# Patient Record
Sex: Female | Born: 1940
Health system: Southern US, Community
[De-identification: ages and names within clinical notes are randomized; demographics above are authoritative.]

## PROBLEM LIST (undated history)

## (undated) DIAGNOSIS — I495 Sick sinus syndrome: Secondary | ICD-10-CM

## (undated) DIAGNOSIS — I5032 Chronic diastolic (congestive) heart failure: Secondary | ICD-10-CM

## (undated) DIAGNOSIS — I10 Essential (primary) hypertension: Secondary | ICD-10-CM

## (undated) DIAGNOSIS — E669 Obesity, unspecified: Secondary | ICD-10-CM

## (undated) DIAGNOSIS — F419 Anxiety disorder, unspecified: Secondary | ICD-10-CM

## (undated) DIAGNOSIS — J45909 Unspecified asthma, uncomplicated: Secondary | ICD-10-CM

## (undated) DIAGNOSIS — E119 Type 2 diabetes mellitus without complications: Secondary | ICD-10-CM

## (undated) DIAGNOSIS — M1990 Unspecified osteoarthritis, unspecified site: Secondary | ICD-10-CM

## (undated) DIAGNOSIS — J42 Unspecified chronic bronchitis: Secondary | ICD-10-CM

## (undated) DIAGNOSIS — J189 Pneumonia, unspecified organism: Secondary | ICD-10-CM

## (undated) DIAGNOSIS — Z9981 Dependence on supplemental oxygen: Secondary | ICD-10-CM

## (undated) DIAGNOSIS — N182 Chronic kidney disease, stage 2 (mild): Secondary | ICD-10-CM

## (undated) DIAGNOSIS — I2510 Atherosclerotic heart disease of native coronary artery without angina pectoris: Secondary | ICD-10-CM

## (undated) DIAGNOSIS — I739 Peripheral vascular disease, unspecified: Secondary | ICD-10-CM

## (undated) DIAGNOSIS — K219 Gastro-esophageal reflux disease without esophagitis: Secondary | ICD-10-CM

## (undated) DIAGNOSIS — Z950 Presence of cardiac pacemaker: Secondary | ICD-10-CM

## (undated) DIAGNOSIS — I6529 Occlusion and stenosis of unspecified carotid artery: Secondary | ICD-10-CM

## (undated) DIAGNOSIS — I480 Paroxysmal atrial fibrillation: Secondary | ICD-10-CM

## (undated) DIAGNOSIS — E785 Hyperlipidemia, unspecified: Secondary | ICD-10-CM

## (undated) DIAGNOSIS — J449 Chronic obstructive pulmonary disease, unspecified: Secondary | ICD-10-CM

## (undated) DIAGNOSIS — R519 Headache, unspecified: Secondary | ICD-10-CM

## (undated) DIAGNOSIS — R51 Headache: Secondary | ICD-10-CM

## (undated) HISTORY — DX: Chronic kidney disease, stage 2 (mild): N18.2

## (undated) HISTORY — DX: Sick sinus syndrome: I49.5

## (undated) HISTORY — DX: Paroxysmal atrial fibrillation: I48.0

## (undated) HISTORY — DX: Type 2 diabetes mellitus without complications: E11.9

## (undated) HISTORY — DX: Atherosclerotic heart disease of native coronary artery without angina pectoris: I25.10

## (undated) HISTORY — DX: Essential (primary) hypertension: I10

## (undated) HISTORY — DX: Occlusion and stenosis of unspecified carotid artery: I65.29

## (undated) HISTORY — DX: Hyperlipidemia, unspecified: E78.5

## (undated) HISTORY — DX: Unspecified asthma, uncomplicated: J45.909

## (undated) HISTORY — DX: Unspecified osteoarthritis, unspecified site: M19.90

## (undated) HISTORY — DX: Peripheral vascular disease, unspecified: I73.9

## (undated) HISTORY — DX: Chronic diastolic (congestive) heart failure: I50.32

## (undated) HISTORY — DX: Obesity, unspecified: E66.9

---

## 2006-07-05 HISTORY — PX: CATARACT EXTRACTION W/ INTRAOCULAR LENS  IMPLANT, BILATERAL: SHX1307

## 2006-09-22 ENCOUNTER — Ambulatory Visit (HOSPITAL_COMMUNITY): Admission: RE | Admit: 2006-09-22 | Discharge: 2006-09-22 | Payer: Self-pay | Admitting: Ophthalmology

## 2006-10-20 ENCOUNTER — Ambulatory Visit (HOSPITAL_COMMUNITY): Admission: RE | Admit: 2006-10-20 | Discharge: 2006-10-20 | Payer: Self-pay | Admitting: Ophthalmology

## 2008-08-27 ENCOUNTER — Inpatient Hospital Stay (HOSPITAL_COMMUNITY): Admission: AD | Admit: 2008-08-27 | Discharge: 2008-08-28 | Payer: Self-pay | Admitting: Cardiology

## 2008-08-27 ENCOUNTER — Ambulatory Visit: Payer: Self-pay | Admitting: Cardiology

## 2008-08-27 ENCOUNTER — Ambulatory Visit: Payer: Self-pay | Admitting: Cardiovascular Disease

## 2008-08-28 ENCOUNTER — Encounter: Payer: Self-pay | Admitting: Cardiology

## 2008-09-10 ENCOUNTER — Ambulatory Visit: Payer: Self-pay | Admitting: Cardiology

## 2008-09-10 ENCOUNTER — Encounter: Payer: Self-pay | Admitting: Cardiology

## 2008-09-10 DIAGNOSIS — E782 Mixed hyperlipidemia: Secondary | ICD-10-CM | POA: Insufficient documentation

## 2008-09-10 DIAGNOSIS — I2510 Atherosclerotic heart disease of native coronary artery without angina pectoris: Secondary | ICD-10-CM

## 2009-03-27 ENCOUNTER — Encounter: Payer: Self-pay | Admitting: Cardiology

## 2010-10-20 LAB — BASIC METABOLIC PANEL
CO2: 26 mEq/L (ref 19–32)
Calcium: 9.5 mg/dL (ref 8.4–10.5)
Chloride: 101 mEq/L (ref 96–112)
Creatinine, Ser: 1.06 mg/dL (ref 0.4–1.2)
Glucose, Bld: 68 mg/dL — ABNORMAL LOW (ref 70–99)
Sodium: 139 mEq/L (ref 135–145)

## 2010-10-20 LAB — CARDIAC PANEL(CRET KIN+CKTOT+MB+TROPI)
CK, MB: 3.1 ng/mL (ref 0.3–4.0)
CK, MB: 3.2 ng/mL (ref 0.3–4.0)
Relative Index: 1.9 (ref 0.0–2.5)
Total CK: 144 U/L (ref 7–177)
Total CK: 160 U/L (ref 7–177)

## 2010-10-20 LAB — GLUCOSE, CAPILLARY
Glucose-Capillary: 266 mg/dL — ABNORMAL HIGH (ref 70–99)
Glucose-Capillary: 80 mg/dL (ref 70–99)
Glucose-Capillary: 83 mg/dL (ref 70–99)
Glucose-Capillary: 95 mg/dL (ref 70–99)

## 2010-10-20 LAB — CBC
Hemoglobin: 12.3 g/dL (ref 12.0–15.0)
MCHC: 34.4 g/dL (ref 30.0–36.0)
RBC: 4.07 MIL/uL (ref 3.87–5.11)
WBC: 6.8 10*3/uL (ref 4.0–10.5)

## 2010-10-20 LAB — LIPID PANEL
Triglycerides: 195 mg/dL — ABNORMAL HIGH (ref <150)
VLDL: 39 mg/dL (ref 0–40)

## 2010-10-20 LAB — PROTIME-INR
INR: 1 (ref 0.00–1.49)
Prothrombin Time: 13.9 seconds (ref 11.6–15.2)

## 2010-11-17 NOTE — Discharge Summary (Signed)
NAME:  Miranda Sexton, LEGLER NO.:  [id number]   MEDICAL RECORD NO.:  [id number]          PATIENT TYPE:  INP   LOCATION:  3729                         FACILITY:  [facility]   PHYSICIAN:  Luis Abed, MD, FACCDATE OF BIRTH:  1940-08-20   DATE OF ADMISSION:  08/27/2008  DATE OF DISCHARGE:  08/28/2008                               DISCHARGE SUMMARY   PRIMARY CARDIOLOGIST:  Jonelle Sidle, MD   PRIMARY MEDICAL DOCTOR:  Doreen Beam, MD.   DISCHARGE DIAGNOSIS:  Nonobstructive coronary artery disease.   SECONDARY DIAGNOSES:  1. Dyslipidemia.  2. Hypertension.  3. Diabetes mellitus.   ALLERGIES:  NKDA.   PROCEDURES PERFORMED DURING THIS HOSPITALIZATION:  The patient had an  EKG on August 27, 2008, which shows sinus bradycardia with a rate of  45 beats per minute, some T-wave flattening in V1, V2, lead I, and aVL.  No significant Q-waves.  No evidence of hypertrophy, left axis  deviation, and 1 premature ventricular complex.  PR 176, QRS 102, and  QTc 407.  An EKG on August 28, 2008, which shows sinus bradycardia  with a rate of 46 beats per minute.  No acute ST-T wave changes.  No  significant Q-waves.  No evidence of hypertrophy, left axis deviation.  PR 174, QRS 122, and QTc 432.  The patient had cardiac catheterization  on August 28, 2008, that showed:  1. Nonobstructive LAD stenosis.  2. No significant left circumflex or right coronary artery stenosis.  3. Normal left ventricular systolic function.  4. Moderate right renal artery stenosis.  5. A 2-D echocardiogram completed on August 28, 2008, results      pending (please see addendum).   BRIEF HISTORY OF PRESENT ILLNESS:  The patient is a pleasant 70 year old  African American female with a 15-year history of diabetes mellitus and  hypertension, but no known history of cardiovascular disease.  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  She states she has  been more fatigued with  activity over the last several weeks.  Last  Friday, August 23, 2008, she began to experience a dull ache in her  left chest associated with burning in her mid sternal area with  radiation up to her left neck and left arm, onset with activity.  She  reported this to be moderate in intensity and caused her to stop  sweeping and sit down.  She ultimately took one of her husband's  sublingual nitroglycerin tablets with relief.  These symptoms lasted  approximately 15 minutes.  Since that time, she has had recurring less  severe episode of similar discomfort, again with activity, and she  scheduled a visit to see Dr. Sherril Croon today.  She was subsequently admitted  to [facility] for evaluation.  Cardiology was consulted, and she  reported that these symptoms are clearly different since last Friday  from prior symptoms she has experienced.  Her activity has been limited.  At the time consultation, her cardiac markers showed an elevated total  CK of 194 with normal CK-MB and relative index and a normal troponin of  less than 0.01.  At the time of consultation, she had not undergone any  recent ischemic evaluation.  So, the patient was transferred to [facility] for cardiac catheterization.  The patient tolerated the  procedure well without any significant complications (please see results  above).  The patient's symptoms have improved somewhat and has been  deemed stable enough for discharge on August 28, 2008.  The patient  also had a 2-D echocardiogram completed at that time of this discharge  secondary to new murmur found on exam; however, due to normal LV  function found on cardiac catheterization, the patient will be  discharged and follow up with Dr. Diona Browner as an outpatient for results  of 2-D echocardiogram.  The patient vital signs remained stable during  the hospital stay.  Most recent vital signs at the time of discharge  with temp 98.5 degrees Fahrenheit, BP 139/70,  pulse 56, respirations 18,  and O2 saturation 97% on room air.  At the time of discharge, the  patient had followup instructions both post cath instructions and  medications instructions given to her in both oral and written form and  she had no questions or concerns that were not addressed.   DISCHARGE LABORATORY DATA:  WBC 6.8, HGB 12.3, HCT 35.7, and platelet  count 327.  Protime 13.9 and INR 1.2.  Sodium 139, potassium 4.0,  chloride 101, CO2 of 26, BUN 16, creatinine 1.06, glucose 68 (patient  asymptomatic).  The patient had 3 sets of negative cardiac enzymes.  Total cholesterol 206, triglycerides 195, HDL 32, LDL 135, and VLDL 39.   FOLLOWUP PLANS AND APPOINTMENTS:  1. The patient was informed to see Dr. Nona Dell, on September 10, 2008, at 2 p.m.  2. The patient has been instructed to follow up with her primary care      doctor, Dr. Sherril Croon, within 2 weeks.   DISCHARGE MEDICATIONS:  No change was made to the patient's medications  taken prior to admission except for the addition of simvastatin 20 mg  p.o. daily.   DURATION OF DISCHARGE ENCOUNTER INCLUDING PHYSICIAN TIME:  45 minutes.      Jarrett Ables, [facility]      Luis Abed, MD, [facility]  Electronically Signed    MS/MEDQ  D:  08/28/2008  T:  08/29/2008  Job:  [id number]   cc:   Jonelle Sidle, MD  Doreen Beam, MD

## 2010-11-20 NOTE — Op Note (Signed)
NAME:  Miranda Sexton, Miranda Sexton NO.:  [id number]   MEDICAL RECORD NO.:  [id number]          PATIENT TYPE:  AMB   LOCATION:  DAY                           FACILITY:  [facility]   PHYSICIAN:  Susanne Greenhouse, MD       DATE OF BIRTH:  01/12/1941   DATE OF PROCEDURE:  10/20/2006  DATE OF DISCHARGE:  10/20/2006                               OPERATIVE REPORT   PREOPERATIVE DIAGNOSIS:  Combined cataract, right eye.   POSTOPERATIVE DIAGNOSIS:  Combined cataract, right eye.   OPERATION PERFORMED:  Phacoemulsification intraocular lens implantation,  right eye.   SURGEON:  Susanne Greenhouse, MD   ANESTHESIA:  Topical with monitored anesthesia care.   OPERATIVE SUMMARY:  In the preoperative holding area, dilating drops and  2% viscous Lidocaine were placed into the right eye.  The patient was  then brought to the operating room where the eye was prepped and draped.  A super sharp blade was used to make a paracentesis port at the  surgeon's 2 o'clock position.  The anterior chamber was then filled with  1% non-preservative Lidocaine solution followed by Amvisc Plus.  A 2.85  mm keratome blade was used to make a clear corneal incision at the  superotemporal limbus.  A cystotome needle was used to create a continue  tear capsulotomy.  Hydrodissection was performed using balanced salt  solution on a fine cannula.  The lens nucleus was then removed using  phacoemulsification with the quadrant cracking technique.  Residual  cortex was removed with irrigation and aspiration.  The capsular bag and  anterior chamber were refilled with Amvisc Plus.  The anterior chamber  intraocular lens was placed into the capsular bag using AMO lens  inserter.  Upon delivering of the leading haptic, the leading haptic was  kinked at its insertion and the trailing haptic was amputated.  At this  point, the incision was widened to approximately 5 mm.  The one haptic  was retrieved and pulled through the wound and the  implant removed from  the anterior chamber through the enlarged wound.  A second implant was  then placed into the capsular bag without difficulty using manual  folding.  The Amvisc Plus was then removed from the anterior chamber and  capsular bag using irrigation and aspiration.  Three individual 10-0  nylon sutures were used to close the temporal incision.  The knots were  rotated to be buried.  Stromal hydration of the wound and paracentesis  ports were performed with balanced salt solution on a fine cannula.  The  wound was tested for leak, which was negative.  There were no operative  complications.  The patient tolerated the procedure well and is returned  to the recovery area in satisfactory condition.   PROSTHETIC DEVICE:  AMO TECNIS posterior chamber lens model [id number],  power of 20.0, serial number [id number].           ______________________________  Susanne Greenhouse, MD     KEH/MEDQ  D:  11/24/2006  T:  11/24/2006  Job:  [id number]

## 2012-07-05 HISTORY — PX: CARDIAC CATHETERIZATION: SHX172

## 2012-10-01 ENCOUNTER — Encounter: Payer: Self-pay | Admitting: Physician Assistant

## 2012-10-02 ENCOUNTER — Encounter: Payer: Self-pay | Admitting: Physician Assistant

## 2012-10-02 DIAGNOSIS — I5031 Acute diastolic (congestive) heart failure: Secondary | ICD-10-CM

## 2012-10-02 DIAGNOSIS — R079 Chest pain, unspecified: Secondary | ICD-10-CM

## 2012-10-06 ENCOUNTER — Encounter: Payer: Self-pay | Admitting: Cardiology

## 2012-10-19 ENCOUNTER — Encounter: Payer: Self-pay | Admitting: Physician Assistant

## 2012-10-26 ENCOUNTER — Encounter: Payer: Self-pay | Admitting: Physician Assistant

## 2012-10-26 ENCOUNTER — Ambulatory Visit (INDEPENDENT_AMBULATORY_CARE_PROVIDER_SITE_OTHER): Payer: No Typology Code available for payment source | Admitting: Physician Assistant

## 2012-10-26 ENCOUNTER — Encounter: Payer: Self-pay | Admitting: *Deleted

## 2012-10-26 VITALS — BP 145/74 | HR 62 | Ht 69.0 in | Wt 238.0 lb

## 2012-10-26 DIAGNOSIS — R0989 Other specified symptoms and signs involving the circulatory and respiratory systems: Secondary | ICD-10-CM | POA: Insufficient documentation

## 2012-10-26 DIAGNOSIS — I10 Essential (primary) hypertension: Secondary | ICD-10-CM | POA: Insufficient documentation

## 2012-10-26 DIAGNOSIS — I5032 Chronic diastolic (congestive) heart failure: Secondary | ICD-10-CM

## 2012-10-26 MED ORDER — ASPIRIN EC 81 MG PO TBEC: 81.0000 mg | DELAYED_RELEASE_TABLET | Freq: Every day | ORAL | Status: DC

## 2012-10-26 MED ORDER — FUROSEMIDE 40 MG PO TABS: 40.0000 mg | ORAL_TABLET | Freq: Every day | ORAL | Status: DC

## 2012-10-26 NOTE — Assessment & Plan Note (Signed)
Followed by primary M.D. Recommended target LDL 100 or less, if feasible.

## 2012-10-26 NOTE — Assessment & Plan Note (Signed)
With evaluate further with carotid Dopplers

## 2012-10-26 NOTE — Assessment & Plan Note (Addendum)
Will proceed with an exercise stress Cardiolite to rule out occult ischemia, as recommended. Patient ruled out for MI with NL troponins. Decrease ASA to 81 mg daily.

## 2012-10-26 NOTE — Progress Notes (Addendum)
Primary Cardiologist: Simona Huh, MD    HPI: Post hospital followup from [facility], status post presentation with CP and mild, acute DHF. Troponins NL. She presented with history of nonobstructive CAD, by prior cardiac catheterization in 2010. An echocardiogram was obtained, indicating normal LVF (EF 60-65%), with diastolic dysfunction, mild MR, and moderate PHTN (RVSP 55-60 mmHg).  Following this result, we recommended further evaluation as an outpatient with a stress test; however, this was never arranged.  Clinically, she reports significant overall improvement with much less dyspnea, and no further PND or orthopnea. She also reports significant decrease in her peripheral edema. Of note, she was discharged on a seven-day course of Lasix 40 mg daily, which has not been refilled. She monitors her weight daily and refrains from added salt. Her weight has remained essentially stable.   Allergies  Allergen Reactions  . Codeine     REACTION: nausea  . Penicillins     REACTION: rash    Current Outpatient Prescriptions  Medication Sig Dispense Refill  . benazepril (LOTENSIN) 40 MG tablet Take 40 mg by mouth daily.      Marland Kitchen glipiZIDE (GLUCOTROL XL) 10 MG 24 hr tablet Take 10 mg by mouth daily.      . insulin aspart protamine- aspart (NOVOLOG 70/30) (70-30) 100 UNIT/ML injection Inject 24-38 Units into the skin 2 (two) times daily with a meal. 38 units in AM and 24 units in PM      . metFORMIN (GLUCOPHAGE) 1000 MG tablet Take 1,000 mg by mouth 2 (two) times daily with a meal.      . omeprazole (PRILOSEC) 20 MG capsule Take 20 mg by mouth daily.      . potassium chloride SA (K-DUR,KLOR-CON) 20 MEQ tablet Take 20 mEq by mouth daily.      . simvastatin (ZOCOR) 40 MG tablet Take 40 mg by mouth every evening.      . triamterene-hydrochlorothiazide (DYAZIDE) 37.5-25 MG per capsule Take 1 capsule by mouth every morning.      . venlafaxine XR (EFFEXOR-XR) 150 MG 24 hr capsule Take 150 mg by mouth daily.        No current facility-administered medications for this visit.    Past Medical History  Diagnosis Date  . CAD (coronary artery disease)     Nonobstructive. Cardiac Cath 08/2008  . PAD (peripheral artery disease)     Moderate right renal artery stenosis 08/2008  . DM (diabetes mellitus)   . HTN (hypertension)   . HLD (hyperlipidemia)   . Obesity   . Asthma   . DJD (degenerative joint disease)   . Atypical chest pain     Normal troponins  . Acute diastolic heart failure   . Sinus bradycardia     first degree atrioventricular block  . Chronic kidney disease     No past surgical history on file.  History   Social History  . Marital Status: Married    Spouse Name: N/A    Number of Children: N/A  . Years of Education: N/A   Occupational History  . Not on file.   Social History Main Topics  . Smoking status: Never Smoker   . Smokeless tobacco: Not on file  . Alcohol Use: No  . Drug Use: No  . Sexually Active: Not on file   Other Topics Concern  . Not on file   Social History Narrative  . No narrative on file    No family history on file.  ROS: no nausea, vomiting;  no fever, chills; no melena, hematochezia; no claudication  PHYSICAL EXAM: BP 145/74  Pulse 62  Ht 5\' 9"  (1.753 m)  Wt 238 lb (107.956 kg)  BMI 35.13 kg/m2 GENERAL: 72 year old female, obese; NAD HEENT: NCAT, PERRLA, EOMI; sclera clear; no xanthelasma NECK: Right carotid bruit; no JVD LUNGS: Diminished breath sounds, no crackles or wheezes CARDIAC: RRR (S1, S2); short, 2/6 systolic ejection murmur; no rubs or gallops ABDOMEN: soft, protuberant EXTREMETIES: 1+ bilateral peripheral edema SKIN: warm/dry; no obvious rash/lesions MUSCULOSKELETAL: no joint deformity NEURO: no focal deficit; NL affect  EKG:    ASSESSMENT & PLAN:  Chronic diastolic heart failure Clinically improved. However, she has since run out of Lasix, which she was not on prior to this hospitalization. We'll renew at same  dose of 40 mg daily. Will check followup metabolic profile today, as well as a BNP level.  CORONARY ATHEROSCLEROSIS NATIVE CORONARY ARTERY Will proceed with an exercise stress Cardiolite to rule out occult ischemia, as recommended. Patient ruled out for MI with NL troponins. Decrease ASA to 81 mg daily.  Carotid bruit With evaluate further with carotid Dopplers  MIXED HYPERLIPIDEMIA Followed by primary M.D. Recommended target LDL 100 or less, if feasible.    Miranda Sexton, PAC

## 2012-10-26 NOTE — Assessment & Plan Note (Signed)
Clinically improved. However, she has since run out of Lasix, which she was not on prior to this hospitalization. We'll renew at same dose of 40 mg daily. Will check followup metabolic profile today, as well as a BNP level.

## 2012-10-26 NOTE — Patient Instructions (Addendum)
   Decrease Aspirin to 81mg  daily  Labs today for BMET, BNP  Lasix 40mg  daily - new sent to pharm  Carotid dopplers   GXT (exercise) cardiolite stress test  Office will contact with results Follow up in  2 weeks

## 2012-10-30 ENCOUNTER — Telehealth: Payer: Self-pay | Admitting: Physician Assistant

## 2012-10-30 NOTE — Telephone Encounter (Signed)
GXT (exercise) cardiolite stress test scheduled for 10-31-2012 [facility]

## 2012-10-30 NOTE — Telephone Encounter (Signed)
No precert required 

## 2012-10-31 DIAGNOSIS — I5032 Chronic diastolic (congestive) heart failure: Secondary | ICD-10-CM

## 2012-11-03 ENCOUNTER — Telehealth: Payer: Self-pay | Admitting: *Deleted

## 2012-11-03 NOTE — Telephone Encounter (Signed)
Message copied by Eustace Moore on Fri Nov 03, 2012  9:33 AM ------      Message from: MCDOWELL, Illene Bolus      Created: Wed Nov 01, 2012 11:43 AM       Reviewed. Somewhat suboptimal heart rate response, 81% MPHR. No definite ST segment abnormalities. Perfusion imaging shows possible soft tissue attenuation, no clear evidence of scar or ischemia, LVEF 65%. If she has been clinically stable, will likely treat medically. ------

## 2012-11-07 NOTE — Telephone Encounter (Signed)
Patients daughter informed

## 2012-11-09 ENCOUNTER — Encounter: Payer: Self-pay | Admitting: *Deleted

## 2012-11-09 ENCOUNTER — Ambulatory Visit (INDEPENDENT_AMBULATORY_CARE_PROVIDER_SITE_OTHER): Payer: No Typology Code available for payment source | Admitting: Physician Assistant

## 2012-11-09 ENCOUNTER — Encounter: Payer: Self-pay | Admitting: Physician Assistant

## 2012-11-09 ENCOUNTER — Encounter (INDEPENDENT_AMBULATORY_CARE_PROVIDER_SITE_OTHER): Payer: No Typology Code available for payment source

## 2012-11-09 ENCOUNTER — Telehealth: Payer: Self-pay | Admitting: Physician Assistant

## 2012-11-09 VITALS — BP 153/56 | HR 50 | Ht 68.0 in | Wt 227.8 lb

## 2012-11-09 DIAGNOSIS — R0989 Other specified symptoms and signs involving the circulatory and respiratory systems: Secondary | ICD-10-CM

## 2012-11-09 DIAGNOSIS — R0609 Other forms of dyspnea: Secondary | ICD-10-CM

## 2012-11-09 DIAGNOSIS — I6529 Occlusion and stenosis of unspecified carotid artery: Secondary | ICD-10-CM

## 2012-11-09 DIAGNOSIS — Z01810 Encounter for preprocedural cardiovascular examination: Secondary | ICD-10-CM

## 2012-11-09 DIAGNOSIS — I10 Essential (primary) hypertension: Secondary | ICD-10-CM

## 2012-11-09 DIAGNOSIS — I5032 Chronic diastolic (congestive) heart failure: Secondary | ICD-10-CM

## 2012-11-09 MED ORDER — AMLODIPINE BESYLATE 5 MG PO TABS: 5.0000 mg | ORAL_TABLET | Freq: Every day | ORAL | Status: DC

## 2012-11-09 NOTE — Progress Notes (Addendum)
Primary Cardiologist: Miranda Huh, MD   HPI: Patient returns for early scheduled followup and review of GXT Cardiolite and carotid Dopplers, ordered at time of last OV.   - Exercise stress Cardiolite, April 29: Low risk study with no definite ischemia; EF 65% (suboptimal, 81% MPHR; 4.6 METS)   - Carotid Dopplers, May 8: 80-99% RICA; 40-59% LICA  Laboratory data, April 24: BUN 22, creatinine 1.2, potassium 3.8. BNP 300  She continues to report significant DOE, with no associated CP. She denies orthopnea, PND, or significant peripheral edema. She has diuresed 9 pounds since last OV.  Allergies  Allergen Reactions  . Codeine     REACTION: nausea  . Penicillins     REACTION: rash    Current Outpatient Prescriptions  Medication Sig Dispense Refill  . aspirin EC 81 MG tablet Take 1 tablet (81 mg total) by mouth daily.      . benazepril (LOTENSIN) 40 MG tablet Take 40 mg by mouth daily.      . furosemide (LASIX) 40 MG tablet Take 1 tablet (40 mg total) by mouth daily.  30 tablet  6  . glipiZIDE (GLUCOTROL XL) 10 MG 24 hr tablet Take 10 mg by mouth daily.      . insulin aspart protamine- aspart (NOVOLOG 70/30) (70-30) 100 UNIT/ML injection Inject 24-38 Units into the skin 2 (two) times daily with a meal. 38 units in AM and 24 units in PM      . metFORMIN (GLUCOPHAGE) 1000 MG tablet Take 1,000 mg by mouth 2 (two) times daily with a meal.      . omeprazole (PRILOSEC) 20 MG capsule Take 20 mg by mouth daily.      . potassium chloride SA (K-DUR,KLOR-CON) 20 MEQ tablet Take 20 mEq by mouth daily.      . simvastatin (ZOCOR) 40 MG tablet Take 40 mg by mouth every evening.      . triamterene-hydrochlorothiazide (DYAZIDE) 37.5-25 MG per capsule Take 1 capsule by mouth every morning.      . venlafaxine XR (EFFEXOR-XR) 150 MG 24 hr capsule Take 150 mg by mouth daily.       No current facility-administered medications for this visit.    Past Medical History  Diagnosis Date  . CAD (coronary  artery disease)     Nonobstructive. Cardiac Cath 08/2008  . PAD (peripheral artery disease)     Moderate right renal artery stenosis 08/2008  . DM (diabetes mellitus)   . HTN (hypertension)   . HLD (hyperlipidemia)   . Obesity   . Asthma   . DJD (degenerative joint disease)   . Atypical chest pain     Normal troponins  . Acute diastolic heart failure   . Sinus bradycardia     first degree atrioventricular block  . Chronic kidney disease     No past surgical history on file.  History   Social History  . Marital Status: Married    Spouse Name: N/A    Number of Children: N/A  . Years of Education: N/A   Occupational History  . Not on file.   Social History Main Topics  . Smoking status: Never Smoker   . Smokeless tobacco: Not on file  . Alcohol Use: No  . Drug Use: No  . Sexually Active: Not on file   Other Topics Concern  . Not on file   Social History Narrative  . No narrative on file    No family history on file.  ROS: no  nausea, vomiting; no fever, chills; no melena, hematochezia; no claudication  PHYSICAL EXAM: BP 153/56  Pulse 50  Ht 5\' 8"  (1.727 m)  Wt 227 lb 12.8 oz (103.329 kg)  BMI 34.64 kg/m2  SpO2 98% GENERAL: 72 year old female, obese; NAD  HEENT: NCAT, PERRLA, EOMI; sclera clear; no xanthelasma  NECK: Right carotid bruit; no JVD  LUNGS: Diminished breath sounds, no crackles or wheezes  CARDIAC: RRR (S1, S2); short, 2/6 systolic ejection murmur; no rubs or gallops  ABDOMEN: soft, protuberant  EXTREMETIES: Palpable bilateral femoral pulses, no significant bruits; trace peripheral edema  SKIN: warm/dry; no obvious rash/lesions  MUSCULOSKELETAL: no joint deformity  NEURO: no focal deficit; NL affect    EKG:    ASSESSMENT & PLAN:  Carotid bruit Duplex imaging from earlier today reveals 80-99% RICA stenosis, with mild left-sided disease. Will refer to [facility] group in [place] for formal evaluation regarding treatment options.  Exertional  dyspnea DOE remains essentially her salient complaint. She denies any exertional CP. Results of the recent GXT Cardiolite were reviewed, which, although suboptimal, was negative for definite ischemia. She has known NL LVF, but with diastolic dysfunction and moderate PHTN. Following extensive discussion with her and her daughter, we have agreed to proceed with a repeat cardiac catheterization to rule out significant CAD, since her previous study nonobstructive in 2010. Of note, she does have high probability for significant disease progression, given current finding of carotid artery disease, previously documented right RAS in 2010 and IDDM. We will arrange to have this done as an elective R/L heart catheterization, so as also to better define the severity of her PHTN. The risks/benefits were reviewed, and plan was approved by Dr. Diona Browner.  Chronic diastolic heart failure Continue current diuretic regimen and check followup labs, including BNP level.  HTN (hypertension) Will add amlodipine 5 mg daily for more aggressive BP management.     Gene Floris Neuhaus, PAC

## 2012-11-09 NOTE — Telephone Encounter (Signed)
Pt has Pyramid Today PFFS, per Nita, no precert required.

## 2012-11-09 NOTE — Assessment & Plan Note (Signed)
Duplex imaging from earlier today reveals 80-99% RICA stenosis, with mild left-sided disease. Will refer to [facility] group in [place] for formal evaluation regarding treatment options.

## 2012-11-09 NOTE — Assessment & Plan Note (Addendum)
Miranda Sexton remains essentially her salient complaint. She denies any exertional CP. Results of the recent GXT Cardiolite were reviewed, which, although suboptimal, was negative for definite ischemia. She has known NL LVF, but with diastolic dysfunction and moderate PHTN. Following extensive discussion with her and her daughter, we have agreed to proceed with a repeat cardiac catheterization to rule out significant CAD, since her previous study nonobstructive in 2010. Of note, she does have high probability for significant disease progression, given current finding of carotid artery disease, previously documented right RAS in 2010 and IDDM. We will arrange to have this done as an elective R/L heart catheterization, so as also to better define the severity of her PHTN. The risks/benefits were reviewed, and plan was approved by Dr. Diona Browner.

## 2012-11-09 NOTE — Patient Instructions (Signed)
   Right & left JV heart cath - see info sheet given  Referral to [facility] V[facility] for evaluation of carotid arteries  Pre cath labs & chest x-ray today   Begin Norvasc 5mg  daily Continue all other current medications. Follow up will be given after procedure above

## 2012-11-09 NOTE — Assessment & Plan Note (Signed)
Continue current diuretic regimen and check followup labs, including BNP level.

## 2012-11-09 NOTE — Assessment & Plan Note (Signed)
Will add amlodipine 5 mg daily for more aggressive BP management.

## 2012-11-09 NOTE — Telephone Encounter (Signed)
R & L JV cath - Thursday, 5/15 - 9:30 - Shirlee Latch   Checking percert

## 2012-11-10 ENCOUNTER — Other Ambulatory Visit: Payer: Self-pay | Admitting: Physician Assistant

## 2012-11-14 ENCOUNTER — Other Ambulatory Visit: Payer: Self-pay

## 2012-11-14 ENCOUNTER — Encounter: Payer: Self-pay | Admitting: Vascular Surgery

## 2012-11-16 ENCOUNTER — Encounter (HOSPITAL_BASED_OUTPATIENT_CLINIC_OR_DEPARTMENT_OTHER)
Admission: RE | Disposition: A | Discharge status: Home / Self Care | Payer: Self-pay | Source: Ambulatory Visit | Attending: Cardiology

## 2012-11-16 ENCOUNTER — Inpatient Hospital Stay (HOSPITAL_BASED_OUTPATIENT_CLINIC_OR_DEPARTMENT_OTHER)
Admission: RE | Admit: 2012-11-16 | Discharge: 2012-11-16 | Disposition: A | Discharge status: Home / Self Care | Payer: No Typology Code available for payment source | Source: Ambulatory Visit | Attending: Cardiology | Admitting: Cardiology

## 2012-11-16 DIAGNOSIS — Z794 Long term (current) use of insulin: Secondary | ICD-10-CM | POA: Insufficient documentation

## 2012-11-16 DIAGNOSIS — I6529 Occlusion and stenosis of unspecified carotid artery: Secondary | ICD-10-CM | POA: Insufficient documentation

## 2012-11-16 DIAGNOSIS — R0989 Other specified symptoms and signs involving the circulatory and respiratory systems: Secondary | ICD-10-CM | POA: Insufficient documentation

## 2012-11-16 DIAGNOSIS — N189 Chronic kidney disease, unspecified: Secondary | ICD-10-CM | POA: Insufficient documentation

## 2012-11-16 DIAGNOSIS — I5032 Chronic diastolic (congestive) heart failure: Secondary | ICD-10-CM | POA: Insufficient documentation

## 2012-11-16 DIAGNOSIS — R0609 Other forms of dyspnea: Secondary | ICD-10-CM

## 2012-11-16 DIAGNOSIS — E119 Type 2 diabetes mellitus without complications: Secondary | ICD-10-CM | POA: Insufficient documentation

## 2012-11-16 DIAGNOSIS — I129 Hypertensive chronic kidney disease with stage 1 through stage 4 chronic kidney disease, or unspecified chronic kidney disease: Secondary | ICD-10-CM | POA: Insufficient documentation

## 2012-11-16 LAB — POCT I-STAT 3, ART BLOOD GAS (G3+)
Bicarbonate: 27.6 mEq/L — ABNORMAL HIGH (ref 20.0–24.0)
O2 Saturation: 90 %
TCO2: 29 mmol/L (ref 0–100)
pCO2 arterial: 48.3 mmHg — ABNORMAL HIGH (ref 35.0–45.0)
pH, Arterial: 7.366 (ref 7.350–7.450)

## 2012-11-16 LAB — POCT I-STAT 3, VENOUS BLOOD GAS (G3P V)
Bicarbonate: 27.5 mEq/L — ABNORMAL HIGH (ref 20.0–24.0)
O2 Saturation: 58 %
TCO2: 29 mmol/L (ref 0–100)
pH, Ven: 7.321 — ABNORMAL HIGH (ref 7.250–7.300)

## 2012-11-16 SURGERY — JV LEFT AND RIGHT HEART CATHETERIZATION WITH CORONARY ANGIOGRAM
Anesthesia: Moderate Sedation

## 2012-11-16 MED ORDER — ASPIRIN 81 MG PO CHEW
324.0000 mg | CHEWABLE_TABLET | ORAL | Status: AC
  Administered 2012-11-16: 243 mg via ORAL

## 2012-11-16 MED ORDER — ONDANSETRON HCL 4 MG/2ML IJ SOLN: 4.0000 mg | Freq: Four times a day (QID) | INTRAMUSCULAR | Status: DC | PRN

## 2012-11-16 MED ORDER — ACETAMINOPHEN 325 MG PO TABS: 650.0000 mg | ORAL_TABLET | ORAL | Status: DC | PRN

## 2012-11-16 MED ORDER — SODIUM CHLORIDE 0.9 % IV SOLN: INTRAVENOUS | Status: AC

## 2012-11-16 MED ORDER — SODIUM CHLORIDE 0.9 % IV SOLN: 250.0000 mL | INTRAVENOUS | Status: DC | PRN

## 2012-11-16 MED ORDER — SODIUM CHLORIDE 0.9 % IJ SOLN: 3.0000 mL | INTRAMUSCULAR | Status: DC | PRN

## 2012-11-16 MED ORDER — SODIUM CHLORIDE 0.9 % IV SOLN: INTRAVENOUS | Status: DC

## 2012-11-16 MED ORDER — SODIUM CHLORIDE 0.9 % IJ SOLN: 3.0000 mL | Freq: Two times a day (BID) | INTRAMUSCULAR | Status: DC

## 2012-11-16 NOTE — OR Nursing (Signed)
Discharge instructions reviewed and signed, pt stated understanding, ambulated in hall without difficulty, site level 0, transported to daughter's call

## 2012-11-16 NOTE — CV Procedure (Signed)
   Cardiac Catheterization Procedure Note  Name: TYKISHA AREOLA MRN: [id number] DOB: [birth year]  Procedure: Right Heart Cath, Left Heart Cath, Selective Coronary Angiography, LV angiography  Indication: Exertional dyspnea, pulmonary HTN by echocardiogram.    Procedural Details: The right groin was prepped, draped, and anesthetized with 1% lidocaine. Using the modified Seldinger technique a 4 French sheath was placed in the right femoral artery and a 7 French sheath was placed in the right femoral vein. A Swan-Ganz catheter was used for the right heart catheterization. Standard protocol was followed for recording of right heart pressures and sampling of oxygen saturations. Fick cardiac output was calculated. Standard Judkins catheters were used for selective coronary angiography and left ventriculography. There were no immediate procedural complications. The patient was transferred to the post catheterization recovery area for further monitoring.  Procedural Findings: Hemodynamics (mmHg) RA mean 3 RV 39/6 PA 34/17 PCWP mean 6 LV 133/14 AO 122/77  Oxygen saturations: PA 58% AO 90%  Cardiac Output (Fick) 4.9  Cardiac Index (Fick) 2.3   Coronary angiography: Coronary dominance: left  Left mainstem: No angiographic CAD.   Left anterior descending (LAD): Luminal irregularities.  Left circumflex (LCx): Dominant vessel, no angiographic CAD.   Right coronary artery (RCA): Small nondominant vessel.   Left ventriculography: Left ventricular systolic function is normal, LVEF is estimated at 60-65%, there is no significant mitral regurgitation   Final Conclusions:  RHC showed that left and right heart filling pressures are not significantly elevated.  I do not note pulmonary hypertension (echo suggested pulmonary HTN).  No significant CAD.  Normal LV systolic function.  I cannot explain the patient's dyspnea from the findings of this procedure.    Recommendations:  Followup in [place]  office in 2 wks.   Marca Ancona 11/16/2012, 10:31 AM

## 2012-11-16 NOTE — Interval H&P Note (Signed)
History and Physical Interval Note:  11/16/2012 9:58 AM  Miranda Sexton  has presented today for surgery, with the diagnosis of SOB  The various methods of treatment have been discussed with the patient and family. After consideration of risks, benefits and other options for treatment, the patient has consented to  Procedure(s): JV LEFT AND RIGHT HEART CATHETERIZATION WITH CORONARY ANGIOGRAM (N/A) as a surgical intervention .  The patient's history has been reviewed, patient examined, no change in status, stable for surgery.  I have reviewed the patient's chart and labs.  Questions were answered to the patient's satisfaction.     Dalton [employer]

## 2012-11-16 NOTE — H&P (View-Only) (Signed)
Primary Cardiologist: Sam McDowell, MD   HPI: Patient returns for early scheduled followup and review of GXT Cardiolite and carotid Dopplers, ordered at time of last OV.   - Exercise stress Cardiolite, April 29: Low risk study with no definite ischemia; EF 65% (suboptimal, 81% MPHR; 4.6 METS)   - Carotid Dopplers, May 8: 80-99% RICA; 40-59% LICA  Laboratory data, April 24: BUN 22, creatinine 1.2, potassium 3.8. BNP 300  She continues to report significant DOE, with no associated CP. She denies orthopnea, PND, or significant peripheral edema. She has diuresed 9 pounds since last OV.  Allergies  Allergen Reactions  . Codeine     REACTION: nausea  . Penicillins     REACTION: rash    Current Outpatient Prescriptions  Medication Sig Dispense Refill  . aspirin EC 81 MG tablet Take 1 tablet (81 mg total) by mouth daily.      . benazepril (LOTENSIN) 40 MG tablet Take 40 mg by mouth daily.      . furosemide (LASIX) 40 MG tablet Take 1 tablet (40 mg total) by mouth daily.  30 tablet  6  . glipiZIDE (GLUCOTROL XL) 10 MG 24 hr tablet Take 10 mg by mouth daily.      . insulin aspart protamine- aspart (NOVOLOG 70/30) (70-30) 100 UNIT/ML injection Inject 24-38 Units into the skin 2 (two) times daily with a meal. 38 units in AM and 24 units in PM      . metFORMIN (GLUCOPHAGE) 1000 MG tablet Take 1,000 mg by mouth 2 (two) times daily with a meal.      . omeprazole (PRILOSEC) 20 MG capsule Take 20 mg by mouth daily.      . potassium chloride SA (K-DUR,KLOR-CON) 20 MEQ tablet Take 20 mEq by mouth daily.      . simvastatin (ZOCOR) 40 MG tablet Take 40 mg by mouth every evening.      . triamterene-hydrochlorothiazide (DYAZIDE) 37.5-25 MG per capsule Take 1 capsule by mouth every morning.      . venlafaxine XR (EFFEXOR-XR) 150 MG 24 hr capsule Take 150 mg by mouth daily.       No current facility-administered medications for this visit.    Past Medical History  Diagnosis Date  . CAD (coronary  artery disease)     Nonobstructive. Cardiac Cath 08/2008  . PAD (peripheral artery disease)     Moderate right renal artery stenosis 08/2008  . DM (diabetes mellitus)   . HTN (hypertension)   . HLD (hyperlipidemia)   . Obesity   . Asthma   . DJD (degenerative joint disease)   . Atypical chest pain     Normal troponins  . Acute diastolic heart failure   . Sinus bradycardia     first degree atrioventricular block  . Chronic kidney disease     No past surgical history on file.  History   Social History  . Marital Status: Married    Spouse Name: N/A    Number of Children: N/A  . Years of Education: N/A   Occupational History  . Not on file.   Social History Main Topics  . Smoking status: Never Smoker   . Smokeless tobacco: Not on file  . Alcohol Use: No  . Drug Use: No  . Sexually Active: Not on file   Other Topics Concern  . Not on file   Social History Narrative  . No narrative on file    No family history on file.  ROS: no   nausea, vomiting; no fever, chills; no melena, hematochezia; no claudication  PHYSICAL EXAM: BP 153/56  Pulse 50  Ht 5' 8" (1.727 m)  Wt 227 lb 12.8 oz (103.329 kg)  BMI 34.64 kg/m2  SpO2 98% GENERAL: 71-year-old female, obese; NAD  HEENT: NCAT, PERRLA, EOMI; sclera clear; no xanthelasma  NECK: Right carotid bruit; no JVD  LUNGS: Diminished breath sounds, no crackles or wheezes  CARDIAC: RRR (S1, S2); short, 2/6 systolic ejection murmur; no rubs or gallops  ABDOMEN: soft, protuberant  EXTREMETIES: Palpable bilateral femoral pulses, no significant bruits; trace peripheral edema  SKIN: warm/dry; no obvious rash/lesions  MUSCULOSKELETAL: no joint deformity  NEURO: no focal deficit; NL affect    EKG:    ASSESSMENT & PLAN:  Carotid bruit Duplex imaging from earlier today reveals 80-99% RICA stenosis, with mild left-sided disease. Will refer to [facility] group in [place] for formal evaluation regarding treatment options.  Exertional  dyspnea DOE remains essentially her salient complaint. She denies any exertional CP. Results of the recent GXT Cardiolite were reviewed, which, although suboptimal, was negative for definite ischemia. She has known NL LVF, but with diastolic dysfunction and moderate PHTN. Following extensive discussion with her and her daughter, we have agreed to proceed with a repeat cardiac catheterization to rule out significant CAD, since her previous study nonobstructive in 2010. Of note, she does have high probability for significant disease progression, given current finding of carotid artery disease, previously documented right RAS in 2010 and IDDM. We will arrange to have this done as an elective R/L heart catheterization, so as also to better define the severity of her PHTN. The risks/benefits were reviewed, and plan was approved by Dr. McDowell.  Chronic diastolic heart failure Continue current diuretic regimen and check followup labs, including BNP level.  HTN (hypertension) Will add amlodipine 5 mg daily for more aggressive BP management.     Gene Amariah Kierstead, PAC  

## 2012-11-16 NOTE — OR Nursing (Signed)
Tegaderm dreswing applied, site level 0, bedrest begins at 1045

## 2012-12-04 ENCOUNTER — Encounter: Payer: Self-pay | Admitting: Vascular Surgery

## 2012-12-05 ENCOUNTER — Ambulatory Visit (INDEPENDENT_AMBULATORY_CARE_PROVIDER_SITE_OTHER): Payer: No Typology Code available for payment source | Admitting: Vascular Surgery

## 2012-12-05 ENCOUNTER — Other Ambulatory Visit: Payer: Self-pay | Admitting: *Deleted

## 2012-12-05 ENCOUNTER — Encounter (HOSPITAL_COMMUNITY)
Admission: RE | Admit: 2012-12-05 | Discharge: 2012-12-05 | Disposition: A | Discharge status: Home / Self Care | Payer: No Typology Code available for payment source | Source: Ambulatory Visit | Attending: Vascular Surgery | Admitting: Vascular Surgery

## 2012-12-05 ENCOUNTER — Encounter: Payer: Self-pay | Admitting: Vascular Surgery

## 2012-12-05 ENCOUNTER — Encounter (HOSPITAL_COMMUNITY): Payer: Self-pay

## 2012-12-05 ENCOUNTER — Other Ambulatory Visit (INDEPENDENT_AMBULATORY_CARE_PROVIDER_SITE_OTHER): Payer: No Typology Code available for payment source | Admitting: Vascular Surgery

## 2012-12-05 DIAGNOSIS — I6529 Occlusion and stenosis of unspecified carotid artery: Secondary | ICD-10-CM

## 2012-12-05 DIAGNOSIS — M79609 Pain in unspecified limb: Secondary | ICD-10-CM | POA: Insufficient documentation

## 2012-12-05 HISTORY — DX: Gastro-esophageal reflux disease without esophagitis: K21.9

## 2012-12-05 LAB — CBC
HCT: 39.8 % (ref 36.0–46.0)
MCH: 28.3 pg (ref 26.0–34.0)
MCHC: 33.4 g/dL (ref 30.0–36.0)
MCV: 84.7 fL (ref 78.0–100.0)
Platelets: 329 10*3/uL (ref 150–400)
RDW: 14.8 % (ref 11.5–15.5)

## 2012-12-05 LAB — URINALYSIS, ROUTINE W REFLEX MICROSCOPIC
Bilirubin Urine: NEGATIVE
Hgb urine dipstick: NEGATIVE
Ketones, ur: NEGATIVE mg/dL
Protein, ur: NEGATIVE mg/dL
Specific Gravity, Urine: 1.012 (ref 1.005–1.030)
Urobilinogen, UA: 0.2 mg/dL (ref 0.0–1.0)

## 2012-12-05 LAB — COMPREHENSIVE METABOLIC PANEL
Albumin: 3.5 g/dL (ref 3.5–5.2)
BUN: 29 mg/dL — ABNORMAL HIGH (ref 6–23)
Calcium: 9.6 mg/dL (ref 8.4–10.5)
Creatinine, Ser: 1.44 mg/dL — ABNORMAL HIGH (ref 0.50–1.10)
GFR calc Af Amer: 41 mL/min — ABNORMAL LOW (ref >90)
Glucose, Bld: 223 mg/dL — ABNORMAL HIGH (ref 70–99)
Total Protein: 7.8 g/dL (ref 6.0–8.3)

## 2012-12-05 LAB — TYPE AND SCREEN
ABO/RH(D): O POS
Antibody Screen: NEGATIVE

## 2012-12-05 LAB — SURGICAL PCR SCREEN: MRSA, PCR: NEGATIVE

## 2012-12-05 LAB — PROTIME-INR
INR: 0.97 (ref 0.00–1.49)
Prothrombin Time: 12.8 seconds (ref 11.6–15.2)

## 2012-12-05 NOTE — Pre-Procedure Instructions (Signed)
TYSHANA NISHIDA  12/05/2012   Your procedure is scheduled on:  FRIDAY, June 6TH.  Report to [facility] at 5:30AM.  Call this number if you have problems the morning of surgery: [phone]   Remember:   Do not eat food or drink liquids after midnight.   Take these medicines the morning of surgery with A SIP OF WATER: amLODipine (NORVASC),omeprazole (PRILOSEC),venlafaxine XR (EFFEXOR-XR)        Do not wear jewelry, make-up or nail polish.  Do not wear lotions, powders, or perfumes. You may wear deodorant.  Do not shave 48 hours prior to surgery.   Do not bring valuables to the hospital.  [facility] is not responsible or any belongings or valuables.  Contacts, dentures or bridgework may not be worn into surgery.  Leave suitcase in the car. After surgery it may be brought to your room.  For patients admitted to the hospital, checkout time is 11:00 AM the day of discharge.   Patients discharged the day of surgery will not be allowed to drive home.  Name and phone number of your driver: -  Special Instructions: Shower using CHG 2 nights before surgery and the night before surgery.  If you shower the day of surgery use CHG.  Use special wash - you have one bottle of CHG for all showers.  You should use approximately 1/3 of the bottle for each shower.   Please read over the following fact sheets that you were given: Pain Booklet, Coughing and Deep Breathing, Blood Transfusion Information and Surgical Site Infection Prevention

## 2012-12-05 NOTE — Progress Notes (Signed)
[facility] [place]   Patient name: Miranda Sexton MRN: [id number] DOB: 1940/08/09 Sex: female   Referred by: Myrtis Ser  Reason for referral:  Chief Complaint  Patient presents with  . New Evaluation    Carotid/ Right Ref. by Dr. Jerral Bonito   C/O  Pain in Right leg with walking and lying flat, duration 6+ months.  Weakness in right arm and leg, duration , 6+ mo.  Swelling right leg, [facility]. 2 days, April  2014.    HISTORY OF PRESENT ILLNESS: Patient was found to have a symptomatic carotid bruit and further workup revealed severe right internal carotid artery stenosis. She specifically denies any prior history of amaurosis fugax, transient ischemic attack or stroke. She does have some symptoms of soreness in her right thigh and calf with walking and sewn to be claudication is not supported by physical exam. She is seen today for consideration of carotid endarterectomy  Past Medical History  Diagnosis Date  . CAD (coronary artery disease)     Nonobstructive. Cardiac Cath 08/2008  . PAD (peripheral artery disease)     Moderate right renal artery stenosis 08/2008  . DM (diabetes mellitus)   . HTN (hypertension)   . HLD (hyperlipidemia)   . Obesity   . Asthma   . DJD (degenerative joint disease)   . Atypical chest pain     Normal troponins  . Acute diastolic heart failure   . Sinus bradycardia     first degree atrioventricular block  . Chronic kidney disease   . Other dyspnea and respiratory abnormality   . Peripheral arterial disease   . Carotid artery occlusion     RIGHT  bruit  . Renal artery stenosis Feb. 2010    Moderate  . CHF (congestive heart failure)     Past Surgical History  Procedure Laterality Date  . Cardiac catheterization  Feb. 2010    Nonobstructive    History   Social History  . Marital Status: Married    Spouse Name: N/A    Number of Children: N/A  . Years of Education: N/A   Occupational History  . Not on file.    Social History Main Topics  . Smoking status: Never Smoker   . Smokeless tobacco: Never Used  . Alcohol Use: No  . Drug Use: No  . Sexually Active: Not on file   Other Topics Concern  . Not on file   Social History Narrative  . No narrative on file    Family History  Problem Relation Age of Onset  . Cancer Mother     Stomach  . Deep vein thrombosis Mother   . Diabetes Mother   . Hypertension Mother   . Heart disease Mother     Bleeding problem  . Hypertension Father   . Diabetes Sister   . Hyperlipidemia Sister   . Hypertension Sister   . Heart disease Sister     Bleeding problem  . Diabetes Brother   . Hyperlipidemia Brother   . Hypertension Brother     Allergies as of 12/05/2012 - Review Complete 12/05/2012  Allergen Reaction Noted  . Codeine  09/10/2008  . Penicillins  09/10/2008    Current Outpatient Prescriptions on File Prior to Visit  Medication Sig Dispense Refill  . amLODipine (NORVASC) 5 MG tablet Take 1 tablet (5 mg total) by mouth daily.  30 tablet  6  . aspirin EC 81 MG tablet Take 1 tablet (81 mg total) by  mouth daily.      . benazepril (LOTENSIN) 40 MG tablet Take 40 mg by mouth daily.      . furosemide (LASIX) 40 MG tablet Take 1 tablet (40 mg total) by mouth daily.  30 tablet  6  . glipiZIDE (GLUCOTROL XL) 10 MG 24 hr tablet Take 10 mg by mouth daily.      . insulin aspart protamine- aspart (NOVOLOG 70/30) (70-30) 100 UNIT/ML injection Inject 24-38 Units into the skin 2 (two) times daily with a meal. 38 units in AM and 24 units in PM      . metFORMIN (GLUCOPHAGE) 1000 MG tablet Take 1,000 mg by mouth 2 (two) times daily with a meal.      . omeprazole (PRILOSEC) 20 MG capsule Take 20 mg by mouth daily.      . potassium chloride SA (K-DUR,KLOR-CON) 20 MEQ tablet Take 20 mEq by mouth daily.      . simvastatin (ZOCOR) 40 MG tablet Take 40 mg by mouth every evening.      . triamterene-hydrochlorothiazide (DYAZIDE) 37.5-25 MG per capsule Take 1  capsule by mouth every morning.      . venlafaxine XR (EFFEXOR-XR) 150 MG 24 hr capsule Take 150 mg by mouth daily.       No current facility-administered medications on file prior to visit.     REVIEW OF SYSTEMS:  Positives indicated with an "X"  CARDIOVASCULAR:  [ ]  chest pain   [ ]  chest pressure   [ ]  palpitations   [x]  orthopnea   [x ] dyspnea on exertion   x[ ]  claudication   [ ]  rest pain   [ ]  DVT   [ ]  phlebitis PULMONARY:   [ ]  productive cough   [x ] asthma   [x ] wheezing NEUROLOGIC:   [x weakness  [ ]  paresthesias  [ ]  aphasia  [ ]  amaurosis  [x ] dizziness HEMATOLOGIC:   [ ]  bleeding problems   [ ]  clotting disorders MUSCULOSKELETAL:  [ ]  joint pain   [ ]  joint swelling GASTROINTESTINAL: [ ]   blood in stool  [ ]   hematemesis GENITOURINARY:  [ ]   dysuria  [ ]   hematuria PSYCHIATRIC:  [ ]  history of major depression INTEGUMENTARY:  [ ]  rashes  [ ]  ulcers CONSTITUTIONAL:  [ ]  fever   [ ]  chills  PHYSICAL EXAMINATION:  General: The patient is a well-nourished female, in no acute distress. Vital signs are BP 117/71  Pulse 57  Resp 16  Ht 5' 7.5" (1.715 m)  Wt 220 lb (99.791 kg)  BMI 33.93 kg/m2  SpO2 97% Pulmonary: There is a good air exchange bilaterally without wheezing or rales. Abdomen: Soft and non-tender with normal pitch bowel sounds. Musculoskeletal: There are no major deformities.  There is no significant extremity pain. Neurologic: No focal weakness or paresthesias are detected, Skin: There are no ulcer or rashes noted. Psychiatric: The patient has normal affect. Cardiovascular: There is a regular rate and rhythm without significant murmur appreciated. Carotid artery with soft right carotid bruit membrane left Patient has had normal radial 2+, 2+ femoral and 2+ popliteal pulses bilaterally. Does have some swelling I do not appreciate pedal pulses  [facility] Vascular Lab Studies:  Ordered and Independently Reviewed he did have reimaging of her right internal  carotid and carotid bifurcation determine if she was a candidate for endarterectomy based on duplex. She does have severe stenosis as seen an outlying study. Internal carotid becomes normal above  the bifurcation.  Impression and Plan:  Severe asymptomatic right internal carotid artery stenosis. The patient is left-handed. I did explain this is her dominant hemisphere. Risk for left sided paralysis. I have recommended right carotid endarterectomy for reduction of stroke risk. I explained the procedure to include about 1 or infection risk of stroke with surgery. I did explain the expected one-day hospitalization. She wished to proceed with surgery on 12/08/2012. He did have a recent cardiac catheterization without any evidence of stenosis    Aundre Hietala [facility] [place] Office: [phone]

## 2012-12-06 ENCOUNTER — Ambulatory Visit (INDEPENDENT_AMBULATORY_CARE_PROVIDER_SITE_OTHER): Payer: No Typology Code available for payment source | Admitting: Physician Assistant

## 2012-12-06 ENCOUNTER — Encounter: Payer: Self-pay | Admitting: Physician Assistant

## 2012-12-06 ENCOUNTER — Encounter (HOSPITAL_COMMUNITY): Payer: Self-pay | Admitting: Pharmacy Technician

## 2012-12-06 VITALS — BP 108/69 | HR 69 | Ht 66.0 in | Wt 222.1 lb

## 2012-12-06 DIAGNOSIS — I5032 Chronic diastolic (congestive) heart failure: Secondary | ICD-10-CM

## 2012-12-06 DIAGNOSIS — R0609 Other forms of dyspnea: Secondary | ICD-10-CM

## 2012-12-06 DIAGNOSIS — I6529 Occlusion and stenosis of unspecified carotid artery: Secondary | ICD-10-CM

## 2012-12-06 DIAGNOSIS — E782 Mixed hyperlipidemia: Secondary | ICD-10-CM

## 2012-12-06 DIAGNOSIS — I10 Essential (primary) hypertension: Secondary | ICD-10-CM

## 2012-12-06 MED ORDER — ATORVASTATIN CALCIUM 40 MG PO TABS: 40.0000 mg | ORAL_TABLET | Freq: Every day | ORAL | Status: DC

## 2012-12-06 NOTE — Assessment & Plan Note (Addendum)
The results of the recent R/L heart catheterization were reviewed with the patient, which yielded no cardiac etiology for her significant DOE. Moreover, it also did not reveal evidence of PHTN, which had been been recently assessed as moderate by echocardiography. Therefore, I recommend formal pulmonary evaluation with Dr. Cherie Ouch in the next several weeks, after allowing recovery from her R CEA, scheduled for the end of this week.

## 2012-12-06 NOTE — Assessment & Plan Note (Signed)
Much improved, following recent addition of amlodipine.

## 2012-12-06 NOTE — Patient Instructions (Addendum)
   Referral to Pulmonology - Dr. Cherie Ouch  Stop Zocor (Simvastatin)  Change to Lipitor 40mg  every evening  Labs for fasting lipid & liver in 12 weeks - will send reminder in mail Follow up in  3 months

## 2012-12-06 NOTE — Assessment & Plan Note (Signed)
Patient has diuresed 5 pounds since last OV. Recent followup labs stable. Continue current diuretic regimen.

## 2012-12-06 NOTE — Assessment & Plan Note (Signed)
Patient is now on amlodipine. Therefore, will discontinue simvastatin and substituted with Lipitor 40 mg daily, for moderate intensity statin therapy, in light of her current carotid artery disease, and previously documented renal artery disease. Reassess lipid status in 12 weeks.

## 2012-12-06 NOTE — Progress Notes (Signed)
Primary Cardiologist: Simona Huh, MD   HPI: Patient returns to discuss results of recent elective R/L heart catheterization, arranged at time of recent OV, for ongoing evaluation of persistent significant DOE, but with no exertional CP. She had had a recent low risk GXT Cardiolite test, with NL LVF. She also had evidence of moderate PHTN, by recent echocardiography.   - R/L heart catheterization, May 15: No significant CAD; EF 60-65%; no significant elevation of left/right heart filling pressures  The results of the recent cardiac catheterization were reviewed with the patient. She denies any complications of the R groin incision site.  Patient has since been seen by Dr. Gretta Began in [facility], for evaluation of 80-99% RICA stenosis, by duplex imaging at time of last OV. She is scheduled to undergo R CEA in 2 days, on June 6.  Allergies  Allergen Reactions  . Codeine     REACTION: nausea  . Penicillins     REACTION: rash    Current Outpatient Prescriptions  Medication Sig Dispense Refill  . amLODipine (NORVASC) 5 MG tablet Take 1 tablet (5 mg total) by mouth daily.  30 tablet  6  . aspirin EC 81 MG tablet Take 1 tablet (81 mg total) by mouth daily.      . benazepril (LOTENSIN) 40 MG tablet Take 40 mg by mouth daily.      . furosemide (LASIX) 40 MG tablet Take 1 tablet (40 mg total) by mouth daily.  30 tablet  6  . glipiZIDE (GLUCOTROL XL) 10 MG 24 hr tablet Take 10 mg by mouth daily.      . insulin aspart protamine- aspart (NOVOLOG 70/30) (70-30) 100 UNIT/ML injection Inject into the skin 2 (two) times daily with a meal. 54 units in AM and 48 units in PM      . metFORMIN (GLUCOPHAGE) 1000 MG tablet Take 1,000 mg by mouth 2 (two) times daily with a meal.      . omeprazole (PRILOSEC) 20 MG capsule Take 20 mg by mouth daily.      . potassium chloride SA (K-DUR,KLOR-CON) 20 MEQ tablet Take 20 mEq by mouth daily.      . simvastatin (ZOCOR) 40 MG tablet Take 40 mg by mouth every evening.      .  triamterene-hydrochlorothiazide (DYAZIDE) 37.5-25 MG per capsule Take 1 capsule by mouth every morning.      . venlafaxine XR (EFFEXOR-XR) 150 MG 24 hr capsule Take 150 mg by mouth daily.       No current facility-administered medications for this visit.    Past Medical History  Diagnosis Date  . CAD (coronary artery disease)     Nonobstructive. Cardiac Cath 08/2008  . PAD (peripheral artery disease)     Moderate right renal artery stenosis 08/2008  . DM (diabetes mellitus)   . HTN (hypertension)   . HLD (hyperlipidemia)   . Obesity   . Asthma   . DJD (degenerative joint disease)   . Atypical chest pain     Normal troponins  . Acute diastolic heart failure   . Sinus bradycardia     first degree atrioventricular block  . Chronic kidney disease   . Other dyspnea and respiratory abnormality   . Peripheral arterial disease   . Carotid artery occlusion     RIGHT  bruit  . Renal artery stenosis Feb. 2010    Moderate  . CHF (congestive heart failure)   . GERD (gastroesophageal reflux disease)     Past Surgical  History  Procedure Laterality Date  . Cardiac catheterization  Feb. 2010    Nonobstructive  . No past surgeries      History   Social History  . Marital Status: Married    Spouse Name: N/A    Number of Children: N/A  . Years of Education: N/A   Occupational History  . Not on file.   Social History Main Topics  . Smoking status: Never Smoker   . Smokeless tobacco: Never Used  . Alcohol Use: No  . Drug Use: No  . Sexually Active: Not on file   Other Topics Concern  . Not on file   Social History Narrative  . No narrative on file   Social History Narrative  . No narrative on file    Problem Relation Age of Onset  . Cancer Mother     Stomach  . Deep vein thrombosis Mother   . Diabetes Mother   . Hypertension Mother   . Heart disease Mother     Bleeding problem  . Hypertension Father   . Diabetes Sister   . Hyperlipidemia Sister   .  Hypertension Sister   . Heart disease Sister     Bleeding problem  . Diabetes Brother   . Hyperlipidemia Brother   . Hypertension Brother     ROS: no nausea, vomiting; no fever, chills; no melena, hematochezia; no claudication  PHYSICAL EXAM: BP 108/69  Pulse 69  Ht 5\' 6"  (1.676 m)  Wt 222 lb 1.9 oz (100.753 kg)  BMI 35.87 kg/m2  SpO2 98% GENERAL: 72 year old female, obese; NAD  HEENT: NCAT, PERRLA, EOMI; sclera clear; no xanthelasma  NECK: Right carotid bruit; no JVD  LUNGS: Diminished breath sounds, no crackles or wheezes  CARDIAC: RRR (S1, S2); short, 2/6 systolic ejection murmur; no rubs or gallops  ABDOMEN: soft, protuberant  EXTREMETIES: trace peripheral edema  SKIN: warm/dry; no obvious rash/lesions  MUSCULOSKELETAL: no joint deformity  NEURO: no focal deficit; NL affect    EKG:    ASSESSMENT & PLAN:  Exertional dyspnea The results of the recent R/L heart catheterization were reviewed with the patient, which yielded no cardiac etiology for her significant DOE. Moreover, it also did not reveal evidence of PHTN, which had been been recently assessed as moderate by echocardiography. Therefore, I recommend formal pulmonary evaluation with Dr. Cherie Ouch in the next several weeks, after allowing recovery from her R CEA, scheduled for the end of this week.  HTN (hypertension) Much improved, following recent addition of amlodipine.  Occlusion and stenosis of carotid artery without mention of cerebral infarction Patient is scheduled to undergo R CEA at the end of this week, following finding of high-grade RICA stenosis, by duplex imaging here in our office.  MIXED HYPERLIPIDEMIA Patient is now on amlodipine. Therefore, will discontinue simvastatin and substituted with Lipitor 40 mg daily, for moderate intensity statin therapy, in light of her current carotid artery disease, and previously documented renal artery disease. Reassess lipid status in 12 weeks.  Chronic  diastolic heart failure Patient has diuresed 5 pounds since last OV. Recent followup labs stable. Continue current diuretic regimen.    Gene Ivorie Uplinger, PAC

## 2012-12-06 NOTE — Assessment & Plan Note (Signed)
Patient is scheduled to undergo R CEA at the end of this week, following finding of high-grade RICA stenosis, by duplex imaging here in our office.

## 2012-12-07 MED ORDER — VANCOMYCIN HCL 10 G IV SOLR
1500.0000 mg | INTRAVENOUS | Status: AC
  Administered 2012-12-08: 1500 mg via INTRAVENOUS
  Filled 2012-12-07: qty 1500

## 2012-12-08 ENCOUNTER — Telehealth: Payer: Self-pay | Admitting: Vascular Surgery

## 2012-12-08 ENCOUNTER — Encounter (HOSPITAL_COMMUNITY)
Admission: RE | Disposition: A | Discharge status: Home / Self Care | Payer: Self-pay | Source: Ambulatory Visit | Attending: Vascular Surgery

## 2012-12-08 ENCOUNTER — Encounter (HOSPITAL_COMMUNITY): Payer: Self-pay | Admitting: Anesthesiology

## 2012-12-08 ENCOUNTER — Inpatient Hospital Stay (HOSPITAL_COMMUNITY)
Admission: RE | Admit: 2012-12-08 | Discharge: 2012-12-09 | DRG: 039 | Disposition: A | Discharge status: Home / Self Care | Payer: No Typology Code available for payment source | Source: Ambulatory Visit | Attending: Vascular Surgery | Admitting: Vascular Surgery

## 2012-12-08 ENCOUNTER — Encounter (HOSPITAL_COMMUNITY): Payer: Self-pay | Admitting: Surgery

## 2012-12-08 ENCOUNTER — Inpatient Hospital Stay (HOSPITAL_COMMUNITY): Payer: No Typology Code available for payment source | Admitting: Anesthesiology

## 2012-12-08 DIAGNOSIS — Z7982 Long term (current) use of aspirin: Secondary | ICD-10-CM

## 2012-12-08 DIAGNOSIS — I7389 Other specified peripheral vascular diseases: Secondary | ICD-10-CM | POA: Diagnosis present

## 2012-12-08 DIAGNOSIS — I6529 Occlusion and stenosis of unspecified carotid artery: Secondary | ICD-10-CM

## 2012-12-08 DIAGNOSIS — I2510 Atherosclerotic heart disease of native coronary artery without angina pectoris: Secondary | ICD-10-CM | POA: Diagnosis present

## 2012-12-08 DIAGNOSIS — Z6833 Body mass index (BMI) 33.0-33.9, adult: Secondary | ICD-10-CM

## 2012-12-08 DIAGNOSIS — Z8249 Family history of ischemic heart disease and other diseases of the circulatory system: Secondary | ICD-10-CM

## 2012-12-08 DIAGNOSIS — Z79899 Other long term (current) drug therapy: Secondary | ICD-10-CM

## 2012-12-08 DIAGNOSIS — I10 Essential (primary) hypertension: Secondary | ICD-10-CM | POA: Diagnosis present

## 2012-12-08 DIAGNOSIS — E669 Obesity, unspecified: Secondary | ICD-10-CM | POA: Diagnosis present

## 2012-12-08 DIAGNOSIS — Z794 Long term (current) use of insulin: Secondary | ICD-10-CM

## 2012-12-08 DIAGNOSIS — Z01812 Encounter for preprocedural laboratory examination: Secondary | ICD-10-CM

## 2012-12-08 DIAGNOSIS — J45909 Unspecified asthma, uncomplicated: Secondary | ICD-10-CM | POA: Diagnosis present

## 2012-12-08 DIAGNOSIS — K219 Gastro-esophageal reflux disease without esophagitis: Secondary | ICD-10-CM | POA: Diagnosis present

## 2012-12-08 DIAGNOSIS — E119 Type 2 diabetes mellitus without complications: Secondary | ICD-10-CM | POA: Diagnosis present

## 2012-12-08 DIAGNOSIS — E785 Hyperlipidemia, unspecified: Secondary | ICD-10-CM | POA: Diagnosis present

## 2012-12-08 DIAGNOSIS — Z833 Family history of diabetes mellitus: Secondary | ICD-10-CM

## 2012-12-08 HISTORY — PX: ENDARTERECTOMY: SHX5162

## 2012-12-08 LAB — GLUCOSE, CAPILLARY
Glucose-Capillary: 99 mg/dL (ref 70–99)
Glucose-Capillary: 105 mg/dL — ABNORMAL HIGH (ref 70–99)

## 2012-12-08 SURGERY — ENDARTERECTOMY, CAROTID
Anesthesia: General | Site: Neck | Laterality: Right | Wound class: Clean

## 2012-12-08 MED ORDER — POTASSIUM CHLORIDE CRYS ER 20 MEQ PO TBCR: 20.0000 meq | EXTENDED_RELEASE_TABLET | Freq: Once | ORAL | Status: AC | PRN

## 2012-12-08 MED ORDER — AMLODIPINE BESYLATE 5 MG PO TABS
5.0000 mg | ORAL_TABLET | Freq: Every day | ORAL | Status: DC
  Administered 2012-12-09: 5 mg via ORAL
  Filled 2012-12-08: qty 1

## 2012-12-08 MED ORDER — POTASSIUM CHLORIDE CRYS ER 20 MEQ PO TBCR
20.0000 meq | EXTENDED_RELEASE_TABLET | Freq: Every day | ORAL | Status: DC
  Administered 2012-12-09: 20 meq via ORAL
  Filled 2012-12-08: qty 1

## 2012-12-08 MED ORDER — LIDOCAINE HCL 4 % MT SOLN
OROMUCOSAL | Status: DC | PRN
  Administered 2012-12-08: 4 mL via TOPICAL

## 2012-12-08 MED ORDER — ONDANSETRON HCL 4 MG/2ML IJ SOLN: 4.0000 mg | Freq: Once | INTRAMUSCULAR | Status: DC | PRN

## 2012-12-08 MED ORDER — VENLAFAXINE HCL ER 150 MG PO CP24
150.0000 mg | ORAL_CAPSULE | Freq: Every day | ORAL | Status: DC
  Administered 2012-12-09: 150 mg via ORAL
  Filled 2012-12-08: qty 1

## 2012-12-08 MED ORDER — PROPOFOL 10 MG/ML IV BOLUS
INTRAVENOUS | Status: DC | PRN
  Administered 2012-12-08: 120 mg via INTRAVENOUS

## 2012-12-08 MED ORDER — HEPARIN SODIUM (PORCINE) 1000 UNIT/ML IJ SOLN
INTRAMUSCULAR | Status: DC | PRN
  Administered 2012-12-08: 8000 [IU] via INTRAVENOUS

## 2012-12-08 MED ORDER — BENAZEPRIL HCL 40 MG PO TABS
40.0000 mg | ORAL_TABLET | Freq: Every day | ORAL | Status: DC
  Administered 2012-12-09: 40 mg via ORAL
  Filled 2012-12-08: qty 1

## 2012-12-08 MED ORDER — PROTAMINE SULFATE 10 MG/ML IV SOLN
INTRAVENOUS | Status: DC | PRN
  Administered 2012-12-08: 50 mg via INTRAVENOUS

## 2012-12-08 MED ORDER — METOPROLOL TARTRATE 1 MG/ML IV SOLN: 2.0000 mg | INTRAVENOUS | Status: DC | PRN

## 2012-12-08 MED ORDER — SODIUM CHLORIDE 0.9 % IV SOLN: 500.0000 mL | Freq: Once | INTRAVENOUS | Status: AC | PRN

## 2012-12-08 MED ORDER — OXYCODONE HCL 5 MG PO TABS: 5.0000 mg | ORAL_TABLET | Freq: Once | ORAL | Status: DC | PRN

## 2012-12-08 MED ORDER — LABETALOL HCL 5 MG/ML IV SOLN: 10.0000 mg | INTRAVENOUS | Status: DC | PRN

## 2012-12-08 MED ORDER — MEPERIDINE HCL 25 MG/ML IJ SOLN: 6.2500 mg | INTRAMUSCULAR | Status: DC | PRN

## 2012-12-08 MED ORDER — POTASSIUM CHLORIDE IN NACL 20-0.9 MEQ/L-% IV SOLN
INTRAVENOUS | Status: DC
  Administered 2012-12-08: 16:00:00 via INTRAVENOUS
  Filled 2012-12-08 (×3): qty 1000

## 2012-12-08 MED ORDER — NEOSTIGMINE METHYLSULFATE 1 MG/ML IJ SOLN
INTRAMUSCULAR | Status: DC | PRN
  Administered 2012-12-08: 5 mg via INTRAVENOUS

## 2012-12-08 MED ORDER — LIDOCAINE HCL (CARDIAC) 20 MG/ML IV SOLN
INTRAVENOUS | Status: DC | PRN
  Administered 2012-12-08: 50 mg via INTRAVENOUS

## 2012-12-08 MED ORDER — ONDANSETRON HCL 4 MG/2ML IJ SOLN: 4.0000 mg | Freq: Four times a day (QID) | INTRAMUSCULAR | Status: DC | PRN

## 2012-12-08 MED ORDER — TRIAMTERENE-HCTZ 37.5-25 MG PO TABS
1.0000 | ORAL_TABLET | Freq: Every day | ORAL | Status: DC
  Administered 2012-12-09: 1 via ORAL
  Filled 2012-12-08: qty 1

## 2012-12-08 MED ORDER — OXYCODONE-ACETAMINOPHEN 5-325 MG PO TABS: 1.0000 | ORAL_TABLET | ORAL | Status: DC | PRN

## 2012-12-08 MED ORDER — GUAIFENESIN-DM 100-10 MG/5ML PO SYRP: 15.0000 mL | ORAL_SOLUTION | ORAL | Status: DC | PRN

## 2012-12-08 MED ORDER — VANCOMYCIN HCL IN DEXTROSE 1-5 GM/200ML-% IV SOLN
1000.0000 mg | Freq: Two times a day (BID) | INTRAVENOUS | Status: AC
  Administered 2012-12-08 – 2012-12-09 (×2): 1000 mg via INTRAVENOUS
  Filled 2012-12-08 (×2): qty 200

## 2012-12-08 MED ORDER — DOCUSATE SODIUM 100 MG PO CAPS
100.0000 mg | ORAL_CAPSULE | Freq: Every day | ORAL | Status: DC
  Administered 2012-12-09: 100 mg via ORAL
  Filled 2012-12-08: qty 1

## 2012-12-08 MED ORDER — ROCURONIUM BROMIDE 100 MG/10ML IV SOLN
INTRAVENOUS | Status: DC | PRN
  Administered 2012-12-08: 10 mg via INTRAVENOUS
  Administered 2012-12-08: 50 mg via INTRAVENOUS
  Administered 2012-12-08: 10 mg via INTRAVENOUS

## 2012-12-08 MED ORDER — LIDOCAINE HCL (PF) 1 % IJ SOLN
INTRAMUSCULAR | Status: AC
  Filled 2012-12-08: qty 30

## 2012-12-08 MED ORDER — PHENOL 1.4 % MT LIQD: 1.0000 | OROMUCOSAL | Status: DC | PRN

## 2012-12-08 MED ORDER — HYDROMORPHONE HCL PF 1 MG/ML IJ SOLN
INTRAMUSCULAR | Status: AC
  Filled 2012-12-08: qty 1

## 2012-12-08 MED ORDER — SODIUM CHLORIDE 0.9 % IR SOLN
Status: DC | PRN
  Administered 2012-12-08: 08:00:00

## 2012-12-08 MED ORDER — OXYCODONE HCL 5 MG/5ML PO SOLN: 5.0000 mg | Freq: Once | ORAL | Status: DC | PRN

## 2012-12-08 MED ORDER — SUFENTANIL CITRATE 50 MCG/ML IV SOLN
INTRAVENOUS | Status: DC | PRN
  Administered 2012-12-08: 25 ug via INTRAVENOUS

## 2012-12-08 MED ORDER — INSULIN ASPART 100 UNIT/ML ~~LOC~~ SOLN
0.0000 [IU] | Freq: Three times a day (TID) | SUBCUTANEOUS | Status: DC
  Administered 2012-12-09: 3 [IU] via SUBCUTANEOUS

## 2012-12-08 MED ORDER — HYDRALAZINE HCL 20 MG/ML IJ SOLN: 10.0000 mg | INTRAMUSCULAR | Status: DC | PRN

## 2012-12-08 MED ORDER — FUROSEMIDE 40 MG PO TABS
40.0000 mg | ORAL_TABLET | Freq: Every day | ORAL | Status: DC
  Administered 2012-12-09: 40 mg via ORAL
  Filled 2012-12-08: qty 1

## 2012-12-08 MED ORDER — ACETAMINOPHEN 325 MG PO TABS
325.0000 mg | ORAL_TABLET | ORAL | Status: DC | PRN
Start: 2012-12-08 — End: 2012-12-09
  Administered 2012-12-09: 650 mg via ORAL
  Filled 2012-12-08: qty 2

## 2012-12-08 MED ORDER — SODIUM CHLORIDE 0.9 % IV SOLN
INTRAVENOUS | Status: DC
  Administered 2012-12-08 (×2): via INTRAVENOUS

## 2012-12-08 MED ORDER — 0.9 % SODIUM CHLORIDE (POUR BTL) OPTIME
TOPICAL | Status: DC | PRN
  Administered 2012-12-08: 2000 mL

## 2012-12-08 MED ORDER — MIDAZOLAM HCL 5 MG/5ML IJ SOLN
INTRAMUSCULAR | Status: DC | PRN
  Administered 2012-12-08: 1 mg via INTRAVENOUS

## 2012-12-08 MED ORDER — PHENYLEPHRINE HCL 10 MG/ML IJ SOLN
10.0000 mg | INTRAVENOUS | Status: DC | PRN
  Administered 2012-12-08: 40 ug/min via INTRAVENOUS

## 2012-12-08 MED ORDER — MORPHINE SULFATE 2 MG/ML IJ SOLN
2.0000 mg | INTRAMUSCULAR | Status: DC | PRN
  Administered 2012-12-08 (×3): 2 mg via INTRAVENOUS
  Filled 2012-12-08 (×2): qty 1

## 2012-12-08 MED ORDER — SODIUM CHLORIDE 0.9 % IV SOLN
INTRAVENOUS | Status: DC | PRN
  Administered 2012-12-08: 08:00:00 via INTRAVENOUS

## 2012-12-08 MED ORDER — OXYCODONE-ACETAMINOPHEN 5-325 MG PO TABS
ORAL_TABLET | ORAL | Status: AC
  Administered 2012-12-08: 2
  Filled 2012-12-08: qty 2

## 2012-12-08 MED ORDER — GLIPIZIDE ER 10 MG PO TB24
10.0000 mg | ORAL_TABLET | Freq: Every day | ORAL | Status: DC
  Administered 2012-12-09: 10 mg via ORAL
  Filled 2012-12-08: qty 1

## 2012-12-08 MED ORDER — MORPHINE SULFATE 2 MG/ML IJ SOLN
INTRAMUSCULAR | Status: AC
  Filled 2012-12-08: qty 1

## 2012-12-08 MED ORDER — GLYCOPYRROLATE 0.2 MG/ML IJ SOLN
INTRAMUSCULAR | Status: DC | PRN
  Administered 2012-12-08: .2 mg via INTRAVENOUS
  Administered 2012-12-08: .6 mg via INTRAVENOUS

## 2012-12-08 MED ORDER — ASPIRIN EC 81 MG PO TBEC
81.0000 mg | DELAYED_RELEASE_TABLET | Freq: Every day | ORAL | Status: DC
  Administered 2012-12-09: 81 mg via ORAL
  Filled 2012-12-08: qty 1

## 2012-12-08 MED ORDER — ACETAMINOPHEN 650 MG RE SUPP: 325.0000 mg | RECTAL | Status: DC | PRN

## 2012-12-08 MED ORDER — PANTOPRAZOLE SODIUM 40 MG PO TBEC
40.0000 mg | DELAYED_RELEASE_TABLET | Freq: Every day | ORAL | Status: DC
  Administered 2012-12-09: 40 mg via ORAL
  Filled 2012-12-08: qty 1

## 2012-12-08 MED ORDER — HYDROMORPHONE HCL PF 1 MG/ML IJ SOLN
0.2500 mg | INTRAMUSCULAR | Status: DC | PRN
  Administered 2012-12-08 (×4): 0.5 mg via INTRAVENOUS

## 2012-12-08 MED ORDER — METFORMIN HCL 500 MG PO TABS: 1000.0000 mg | ORAL_TABLET | Freq: Two times a day (BID) | ORAL | Status: DC

## 2012-12-08 MED ORDER — ONDANSETRON HCL 4 MG/2ML IJ SOLN
INTRAMUSCULAR | Status: DC | PRN
  Administered 2012-12-08: 4 mg via INTRAVENOUS

## 2012-12-08 MED ORDER — TRIAMTERENE-HCTZ 37.5-25 MG PO CAPS
1.0000 | ORAL_CAPSULE | ORAL | Status: DC
  Filled 2012-12-08: qty 1

## 2012-12-08 SURGICAL SUPPLY — 52 items
BENZOIN TINCTURE PRP APPL 2/3 (GAUZE/BANDAGES/DRESSINGS) ×2 IMPLANT
CANISTER SUCTION 2500CC (MISCELLANEOUS) ×2 IMPLANT
CATH ROBINSON RED A/P 18FR (CATHETERS) ×2 IMPLANT
CLIP LIGATING EXTRA MED SLVR (CLIP) ×2 IMPLANT
CLIP LIGATING EXTRA SM BLUE (MISCELLANEOUS) ×2 IMPLANT
CLOTH BEACON ORANGE TIMEOUT ST (SAFETY) ×2 IMPLANT
COVER SURGICAL LIGHT HANDLE (MISCELLANEOUS) ×2 IMPLANT
CRADLE DONUT ADULT HEAD (MISCELLANEOUS) ×2 IMPLANT
DECANTER SPIKE VIAL GLASS SM (MISCELLANEOUS) IMPLANT
DRAIN HEMOVAC 1/8 X 5 (WOUND CARE) IMPLANT
DRAPE WARM FLUID 44X44 (DRAPE) ×2 IMPLANT
DRSG COVADERM 4X8 (GAUZE/BANDAGES/DRESSINGS) ×2 IMPLANT
ELECT REM PT RETURN 9FT ADLT (ELECTROSURGICAL) ×2
ELECTRODE REM PT RTRN 9FT ADLT (ELECTROSURGICAL) ×1 IMPLANT
EVACUATOR SILICONE 100CC (DRAIN) IMPLANT
GEL ULTRASOUND 20GR AQUASONIC (MISCELLANEOUS) IMPLANT
GLOVE BIOGEL PI IND STRL 6.5 (GLOVE) ×2 IMPLANT
GLOVE BIOGEL PI IND STRL 7.0 (GLOVE) ×1 IMPLANT
GLOVE BIOGEL PI IND STRL 7.5 (GLOVE) ×1 IMPLANT
GLOVE BIOGEL PI INDICATOR 6.5 (GLOVE) ×2
GLOVE BIOGEL PI INDICATOR 7.0 (GLOVE) ×1
GLOVE BIOGEL PI INDICATOR 7.5 (GLOVE) ×1
GLOVE ECLIPSE 6.5 STRL STRAW (GLOVE) ×2 IMPLANT
GLOVE ECLIPSE 7.0 STRL STRAW (GLOVE) ×2 IMPLANT
GLOVE ECLIPSE 7.5 STRL STRAW (GLOVE) ×2 IMPLANT
GLOVE SS BIOGEL STRL SZ 6.5 (GLOVE) ×1 IMPLANT
GLOVE SS BIOGEL STRL SZ 7.5 (GLOVE) ×1 IMPLANT
GLOVE SUPERSENSE BIOGEL SZ 6.5 (GLOVE) ×1
GLOVE SUPERSENSE BIOGEL SZ 7.5 (GLOVE) ×1
GOWN STRL NON-REIN LRG LVL3 (GOWN DISPOSABLE) ×8 IMPLANT
GOWN STRL REIN XL XLG (GOWN DISPOSABLE) ×2 IMPLANT
KIT BASIN OR (CUSTOM PROCEDURE TRAY) ×2 IMPLANT
KIT ROOM TURNOVER OR (KITS) ×2 IMPLANT
NEEDLE 22X1 1/2 (OR ONLY) (NEEDLE) IMPLANT
NS IRRIG 1000ML POUR BTL (IV SOLUTION) ×4 IMPLANT
PACK CAROTID (CUSTOM PROCEDURE TRAY) ×2 IMPLANT
PAD ARMBOARD 7.5X6 YLW CONV (MISCELLANEOUS) ×4 IMPLANT
PATCH HEMASHIELD 8X75 (Vascular Products) ×2 IMPLANT
SHUNT CAROTID BYPASS 10 (VASCULAR PRODUCTS) ×2 IMPLANT
SHUNT CAROTID BYPASS 12FRX15.5 (VASCULAR PRODUCTS) IMPLANT
STRIP CLOSURE SKIN 1/2X4 (GAUZE/BANDAGES/DRESSINGS) ×2 IMPLANT
SUT ETHILON 3 0 PS 1 (SUTURE) ×2 IMPLANT
SUT PROLENE 6 0 CC (SUTURE) ×4 IMPLANT
SUT SILK 3 0 (SUTURE)
SUT SILK 3-0 18XBRD TIE 12 (SUTURE) IMPLANT
SUT VIC AB 3-0 SH 27 (SUTURE) ×2
SUT VIC AB 3-0 SH 27X BRD (SUTURE) ×2 IMPLANT
SUT VICRYL 4-0 PS2 18IN ABS (SUTURE) ×2 IMPLANT
SYR CONTROL 10ML LL (SYRINGE) IMPLANT
TOWEL OR 17X24 6PK STRL BLUE (TOWEL DISPOSABLE) ×2 IMPLANT
TOWEL OR 17X26 10 PK STRL BLUE (TOWEL DISPOSABLE) ×2 IMPLANT
WATER STERILE IRR 1000ML POUR (IV SOLUTION) ×2 IMPLANT

## 2012-12-08 NOTE — Telephone Encounter (Addendum)
Message copied by Fredrich Birks on Fri Dec 08, 2012 12:39 PM ------      Message from: Phillips Odor      Created: Fri Dec 08, 2012 11:39 AM                   ----- Message -----         From: Marlowe Shores, PA-C         Sent: 12/08/2012   9:37 AM           To: Melene Plan, RN, [facility]            2 week F/U CEA - Early ------  Spoke with husband, I think. He was hard of hearing, so I mailed a letter also, dpm

## 2012-12-08 NOTE — H&P (View-Only) (Signed)
[facility] [place]   Patient name: Miranda Sexton MRN: [id number] DOB: 11/01/1940 Sex: female   Referred by: Katz  Reason for referral:  Chief Complaint  Patient presents with  . New Evaluation    Carotid/ Right Ref. by Dr. Jeff Katz   C/O  Pain in Right leg with walking and lying flat, duration 6+ months.  Weakness in right arm and leg, duration , 6+ mo.  Swelling right leg, [facility] 2 days, April  2014.    HISTORY OF PRESENT ILLNESS: Patient was found to have a symptomatic carotid bruit and further workup revealed severe right internal carotid artery stenosis. She specifically denies any prior history of amaurosis fugax, transient ischemic attack or stroke. She does have some symptoms of soreness in her right thigh and calf with walking and sewn to be claudication is not supported by physical exam. She is seen today for consideration of carotid endarterectomy  Past Medical History  Diagnosis Date  . CAD (coronary artery disease)     Nonobstructive. Cardiac Cath 08/2008  . PAD (peripheral artery disease)     Moderate right renal artery stenosis 08/2008  . DM (diabetes mellitus)   . HTN (hypertension)   . HLD (hyperlipidemia)   . Obesity   . Asthma   . DJD (degenerative joint disease)   . Atypical chest pain     Normal troponins  . Acute diastolic heart failure   . Sinus bradycardia     first degree atrioventricular block  . Chronic kidney disease   . Other dyspnea and respiratory abnormality   . Peripheral arterial disease   . Carotid artery occlusion     RIGHT  bruit  . Renal artery stenosis Feb. 2010    Moderate  . CHF (congestive heart failure)     Past Surgical History  Procedure Laterality Date  . Cardiac catheterization  Feb. 2010    Nonobstructive    History   Social History  . Marital Status: Married    Spouse Name: N/A    Number of Children: N/A  . Years of Education: N/A   Occupational History  . Not on file.    Social History Main Topics  . Smoking status: Never Smoker   . Smokeless tobacco: Never Used  . Alcohol Use: No  . Drug Use: No  . Sexually Active: Not on file   Other Topics Concern  . Not on file   Social History Narrative  . No narrative on file    Family History  Problem Relation Age of Onset  . Cancer Mother     Stomach  . Deep vein thrombosis Mother   . Diabetes Mother   . Hypertension Mother   . Heart disease Mother     Bleeding problem  . Hypertension Father   . Diabetes Sister   . Hyperlipidemia Sister   . Hypertension Sister   . Heart disease Sister     Bleeding problem  . Diabetes Brother   . Hyperlipidemia Brother   . Hypertension Brother     Allergies as of 12/05/2012 - Review Complete 12/05/2012  Allergen Reaction Noted  . Codeine  09/10/2008  . Penicillins  09/10/2008    Current Outpatient Prescriptions on File Prior to Visit  Medication Sig Dispense Refill  . amLODipine (NORVASC) 5 MG tablet Take 1 tablet (5 mg total) by mouth daily.  30 tablet  6  . aspirin EC 81 MG tablet Take 1 tablet (81 mg total) by   mouth daily.      . benazepril (LOTENSIN) 40 MG tablet Take 40 mg by mouth daily.      . furosemide (LASIX) 40 MG tablet Take 1 tablet (40 mg total) by mouth daily.  30 tablet  6  . glipiZIDE (GLUCOTROL XL) 10 MG 24 hr tablet Take 10 mg by mouth daily.      . insulin aspart protamine- aspart (NOVOLOG 70/30) (70-30) 100 UNIT/ML injection Inject 24-38 Units into the skin 2 (two) times daily with a meal. 38 units in AM and 24 units in PM      . metFORMIN (GLUCOPHAGE) 1000 MG tablet Take 1,000 mg by mouth 2 (two) times daily with a meal.      . omeprazole (PRILOSEC) 20 MG capsule Take 20 mg by mouth daily.      . potassium chloride SA (K-DUR,KLOR-CON) 20 MEQ tablet Take 20 mEq by mouth daily.      . simvastatin (ZOCOR) 40 MG tablet Take 40 mg by mouth every evening.      . triamterene-hydrochlorothiazide (DYAZIDE) 37.5-25 MG per capsule Take 1  capsule by mouth every morning.      . venlafaxine XR (EFFEXOR-XR) 150 MG 24 hr capsule Take 150 mg by mouth daily.       No current facility-administered medications on file prior to visit.     REVIEW OF SYSTEMS:  Positives indicated with an "X"  CARDIOVASCULAR:  [ ] chest pain   [ ] chest pressure   [ ] palpitations   [x] orthopnea   [x ] dyspnea on exertion   x[ ] claudication   [ ] rest pain   [ ] DVT   [ ] phlebitis PULMONARY:   [ ] productive cough   [x ] asthma   [x ] wheezing NEUROLOGIC:   [x weakness  [ ] paresthesias  [ ] aphasia  [ ] amaurosis  [x ] dizziness HEMATOLOGIC:   [ ] bleeding problems   [ ] clotting disorders MUSCULOSKELETAL:  [ ] joint pain   [ ] joint swelling GASTROINTESTINAL: [ ]  blood in stool  [ ]  hematemesis GENITOURINARY:  [ ]  dysuria  [ ]  hematuria PSYCHIATRIC:  [ ] history of major depression INTEGUMENTARY:  [ ] rashes  [ ] ulcers CONSTITUTIONAL:  [ ] fever   [ ] chills  PHYSICAL EXAMINATION:  General: The patient is a well-nourished female, in no acute distress. Vital signs are BP 117/71  Pulse 57  Resp 16  Ht 5' 7.5" (1.715 m)  Wt 220 lb (99.791 kg)  BMI 33.93 kg/m2  SpO2 97% Pulmonary: There is a good air exchange bilaterally without wheezing or rales. Abdomen: Soft and non-tender with normal pitch bowel sounds. Musculoskeletal: There are no major deformities.  There is no significant extremity pain. Neurologic: No focal weakness or paresthesias are detected, Skin: There are no ulcer or rashes noted. Psychiatric: The patient has normal affect. Cardiovascular: There is a regular rate and rhythm without significant murmur appreciated. Carotid artery with soft right carotid bruit membrane left Patient has had normal radial 2+, 2+ femoral and 2+ popliteal pulses bilaterally. Does have some swelling I do not appreciate pedal pulses  [facility] Vascular Lab Studies:  Ordered and Independently Reviewed he did have reimaging of her right internal  carotid and carotid bifurcation determine if she was a candidate for endarterectomy based on duplex. She does have severe stenosis as seen an outlying study. Internal carotid becomes normal above   the bifurcation.  Impression and Plan:  Severe asymptomatic right internal carotid artery stenosis. The patient is left-handed. I did explain this is her dominant hemisphere. Risk for left sided paralysis. I have recommended right carotid endarterectomy for reduction of stroke risk. I explained the procedure to include about 1 or infection risk of stroke with surgery. I did explain the expected one-day hospitalization. She wished to proceed with surgery on 12/08/2012. He did have a recent cardiac catheterization without any evidence of stenosis    Amed Datta [facility] [place] Office: [phone]          

## 2012-12-08 NOTE — Progress Notes (Signed)
Utilization review completed.  

## 2012-12-08 NOTE — Anesthesia Postprocedure Evaluation (Signed)
Anesthesia Post Note  Patient: Miranda Sexton  Procedure(s) Performed: Procedure(s) (LRB): ENDARTERECTOMY CAROTID (Right)  Anesthesia type: general  Patient location: PACU  Post pain: Pain level controlled  Post assessment: Patient's Cardiovascular Status Stable  Last Vitals:  Filed Vitals:   12/08/12 1115  BP: 106/52  Pulse: 57  Temp:   Resp: 15    Post vital signs: Reviewed and stable  Level of consciousness: sedated  Complications: No apparent anesthesia complications

## 2012-12-08 NOTE — Transfer of Care (Signed)
Immediate Anesthesia Transfer of Care Note  Patient: Miranda Sexton  Procedure(s) Performed: Procedure(s): ENDARTERECTOMY CAROTID (Right)  Patient Location: PACU  Anesthesia Type:General  Level of Consciousness: awake, alert , oriented, sedated, patient cooperative and responds to stimulation  Airway & Oxygen Therapy: Patient Spontanous Breathing and Patient connected to face mask oxygen  Post-op Assessment: Report given to PACU RN, Post -op Vital signs reviewed and stable, Patient moving all extremities, Patient moving all extremities X 4 and Patient able to stick tongue midline  Post vital signs: Reviewed and stable  Complications: No apparent anesthesia complications

## 2012-12-08 NOTE — Discharge Summary (Signed)
[facility] Discharge Summary   Patient ID:  Miranda Sexton MRN: [id number] DOB/AGE: 1941/03/09 72 y.o.  Admit date: 12/08/2012 Discharge date: 12/09/2012 Date of Surgery: 12/08/2012 Surgeon: Surgeon(s): Larina Earthly, MD  Admission Diagnosis: RIGHT ICA STENOSIS  - asymptomatic  Discharge Diagnoses:  RIGHT ICA STENOSIS  Secondary Diagnoses: Past Medical History  Diagnosis Date  . CAD (coronary artery disease)     Nonobstructive. Cardiac Cath 08/2008  . PAD (peripheral artery disease)     Moderate right renal artery stenosis 08/2008  . DM (diabetes mellitus)   . HTN (hypertension)   . HLD (hyperlipidemia)   . Obesity   . Asthma   . DJD (degenerative joint disease)   . Atypical chest pain     Normal troponins  . Acute diastolic heart failure   . Sinus bradycardia     first degree atrioventricular block  . Chronic kidney disease   . Other dyspnea and respiratory abnormality   . Peripheral arterial disease   . Carotid artery occlusion     RIGHT  bruit  . Renal artery stenosis Feb. 2010    Moderate  . CHF (congestive heart failure)   . GERD (gastroesophageal reflux disease)     Procedure(s): Right ENDARTERECTOMY CAROTID with dacron patch angioplasty  Discharged Condition: good  HPI:  Miranda Sexton is a 72 y.o. female who was found to have a symptomatic carotid bruit and further workup revealed severe right internal carotid artery stenosis. She specifically denies any prior history of amaurosis fugax, transient ischemic attack or stroke. She does have some symptoms of soreness in her right thigh and calf with walking and sewn to be claudication is not supported by physical exam. She is seen today for consideration of carotid endarterectomy Severe asymptomatic right internal carotid artery stenosis. The patient is left-handed. I did explain this is her dominant hemisphere. Risk for left sided paralysis. I have recommended right carotid endarterectomy for  reduction of stroke risk. I explained the procedure to include about 1 or infection risk of stroke with surgery. I did explain the expected one-day hospitalization. She wished to proceed with surgery on 12/08/2012. He did have a recent cardiac catheterization without any evidence of stenosis    Hospital Course:  Miranda Sexton is a 72 y.o. female is S/P Right Procedure(s): ENDARTERECTOMY CAROTID with dacron patch angioplasty Extubated: POD # 0 Physical exam: Pt is A&O x 3  Gait is normal  Speech is fluent  right Neck Wound is healing well  Patient with Negative tongue deviation and Negative facial droop  Pt has good and equal strength in all extremities  Post-op wounds clean, dry, intact or healing well Pt. Ambulating, voiding and taking PO diet without difficulty. Pt pain controlled with PO pain meds. Labs as below Complications:none  Consults:     Significant Diagnostic Studies: CBC Lab Results  Component Value Date   WBC 6.6 12/05/2012   HGB 13.3 12/05/2012   HCT 39.8 12/05/2012   MCV 84.7 12/05/2012   PLT 329 12/05/2012    BMET    Component Value Date/Time   NA 132* 12/05/2012 1433   K 4.8 12/05/2012 1433   CL 95* 12/05/2012 1433   CO2 25 12/05/2012 1433   GLUCOSE 223* 12/05/2012 1433   BUN 29* 12/05/2012 1433   CREATININE 1.44* 12/05/2012 1433   CALCIUM 9.6 12/05/2012 1433   GFRNONAA 36* 12/05/2012 1433   GFRAA 41* 12/05/2012 1433   COAG Lab Results  Component Value  Date   INR 0.97 12/05/2012   INR 1.0 08/27/2008     Disposition:  Discharge to :Home Discharge Orders   Future Appointments Provider Department Dept Phone   03/14/2013 1:20 PM Jonelle Sidle, MD [facility] (near [place]) [phone]   Future Orders Complete By Expires     CAROTID Sugery: Call MD for difficulty swallowing or speaking; weakness in arms or legs that is a new symtom; severe headache.  If you have increased swelling in the neck and/or  are having difficulty breathing, CALL 911  As directed      Call MD for:  redness, tenderness, or signs of infection (pain, swelling, bleeding, redness, odor or green/yellow discharge around incision site)  As directed     Call MD for:  severe or increased pain, loss or decreased feeling  in affected limb(s)  As directed     Call MD for:  temperature >100.5  As directed     Driving Restrictions  As directed     Comments:      No driving for 2 weeks    Increase activity slowly  As directed     Comments:      Walk with assistance use walker or cane as needed    Lifting restrictions  As directed     Comments:      No lifting for 4 weeks    May shower   As directed     Scheduling Instructions:      Sunday    No dressing needed  As directed     Resume previous diet  As directed     may wash over wound with mild soap and water  As directed         Medication List    TAKE these medications       amLODipine 5 MG tablet  Commonly known as:  NORVASC  Take 1 tablet (5 mg total) by mouth daily.     aspirin EC 81 MG tablet  Take 1 tablet (81 mg total) by mouth daily.     atorvastatin 40 MG tablet  Commonly known as:  LIPITOR  Take 1 tablet (40 mg total) by mouth daily.     benazepril 40 MG tablet  Commonly known as:  LOTENSIN  Take 40 mg by mouth daily.     furosemide 40 MG tablet  Commonly known as:  LASIX  Take 1 tablet (40 mg total) by mouth daily.     glipiZIDE 10 MG 24 hr tablet  Commonly known as:  GLUCOTROL XL  Take 10 mg by mouth daily.     insulin aspart protamine- aspart (70-30) 100 UNIT/ML injection  Commonly known as:  NOVOLOG 70/30  Inject 48-58 Units into the skin 2 (two) times daily with a meal. 58 units in AM and 48 units in PM     metFORMIN 1000 MG tablet  Commonly known as:  GLUCOPHAGE  Take 1,000 mg by mouth 2 (two) times daily with a meal.     omeprazole 20 MG capsule  Commonly known as:  PRILOSEC  Take 20 mg by mouth daily.     oxyCODONE-acetaminophen 5-325 MG per tablet  Commonly known as:  ROXICET   Take 1 tablet by mouth every 4 (four) hours as needed for pain.     potassium chloride SA 20 MEQ tablet  Commonly known as:  K-DUR,KLOR-CON  Take 20 mEq by mouth daily.     triamterene-hydrochlorothiazide 37.5-25 MG per capsule  Commonly known as:  DYAZIDE  Take 1 capsule by mouth every morning.     venlafaxine XR 150 MG 24 hr capsule  Commonly known as:  EFFEXOR-XR  Take 150 mg by mouth daily.       Verbal and written Discharge instructions given to the patient. Wound care per Discharge AVS  Follow-up 2 weeks with Dr. Arbie Cookey  Signed: Marlowe Shores 12/08/2012, 9:34 AM   --- For VQI Registry use --- Instructions: Press F2 to tab through selections.  Delete question if not applicable.   Modified Rankin score at D/C (0-6): Rankin Score=0  IV medication needed for:  1. Hypertension: No 2. Hypotension: No  Post-op Complications: No  1. Post-op CVA or TIA: No  If yes: Event classification (right eye, left eye, right cortical, left cortical, verterobasilar, other):   If yes: Timing of event (intra-op, <6 hrs post-op, >=6 hrs post-op, unknown):   2. CN injury: No  If yes: CN  injuried   3. Myocardial infarction: No  If yes: Dx by (EKG or clinical, Troponin):   4.  CHF: No  5.  Dysrhythmia (new): Yes PVC  6. Wound infection: No  7. Reperfusion symptoms: No  8. Return to OR: No  If yes: return to OR for (bleeding, neurologic, other CEA incision, other):   Discharge medications: Statin use:  Yes ASA use:  Yes Beta blocker use:  No  for medical reason 0 ACE-Inhibitor use:  Yes P2Y12 Antagonist use: [x ] None, [ ]  Plavix, [ ]  Plasugrel, [ ]  Ticlopinine, [ ]  Ticagrelor, [ ]  Other, [ ]  No for medical reason, [ ]  Non-compliant, [ ]  Not-indicated Anti-coagulant use:  [x ] None, [ ]  Warfarin, [ ]  Rivaroxaban, [ ]  Dabigatran, [ ]  Other, [ ]  No for medical reason, [ ]  Non-compliant, [ ]  Not-indicated

## 2012-12-08 NOTE — Op Note (Signed)
[facility] [place]  Patient name: Miranda Sexton MRN: [id number] DOB: July 02, 1941 Sex: female  12/08/2012 Pre-operative Diagnosis: Asymptomatic right carotid stenosis Post-operative diagnosis:  Same Surgeon:  Larina Earthly, M.D. Assistants:  Roczniac Procedure:    right carotid Endarterectomy with Dacron patch angioplasty Anesthesia:  General Blood Loss:  See anesthesia record Specimens:  Carotid Plaque to pathology  Indications for surgery:  Asymptomatic  Procedure in detail:  The patient was taken to the operating and placed in the supine position. The neck was prepped and draped in the usual sterile fashion. An incision was made anterior to the sternocleidomastoid muscle and continued with electrocautery through the platysma muscle. The muscle was retracted posteriorly and the carotid sheath was opened. The facial vein was ligated with 2-0 silk ties and divided. The common carotid artery was encircled with an umbilical tape and Rummel tourniquet. Dissection was continued onto the carotid bifurcation. The superior thyroid artery was controlled with a 2-0 silk Potts tie. The external carotid organ was encircled with a vessel loop and the internal carotid was encircled with umbilical tape and Rummel tourniquet. The hypoglossal and vagus nerves were identified and preserved.  The patient was given systemic heparinization. After adequate circulation time, the internal,external and common carotid arteries were occluded. The common carotid was opened with an 11 blade and the arteriotomy was continued with Potts scissors onto the internal carotid artery. A 10 shunt was passed up the internal carotid artery, allowed to back bleed, and then passed down the common carotid artery. The shunt was secured with Rummel tourniquet. The endarterectomy was begun on the common carotid artery  plaque was divided proximally with Potts scissors. The endarterectomy was continued onto the carotid  bifurcation. The external carotid was endarterectomized by eversion technique and the internal carotid artery was endarterectomized in an open fashion. Remaining debris was removed from the endarterectomy plane. A Dacron patch was brought to the field and sewn as a patch angioplasty. Prior to completing the anastomosis, the shunt was removed and the usual flushing maneuvers were undertaken. The anastomosis was then completed and flow was restored first to the external and then the internal carotid artery. Excellent flow characteristics were noted with hand-held Doppler in the internal and external carotid arteries.  The patient was given protamine to reverse the heparin. Hemostasis was obtained with electrocautery. The wounds were irrigated with saline. The wound was closed by first reapproximating the sternocleidomastoid muscle over the carotid artery with interrupted 3-0 Vicryl sutures. Next, the platysma was closed with a running 3-0 Vicryl suture. The skin was closed with a 4-0 subcuticular Vicryl suture. Benzoin and Steri-Strips were applied to the incision. A sterile dressing was placed over the incision. All sponge and needle counts were correct. The patient was awakened in the operating room, neurologically intact. They were transferred to the PACU in stable condition.  Carotid stenosis at surgery:>80%  Disposition:  To PACU in stable condition,neurologically intact  Relevant Operative Details:  Mod high bifurcation  Larina Earthly, M.D. [facility] [place] Office: [phone] Pager:  [phone]

## 2012-12-08 NOTE — Progress Notes (Signed)
PHARMACIST - PHYSICIAN COMMUNICATION DR:  Early CONCERNING:  METFORMIN SAFE ADMINISTRATION POLICY  RECOMMENDATION: Metformin has been placed on DISCONTINUE (rejected order) STATUS and should be reordered only after any of the conditions below are ruled out.  Current safety recommendations include avoiding metformin for a minimum of 48 hours after the patient's exposure to intravenous contrast media.  DESCRIPTION:  The Pharmacy Committee has adopted a policy that restricts the use of metformin in hospitalized patients until all the contraindications to administration have been ruled out. Specific contraindications are: []  Serum creatinine ? 1.5 for males [x]  Serum creatinine ? 1.4 for females []  Shock, acute MI, sepsis, hypoxemia, dehydration []  Planned administration of intravenous iodinated contrast media []  Heart Failure patients with low EF []  Acute or chronic metabolic acidosis (including DKA)

## 2012-12-08 NOTE — Interval H&P Note (Signed)
History and Physical Interval Note:  12/08/2012 7:16 AM  Miranda Sexton  has presented today for surgery, with the diagnosis of RIGHT ICA STENOSIS  The various methods of treatment have been discussed with the patient and family. After consideration of risks, benefits and other options for treatment, the patient has consented to  Procedure(s): ENDARTERECTOMY CAROTID (Right) as a surgical intervention .  The patient's history has been reviewed, patient examined, no change in status, stable for surgery.  I have reviewed the patient's chart and labs.  Questions were answered to the patient's satisfaction.     Asahel Risden

## 2012-12-08 NOTE — Preoperative (Signed)
Beta Blockers   Reason not to administer Beta Blockers:Not Applicable 

## 2012-12-08 NOTE — Anesthesia Preprocedure Evaluation (Signed)
Anesthesia Evaluation  Patient identified by MRN, date of birth, ID band Patient awake    Reviewed: Allergy & Precautions, H&P , NPO status , Patient's Chart, lab work & pertinent test results  History of Anesthesia Complications Negative for: history of anesthetic complications  Airway Mallampati: II  Neck ROM: Full    Dental  (+) Teeth Intact   Pulmonary shortness of breath and with exertion, asthma ,    Pulmonary exam normal       Cardiovascular hypertension, Pt. on medications + CAD, + Peripheral Vascular Disease and +CHF + dysrhythmias Rate:Normal     Neuro/Psych negative neurological ROS  negative psych ROS   GI/Hepatic GERD-  ,  Endo/Other  diabetes, Well Controlled, Type 1, Insulin Dependent and Oral Hypoglycemic Agents  Renal/GU Renal InsufficiencyRenal disease     Musculoskeletal negative musculoskeletal ROS (+)   Abdominal Normal abdominal exam  (+)   Peds  Hematology negative hematology ROS (+)   Anesthesia Other Findings   Reproductive/Obstetrics negative OB ROS                           Anesthesia Physical Anesthesia Plan  ASA: III  Anesthesia Plan: General   Post-op Pain Management:    Induction: Intravenous  Airway Management Planned: Oral ETT  Additional Equipment: Arterial line  Intra-op Plan:   Post-operative Plan: Extubation in OR  Informed Consent: I have reviewed the patients History and Physical, chart, labs and discussed the procedure including the risks, benefits and alternatives for the proposed anesthesia with the patient or authorized representative who has indicated his/her understanding and acceptance.     Plan Discussed with: CRNA, Surgeon and Anesthesiologist  Anesthesia Plan Comments:         Anesthesia Quick Evaluation

## 2012-12-09 LAB — BASIC METABOLIC PANEL
BUN: 13 mg/dL (ref 6–23)
CO2: 26 mEq/L (ref 19–32)
Calcium: 8.8 mg/dL (ref 8.4–10.5)
Chloride: 98 mEq/L (ref 96–112)
Creatinine, Ser: 0.91 mg/dL (ref 0.50–1.10)
GFR calc Af Amer: 72 mL/min — ABNORMAL LOW (ref >90)
Glucose, Bld: 189 mg/dL — ABNORMAL HIGH (ref 70–99)
Potassium: 4.4 mEq/L (ref 3.5–5.1)

## 2012-12-09 LAB — GLUCOSE, CAPILLARY: Glucose-Capillary: 176 mg/dL — ABNORMAL HIGH (ref 70–99)

## 2012-12-09 LAB — CBC
HCT: 32.5 % — ABNORMAL LOW (ref 36.0–46.0)
Hemoglobin: 10.6 g/dL — ABNORMAL LOW (ref 12.0–15.0)
MCH: 27.8 pg (ref 26.0–34.0)
MCHC: 32.6 g/dL (ref 30.0–36.0)
MCV: 85.3 fL (ref 78.0–100.0)
Platelets: 250 K/uL (ref 150–400)
RBC: 3.81 MIL/uL — ABNORMAL LOW (ref 3.87–5.11)
RDW: 14.9 % (ref 11.5–15.5)
WBC: 8.2 K/uL (ref 4.0–10.5)

## 2012-12-09 LAB — HEMOGLOBIN A1C: Hgb A1c MFr Bld: 8.2 % — ABNORMAL HIGH (ref <5.7)

## 2012-12-09 MED ORDER — OXYCODONE-ACETAMINOPHEN 5-325 MG PO TABS: 1.0000 | ORAL_TABLET | ORAL | Status: DC | PRN

## 2012-12-09 NOTE — Progress Notes (Addendum)
VASCULAR AND VEIN SURGERY POST - OP CEA PROGRESS NOTE  Date of Surgery: 12/08/2012  Surgeon(s): Larina Earthly, MD 1 Day Post-Op right carotid endarterectomy  .  HPI: Miranda Sexton is a 72 y.o. female who is 1 Day Post-Op right carotid . Patient is doing well. Pre-operative symptoms are Improved Patient denies headache; Patient denies difficulty swallowing; denies weakness in upper or lower extremities; Pt. denies other symptoms of stroke or TIA.  IMAGING: None   Significant Diagnostic Studies: CBC Lab Results  Component Value Date   WBC 8.2 12/09/2012   HGB 10.6* 12/09/2012   HCT 32.5* 12/09/2012   MCV 85.3 12/09/2012   PLT 250 12/09/2012    BMET    Component Value Date/Time   NA 131* 12/09/2012 0500   K 4.4 12/09/2012 0500   CL 98 12/09/2012 0500   CO2 26 12/09/2012 0500   GLUCOSE 189* 12/09/2012 0500   BUN 13 12/09/2012 0500   CREATININE 0.91 12/09/2012 0500   CALCIUM 8.8 12/09/2012 0500   GFRNONAA 62* 12/09/2012 0500   GFRAA 72* 12/09/2012 0500    COAG Lab Results  Component Value Date   INR 0.97 12/05/2012   INR 1.0 08/27/2008   No results found for this basename: PTT      Intake/Output Summary (Last 24 hours) at 12/09/12 0742 Last data filed at 12/09/12 0402  Gross per 24 hour  Intake   1980 ml  Output   1550 ml  Net    430 ml    Physical Exam:  BP Readings from Last 3 Encounters:  12/09/12 132/52  12/09/12 132/52  12/06/12 108/69   Temp Readings from Last 3 Encounters:  12/09/12 98.2 F (36.8 C) Oral  12/09/12 98.2 F (36.8 C) Oral  12/05/12 97.7 F (36.5 C)    SpO2 Readings from Last 3 Encounters:  12/09/12 91%  12/09/12 91%  12/06/12 98%   Pulse Readings from Last 3 Encounters:  12/09/12 52  12/09/12 52  12/06/12 69    Pt is A&O x 3 Gait is normal Speech is fluent right Neck Wound is healing well Patient with Negative tongue deviation and Negative facial droop Pt has good and equal strength in all extremities  Assessment/Plan:: Miranda Sexton is a  72 y.o. female is S/P Right carotid endarterectomy Pt is voiding, ambulating and taking po well    Discharge to: Home Follow-up in 2 weeks   [facility]  12/09/2012 7:42 AM  Neuro intact No hematoma Continue aspirin d/c home  Fabienne Bruns, MD [facility] [place] Office: [phone] Pager: [phone]

## 2012-12-09 NOTE — Progress Notes (Signed)
Discharge instructions given to pt and her daughter -- verbalized understanding of follow-up appts, activity levels, diet, wound care, s/s of complications (including stroke), home meds and when to call MD/EMS. Renette Butters, Viona Gilmore

## 2012-12-09 NOTE — Progress Notes (Signed)
Pt. Ambulated this am short distance, slight short of breath O2 sat. 88-89%. Back to room and wants to sit in the recliner. Pt. verbalized comfortable at present and breathing better. Will cont. to monitor.

## 2012-12-11 ENCOUNTER — Encounter (HOSPITAL_COMMUNITY): Payer: Self-pay | Admitting: Vascular Surgery

## 2012-12-25 ENCOUNTER — Encounter: Payer: Self-pay | Admitting: Vascular Surgery

## 2012-12-26 ENCOUNTER — Ambulatory Visit (INDEPENDENT_AMBULATORY_CARE_PROVIDER_SITE_OTHER): Payer: No Typology Code available for payment source | Admitting: Vascular Surgery

## 2012-12-26 ENCOUNTER — Encounter: Payer: Self-pay | Admitting: Vascular Surgery

## 2012-12-26 DIAGNOSIS — I6529 Occlusion and stenosis of unspecified carotid artery: Secondary | ICD-10-CM

## 2012-12-26 DIAGNOSIS — Z48812 Encounter for surgical aftercare following surgery on the circulatory system: Secondary | ICD-10-CM

## 2012-12-26 NOTE — Progress Notes (Signed)
The patient presents today for followup of right carotid endarterectomy for severe asymptomatic right internal carotid artery stenosis. Surgery was on 12/08/2012. She did well and was discharged to home on postoperative day #1. She has the usual amount of. Incisional numbness. She has had minimal discomfort and has had no neurologic deficits.  Physical exam: Well-developed female in no acute distress. Neck incision on the right with atypical healing ridge. No evidence of drainage or erythema Neurologically grossly intact  Impression and plan: Stable status post right carotid endarterectomy for severe asymptomatic carotid stenosis. She will resume full normal activities. We will see her again in 6 months with repeat carotid duplex. She'll notify us for any wound issues or neurologic deficits.

## 2012-12-26 NOTE — Addendum Note (Signed)
Addended by: Adria Dill L on: 12/26/2012 03:29 PM   Modules accepted: Orders

## 2013-03-13 ENCOUNTER — Telehealth: Payer: Self-pay | Admitting: *Deleted

## 2013-03-13 NOTE — Telephone Encounter (Signed)
Message copied by Lesle Chris on Tue Mar 13, 2013  1:35 PM ------      Message from: Lesle Chris      Created: Wed Dec 06, 2012  2:14 PM       FLP, LFT ------

## 2013-03-13 NOTE — Telephone Encounter (Signed)
Patient has OV scheduled for 03/14/2013 with SM. Left message to return call with husband.  Will see if she wants to do these in the am, if PMD is managing, or if she prefers to wait till she sees Dr. Diona Browner tomorrow.

## 2013-03-14 ENCOUNTER — Encounter: Payer: Self-pay | Admitting: Cardiology

## 2013-03-14 ENCOUNTER — Ambulatory Visit (INDEPENDENT_AMBULATORY_CARE_PROVIDER_SITE_OTHER): Payer: No Typology Code available for payment source | Admitting: Cardiology

## 2013-03-14 VITALS — BP 108/71 | HR 56 | Ht 66.0 in | Wt 229.0 lb

## 2013-03-14 DIAGNOSIS — I5032 Chronic diastolic (congestive) heart failure: Secondary | ICD-10-CM

## 2013-03-14 DIAGNOSIS — I10 Essential (primary) hypertension: Secondary | ICD-10-CM

## 2013-03-14 DIAGNOSIS — I2510 Atherosclerotic heart disease of native coronary artery without angina pectoris: Secondary | ICD-10-CM

## 2013-03-14 DIAGNOSIS — E782 Mixed hyperlipidemia: Secondary | ICD-10-CM

## 2013-03-14 DIAGNOSIS — R1310 Dysphagia, unspecified: Secondary | ICD-10-CM | POA: Insufficient documentation

## 2013-03-14 DIAGNOSIS — I6529 Occlusion and stenosis of unspecified carotid artery: Secondary | ICD-10-CM

## 2013-03-14 MED ORDER — ATORVASTATIN CALCIUM 40 MG PO TABS: 40.0000 mg | ORAL_TABLET | Freq: Every day | ORAL | Status: DC

## 2013-03-14 NOTE — Assessment & Plan Note (Signed)
Also reporting epigastric and chest discomfort, reflux symptoms. She is already on a PPI. I suggested that she discuss with Dr. Sherril Croon a referral to gastroenterology for endoscopy.

## 2013-03-14 NOTE — Patient Instructions (Addendum)

## 2013-03-14 NOTE — Assessment & Plan Note (Signed)
Blood pressure is well-controlled today. 

## 2013-03-14 NOTE — Assessment & Plan Note (Signed)
Continues on Lipitor. Followed by Dr. Sherril Croon.

## 2013-03-14 NOTE — Assessment & Plan Note (Signed)
Weight is up today, but doubt that this is related to frank volume overload. We discussed a regular exercise regimen.

## 2013-03-14 NOTE — Progress Notes (Signed)
Clinical Summary Ms. Kingbird is a 72 y.o.female last seen in the office back in May by Mr. Serpe PA-C. She reports compliance with her medications. Still has chronic dyspnea on exertion. Also describes a burning discomfort in her epigastric and chest area, frank dysphasia, reflux symptoms. She is already on a PPI.  Exercise Cardiolite from April of this year was low risk, no ischemia, LVEF 65% at 81% MPHR. Subsequent right and left heart catheterization to further investigate DOE demonstrated PA systolic pressure 34, normal PCWP, no significant obstructive CAD, LVEF 60-65%.  Carotid disease is followed by Dr. Arbie Cookey status post right CEA in June.  Weight is up 8 pounds from last visit. She states she has not been exercising. We discussed considering a very basic walking regimen and gradually building herself up.   Allergies  Allergen Reactions  . Codeine Nausea And Vomiting  . Penicillins Rash    Current Outpatient Prescriptions  Medication Sig Dispense Refill  . amLODipine (NORVASC) 5 MG tablet Take 1 tablet (5 mg total) by mouth daily.  30 tablet  6  . aspirin EC 81 MG tablet Take 1 tablet (81 mg total) by mouth daily.      . benazepril (LOTENSIN) 40 MG tablet Take 40 mg by mouth daily.      . furosemide (LASIX) 40 MG tablet Take 1 tablet (40 mg total) by mouth daily.  30 tablet  6  . glipiZIDE (GLUCOTROL XL) 10 MG 24 hr tablet Take 10 mg by mouth daily.      . insulin aspart protamine- aspart (NOVOLOG 70/30) (70-30) 100 UNIT/ML injection Inject 48-58 Units into the skin 2 (two) times daily with a meal. 58 units in AM and 48 units in PM      . metFORMIN (GLUCOPHAGE) 1000 MG tablet Take 1,000 mg by mouth 2 (two) times daily with a meal.      . omeprazole (PRILOSEC) 20 MG capsule Take 20 mg by mouth daily.      . potassium chloride SA (K-DUR,KLOR-CON) 20 MEQ tablet Take 20 mEq by mouth daily.      Marland Kitchen triamterene-hydrochlorothiazide (DYAZIDE) 37.5-25 MG per capsule Take 1 capsule by mouth  every morning.      . venlafaxine XR (EFFEXOR-XR) 150 MG 24 hr capsule Take 150 mg by mouth daily.      Marland Kitchen atorvastatin (LIPITOR) 40 MG tablet Take 1 tablet (40 mg total) by mouth daily.  30 tablet  6   No current facility-administered medications for this visit.    Past Medical History  Diagnosis Date  . Coronary atherosclerosis of native coronary artery     Nonobstructive 08/2008  . PAD (peripheral artery disease)     Moderate right renal artery stenosis 08/2008  . Type 2 diabetes mellitus   . Essential hypertension, benign   . HLD (hyperlipidemia)   . Obesity   . Asthma   . DJD (degenerative joint disease)   . Chronic diastolic heart failure   . Sinus bradycardia     First degree atrioventricular block  . CKD (chronic kidney disease) stage 2, GFR 60-89 ml/min   . Carotid artery occlusion   . GERD (gastroesophageal reflux disease)     Social History Ms. Florance reports that she has never smoked. She has never used smokeless tobacco. Ms. Lagrange reports that she does not drink alcohol.  Review of Systems Negative except as outlined.  Physical Examination Filed Vitals:   03/14/13 1301  BP: 108/71  Pulse: 56  Filed Weights   03/14/13 1301  Weight: 229 lb (103.874 kg)   Overweight woman, appears comfortable at rest. HEENT: Conjunctiva and lids normal, oropharynx clear. Neck: Supple, no elevated JVP, s/p right CEA, no thyromegaly. Lungs: Clear to auscultation, nonlabored breathing at rest. Cardiac: Regular rate and rhythm, no S3 or significant systolic murmur, no pericardial rub. Abdomen: Soft, nontender, bowel sounds present. Extremities: No pitting edema, distal pulses 2+. Skin: Warm and dry. Musculoskeletal: No kyphosis. Neuropsychiatric: Alert and oriented x3, affect grossly appropriate.   Problem List and Plan   CORONARY ATHEROSCLEROSIS NATIVE CORONARY ARTERY No significant obstructive CAD at recent cardiac catheterization. Plan to continue medical therapy  and observation.  Essential hypertension, benign Blood pressure is well-controlled today.  Chronic diastolic heart failure Weight is up today, but doubt that this is related to frank volume overload. We discussed a regular exercise regimen.  Mixed hyperlipidemia Continues on Lipitor. Followed by Dr. Sherril Croon.  Dysphagia Also reporting epigastric and chest discomfort, reflux symptoms. She is already on a PPI. I suggested that she discuss with Dr. Sherril Croon a referral to gastroenterology for endoscopy.  Occlusion and stenosis of carotid artery without mention of cerebral infarction Status post right CEA in June.    Jonelle Sidle, M.D., F.A.C.C.

## 2013-03-14 NOTE — Assessment & Plan Note (Signed)
Status post right CEA in June.

## 2013-03-14 NOTE — Assessment & Plan Note (Signed)
No significant obstructive CAD at recent cardiac catheterization. Plan to continue medical therapy and observation.

## 2013-03-15 NOTE — Telephone Encounter (Signed)
Per Dr. Diona Browner dictation from OV yesterday, lipids are managed by PMD (Vyas).

## 2013-05-02 LAB — PULMONARY FUNCTION TEST

## 2013-05-21 ENCOUNTER — Other Ambulatory Visit: Payer: Self-pay | Admitting: Physician Assistant

## 2013-07-03 ENCOUNTER — Ambulatory Visit: Payer: No Typology Code available for payment source | Admitting: Vascular Surgery

## 2013-07-03 ENCOUNTER — Other Ambulatory Visit: Payer: No Typology Code available for payment source

## 2013-07-09 ENCOUNTER — Encounter: Payer: Self-pay | Admitting: Vascular Surgery

## 2013-07-10 ENCOUNTER — Ambulatory Visit (HOSPITAL_COMMUNITY)
Admission: RE | Admit: 2013-07-10 | Discharge: 2013-07-10 | Disposition: A | Discharge status: Home / Self Care | Payer: Medicare HMO | Source: Ambulatory Visit | Attending: Vascular Surgery | Admitting: Vascular Surgery

## 2013-07-10 ENCOUNTER — Encounter: Payer: Self-pay | Admitting: Vascular Surgery

## 2013-07-10 ENCOUNTER — Ambulatory Visit (INDEPENDENT_AMBULATORY_CARE_PROVIDER_SITE_OTHER): Payer: Medicare HMO | Admitting: Vascular Surgery

## 2013-07-10 DIAGNOSIS — I6529 Occlusion and stenosis of unspecified carotid artery: Secondary | ICD-10-CM

## 2013-07-10 DIAGNOSIS — Z48812 Encounter for surgical aftercare following surgery on the circulatory system: Secondary | ICD-10-CM | POA: Insufficient documentation

## 2013-07-10 NOTE — Addendum Note (Signed)
Addended by: Mena Goes on: 07/10/2013 12:52 PM   Modules accepted: Orders

## 2013-07-10 NOTE — Progress Notes (Signed)
The patient presents today for evaluation followup of her right carotid endarterectomy for severe asymptomatic disease in June of 2014. She's had no neurologic deficits. She's had complete resolution of the peri-incisional numbness after surgery. She does report that she continues to have some calf discomfort with walking but is able to discontinue walking. This does clinically sound like typical right leg calf claudication.  Past Medical History  Diagnosis Date  . Coronary atherosclerosis of native coronary artery     Nonobstructive 08/2008  . PAD (peripheral artery disease)     Moderate right renal artery stenosis 08/2008  . Type 2 diabetes mellitus   . Essential hypertension, benign   . HLD (hyperlipidemia)   . Obesity   . Asthma   . DJD (degenerative joint disease)   . Chronic diastolic heart failure   . Sinus bradycardia     First degree atrioventricular block  . CKD (chronic kidney disease) stage 2, GFR 60-89 ml/min   . Carotid artery occlusion   . GERD (gastroesophageal reflux disease)     History  Substance Use Topics  . Smoking status: Never Smoker   . Smokeless tobacco: Never Used  . Alcohol Use: No    Family History  Problem Relation Age of Onset  . Cancer Mother     Stomach  . Deep vein thrombosis Mother   . Diabetes Mother   . Hypertension Mother   . Heart disease Mother   . Hypertension Father   . Diabetes Sister   . Hyperlipidemia Sister   . Hypertension Sister   . Heart disease Sister   . Diabetes Brother   . Hyperlipidemia Brother   . Hypertension Brother     Allergies  Allergen Reactions  . Codeine Nausea And Vomiting  . Penicillins Rash    Current outpatient prescriptions:amLODipine (NORVASC) 5 MG tablet, Take 1 tablet (5 mg total) by mouth daily., Disp: 30 tablet, Rfl: 6;  aspirin EC 81 MG tablet, Take 1 tablet (81 mg total) by mouth daily., Disp: , Rfl: ;  atorvastatin (LIPITOR) 40 MG tablet, Take 1 tablet (40 mg total) by mouth daily., Disp:  30 tablet, Rfl: 6;  benazepril (LOTENSIN) 40 MG tablet, Take 40 mg by mouth daily., Disp: , Rfl:  furosemide (LASIX) 40 MG tablet, TAKE 1 TABLET BY MOUTH DAILY, Disp: 30 tablet, Rfl: 6;  glipiZIDE (GLUCOTROL XL) 10 MG 24 hr tablet, Take 10 mg by mouth daily., Disp: , Rfl: ;  insulin aspart protamine- aspart (NOVOLOG 70/30) (70-30) 100 UNIT/ML injection, Inject 48-58 Units into the skin 2 (two) times daily with a meal. 58 units in AM and 48 units in PM, Disp: , Rfl:  metFORMIN (GLUCOPHAGE) 1000 MG tablet, Take 1,000 mg by mouth 2 (two) times daily with a meal., Disp: , Rfl: ;  omeprazole (PRILOSEC) 20 MG capsule, Take 20 mg by mouth daily., Disp: , Rfl: ;  potassium chloride SA (K-DUR,KLOR-CON) 20 MEQ tablet, Take 20 mEq by mouth daily., Disp: , Rfl: ;  triamterene-hydrochlorothiazide (DYAZIDE) 37.5-25 MG per capsule, Take 1 capsule by mouth every morning., Disp: , Rfl:  venlafaxine XR (EFFEXOR-XR) 150 MG 24 hr capsule, Take 150 mg by mouth daily., Disp: , Rfl:   BP 165/63  Pulse 59  Ht 5\' 6"  (1.676 m)  Wt 232 lb 3.2 oz (105.325 kg)  BMI 37.50 kg/m2  SpO2 100%  Body mass index is 37.5 kg/(m^2).       Physical exam well-developed well-nourished female in no acute distress Right carotid incision  well-healed with no carotid bruits bilaterally 2+ radial 2+ femoral and 2+ popliteal pulses bilaterally. She does have diminished pedal pulses bilaterally Neurologically she is grossly intact Heart regular rate and rhythm without murmur  Carotid duplex today was interpreted by myself I did discuss this with the patient and her family present. This shows a widely patent endarterectomy with no evidence of recurrence on the right. She does have mild atherosclerotic changes on the left carotid artery  Impression and plan stable status post right carotid endarterectomy for severe asymptomatic disease. We will see her again in 6 months with carotid duplex and then dropped back to yearly ascending six-month  study is normal. She does have palpable popliteal pulses and therefore I would not recommend any other evaluation regarding her lower extremity symptoms. I discussed this at length with the patient explaining that with normal popliteal pulse I would be very unlikely defined treatment option if this is related to arterial claudication. She does not wish any further evaluation. We will see her in 6 months

## 2013-12-20 ENCOUNTER — Ambulatory Visit (INDEPENDENT_AMBULATORY_CARE_PROVIDER_SITE_OTHER): Payer: Medicare HMO | Admitting: Cardiology

## 2013-12-20 ENCOUNTER — Encounter: Payer: Self-pay | Admitting: Cardiology

## 2013-12-20 VITALS — BP 136/76 | HR 53 | Ht 66.0 in | Wt 232.8 lb

## 2013-12-20 DIAGNOSIS — I10 Essential (primary) hypertension: Secondary | ICD-10-CM

## 2013-12-20 DIAGNOSIS — E782 Mixed hyperlipidemia: Secondary | ICD-10-CM

## 2013-12-20 DIAGNOSIS — I2789 Other specified pulmonary heart diseases: Secondary | ICD-10-CM

## 2013-12-20 DIAGNOSIS — I2510 Atherosclerotic heart disease of native coronary artery without angina pectoris: Secondary | ICD-10-CM

## 2013-12-20 DIAGNOSIS — IMO0002 Reserved for concepts with insufficient information to code with codable children: Secondary | ICD-10-CM

## 2013-12-20 NOTE — Assessment & Plan Note (Signed)
Continues on Lipitor, followed by Dr. Woody Seller.

## 2013-12-20 NOTE — Assessment & Plan Note (Signed)
No change to current regimen. Keep followup with Dr. Woody Seller.

## 2013-12-20 NOTE — Assessment & Plan Note (Signed)
No significant obstructive CAD by heart catheterization in April 2014.

## 2013-12-20 NOTE — Patient Instructions (Signed)

## 2013-12-20 NOTE — Progress Notes (Signed)
Clinical Summary Miranda Sexton is a 73 y.o.female last seen in September 2014. Interval record review finds recent hospitalization at [facility] with shortness of breath. She was evaluated by the [facility] cardiology group. Lab work showed BUN 20, creatinine 1.0, potassium 4.2, troponin I negative, BNP 223, hemoglobin 9.3, platelets 371. CT angiography chest reported no evidence of pulmonary embolus with chronic elevation of the right hemidiaphragm and bibasilar atelectasis. No other acute findings. Echocardiogram reported LVEF 60-65% with grossly normal diastolic function and mild LVH, mild left atrial enlargement, mildly sclerotic aortic valve with trace aortic regurgitation, mild mitral regurgitation, RVSP 67 mm mercury.  Interval followup with Miranda Sexton in January showed widely patent CEA site.  Exercise Cardiolite from April 2014 was low risk, no ischemia, LVEF 65% at 81% MPHR. Subsequent right and left heart catheterization to further investigate DOE demonstrated PA systolic pressure 34, normal PCWP, no significant obstructive CAD, LVEF 60-65%.  Weight is stable compared to January. She reports a feeling of discomfort in her thorax and epigastric area when she walks. Has actually tried using her husband's oxygen sometimes and it seems to help.   Allergies  Allergen Reactions  . Codeine Nausea And Vomiting  . Penicillins Rash    Current Outpatient Prescriptions  Medication Sig Dispense Refill  . amLODipine (NORVASC) 5 MG tablet Take 1 tablet (5 mg total) by mouth daily.  30 tablet  6  . aspirin EC 81 MG tablet Take 1 tablet (81 mg total) by mouth daily.      Marland Kitchen atorvastatin (LIPITOR) 40 MG tablet Take 1 tablet (40 mg total) by mouth daily.  30 tablet  6  . benazepril (LOTENSIN) 40 MG tablet Take 40 mg by mouth daily.      . furosemide (LASIX) 40 MG tablet TAKE 1 TABLET BY MOUTH DAILY  30 tablet  6  . glipiZIDE (GLUCOTROL XL) 10 MG 24 hr tablet Take 10 mg by mouth daily.      . insulin  aspart protamine- aspart (NOVOLOG 70/30) (70-30) 100 UNIT/ML injection Inject 48-58 Units into the skin 2 (two) times daily with a meal. 58 units in AM and 48 units in PM      . metFORMIN (GLUCOPHAGE) 1000 MG tablet Take 1,000 mg by mouth 2 (two) times daily with a meal.      . omeprazole (PRILOSEC) 20 MG capsule Take 20 mg by mouth daily.      . potassium chloride SA (K-DUR,KLOR-CON) 20 MEQ tablet Take 20 mEq by mouth daily.      Marland Kitchen triamterene-hydrochlorothiazide (DYAZIDE) 37.5-25 MG per capsule Take 1 capsule by mouth every morning.      . venlafaxine XR (EFFEXOR-XR) 150 MG 24 hr capsule Take 150 mg by mouth daily.       No current facility-administered medications for this visit.    Past Medical History  Diagnosis Date  . Coronary atherosclerosis of native coronary artery     Nonobstructive 08/2008  . PAD (peripheral artery disease)     Moderate right renal artery stenosis 08/2008  . Type 2 diabetes mellitus   . Essential hypertension, benign   . HLD (hyperlipidemia)   . Obesity   . Asthma   . DJD (degenerative joint disease)   . Chronic diastolic heart failure   . Sinus bradycardia     First degree atrioventricular block  . CKD (chronic kidney disease) stage 2, GFR 60-89 ml/min   . Carotid artery occlusion   . GERD (gastroesophageal reflux disease)  Past Surgical History  Procedure Laterality Date  . Endarterectomy Right 12/08/2012    Procedure: ENDARTERECTOMY CAROTID;  Surgeon: Miranda Posner, MD;  Location: [place];  Service: Vascular;  Laterality: Right;    Social History Miranda Sexton reports that she has never smoked. She has never used smokeless tobacco. Miranda Sexton reports that she does not drink alcohol.  Review of Systems No palpitations or syncope. Reports compliance with her medications. No bleeding problems. Other systems reviewed and negative.  Physical Examination Filed Vitals:   12/20/13 1320  BP: 136/76  Pulse: 53   Filed Weights   12/20/13 1320  Weight:  232 lb 12.8 oz (105.597 kg)    Overweight woman, appears comfortable at rest.  HEENT: Conjunctiva and lids normal, oropharynx clear.  Neck: Supple, no elevated JVP, s/p right CEA, no thyromegaly.  Lungs: Clear to auscultation, nonlabored breathing at rest.  Cardiac: Regular rate and rhythm, no S3 or significant systolic murmur, no pericardial rub.  Abdomen: Soft, nontender, bowel sounds present.  Extremities: No pitting edema, distal pulses 2+.  Skin: Warm and dry.  Musculoskeletal: No kyphosis.  Neuropsychiatric: Alert and oriented x3, affect grossly appropriate.   Problem List and Plan   Secondary pulmonary hypertension Likely contributing to some of her exertional symptoms. Recent echocardiogram showed normal LVEF and described actually normal diastolic function. She has had previous right heart catheterization with PA pressure 34 mm mercury last year. I have recommended that she talk with Miranda Sexton about considering pulmonary function tests and a 6 minute walk test, mainly to exclude exertional hypoxia that might require oxygen supplementation. No further cardiac testing at this time.  CORONARY ATHEROSCLEROSIS NATIVE CORONARY ARTERY No significant obstructive CAD by heart catheterization in April 2014.  Essential hypertension, benign No change to current regimen. Keep followup with Miranda Sexton.  Mixed hyperlipidemia Continues on Lipitor, followed by Miranda Sexton.    Miranda Sark, M.D., F.A.C.C.

## 2013-12-20 NOTE — Assessment & Plan Note (Signed)
Likely contributing to some of her exertional symptoms. Recent echocardiogram showed normal LVEF and described actually normal diastolic function. She has had previous right heart catheterization with PA pressure 34 mm mercury last year. I have recommended that she talk with Dr. Woody Seller about considering pulmonary function tests and a 6 minute walk test, mainly to exclude exertional hypoxia that might require oxygen supplementation. No further cardiac testing at this time.

## 2013-12-24 ENCOUNTER — Other Ambulatory Visit: Payer: Self-pay | Admitting: *Deleted

## 2013-12-24 MED ORDER — ATORVASTATIN CALCIUM 40 MG PO TABS: 40.0000 mg | ORAL_TABLET | Freq: Every day | ORAL | Status: DC

## 2014-01-07 ENCOUNTER — Other Ambulatory Visit (HOSPITAL_COMMUNITY): Payer: Medicare HMO

## 2014-01-07 ENCOUNTER — Ambulatory Visit: Payer: Medicare HMO | Admitting: Family

## 2014-01-14 ENCOUNTER — Encounter: Payer: Self-pay | Admitting: Family

## 2014-01-15 ENCOUNTER — Other Ambulatory Visit (HOSPITAL_COMMUNITY): Payer: Medicare HMO

## 2014-01-15 ENCOUNTER — Encounter: Payer: Self-pay | Admitting: Family

## 2014-01-15 ENCOUNTER — Ambulatory Visit (INDEPENDENT_AMBULATORY_CARE_PROVIDER_SITE_OTHER): Payer: Medicare HMO | Admitting: Family

## 2014-01-15 ENCOUNTER — Ambulatory Visit: Payer: Medicare HMO | Admitting: Family

## 2014-01-15 ENCOUNTER — Ambulatory Visit (HOSPITAL_COMMUNITY)
Admission: RE | Admit: 2014-01-15 | Discharge: 2014-01-15 | Disposition: A | Discharge status: Home / Self Care | Payer: Medicare HMO | Source: Ambulatory Visit | Attending: Family | Admitting: Family

## 2014-01-15 VITALS — BP 142/71 | HR 60 | Resp 14 | Ht 66.0 in | Wt 231.0 lb

## 2014-01-15 DIAGNOSIS — I6529 Occlusion and stenosis of unspecified carotid artery: Secondary | ICD-10-CM

## 2014-01-15 NOTE — Patient Instructions (Signed)
Stroke Prevention Some medical conditions and behaviors are associated with an increased chance of having a stroke. You may prevent a stroke by making healthy choices and managing medical conditions. HOW CAN I REDUCE MY RISK OF HAVING A STROKE?   Stay physically active. Get at least 30 minutes of activity on most or all days.  Do not smoke. It may also be helpful to avoid exposure to secondhand smoke.  Limit alcohol use. Moderate alcohol use is considered to be:  No more than 2 drinks per day for men.  No more than 1 drink per day for nonpregnant women.  Eat healthy foods. This involves  Eating 5 or more servings of fruits and vegetables a day.  Following a diet that addresses high blood pressure (hypertension), high cholesterol, diabetes, or obesity.  Manage your cholesterol levels.  A diet low in saturated fat, trans fat, and cholesterol and high in fiber may control cholesterol levels.  Take any prescribed medicines to control cholesterol as directed by your health care provider.  Manage your diabetes.  A controlled-carbohydrate, controlled-sugar diet is recommended to manage diabetes.  Take any prescribed medicines to control diabetes as directed by your health care provider.  Control your hypertension.  A low-salt (sodium), low-saturated fat, low-trans fat, and low-cholesterol diet is recommended to manage hypertension.  Take any prescribed medicines to control hypertension as directed by your health care provider.  Maintain a healthy weight.  A reduced-calorie, low-sodium, low-saturated fat, low-trans fat, low-cholesterol diet is recommended to manage weight.  Stop drug abuse.  Avoid taking birth control pills.  Talk to your health care provider about the risks of taking birth control pills if you are over 35 years old, smoke, get migraines, or have ever had a blood clot.  Get evaluated for sleep disorders (sleep apnea).  Talk to your health care provider about  getting a sleep evaluation if you snore a lot or have excessive sleepiness.  Take medicines as directed by your health care provider.  For some people, aspirin or blood thinners (anticoagulants) are helpful in reducing the risk of forming abnormal blood clots that can lead to stroke. If you have the irregular heart rhythm of atrial fibrillation, you should be on a blood thinner unless there is a good reason you cannot take them.  Understand all your medicine instructions.  Make sure that other other conditions (such as anemia or atherosclerosis) are addressed. SEEK IMMEDIATE MEDICAL CARE IF:   You have sudden weakness or numbness of the face, arm, or leg, especially on one side of the body.  Your face or eyelid droops to one side.  You have sudden confusion.  You have trouble speaking (aphasia) or understanding.  You have sudden trouble seeing in one or both eyes.  You have sudden trouble walking.  You have dizziness.  You have a loss of balance or coordination.  You have a sudden, severe headache with no known cause.  You have new chest pain or an irregular heartbeat. Any of these symptoms may represent a serious problem that is an emergency. Do not wait to see if the symptoms will go away. Get medical help at once. Call your local emergency services  (911 in [place] Do not drive yourself to the hospital. Document Released: 07/29/2004 Document Revised: 04/11/2013 Document Reviewed: 12/22/2012 Exit[employer] Patient Information 2015 [employer]. This information is not intended to replace advice given to you by your health care provider. Make sure you discuss any questions you have with your health   care provider.  

## 2014-01-15 NOTE — Progress Notes (Signed)
Established Carotid Patient   History of Present Illness  Miranda Sexton is a 73 y.o. female patient of Dr. Donnetta Hutching who is status post right carotid endarterectomy on 12/08/12 for severe asymptomatic disease. She had palpable popliteal pulses and Dr. Donnetta Hutching did not recommend any other evaluation regarding her lower extremity claudication symptoms at her January, 2015 visit. Dr. Donnetta Hutching discussed this with the patient explaining that with normal popliteal pulses, this is not likely related to arterial claudication. She returns today for carotid artery surveillance.  Patient has Negative history of TIA or stroke symptom.  The patient denies amaurosis fugax or monocular blindness.  The patient  denies facial drooping.  Pt. denies hemiplegia.  The patient denies receptive or expressive aphasia.  Pt. denies extremity weakness.   Pt denies New Medical or Surgical History.  Pt Diabetic: Yes, states her last A1C WAS 7.5. Pt smoker: non-smoker  Pt meds include: Statin : Yes ASA: Yes Other anticoagulants/antiplatelets: no   Past Medical History  Diagnosis Date  . Coronary atherosclerosis of native coronary artery     Nonobstructive 08/2008  . PAD (peripheral artery disease)     Moderate right renal artery stenosis 08/2008  . Type 2 diabetes mellitus   . Essential hypertension, benign   . HLD (hyperlipidemia)   . Obesity   . Asthma   . DJD (degenerative joint disease)   . Chronic diastolic heart failure   . Sinus bradycardia     First degree atrioventricular block  . CKD (chronic kidney disease) stage 2, GFR 60-89 ml/min   . Carotid artery occlusion   . GERD (gastroesophageal reflux disease)     Social History History  Substance Use Topics  . Smoking status: Never Smoker   . Smokeless tobacco: Never Used  . Alcohol Use: No    Family History Family History  Problem Relation Age of Onset  . Cancer Mother     Stomach  . Deep vein thrombosis Mother   . Diabetes Mother   .  Hypertension Mother   . Heart disease Mother   . Hypertension Father   . Diabetes Sister   . Hyperlipidemia Sister   . Hypertension Sister   . Heart disease Sister   . Diabetes Brother   . Hyperlipidemia Brother   . Hypertension Brother     Surgical History Past Surgical History  Procedure Laterality Date  . Endarterectomy Right 12/08/2012    Procedure: ENDARTERECTOMY CAROTID;  Surgeon: [name], MD;  Location: [facility];  Service: Vascular;  Laterality: Right;    Allergies  Allergen Reactions  . Codeine Nausea And Vomiting  . Penicillins Rash    Current Outpatient Prescriptions  Medication Sig Dispense Refill  . amLODipine (NORVASC) 5 MG tablet Take 1 tablet (5 mg total) by mouth daily.  30 tablet  6  . aspirin EC 81 MG tablet Take 1 tablet (81 mg total) by mouth daily.      Marland Kitchen atorvastatin (LIPITOR) 40 MG tablet Take 1 tablet (40 mg total) by mouth daily.  30 tablet  1  . benazepril (LOTENSIN) 40 MG tablet Take 40 mg by mouth daily.      . furosemide (LASIX) 40 MG tablet TAKE 1 TABLET BY MOUTH DAILY  30 tablet  6  . glipiZIDE (GLUCOTROL XL) 10 MG 24 hr tablet Take 10 mg by mouth daily.      . insulin aspart protamine- aspart (NOVOLOG 70/30) (70-30) 100 UNIT/ML injection Inject 48-58 Units into the skin 2 (two) times  daily with a meal. 58 units in AM and 48 units in PM      . metFORMIN (GLUCOPHAGE) 1000 MG tablet Take 1,000 mg by mouth 2 (two) times daily with a meal.      . omeprazole (PRILOSEC) 20 MG capsule Take 20 mg by mouth daily.      . potassium chloride SA (K-DUR,KLOR-CON) 20 MEQ tablet Take 20 mEq by mouth daily.      Marland Kitchen triamterene-hydrochlorothiazide (DYAZIDE) 37.5-25 MG per capsule Take 1 capsule by mouth every morning.      . venlafaxine XR (EFFEXOR-XR) 150 MG 24 hr capsule Take 150 mg by mouth daily.       No current facility-administered medications for this visit.    Review of Systems : See HPI for pertinent positives and negatives.  Physical  Examination  Filed Vitals:   01/15/14 1428 01/15/14 1430  BP: 144/77 142/71  Pulse: 55 60  Resp: 14   Height: 5\' 6"  (1.676 m)   Weight: 231 lb (104.781 kg)   Body mass index is 37.3 kg/(m^2).  General: WDWN obese female in NAD GAIT: normal Eyes: PERRLA Pulmonary:  Non-labored, CTAB, Negative  Rales, Negative rhonchi, & Negative wheezing.  Cardiac: regular Rhythm ,  Negative detected murmur.  VASCULAR EXAM Carotid Bruits Left Right   Negative Negative    Radial pulses are 2+ palpable and equal.                                                                                                                      Gastrointestinal: soft, nontender, BS WNL, no r/g,  negative masses.  Musculoskeletal: Negative muscle atrophy/wasting. M/S 5/5 throughout, Extremities without ischemic changes.  Neurologic: A&O X 3; Appropriate Affect ; SENSATION ;normal;  Speech is normal CN 2-12 intact, Pain and light touch intact in extremities, Motor exam as listed above.   Non-Invasive Vascular Imaging CAROTID DUPLEX 01/15/2014 CEREBROVASCULAR DUPLEX EVALUATION    INDICATION: Follow-up right carotid endarterectomy     PREVIOUS INTERVENTION(S): 12/08/2012    DUPLEX EXAM:     RIGHT  LEFT  Peak Systolic Velocities (cm/s) End Diastolic Velocities (cm/s) Plaque LOCATION Peak Systolic Velocities (cm/s) End Diastolic Velocities (cm/s) Plaque  108 15  CCA PROXIMAL 102 19   76 16  CCA MID 60 15   71 15  CCA DISTAL 70 20   103 14  ECA 111 24   59 13 HT ICA PROXIMAL 123 37 HT  106 25  ICA MID 88 28   97 31  ICA DISTAL 130 32     NA ICA / CCA Ratio (PSV) 1.8  Antegrade  Vertebral Flow Antegrade    Brachial Systolic Pressure (mmHg)   Within normal limits  Brachial Artery Waveforms Within normal limits     Plaque Morphology:  HM = Homogeneous, HT = Heterogeneous, CP = Calcific Plaque, SP = Smooth Plaque, IP = Irregular Plaque     ADDITIONAL FINDINGS:     IMPRESSION: 1. Widely patent right  carotid endarterectomy without evidence of restenosis or hyperplasia. 2. Evidence of <40% left internal carotid artery stenosis. 3. Bilateral vertebral artery is antegrade.     Compared to the previous exam:  No significant change compared to prior exam.     Assessment: JESSEL GETTINGER is a 74 y.o. female who is status post right carotid endarterectomy on 12/08/12 and presents with asymptomatic widely patent right carotid endarterectomy without evidence of restenosis or hyperplasia. Evidence of <40% left internal carotid artery stenosis.  The  ICA stenosis is  Unchanged from previous exam.  Plan: Follow-up in 1 year with Carotid Duplex scan.   I discussed in depth with the patient the nature of atherosclerosis, and emphasized the importance of maximal medical management including strict control of blood pressure, blood glucose, and lipid levels, obtaining regular exercise, and continued cessation of smoking.  The patient is aware that without maximal medical management the underlying atherosclerotic disease process will progress, limiting the benefit of any interventions. The patient was given information about stroke prevention and what symptoms should prompt the patient to seek immediate medical care. Thank you for allowing [place] to participate in this patient's care.  Clemon Chambers, RN, MSN, FNP-C [facility] Office: [phone]  Clinic Physician: Kellie Simmering  01/15/2014 2:23 PM

## 2014-01-15 NOTE — Addendum Note (Signed)
Addended by: Mena Goes on: 01/15/2014 03:17 PM   Modules accepted: Orders

## 2014-05-27 ENCOUNTER — Other Ambulatory Visit: Payer: Self-pay | Admitting: *Deleted

## 2014-05-27 MED ORDER — FUROSEMIDE 40 MG PO TABS: 40.0000 mg | ORAL_TABLET | Freq: Every day | ORAL | Status: DC

## 2014-07-30 ENCOUNTER — Encounter: Payer: Self-pay | Admitting: Cardiology

## 2014-07-30 ENCOUNTER — Encounter: Payer: Medicare HMO | Admitting: Cardiology

## 2014-07-30 NOTE — Progress Notes (Signed)
No show  This encounter was created in error - please disregard.

## 2014-12-27 ENCOUNTER — Other Ambulatory Visit: Payer: Self-pay | Admitting: *Deleted

## 2014-12-27 MED ORDER — FUROSEMIDE 40 MG PO TABS: 40.0000 mg | ORAL_TABLET | Freq: Every day | ORAL | Status: DC

## 2015-01-17 ENCOUNTER — Encounter: Payer: Self-pay | Admitting: Family

## 2015-01-21 ENCOUNTER — Encounter (HOSPITAL_COMMUNITY): Payer: Medicare HMO

## 2015-01-21 ENCOUNTER — Ambulatory Visit: Payer: Medicare HMO | Admitting: Family

## 2015-02-07 ENCOUNTER — Ambulatory Visit: Payer: Medicare HMO | Admitting: Family

## 2015-02-07 ENCOUNTER — Encounter (HOSPITAL_COMMUNITY): Payer: Medicare HMO

## 2015-03-06 ENCOUNTER — Encounter: Payer: Self-pay | Admitting: Family

## 2015-03-06 ENCOUNTER — Other Ambulatory Visit: Payer: Self-pay | Admitting: Family

## 2015-03-06 DIAGNOSIS — I6523 Occlusion and stenosis of bilateral carotid arteries: Secondary | ICD-10-CM

## 2015-03-07 ENCOUNTER — Ambulatory Visit (INDEPENDENT_AMBULATORY_CARE_PROVIDER_SITE_OTHER): Payer: Medicare HMO | Admitting: Family

## 2015-03-07 ENCOUNTER — Ambulatory Visit (HOSPITAL_COMMUNITY)
Admission: RE | Admit: 2015-03-07 | Discharge: 2015-03-07 | Disposition: A | Discharge status: Home / Self Care | Payer: Medicare HMO | Source: Ambulatory Visit | Attending: Family | Admitting: Family

## 2015-03-07 ENCOUNTER — Encounter: Payer: Self-pay | Admitting: Family

## 2015-03-07 VITALS — BP 171/72 | HR 59 | Temp 97.6°F | Resp 16 | Ht 66.0 in | Wt 231.0 lb

## 2015-03-07 DIAGNOSIS — R030 Elevated blood-pressure reading, without diagnosis of hypertension: Secondary | ICD-10-CM

## 2015-03-07 DIAGNOSIS — I10 Essential (primary) hypertension: Secondary | ICD-10-CM | POA: Insufficient documentation

## 2015-03-07 DIAGNOSIS — I6522 Occlusion and stenosis of left carotid artery: Secondary | ICD-10-CM | POA: Diagnosis not present

## 2015-03-07 DIAGNOSIS — I6523 Occlusion and stenosis of bilateral carotid arteries: Secondary | ICD-10-CM

## 2015-03-07 DIAGNOSIS — E119 Type 2 diabetes mellitus without complications: Secondary | ICD-10-CM | POA: Insufficient documentation

## 2015-03-07 DIAGNOSIS — Z9889 Other specified postprocedural states: Secondary | ICD-10-CM

## 2015-03-07 DIAGNOSIS — E785 Hyperlipidemia, unspecified: Secondary | ICD-10-CM | POA: Diagnosis not present

## 2015-03-07 DIAGNOSIS — IMO0001 Reserved for inherently not codable concepts without codable children: Secondary | ICD-10-CM

## 2015-03-07 DIAGNOSIS — Z48812 Encounter for surgical aftercare following surgery on the circulatory system: Secondary | ICD-10-CM

## 2015-03-07 NOTE — Patient Instructions (Signed)
Stroke Prevention Some medical conditions and behaviors are associated with an increased chance of having a stroke. You may prevent a stroke by making healthy choices and managing medical conditions. HOW CAN I REDUCE MY RISK OF HAVING A STROKE?   Stay physically active. Get at least 30 minutes of activity on most or all days.  Do not smoke. It may also be helpful to avoid exposure to secondhand smoke.  Limit alcohol use. Moderate alcohol use is considered to be:  No more than 2 drinks per day for men.  No more than 1 drink per day for nonpregnant women.  Eat healthy foods. This involves:  Eating 5 or more servings of fruits and vegetables a day.  Making dietary changes that address high blood pressure (hypertension), high cholesterol, diabetes, or obesity.  Manage your cholesterol levels.  Making food choices that are high in fiber and low in saturated fat, trans fat, and cholesterol may control cholesterol levels.  Take any prescribed medicines to control cholesterol as directed by your health care provider.  Manage your diabetes.  Controlling your carbohydrate and sugar intake is recommended to manage diabetes.  Take any prescribed medicines to control diabetes as directed by your health care provider.  Control your hypertension.  Making food choices that are low in salt (sodium), saturated fat, trans fat, and cholesterol is recommended to manage hypertension.  Take any prescribed medicines to control hypertension as directed by your health care provider.  Maintain a healthy weight.  Reducing calorie intake and making food choices that are low in sodium, saturated fat, trans fat, and cholesterol are recommended to manage weight.  Stop drug abuse.  Avoid taking birth control pills.  Talk to your health care provider about the risks of taking birth control pills if you are over 35 years old, smoke, get migraines, or have ever had a blood clot.  Get evaluated for sleep  disorders (sleep apnea).  Talk to your health care provider about getting a sleep evaluation if you snore a lot or have excessive sleepiness.  Take medicines only as directed by your health care provider.  For some people, aspirin or blood thinners (anticoagulants) are helpful in reducing the risk of forming abnormal blood clots that can lead to stroke. If you have the irregular heart rhythm of atrial fibrillation, you should be on a blood thinner unless there is a good reason you cannot take them.  Understand all your medicine instructions.  Make sure that other conditions (such as anemia or atherosclerosis) are addressed. SEEK IMMEDIATE MEDICAL CARE IF:   You have sudden weakness or numbness of the face, arm, or leg, especially on one side of the body.  Your face or eyelid droops to one side.  You have sudden confusion.  You have trouble speaking (aphasia) or understanding.  You have sudden trouble seeing in one or both eyes.  You have sudden trouble walking.  You have dizziness.  You have a loss of balance or coordination.  You have a sudden, severe headache with no known cause.  You have new chest pain or an irregular heartbeat. Any of these symptoms may represent a serious problem that is an emergency. Do not wait to see if the symptoms will go away. Get medical help at once. Call your local emergency services (911 in [place] Do not drive yourself to the hospital. Document Released: 07/29/2004 Document Revised: 11/05/2013 Document Reviewed: 12/22/2012 Exit[employer] Patient Information 2015 [employer]. This information is not intended to replace advice given   to you by your health care provider. Make sure you discuss any questions you have with your health care provider.  

## 2015-03-07 NOTE — Progress Notes (Signed)
Filed Vitals:   03/07/15 1513 03/07/15 1522 03/07/15 1523  BP: 173/70 154/69 171/72  Pulse: 59 59 59  Temp: 97.6 F (36.4 C)    Resp: 16    Height: 5\' 6"  (1.676 m)    Weight: 231 lb (104.781 kg)    SpO2: 99%

## 2015-03-07 NOTE — Progress Notes (Signed)
Established Carotid Patient   History of Present Illness  Miranda Sexton is a 74 y.o. female patient of Dr. Donnetta Hutching who is status post right carotid endarterectomy on 12/08/12 for severe asymptomatic disease.  She returns today for carotid artery surveillance.  Patient has no history of TIA or stroke symptoms. Specifically the patient denies any history of amaurosis fugax or monocular blindness, unilateral facial drooping, hemiparesis, or receptive or expressive aphasia.  She does not have claudication symptoms with walking, denies non healing wounds. She reports that she does a great deal of walking and stair climbing in her house.  Dr. Woody Seller is her PCP.  Pt denies New Medical or Surgical History.  Pt Diabetic: Yes, states her last A1C was 6.? per pt, improved from 7.5. Pt smoker: non-smoker  Pt meds include: Statin : Yes ASA: Yes Other anticoagulants/antiplatelets: no   Past Medical History  Diagnosis Date  . Coronary atherosclerosis of native coronary artery     Nonobstructive 08/2008  . PAD (peripheral artery disease)     Moderate right renal artery stenosis 08/2008  . Type 2 diabetes mellitus   . Essential hypertension, benign   . HLD (hyperlipidemia)   . Obesity   . Asthma   . DJD (degenerative joint disease)   . Chronic diastolic heart failure   . Sinus bradycardia     First degree atrioventricular block  . CKD (chronic kidney disease) stage 2, GFR 60-89 ml/min   . Carotid artery occlusion   . GERD (gastroesophageal reflux disease)     Social History Social History  Substance Use Topics  . Smoking status: Never Smoker   . Smokeless tobacco: Never Used  . Alcohol Use: No    Family History Family History  Problem Relation Age of Onset  . Cancer Mother     Stomach  . Deep vein thrombosis Mother   . Diabetes Mother   . Hypertension Mother   . Heart disease Mother   . Hypertension Father   . Diabetes Sister   . Hyperlipidemia Sister   . Hypertension  Sister   . Heart disease Sister   . Diabetes Brother   . Hyperlipidemia Brother   . Hypertension Brother     Surgical History Past Surgical History  Procedure Laterality Date  . Endarterectomy Right 12/08/2012    Procedure: ENDARTERECTOMY CAROTID;  Surgeon: [name], MD;  Location: [facility];  Service: Vascular;  Laterality: Right;    Allergies  Allergen Reactions  . Codeine Nausea And Vomiting  . Penicillins Rash    Current Outpatient Prescriptions  Medication Sig Dispense Refill  . amLODipine (NORVASC) 5 MG tablet Take 1 tablet (5 mg total) by mouth daily. 30 tablet 6  . aspirin EC 81 MG tablet Take 1 tablet (81 mg total) by mouth daily.    Marland Kitchen atorvastatin (LIPITOR) 40 MG tablet Take 1 tablet (40 mg total) by mouth daily. 30 tablet 1  . benazepril (LOTENSIN) 40 MG tablet Take 40 mg by mouth daily.    . furosemide (LASIX) 40 MG tablet Take 1 tablet (40 mg total) by mouth daily. 30 tablet 0  . glipiZIDE (GLUCOTROL XL) 10 MG 24 hr tablet Take 10 mg by mouth daily.    . insulin aspart protamine- aspart (NOVOLOG 70/30) (70-30) 100 UNIT/ML injection Inject 48-58 Units into the skin 2 (two) times daily with a meal. 58 units in AM and 48 units in PM    . metFORMIN (GLUCOPHAGE) 1000 MG tablet Take 1,000 mg by  mouth 2 (two) times daily with a meal.    . omeprazole (PRILOSEC) 20 MG capsule Take 20 mg by mouth daily.    . potassium chloride SA (K-DUR,KLOR-CON) 20 MEQ tablet Take 20 mEq by mouth daily.    Marland Kitchen triamterene-hydrochlorothiazide (DYAZIDE) 37.5-25 MG per capsule Take 1 capsule by mouth every morning.    . venlafaxine XR (EFFEXOR-XR) 150 MG 24 hr capsule Take 150 mg by mouth daily.     No current facility-administered medications for this visit.    Review of Systems : See HPI for pertinent positives and negatives.  Physical Examination  Filed Vitals:   03/07/15 1513 03/07/15 1522 03/07/15 1523  BP: 173/70 154/69 171/72  Pulse: 59 59 59  Temp: 97.6 F (36.4 C)    Resp: 16     Height: 5\' 6"  (1.676 m)    Weight: 231 lb (104.781 kg)    SpO2: 99%     Body mass index is 37.3 kg/(m^2).   General: WDWN obese female in NAD GAIT: normal Eyes: PERRLA Pulmonary: Non-labored, CTAB, no rales, no rhonchi, & no wheezing.  Cardiac: regular rhythm, no detected murmur.  VASCULAR EXAM Carotid Bruits Left Right   Negative Negative   Radial pulses are 2+ palpable and equal.  Pedal pulses are not palpable but feet are warm with no signs of ischemia.    Gastrointestinal: soft, nontender, BS WNL, no r/g,no palpable masses.  Musculoskeletal: Negative muscle atrophy/wasting. M/S 5/5 throughout, Extremities without ischemic changes.  Neurologic: A&O X 3; Appropriate Affect, Speech is normal CN 2-12 intact, Pain and light touch intact in extremities, Motor exam as listed above.         Non-Invasive Vascular Imaging CAROTID DUPLEX 03/07/2015   CEREBROVASCULAR DUPLEX EVALUATION    INDICATION: Follow-up right carotid endarterectomy     PREVIOUS INTERVENTION(S): 12/08/2012    DUPLEX EXAM:     RIGHT  LEFT  Peak Systolic Velocities (cm/s) End Diastolic Velocities (cm/s) Plaque LOCATION Peak Systolic Velocities (cm/s) End Diastolic Velocities (cm/s) Plaque  77 12  CCA PROXIMAL 65 09   60 13  CCA MID 64 12   58 11  CCA DISTAL 71 16   71 07  ECA 94 19   50 13 HT ICA PROXIMAL 118 30 HT  54 15  ICA MID 66 12   51 14  ICA DISTAL 62 12     NA ICA / CCA Ratio (PSV) 1.6  Antegrade  Vertebral Flow Antegrade    Brachial Systolic Pressure (mmHg)   Within normal limits  Brachial Artery Waveforms Within normal limits     Plaque Morphology:  HM = Homogeneous, HT = Heterogeneous, CP = Calcific Plaque, SP = Smooth Plaque, IP = Irregular Plaque  ADDITIONAL FINDINGS:     IMPRESSION: 1. Widely patent right carotid endarterectomy without evidence  of restenosis or hyperplasia. 2. Evidence of <40% left internal carotid artery stenosis. 3. Bilateral vertebral artery is antegrade.     Compared to the previous exam:  No significant change compared to prior exam.      Assessment: Miranda Sexton is a 74 y.o. female who is status post right carotid endarterectomy on 12/08/12. She has no history of stroke or TIA. Today's carotid duplex suggests a widely patent right carotid endarterectomy without evidence of restenosis or hyperplasia and <40% left internal carotid artery stenosis. No significant change compared to prior exam.  Pt's atherosclerotic risk factors include controled DM, hypertension, and obesity. Fortunately she has no history of tobacco  use and walks a great deal.  Pt is advised to see her PCP ASAP re her elevated blood pressure.   Plan: Follow-up in 1 year with Carotid Duplex.   I discussed in depth with the patient the nature of atherosclerosis, and emphasized the importance of maximal medical management including strict control of blood pressure, blood glucose, and lipid levels, obtaining regular exercise, and continued cessation of smoking.  The patient is aware that without maximal medical management the underlying atherosclerotic disease process will progress, limiting the benefit of any interventions. The patient was given information about stroke prevention and what symptoms should prompt the patient to seek immediate medical care. Thank you for allowing [place] to participate in this patient's care.  Clemon Chambers, RN, MSN, FNP-C [facility] Office: [phone]  Clinic Physician: Bridgett Larsson  03/07/2015 3:28 PM

## 2015-03-11 NOTE — Addendum Note (Signed)
Addended by: Dorthula Rue L on: 03/11/2015 01:24 PM   Modules accepted: Orders

## 2015-10-20 ENCOUNTER — Other Ambulatory Visit: Payer: Self-pay | Admitting: Cardiology

## 2016-02-04 ENCOUNTER — Encounter: Payer: Self-pay | Admitting: Cardiology

## 2016-02-04 ENCOUNTER — Ambulatory Visit (INDEPENDENT_AMBULATORY_CARE_PROVIDER_SITE_OTHER): Payer: Medicare HMO | Admitting: Cardiology

## 2016-02-04 VITALS — BP 149/76 | HR 51 | Ht 68.0 in | Wt 238.4 lb

## 2016-02-04 DIAGNOSIS — R001 Bradycardia, unspecified: Secondary | ICD-10-CM

## 2016-02-04 NOTE — Patient Instructions (Signed)
Your physician recommends that you schedule a follow-up appointment in: [place] [address] [place]  Your physician recommends that you continue on your current medications as directed. Please refer to the Current Medication list given to you today.  You have been referred to DR. ALLRED   Thank you for choosing [facility]!!

## 2016-02-04 NOTE — Progress Notes (Signed)
Clinical Summary Ms. Hoberg is a 75 y.o.female last seen by Dr Domenic Polite, this is our first visit together. This is a focused visit on recent symptoms of dizziness and bradycardia.    1. Bradycardia/Dizziness - over the last year significant dizziness/lightheadness. Often with standing, but also occurs with sitting or laying down. Reports significant fatigue.    Past Medical History:  Diagnosis Date  . Asthma   . Carotid artery occlusion   . Chronic diastolic heart failure   . CKD (chronic kidney disease) stage 2, GFR 60-89 ml/min   . Coronary atherosclerosis of native coronary artery    Nonobstructive 08/2008  . DJD (degenerative joint disease)   . Essential hypertension, benign   . GERD (gastroesophageal reflux disease)   . HLD (hyperlipidemia)   . Obesity   . PAD (peripheral artery disease)    Moderate right renal artery stenosis 08/2008  . Sinus bradycardia    First degree atrioventricular block  . Type 2 diabetes mellitus      Allergies  Allergen Reactions  . Codeine Nausea And Vomiting  . Penicillins Rash     Current Outpatient Prescriptions  Medication Sig Dispense Refill  . amLODipine (NORVASC) 5 MG tablet Take 1 tablet (5 mg total) by mouth daily. 30 tablet 6  . aspirin EC 81 MG tablet Take 1 tablet (81 mg total) by mouth daily.    Marland Kitchen atorvastatin (LIPITOR) 40 MG tablet Take 1 tablet (40 mg total) by mouth daily. 30 tablet 1  . benazepril (LOTENSIN) 40 MG tablet Take 40 mg by mouth daily.    . furosemide (LASIX) 40 MG tablet take 1 tablet by mouth once daily 15 tablet 0  . glipiZIDE (GLUCOTROL XL) 10 MG 24 hr tablet Take 10 mg by mouth daily.    . insulin aspart protamine- aspart (NOVOLOG 70/30) (70-30) 100 UNIT/ML injection Inject 48-58 Units into the skin 2 (two) times daily with a meal. 58 units in AM and 48 units in PM    . metFORMIN (GLUCOPHAGE) 1000 MG tablet Take 1,000 mg by mouth 2 (two) times daily with a meal.    . omeprazole (PRILOSEC) 20 MG  capsule Take 20 mg by mouth daily.    . potassium chloride SA (K-DUR,KLOR-CON) 20 MEQ tablet Take 20 mEq by mouth daily.    Marland Kitchen triamterene-hydrochlorothiazide (DYAZIDE) 37.5-25 MG per capsule Take 1 capsule by mouth every morning.    . venlafaxine XR (EFFEXOR-XR) 150 MG 24 hr capsule Take 150 mg by mouth daily.     No current facility-administered medications for this visit.      Past Surgical History:  Procedure Laterality Date  . ENDARTERECTOMY Right 12/08/2012   Procedure: ENDARTERECTOMY CAROTID;  Surgeon: [name], MD;  Location: [facility];  Service: Vascular;  Laterality: Right;     Allergies  Allergen Reactions  . Codeine Nausea And Vomiting  . Penicillins Rash      Family History  Problem Relation Age of Onset  . Cancer Mother     Stomach  . Deep vein thrombosis Mother   . Diabetes Mother   . Hypertension Mother   . Heart disease Mother   . Hypertension Father   . Diabetes Sister   . Hyperlipidemia Sister   . Hypertension Sister   . Heart disease Sister   . Diabetes Brother   . Hyperlipidemia Brother   . Hypertension Brother      Social History Ms. Mcnichol reports that she has never smoked. She  has never used smokeless tobacco. Ms. Luff reports that she does not drink alcohol.   Review of Systems CONSTITUTIONAL: +fatigue HEENT: Eyes: No visual loss, blurred vision, double vision or yellow sclerae.No hearing loss, sneezing, congestion, runny nose or sore throat.  SKIN: No rash or itching.  CARDIOVASCULAR: per HPI RESPIRATORY: No shortness of breath, cough or sputum.  GASTROINTESTINAL: No anorexia, nausea, vomiting or diarrhea. No abdominal pain or blood.  GENITOURINARY: No burning on urination, no polyuria NEUROLOGICAL: +dizziness MUSCULOSKELETAL: No muscle, back pain, joint pain or stiffness.  LYMPHATICS: No enlarged nodes. No history of splenectomy.  PSYCHIATRIC: No history of depression or anxiety.  ENDOCRINOLOGIC: No reports of sweating, cold or  heat intolerance. No polyuria or polydipsia.  Marland Kitchen   Physical Examination Vitals:   02/04/16 1624  BP: (!) 149/76  Pulse: (!) 51   Vitals:   02/04/16 1624  Weight: 238 lb 6.4 oz (108.1 kg)  Height: 5\' 8"  (1.727 m)    Gen: resting comfortably, no acute distress HEENT: no scleral icterus, pupils equal round and reactive, no palptable cervical adenopathy,  CV: RRR, no m/r/g, no jvd Resp: Clear to auscultation bilaterally GI: abdomen is soft, non-tender, non-distended, normal bowel sounds, no hepatosplenomegaly MSK: extremities are warm, no edema.  Skin: warm, no rash Neuro:  no focal deficits Psych: appropriate affect     Assessment and Plan   1. Bradycardia - EKG in clinic shows junctional rhythm, no discernible SA node activity - significant dizziness, fatigue likely related to her bradycardia - she is not on any av nodal agents, normal TSH 01/2016 by pcp - we will refer to EP, likely will need pacemaker consideration. With stable junctional rhythm and blood pressures, we will plan for outpatient evaluation at this time, asked if significant progression in symptoms to contact us or present to ER       Arnoldo Lenis, M.D

## 2016-02-17 ENCOUNTER — Encounter: Payer: Self-pay | Admitting: Internal Medicine

## 2016-03-01 ENCOUNTER — Ambulatory Visit (INDEPENDENT_AMBULATORY_CARE_PROVIDER_SITE_OTHER): Payer: Medicare HMO | Admitting: Internal Medicine

## 2016-03-01 ENCOUNTER — Encounter (INDEPENDENT_AMBULATORY_CARE_PROVIDER_SITE_OTHER): Payer: Self-pay

## 2016-03-01 ENCOUNTER — Encounter: Payer: Self-pay | Admitting: Internal Medicine

## 2016-03-01 VITALS — BP 156/64 | HR 57 | Ht 68.0 in | Wt 237.2 lb

## 2016-03-01 DIAGNOSIS — I495 Sick sinus syndrome: Secondary | ICD-10-CM

## 2016-03-01 DIAGNOSIS — R001 Bradycardia, unspecified: Secondary | ICD-10-CM

## 2016-03-01 DIAGNOSIS — I119 Hypertensive heart disease without heart failure: Secondary | ICD-10-CM

## 2016-03-01 DIAGNOSIS — I5032 Chronic diastolic (congestive) heart failure: Secondary | ICD-10-CM

## 2016-03-01 LAB — BASIC METABOLIC PANEL
BUN: 19 mg/dL (ref 7–25)
CHLORIDE: 104 mmol/L (ref 98–110)
CO2: 30 mmol/L (ref 20–31)
CREATININE: 1 mg/dL — AB (ref 0.60–0.93)
Calcium: 9 mg/dL (ref 8.6–10.4)
Glucose, Bld: 144 mg/dL — ABNORMAL HIGH (ref 65–99)
POTASSIUM: 4.3 mmol/L (ref 3.5–5.3)
Sodium: 141 mmol/L (ref 135–146)

## 2016-03-01 LAB — CBC WITH DIFFERENTIAL/PLATELET
BASOS PCT: 0 %
Basophils Absolute: 0 cells/uL (ref 0–200)
EOS ABS: 384 {cells}/uL (ref 15–500)
EOS PCT: 6 %
HCT: 39.4 % (ref 35.0–45.0)
Hemoglobin: 12.5 g/dL (ref 11.7–15.5)
LYMPHS PCT: 29 %
Lymphs Abs: 1856 cells/uL (ref 850–3900)
MCH: 28.4 pg (ref 27.0–33.0)
MCHC: 31.7 g/dL — ABNORMAL LOW (ref 32.0–36.0)
MCV: 89.5 fL (ref 80.0–100.0)
MONOS PCT: 10 %
MPV: 11.4 fL (ref 7.5–12.5)
Monocytes Absolute: 640 cells/uL (ref 200–950)
Neutro Abs: 3520 cells/uL (ref 1500–7800)
Neutrophils Relative %: 55 %
PLATELETS: 274 10*3/uL (ref 140–400)
RBC: 4.4 MIL/uL (ref 3.80–5.10)
RDW: 14.1 % (ref 11.0–15.0)
WBC: 6.4 10*3/uL (ref 3.8–10.8)

## 2016-03-01 MED ORDER — TRIAMTERENE-HCTZ 37.5-25 MG PO CAPS: ORAL_CAPSULE | ORAL | Status: DC

## 2016-03-01 NOTE — Patient Instructions (Signed)
Medication Instructions:  Your physician has recommended you make the following change in your medication:  1) Decrease Dyazide to 1/2 tablet daily   Labwork:  Your physician recommends that you return for lab work today: BMP/CBC       Testing/Procedures:  Your physician has requested that you have an echocardiogram. Echocardiography is a painless test that uses sound waves to create images of your heart. It provides your doctor with information about the size and shape of your heart and how well your heart's chambers and valves are working. This procedure takes approximately one hour. There are no restrictions for this procedure.---will get at the hospital tomorrow   Your physician has recommended that you have a pacemaker inserted. A pacemaker is a small device that is placed under the skin of your chest or abdomen to help control abnormal heart rhythms. This device uses electrical pulses to prompt the heart to beat at a normal rate. Pacemakers are used to treat heart rhythms that are too slow. Wire (leads) are attached to the pacemaker that goes into the chambers of you heart. This is done in the hospital and usually requires and overnight stay. Please see the instruction sheet given to you today for more information.  Please arrive at [facility] 12:00noon Okay to take medications with breakfast tomorrow morning  Do not eat or drink after 9:00am Use scrub as directed   Follow-Up: Your physician recommends that you schedule a follow-up appointment in: 10-14 days in device clinic and 3 months with Dr Rayann Heman   Any Other Special Instructions Will Be Listed Below (If Applicable).     If you need a refill on your cardiac medications before your next appointment, please call your pharmacy.

## 2016-03-01 NOTE — Progress Notes (Signed)
Electrophysiology Office Note   Date:  03/01/2016   ID:  Miranda Sexton, Miranda Sexton [birth year], MRN [id number]  PCP:  Glenda Chroman, MD  Cardiologist:  Domenic Polite Primary Electrophysiologist: [name], MD    Chief Complaint  Patient presents with  . Bradycardia     History of Present Illness: Miranda Sexton is a 75 y.o. female who presents today for electrophysiology evaluation.   The patient is referred for evaluation of bradycardia.  She has chronic shortness of breath.  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  She has frequent sinus bradycardia.  She finds that when her heart rates are slower that she is more short of breath.  She has denies exertional chest pain but has occasional atypical L sided chest pain and left arm pain.  Heart cath (per Dr Domenic Polite 2014 did not show obstructive CAD).  EF is preserved.  She has prolonged first degree AV block.   She also has dizziness.  This is most noticeable when walking around.  She has postural dizziness.  She is on dyazide and lasix.  She carries a h/o RAS though this does not appear top have been recently addressed.  + chronic edema  Today, she denies symptoms of palpitations, orthopnea, PND,presyncope, syncope, bleeding, or neurologic sequela. The patient is tolerating medications without difficulties and is otherwise without complaint today.    Past Medical History:  Diagnosis Date  . Asthma   . Carotid artery occlusion   . Chronic diastolic heart failure ([place])   . CKD (chronic kidney disease) stage 2, GFR 60-89 ml/min   . Coronary atherosclerosis of native coronary artery    Nonobstructive 08/2008  . DJD (degenerative joint disease)   . Essential hypertension, benign   . GERD (gastroesophageal reflux disease)   . HLD (hyperlipidemia)   . Obesity   . PAD (peripheral artery disease) (H[facility])    Moderate right renal artery stenosis 08/2008  . Sinus bradycardia    First degree atrioventricular block  . Type 2  diabetes mellitus ([place])    Past Surgical History:  Procedure Laterality Date  . ENDARTERECTOMY Right 12/08/2012   Procedure: ENDARTERECTOMY CAROTID;  Surgeon: [name], MD;  Location: [facility] OR;  Service: Vascular;  Laterality: Right;     Current Outpatient Prescriptions  Medication Sig Dispense Refill  . amLODipine (NORVASC) 5 MG tablet Take 1 tablet (5 mg total) by mouth daily. 30 tablet 6  . aspirin EC 81 MG tablet Take 1 tablet (81 mg total) by mouth daily.    Marland Kitchen atorvastatin (LIPITOR) 40 MG tablet Take 1 tablet (40 mg total) by mouth daily. 30 tablet 1  . benazepril (LOTENSIN) 40 MG tablet Take 40 mg by mouth daily.    . diphenoxylate-atropine (LOMOTIL) 2.5-0.025 MG tablet Take by mouth as needed for diarrhea or loose stools.    . furosemide (LASIX) 40 MG tablet take 1 tablet by mouth once daily 15 tablet 0  . glipiZIDE (GLUCOTROL XL) 10 MG 24 hr tablet Take 10 mg by mouth daily.    . insulin aspart protamine- aspart (NOVOLOG 70/30) (70-30) 100 UNIT/ML injection Inject 48-58 Units into the skin 2 (two) times daily with a meal. 58 units in AM and 48 units in PM    . metFORMIN (GLUCOPHAGE) 500 MG tablet Take 1,000 mg by mouth 2 (two) times daily.    Marland Kitchen olopatadine (PATANOL) 0.1 % ophthalmic solution Place 1 drop into both eyes daily.    Marland Kitchen omeprazole (  PRILOSEC) 20 MG capsule Take 20 mg by mouth daily.    . potassium chloride SA (K-DUR,KLOR-CON) 20 MEQ tablet Take 20 mEq by mouth daily.    Marland Kitchen triamterene-hydrochlorothiazide (DYAZIDE) 37.5-25 MG per capsule Take 1 capsule by mouth every morning.    . venlafaxine XR (EFFEXOR-XR) 150 MG 24 hr capsule Take 150 mg by mouth daily.     No current facility-administered medications for this visit.     Allergies:   Codeine and Penicillins   Social History:  The patient  reports that she has never smoked. She has never used smokeless tobacco. She reports that she does not drink alcohol or use drugs.   Family History:  The patient's  family history  includes Cancer in her mother; Deep vein thrombosis in her mother; Diabetes in her brother, mother, and sister; Heart disease in her mother and sister; Hyperlipidemia in her brother and sister; Hypertension in her brother, father, mother, and sister.    ROS:  Please see the history of present illness.   All other systems are reviewed and negative.    PHYSICAL EXAM: VS:  BP (!) 156/64   Pulse (!) 57   Ht 5\' 8"  (1.727 m)   Wt 237 lb 3.2 oz (107.6 kg)   BMI 36.07 kg/m  , BMI Body mass index is 36.07 kg/m. GEN: Well nourished, well developed, in no acute distress  HEENT: normal  Neck: no JVD, carotid bruits, or masses Cardiac: RRR; no murmurs, rubs, or gallops,no edema  Respiratory:  clear to auscultation bilaterally, normal work of breathing GI: soft, nontender, nondistended, + BS MS: no deformity or atrophy  Skin: warm and dry  Neuro:  Strength and sensation are intact Psych: euthymic mood, full affect  EKG:  EKG is ordered today. The ekg ordered today shows sinus bradycadia 57 bpm, PR 218 msec, incomplete RBBB    Lipid Panel     Component Value Date/Time   CHOL (H) 08/28/2008 0520    206        ATP III CLASSIFICATION:  <200     mg/dL   Desirable  200-239  mg/dL   Borderline High  >=240    mg/dL   High          TRIG 195 (H) 08/28/2008 0520   HDL 32 (L) 08/28/2008 0520   CHOLHDL 6.4 08/28/2008 0520   VLDL 39 08/28/2008 0520   LDLCALC (H) 08/28/2008 0520    135        Total Cholesterol/HDL:CHD Risk Coronary Heart Disease Risk Table                     Men   Women  1/2 Average Risk   3.4   3.3  Average Risk       5.0   4.4  2 X Average Risk   9.6   7.1  3 X Average Risk  23.4   11.0        Use the calculated Patient Ratio above and the CHD Risk Table to determine the patient's CHD Risk.        ATP III CLASSIFICATION (LDL):  <100     mg/dL   Optimal  100-129  mg/dL   Near or Above                    Optimal  130-159  mg/dL   Borderline  160-189  mg/dL   High   >190     mg/dL  Very High     Wt Readings from Last 3 Encounters:  03/01/16 237 lb 3.2 oz (107.6 kg)  02/04/16 238 lb 6.4 oz (108.1 kg)  03/07/15 231 lb (104.8 kg)      Other studies Reviewed: Additional studies/ records that were reviewed today include: Dr Domenic Polite and Dr Nelly Laurence notes, prior echo  Review of the above records today demonstrates: preserved EF in 2015, no recent echo   ASSESSMENT AND PLAN:  1.  Sick sinus syndrome/ marked first degree AV block The patient has exertional SOB which she correlates to bradycardia. The patient has symptomatic bradycardia.  I would therefore recommend pacemaker implantation at this time.  Risks, benefits, alternatives to pacemaker implantation were discussed in detail with the patient today. The patient understands that the risks include but are not limited to bleeding, infection, pneumothorax, perforation, tamponade, vascular damage, renal failure, MI, stroke, death,  and lead dislodgement and wishes to proceed.  Given markedly prolonged first degree AV block, I think that she may AV pace.  I will therefore consider His bundle pacing at time of device implant (Medtronic).  We will therefore schedule the procedure at the next available time.  2. Postural dizziness Likely due to diuretics (on dyazide + lasix) Will wean off of dyazide Encourage hydration  3.  Hypertensive cardiovascular disease On multiple antihypertensive medicinces ? RAS Would consider referral to Dr Gwenlyn Found for further evaluation (will defer to Dr Domenic Polite)  4. Diastolic dysfunction Avoid salt Weight reduction Reduce diuretics as above  Current medicines are reviewed at length with the patient today.   The patient does not have concerns regarding her medicines.  The following changes were made today:  none  Signed, [name], MD  03/01/2016 11:08 AM     [facility] [address] [place] [address] [phone]  (office) [phone] (fax)

## 2016-03-02 ENCOUNTER — Encounter (HOSPITAL_COMMUNITY)
Admission: RE | Disposition: A | Discharge status: Home / Self Care | Payer: Self-pay | Source: Ambulatory Visit | Attending: Internal Medicine

## 2016-03-02 ENCOUNTER — Encounter (HOSPITAL_COMMUNITY): Payer: Self-pay | Admitting: *Deleted

## 2016-03-02 ENCOUNTER — Ambulatory Visit (HOSPITAL_BASED_OUTPATIENT_CLINIC_OR_DEPARTMENT_OTHER): Payer: Medicare HMO

## 2016-03-02 ENCOUNTER — Ambulatory Visit (HOSPITAL_COMMUNITY)
Admission: RE | Admit: 2016-03-02 | Discharge: 2016-03-03 | Disposition: A | Discharge status: Home / Self Care | Payer: Medicare HMO | Source: Ambulatory Visit | Attending: Internal Medicine | Admitting: Internal Medicine

## 2016-03-02 DIAGNOSIS — R9431 Abnormal electrocardiogram [ECG] [EKG]: Secondary | ICD-10-CM

## 2016-03-02 DIAGNOSIS — N182 Chronic kidney disease, stage 2 (mild): Secondary | ICD-10-CM | POA: Insufficient documentation

## 2016-03-02 DIAGNOSIS — Z833 Family history of diabetes mellitus: Secondary | ICD-10-CM | POA: Diagnosis not present

## 2016-03-02 DIAGNOSIS — Z950 Presence of cardiac pacemaker: Secondary | ICD-10-CM

## 2016-03-02 DIAGNOSIS — I130 Hypertensive heart and chronic kidney disease with heart failure and stage 1 through stage 4 chronic kidney disease, or unspecified chronic kidney disease: Secondary | ICD-10-CM | POA: Diagnosis not present

## 2016-03-02 DIAGNOSIS — I739 Peripheral vascular disease, unspecified: Secondary | ICD-10-CM | POA: Diagnosis not present

## 2016-03-02 DIAGNOSIS — I495 Sick sinus syndrome: Secondary | ICD-10-CM | POA: Diagnosis present

## 2016-03-02 DIAGNOSIS — I5032 Chronic diastolic (congestive) heart failure: Secondary | ICD-10-CM | POA: Insufficient documentation

## 2016-03-02 DIAGNOSIS — Z8249 Family history of ischemic heart disease and other diseases of the circulatory system: Secondary | ICD-10-CM | POA: Diagnosis not present

## 2016-03-02 DIAGNOSIS — Z7984 Long term (current) use of oral hypoglycemic drugs: Secondary | ICD-10-CM | POA: Insufficient documentation

## 2016-03-02 DIAGNOSIS — Z959 Presence of cardiac and vascular implant and graft, unspecified: Secondary | ICD-10-CM

## 2016-03-02 DIAGNOSIS — E1122 Type 2 diabetes mellitus with diabetic chronic kidney disease: Secondary | ICD-10-CM | POA: Insufficient documentation

## 2016-03-02 DIAGNOSIS — Z7982 Long term (current) use of aspirin: Secondary | ICD-10-CM | POA: Insufficient documentation

## 2016-03-02 DIAGNOSIS — Z79899 Other long term (current) drug therapy: Secondary | ICD-10-CM | POA: Diagnosis not present

## 2016-03-02 DIAGNOSIS — E785 Hyperlipidemia, unspecified: Secondary | ICD-10-CM | POA: Insufficient documentation

## 2016-03-02 DIAGNOSIS — E669 Obesity, unspecified: Secondary | ICD-10-CM | POA: Insufficient documentation

## 2016-03-02 DIAGNOSIS — I2510 Atherosclerotic heart disease of native coronary artery without angina pectoris: Secondary | ICD-10-CM | POA: Diagnosis not present

## 2016-03-02 DIAGNOSIS — Z880 Allergy status to penicillin: Secondary | ICD-10-CM | POA: Insufficient documentation

## 2016-03-02 HISTORY — DX: Presence of cardiac pacemaker: Z95.0

## 2016-03-02 HISTORY — PX: INSERT / REPLACE / REMOVE PACEMAKER: SUR710

## 2016-03-02 HISTORY — DX: Headache: R51

## 2016-03-02 HISTORY — DX: Dependence on supplemental oxygen: Z99.81

## 2016-03-02 HISTORY — DX: Chronic obstructive pulmonary disease, unspecified: J44.9

## 2016-03-02 HISTORY — PX: EP IMPLANTABLE DEVICE: SHX172B

## 2016-03-02 HISTORY — DX: Unspecified chronic bronchitis: J42

## 2016-03-02 HISTORY — DX: Headache, unspecified: R51.9

## 2016-03-02 LAB — ECHOCARDIOGRAM COMPLETE
Height: 68 in
Weight: 3776 [oz_av]

## 2016-03-02 LAB — MRSA PCR SCREENING: MRSA BY PCR: NEGATIVE

## 2016-03-02 LAB — GLUCOSE, CAPILLARY
GLUCOSE-CAPILLARY: 150 mg/dL — AB (ref 65–99)
GLUCOSE-CAPILLARY: 262 mg/dL — AB (ref 65–99)

## 2016-03-02 SURGERY — PACEMAKER IMPLANT

## 2016-03-02 MED ORDER — SODIUM CHLORIDE 0.9 % IV SOLN
INTRAVENOUS | Status: DC
  Administered 2016-03-02: 13:00:00 via INTRAVENOUS

## 2016-03-02 MED ORDER — MIDAZOLAM HCL 5 MG/5ML IJ SOLN
INTRAMUSCULAR | Status: DC | PRN
  Administered 2016-03-02 (×3): 1 mg via INTRAVENOUS

## 2016-03-02 MED ORDER — INSULIN ASPART PROT & ASPART (70-30 MIX) 100 UNIT/ML ~~LOC~~ SUSP
48.0000 [IU] | Freq: Two times a day (BID) | SUBCUTANEOUS | Status: DC
Start: 2016-03-03 — End: 2016-03-03
  Administered 2016-03-03: 58 [IU] via SUBCUTANEOUS
  Filled 2016-03-02: qty 10

## 2016-03-02 MED ORDER — AMLODIPINE BESYLATE 5 MG PO TABS
5.0000 mg | ORAL_TABLET | Freq: Every day | ORAL | Status: DC
  Administered 2016-03-03: 5 mg via ORAL
  Filled 2016-03-02: qty 1

## 2016-03-02 MED ORDER — SODIUM CHLORIDE 0.9% FLUSH
3.0000 mL | Freq: Two times a day (BID) | INTRAVENOUS | Status: DC
  Administered 2016-03-03: 3 mL via INTRAVENOUS

## 2016-03-02 MED ORDER — SODIUM CHLORIDE 0.9 % IR SOLN
80.0000 mg | Status: AC
  Administered 2016-03-02: 80 mg

## 2016-03-02 MED ORDER — MUPIROCIN 2 % EX OINT
TOPICAL_OINTMENT | CUTANEOUS | Status: AC
  Administered 2016-03-02: 13:00:00 via NASAL
  Filled 2016-03-02: qty 22

## 2016-03-02 MED ORDER — IOPAMIDOL (ISOVUE-370) INJECTION 76%
INTRAVENOUS | Status: DC | PRN
  Administered 2016-03-02: 15 mL via INTRAVENOUS

## 2016-03-02 MED ORDER — LIDOCAINE HCL (PF) 1 % IJ SOLN
INTRAMUSCULAR | Status: DC | PRN
  Administered 2016-03-02: 45 mL

## 2016-03-02 MED ORDER — PERFLUTREN LIPID MICROSPHERE
1.0000 mL | INTRAVENOUS | Status: DC | PRN
  Administered 2016-03-02: 1.5 mL via INTRAVENOUS
  Filled 2016-03-02: qty 10

## 2016-03-02 MED ORDER — VANCOMYCIN HCL 10 G IV SOLR
1500.0000 mg | INTRAVENOUS | Status: AC
  Administered 2016-03-02: 1500 mg via INTRAVENOUS
  Filled 2016-03-02: qty 1500

## 2016-03-02 MED ORDER — ACETAMINOPHEN 325 MG PO TABS
325.0000 mg | ORAL_TABLET | ORAL | Status: DC | PRN
  Administered 2016-03-02 – 2016-03-03 (×2): 650 mg via ORAL
  Filled 2016-03-02 (×2): qty 2

## 2016-03-02 MED ORDER — OLOPATADINE HCL 0.1 % OP SOLN
1.0000 [drp] | Freq: Every day | OPHTHALMIC | Status: DC
  Administered 2016-03-03: 1 [drp] via OPHTHALMIC
  Filled 2016-03-02: qty 5

## 2016-03-02 MED ORDER — HEPARIN (PORCINE) IN NACL 2-0.9 UNIT/ML-% IJ SOLN
INTRAMUSCULAR | Status: DC | PRN
  Administered 2016-03-02: 500 mL

## 2016-03-02 MED ORDER — FENTANYL CITRATE (PF) 100 MCG/2ML IJ SOLN
INTRAMUSCULAR | Status: DC | PRN
  Administered 2016-03-02 (×2): 12.5 ug via INTRAVENOUS

## 2016-03-02 MED ORDER — SODIUM CHLORIDE 0.9 % IR SOLN
Status: AC
  Filled 2016-03-02: qty 2

## 2016-03-02 MED ORDER — HYDROCODONE-ACETAMINOPHEN 5-325 MG PO TABS: 1.0000 | ORAL_TABLET | ORAL | Status: DC | PRN

## 2016-03-02 MED ORDER — ONDANSETRON HCL 4 MG/2ML IJ SOLN: 4.0000 mg | Freq: Four times a day (QID) | INTRAMUSCULAR | Status: DC | PRN

## 2016-03-02 MED ORDER — BENAZEPRIL HCL 40 MG PO TABS
40.0000 mg | ORAL_TABLET | Freq: Every day | ORAL | Status: DC
  Administered 2016-03-03: 40 mg via ORAL
  Filled 2016-03-02: qty 1

## 2016-03-02 MED ORDER — SODIUM CHLORIDE 0.9% FLUSH: 3.0000 mL | INTRAVENOUS | Status: DC | PRN

## 2016-03-02 MED ORDER — FENTANYL CITRATE (PF) 100 MCG/2ML IJ SOLN
INTRAMUSCULAR | Status: AC
Start: 2016-03-02 — End: 2016-03-02
  Filled 2016-03-02: qty 2

## 2016-03-02 MED ORDER — VANCOMYCIN HCL IN DEXTROSE 1-5 GM/200ML-% IV SOLN
1000.0000 mg | Freq: Two times a day (BID) | INTRAVENOUS | Status: AC
  Administered 2016-03-03: 1000 mg via INTRAVENOUS
  Filled 2016-03-02: qty 200

## 2016-03-02 MED ORDER — SODIUM CHLORIDE 0.9 % IV SOLN: 250.0000 mL | INTRAVENOUS | Status: DC | PRN

## 2016-03-02 MED ORDER — VENLAFAXINE HCL ER 150 MG PO CP24
150.0000 mg | ORAL_CAPSULE | Freq: Every day | ORAL | Status: DC
  Administered 2016-03-03: 150 mg via ORAL
  Filled 2016-03-02: qty 1

## 2016-03-02 MED ORDER — PERFLUTREN LIPID MICROSPHERE
INTRAVENOUS | Status: AC
  Filled 2016-03-02: qty 10

## 2016-03-02 MED ORDER — HEPARIN (PORCINE) IN NACL 2-0.9 UNIT/ML-% IJ SOLN
INTRAMUSCULAR | Status: AC
  Filled 2016-03-02: qty 500

## 2016-03-02 MED ORDER — CHLORHEXIDINE GLUCONATE 4 % EX LIQD
60.0000 mL | Freq: Once | CUTANEOUS | Status: DC
  Filled 2016-03-02: qty 60

## 2016-03-02 MED ORDER — MIDAZOLAM HCL 5 MG/5ML IJ SOLN
INTRAMUSCULAR | Status: AC
  Filled 2016-03-02: qty 5

## 2016-03-02 MED ORDER — GLIPIZIDE ER 10 MG PO TB24
10.0000 mg | ORAL_TABLET | Freq: Every day | ORAL | Status: DC
  Administered 2016-03-03: 10 mg via ORAL
  Filled 2016-03-02: qty 1

## 2016-03-02 MED ORDER — LIDOCAINE HCL (PF) 1 % IJ SOLN
INTRAMUSCULAR | Status: AC
  Filled 2016-03-02: qty 60

## 2016-03-02 SURGICAL SUPPLY — 11 items
CABLE SURGICAL S-101-97-12 (CABLE) ×3 IMPLANT
CATH RIGHTSITE C315HIS02 (CATHETERS) ×3 IMPLANT
LEAD CAPSURE NOVUS 5076-52CM (Lead) ×3 IMPLANT
LEAD SELECT SECURE 3830 383069 (Lead) ×1 IMPLANT
PAD DEFIB LIFELINK (PAD) ×3 IMPLANT
PPM ADVISA MRI DR A2DR01 (Pacemaker) ×3 IMPLANT
SELECT SECURE 3830 383069 (Lead) ×3 IMPLANT
SHEATH CLASSIC 7F (SHEATH) ×6 IMPLANT
SLITTER 6232ADJ (MISCELLANEOUS) ×3 IMPLANT
TRAY PACEMAKER INSERTION (PACKS) ×3 IMPLANT
WIRE GUIDERIGHT .032X180 (WIRE) ×3 IMPLANT

## 2016-03-02 NOTE — H&P (View-Only) (Signed)
Electrophysiology Office Note   Date:  03/01/2016   ID:  Miranda, Sexton 1940-08-30, MRN [id number]  PCP:  Glenda Chroman, MD  Cardiologist:  Domenic Polite Primary Electrophysiologist: [name], MD    Chief Complaint  Patient presents with  . Bradycardia     History of Present Illness: Miranda Sexton is a 75 y.o. female who presents today for electrophysiology evaluation.   The patient is referred for evaluation of bradycardia.  She has chronic shortness of breath.  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  She has frequent sinus bradycardia.  She finds that when her heart rates are slower that she is more short of breath.  She has denies exertional chest pain but has occasional atypical L sided chest pain and left arm pain.  Heart cath (per Dr Domenic Polite 2014 did not show obstructive CAD).  EF is preserved.  She has prolonged first degree AV block.   She also has dizziness.  This is most noticeable when walking around.  She has postural dizziness.  She is on dyazide and lasix.  She carries a h/o RAS though this does not appear top have been recently addressed.  + chronic edema  Today, she denies symptoms of palpitations, orthopnea, PND,presyncope, syncope, bleeding, or neurologic sequela. The patient is tolerating medications without difficulties and is otherwise without complaint today.    Past Medical History:  Diagnosis Date  . Asthma   . Carotid artery occlusion   . Chronic diastolic heart failure ([place])   . CKD (chronic kidney disease) stage 2, GFR 60-89 ml/min   . Coronary atherosclerosis of native coronary artery    Nonobstructive 08/2008  . DJD (degenerative joint disease)   . Essential hypertension, benign   . GERD (gastroesophageal reflux disease)   . HLD (hyperlipidemia)   . Obesity   . PAD (peripheral artery disease) (HCC)    Moderate right renal artery stenosis 08/2008  . Sinus bradycardia    First degree atrioventricular block  . Type 2  diabetes mellitus ([place])    Past Surgical History:  Procedure Laterality Date  . ENDARTERECTOMY Right 12/08/2012   Procedure: ENDARTERECTOMY CAROTID;  Surgeon: [name], MD;  Location: [facility] OR;  Service: Vascular;  Laterality: Right;     Current Outpatient Prescriptions  Medication Sig Dispense Refill  . amLODipine (NORVASC) 5 MG tablet Take 1 tablet (5 mg total) by mouth daily. 30 tablet 6  . aspirin EC 81 MG tablet Take 1 tablet (81 mg total) by mouth daily.    Marland Kitchen atorvastatin (LIPITOR) 40 MG tablet Take 1 tablet (40 mg total) by mouth daily. 30 tablet 1  . benazepril (LOTENSIN) 40 MG tablet Take 40 mg by mouth daily.    . diphenoxylate-atropine (LOMOTIL) 2.5-0.025 MG tablet Take by mouth as needed for diarrhea or loose stools.    . furosemide (LASIX) 40 MG tablet take 1 tablet by mouth once daily 15 tablet 0  . glipiZIDE (GLUCOTROL XL) 10 MG 24 hr tablet Take 10 mg by mouth daily.    . insulin aspart protamine- aspart (NOVOLOG 70/30) (70-30) 100 UNIT/ML injection Inject 48-58 Units into the skin 2 (two) times daily with a meal. 58 units in AM and 48 units in PM    . metFORMIN (GLUCOPHAGE) 500 MG tablet Take 1,000 mg by mouth 2 (two) times daily.    Marland Kitchen olopatadine (PATANOL) 0.1 % ophthalmic solution Place 1 drop into both eyes daily.    Marland Kitchen omeprazole (  PRILOSEC) 20 MG capsule Take 20 mg by mouth daily.    . potassium chloride SA (K-DUR,KLOR-CON) 20 MEQ tablet Take 20 mEq by mouth daily.    Marland Kitchen triamterene-hydrochlorothiazide (DYAZIDE) 37.5-25 MG per capsule Take 1 capsule by mouth every morning.    . venlafaxine XR (EFFEXOR-XR) 150 MG 24 hr capsule Take 150 mg by mouth daily.     No current facility-administered medications for this visit.     Allergies:   Codeine and Penicillins   Social History:  The patient  reports that she has never smoked. She has never used smokeless tobacco. She reports that she does not drink alcohol or use drugs.   Family History:  The patient's  family history  includes Cancer in her mother; Deep vein thrombosis in her mother; Diabetes in her brother, mother, and sister; Heart disease in her mother and sister; Hyperlipidemia in her brother and sister; Hypertension in her brother, father, mother, and sister.    ROS:  Please see the history of present illness.   All other systems are reviewed and negative.    PHYSICAL EXAM: VS:  BP (!) 156/64   Pulse (!) 57   Ht 5\' 8"  (1.727 m)   Wt 237 lb 3.2 oz (107.6 kg)   BMI 36.07 kg/m  , BMI Body mass index is 36.07 kg/m. GEN: Well nourished, well developed, in no acute distress  HEENT: normal  Neck: no JVD, carotid bruits, or masses Cardiac: RRR; no murmurs, rubs, or gallops,no edema  Respiratory:  clear to auscultation bilaterally, normal work of breathing GI: soft, nontender, nondistended, + BS MS: no deformity or atrophy  Skin: warm and dry  Neuro:  Strength and sensation are intact Psych: euthymic mood, full affect  EKG:  EKG is ordered today. The ekg ordered today shows sinus bradycadia 57 bpm, PR 218 msec, incomplete RBBB    Lipid Panel     Component Value Date/Time   CHOL (H) 08/28/2008 0520    206        ATP III CLASSIFICATION:  <200     mg/dL   Desirable  200-239  mg/dL   Borderline High  >=240    mg/dL   High          TRIG 195 (H) 08/28/2008 0520   HDL 32 (L) 08/28/2008 0520   CHOLHDL 6.4 08/28/2008 0520   VLDL 39 08/28/2008 0520   LDLCALC (H) 08/28/2008 0520    135        Total Cholesterol/HDL:CHD Risk Coronary Heart Disease Risk Table                     Men   Women  1/2 Average Risk   3.4   3.3  Average Risk       5.0   4.4  2 X Average Risk   9.6   7.1  3 X Average Risk  23.4   11.0        Use the calculated Patient Ratio above and the CHD Risk Table to determine the patient's CHD Risk.        ATP III CLASSIFICATION (LDL):  <100     mg/dL   Optimal  100-129  mg/dL   Near or Above                    Optimal  130-159  mg/dL   Borderline  160-189  mg/dL   High   >190     mg/dL  Very High     Wt Readings from Last 3 Encounters:  03/01/16 237 lb 3.2 oz (107.6 kg)  02/04/16 238 lb 6.4 oz (108.1 kg)  03/07/15 231 lb (104.8 kg)      Other studies Reviewed: Additional studies/ records that were reviewed today include: Dr Domenic Polite and Dr Nelly Laurence notes, prior echo  Review of the above records today demonstrates: preserved EF in 2015, no recent echo   ASSESSMENT AND PLAN:  1.  Sick sinus syndrome/ marked first degree AV block The patient has exertional SOB which she correlates to bradycardia. The patient has symptomatic bradycardia.  I would therefore recommend pacemaker implantation at this time.  Risks, benefits, alternatives to pacemaker implantation were discussed in detail with the patient today. The patient understands that the risks include but are not limited to bleeding, infection, pneumothorax, perforation, tamponade, vascular damage, renal failure, MI, stroke, death,  and lead dislodgement and wishes to proceed.  Given markedly prolonged first degree AV block, I think that she may AV pace.  I will therefore consider His bundle pacing at time of device implant ([employer]tronic).  We will therefore schedule the procedure at the next available time.  2. Postural dizziness Likely due to diuretics (on dyazide + lasix) Will wean off of dyazide Encourage hydration  3.  Hypertensive cardiovascular disease On multiple antihypertensive medicinces ? RAS Would consider referral to Dr Gwenlyn Found for further evaluation (will defer to Dr Domenic Polite)  4. Diastolic dysfunction Avoid salt Weight reduction Reduce diuretics as above  Current medicines are reviewed at length with the patient today.   The patient does not have concerns regarding her medicines.  The following changes were made today:  none  Signed, [name], MD  03/01/2016 11:08 AM     [facility] [address] [place]  [address] [phone]  (office) [phone] (fax)

## 2016-03-02 NOTE — Progress Notes (Signed)
Pt received in cath holding post PTVP insertion for series of 12 lead EKGs.

## 2016-03-02 NOTE — Interval H&P Note (Signed)
History and Physical Interval Note:  03/02/2016 2:58 PM  Miranda Sexton  has presented today for surgery, with the diagnosis of bradicardia  The various methods of treatment have been discussed with the patient and family. After consideration of risks, benefits and other options for treatment, the patient has consented to  Procedure(s): Pacemaker Implant (N/A) as a surgical intervention .  The patient's history has been reviewed, patient examined, no change in status, stable for surgery.  I have reviewed the patient's chart and labs.  Questions were answered to the patient's satisfaction.     [name]

## 2016-03-02 NOTE — Progress Notes (Signed)
  Echocardiogram 2D Echocardiogram with definity has been performed.  Darlina Sicilian M 03/02/2016, 1:56 PM

## 2016-03-03 ENCOUNTER — Encounter (HOSPITAL_COMMUNITY): Payer: Self-pay | Admitting: Internal Medicine

## 2016-03-03 ENCOUNTER — Ambulatory Visit (HOSPITAL_COMMUNITY): Payer: Medicare HMO

## 2016-03-03 DIAGNOSIS — I495 Sick sinus syndrome: Secondary | ICD-10-CM | POA: Diagnosis not present

## 2016-03-03 DIAGNOSIS — N182 Chronic kidney disease, stage 2 (mild): Secondary | ICD-10-CM | POA: Diagnosis not present

## 2016-03-03 DIAGNOSIS — Z950 Presence of cardiac pacemaker: Secondary | ICD-10-CM

## 2016-03-03 DIAGNOSIS — Z959 Presence of cardiac and vascular implant and graft, unspecified: Secondary | ICD-10-CM

## 2016-03-03 DIAGNOSIS — I739 Peripheral vascular disease, unspecified: Secondary | ICD-10-CM | POA: Diagnosis not present

## 2016-03-03 DIAGNOSIS — E1122 Type 2 diabetes mellitus with diabetic chronic kidney disease: Secondary | ICD-10-CM | POA: Diagnosis not present

## 2016-03-03 LAB — BASIC METABOLIC PANEL
ANION GAP: 8 (ref 5–15)
BUN: 12 mg/dL (ref 6–20)
CALCIUM: 8.7 mg/dL — AB (ref 8.9–10.3)
CO2: 29 mmol/L (ref 22–32)
CREATININE: 0.85 mg/dL (ref 0.44–1.00)
Chloride: 102 mmol/L (ref 101–111)
Glucose, Bld: 220 mg/dL — ABNORMAL HIGH (ref 65–99)
Potassium: 3.9 mmol/L (ref 3.5–5.1)
SODIUM: 139 mmol/L (ref 135–145)

## 2016-03-03 LAB — GLUCOSE, CAPILLARY: GLUCOSE-CAPILLARY: 168 mg/dL — AB (ref 65–99)

## 2016-03-03 NOTE — Discharge Summary (Signed)
ELECTROPHYSIOLOGY PROCEDURE DISCHARGE SUMMARY    Patient ID: Miranda Sexton,  MRN: [id number], DOB/AGE: Aug 22, 1940 75 y.o.  Admit date: 03/02/2016 Discharge date: 03/03/2016  Primary Care Physician: Glenda Chroman, MD Primary Cardiologist: Dr. Domenic Polite Electrophysiologist: Dr. Rayann Heman  Primary Discharge Diagnosis:  1. SSSx, symptomatic bradycardia  Secondary Discharge Diagnosis:  1. PVD (carotid, RAS) 2. DM 3. HTN 4. CKD, stage II 5. Diastolic dysfunction  Allergies  Allergen Reactions  . Codeine Nausea And Vomiting  . Penicillins Rash     Procedures This Admission:  1.  Implantation of a MDT dual chamber PPM on 03/02/16 by Dr Rayann Heman.  The patient received a Medtronic Advisa DR MRI SureScan model [id number] (serial number [id number]) pacemaker, Medtronic model [id number] (serial number [id number] right atrial lead and a Medtronic model 3830-69 (serial number [id number]) right ventricular lead.  There were no immediate post procedure complications. 2.  CXR on 03/03/16 demonstrated no pneumothorax status post device implantation.        Patient is afebrile without symptoms of illness, no cough, SOB, lungs are clear  Brief HPI: Miranda Sexton is a 75 y.o. female was referred to electrophysiology in the outpatient setting for consideration of PPM implantation.  Past medical history includes sssx, HTN, PVD, DM.  The patient has had symptomatic bradycardia without reversible causes identified.  Risks, benefits, and alternatives to PPM implantation were reviewed with the patient who wished to proceed.   Hospital Course:  The patient was admitted and underwent implantation of a PPM with details as outlined above. She was monitored on telemetry overnight which demonstrated Apaced, V sensed.  Left chest was without hematoma or ecchymosis.  The device was interrogated and found to be functioning normally.  CXR was obtained and demonstrated no pneumothorax status post device implantation.   Wound care, arm mobility, and restrictions were reviewed with the patient.  The patient was examined by Dr. Rayann Heman and considered stable for discharge to home.    Physical Exam: Vitals:   03/02/16 1750 03/02/16 1824 03/02/16 2055 03/03/16 0458  BP: (!) 160/88 (!) 142/72 91/74 (!) 142/57  Pulse:   (!) 59 (!) 58  Resp: 20  20 20   Temp:   98.1 F (36.7 C) 98 F (36.7 C)  TempSrc:   Oral Oral  SpO2:   100% 97%  Weight:    236 lb 4.8 oz (107.2 kg)  Height:         GEN- The patient is well appearing, alert and oriented x 3 today.   HEENT: normocephalic, atraumatic; sclera clear, conjunctiva pink; hearing intact; oropharynx clear; neck supple, no JVP Lungs- Clear to ausculation bilaterally, normal work of breathing.  No wheezes, rales, rhonchi Heart- Regular rate and rhythm, no murmurs, rubs or gallops, PMI not laterally displaced GI- soft, non-tender, non-distended Extremities- no clubbing, cyanosis, or edema MS- no significant deformity or atrophy Skin- warm and dry, no rash or lesion, left chest without hematoma/ecchymosis Psych- euthymic mood, full affect Neuro- no gross deficits   Labs:   Lab Results  Component Value Date   WBC 6.4 03/01/2016   HGB 12.5 03/01/2016   HCT 39.4 03/01/2016   MCV 89.5 03/01/2016   PLT 274 03/01/2016     Recent Labs Lab 03/03/16 0343  NA 139  K 3.9  CL 102  CO2 29  BUN 12  CREATININE 0.85  CALCIUM 8.7*  GLUCOSE 220*    Discharge Medications:    Medication List    TAKE  these medications   amLODipine 5 MG tablet Commonly known as:  NORVASC Take 1 tablet (5 mg total) by mouth daily.   aspirin EC 81 MG tablet Take 1 tablet (81 mg total) by mouth daily.   atorvastatin 40 MG tablet Commonly known as:  LIPITOR Take 1 tablet (40 mg total) by mouth daily.   benazepril 40 MG tablet Commonly known as:  LOTENSIN Take 40 mg by mouth daily.   diphenoxylate-atropine 2.5-0.025 MG tablet Commonly known as:  LOMOTIL Take 1 tablet by  mouth as needed for diarrhea or loose stools.   furosemide 40 MG tablet Commonly known as:  LASIX take 1 tablet by mouth once daily   glipiZIDE 10 MG 24 hr tablet Commonly known as:  GLUCOTROL XL Take 10 mg by mouth daily.   insulin aspart protamine- aspart (70-30) 100 UNIT/ML injection Commonly known as:  NOVOLOG MIX 70/30 Inject 48-58 Units into the skin 2 (two) times daily with a meal. 58 units in AM and 48 units in PM   metFORMIN 500 MG tablet Commonly known as:  GLUCOPHAGE Take 1,000 mg by mouth 2 (two) times daily.   olopatadine 0.1 % ophthalmic solution Commonly known as:  PATANOL Place 1 drop into both eyes daily.   omeprazole 20 MG capsule Commonly known as:  PRILOSEC Take 20 mg by mouth daily.   potassium chloride SA 20 MEQ tablet Commonly known as:  K-DUR,KLOR-CON Take 20 mEq by mouth daily.   triamterene-hydrochlorothiazide 37.5-25 MG capsule Commonly known as:  DYAZIDE Take 1/2 tablet by mouth daily   venlafaxine XR 150 MG 24 hr capsule Commonly known as:  EFFEXOR-XR Take 150 mg by mouth daily.       Disposition:  Home Discharge Instructions    Diet - low sodium heart healthy    Complete by:  As directed   Increase activity slowly    Complete by:  As directed     Follow-up Information    [facility] Follow up on 03/15/2016.   Specialty:  Cardiology Why:  4:30PM, wound check Contact information: [address]       [name], MD Follow up on 06/04/2016.   Specialty:  Cardiology Why:  11:00AM Contact information: [place] [employer] [address] [phone]           Duration of Discharge Encounter: Greater than 30 minutes including physician time.  Signed, Tommye Standard, PA-C 03/03/2016 9:59 AM   I have seen, examined the patient, and reviewed the above assessment and plan.  On exam, RRR.  Changes to above are made where necessary.   Device interrogation is personally reviewed and normal.  CXR reveals stable leads, no ptx.  Co Sign: [name], MD 03/03/2016 4:07 PM

## 2016-03-03 NOTE — Discharge Instructions (Signed)
° ° °  Supplemental Discharge Instructions for  Pacemaker/Defibrillator Patients  Activity No heavy lifting or vigorous activity with your left/right arm for 6 to 8 weeks.  Do not raise your left/right arm above your head for one week.  Gradually raise your affected arm as drawn below.             03/07/16                       03/08/16                      03/09/16                     03/10/16 __  NO DRIVING for  1 week   ; you may begin driving on   [id number]  .  WOUND CARE - Keep the wound area clean and dry.  Do not get this area wet for one week. No showers for one week; you may shower on 03/10/16    . - The tape/steri-strips on your wound will fall off; do not pull them off.  No bandage is needed on the site.  DO  NOT apply any creams, oils, or ointments to the wound area. - If you notice any drainage or discharge from the wound, any swelling or bruising at the site, or you develop a fever > 101? F after you are discharged home, call the office at once.  Special Instructions - You are still able to use cellular telephones; use the ear opposite the side where you have your pacemaker/defibrillator.  Avoid carrying your cellular phone near your device. - When traveling through airports, show security personnel your identification card to avoid being screened in the metal detectors.  Ask the security personnel to use the hand wand. - Avoid arc welding equipment, MRI testing (magnetic resonance imaging), TENS units (transcutaneous nerve stimulators).  Call the office for questions about other devices. - Avoid electrical appliances that are in poor condition or are not properly grounded. - Microwave ovens are safe to be near or to operate.  Additional information for defibrillator patients should your device go off: - If your device goes off ONCE and you feel fine afterward, notify the device clinic nurses. - If your device goes off ONCE and you do not feel well afterward, call 911. - If your device  goes off TWICE, call 911. - If your device goes off THREE times in one day, call 911.  DO NOT DRIVE YOURSELF OR A FAMILY MEMBER WITH A DEFIBRILLATOR TO THE HOSPITAL--CALL 911.

## 2016-03-10 ENCOUNTER — Ambulatory Visit: Payer: Medicare HMO | Admitting: Family

## 2016-03-10 ENCOUNTER — Encounter (HOSPITAL_COMMUNITY): Payer: Medicare HMO

## 2016-03-11 ENCOUNTER — Ambulatory Visit: Payer: Medicare HMO | Admitting: Cardiology

## 2016-03-12 ENCOUNTER — Encounter: Payer: Self-pay | Admitting: Family

## 2016-03-15 ENCOUNTER — Ambulatory Visit (INDEPENDENT_AMBULATORY_CARE_PROVIDER_SITE_OTHER): Payer: Medicare HMO | Admitting: *Deleted

## 2016-03-15 ENCOUNTER — Encounter: Payer: Self-pay | Admitting: Internal Medicine

## 2016-03-15 DIAGNOSIS — I495 Sick sinus syndrome: Secondary | ICD-10-CM

## 2016-03-17 LAB — CUP PACEART INCLINIC DEVICE CHECK
Battery Voltage: 3.11 V
Brady Statistic AP VP Percent: 96 %
Brady Statistic AS VP Percent: 3.78 %
Brady Statistic RA Percent Paced: 96.2 %
Brady Statistic RV Percent Paced: 99.78 %
Implantable Lead Implant Date: 20170829
Implantable Lead Location: 753859
Implantable Lead Model: 3830
Lead Channel Impedance Value: 323 Ohm
Lead Channel Impedance Value: 665 Ohm
Lead Channel Pacing Threshold Pulse Width: 0.4 ms
Lead Channel Sensing Intrinsic Amplitude: 1.3 mV
Lead Channel Setting Pacing Pulse Width: 0.4 ms
MDC IDC LEAD IMPLANT DT: 20170829
MDC IDC LEAD LOCATION: 753860
MDC IDC MSMT LEADCHNL RA IMPEDANCE VALUE: 437 Ohm
MDC IDC MSMT LEADCHNL RA PACING THRESHOLD AMPLITUDE: 0.75 V
MDC IDC MSMT LEADCHNL RV IMPEDANCE VALUE: 285 Ohm
MDC IDC MSMT LEADCHNL RV PACING THRESHOLD AMPLITUDE: 1 V
MDC IDC MSMT LEADCHNL RV PACING THRESHOLD PULSEWIDTH: 0.4 ms
MDC IDC MSMT LEADCHNL RV SENSING INTR AMPL: 5.3 mV
MDC IDC SESS DTM: 20170911205416
MDC IDC SET LEADCHNL RA PACING AMPLITUDE: 3.5 V
MDC IDC SET LEADCHNL RV PACING AMPLITUDE: 3.5 V
MDC IDC SET LEADCHNL RV SENSING SENSITIVITY: 0.9 mV
MDC IDC STAT BRADY AP VS PERCENT: 0.2 %
MDC IDC STAT BRADY AS VS PERCENT: 0.02 %

## 2016-03-17 NOTE — Progress Notes (Signed)
Wound check appointment. Steri-strips removed. Wound without redness or edema. Incision edges approximated, wound well healed. Normal device function. Thresholds, sensing, and impedances consistent with implant measurements. Device programmed at 3.5V for extra safety margin until 3 month visit. Histogram distribution appropriate for patient and level of activity. No mode switches or high ventricular rates noted. Patient educated about wound care, arm mobility, lifting restrictions. ROV in 3 months with JA. 

## 2016-03-19 ENCOUNTER — Ambulatory Visit (HOSPITAL_COMMUNITY): Payer: Medicare HMO

## 2016-03-19 ENCOUNTER — Ambulatory Visit: Payer: Medicare HMO | Admitting: Family

## 2016-03-26 ENCOUNTER — Ambulatory Visit: Payer: Medicare HMO | Admitting: Cardiology

## 2016-04-13 ENCOUNTER — Encounter: Payer: Self-pay | Admitting: Family

## 2016-04-16 ENCOUNTER — Ambulatory Visit (HOSPITAL_COMMUNITY)
Admission: RE | Admit: 2016-04-16 | Discharge: 2016-04-16 | Disposition: A | Discharge status: Home / Self Care | Payer: Medicare HMO | Source: Ambulatory Visit | Attending: Family | Admitting: Family

## 2016-04-16 ENCOUNTER — Encounter: Payer: Self-pay | Admitting: Family

## 2016-04-16 ENCOUNTER — Ambulatory Visit (INDEPENDENT_AMBULATORY_CARE_PROVIDER_SITE_OTHER): Payer: Medicare HMO | Admitting: Family

## 2016-04-16 VITALS — BP 151/85 | HR 71 | Temp 97.5°F | Resp 16 | Ht 68.0 in | Wt 236.0 lb

## 2016-04-16 DIAGNOSIS — Z9889 Other specified postprocedural states: Secondary | ICD-10-CM | POA: Diagnosis not present

## 2016-04-16 DIAGNOSIS — I6522 Occlusion and stenosis of left carotid artery: Secondary | ICD-10-CM | POA: Insufficient documentation

## 2016-04-16 DIAGNOSIS — I6523 Occlusion and stenosis of bilateral carotid arteries: Secondary | ICD-10-CM

## 2016-04-16 DIAGNOSIS — Z48812 Encounter for surgical aftercare following surgery on the circulatory system: Secondary | ICD-10-CM | POA: Diagnosis not present

## 2016-04-16 DIAGNOSIS — IMO0001 Reserved for inherently not codable concepts without codable children: Secondary | ICD-10-CM

## 2016-04-16 DIAGNOSIS — R030 Elevated blood-pressure reading, without diagnosis of hypertension: Secondary | ICD-10-CM

## 2016-04-16 NOTE — Patient Instructions (Signed)
Stroke Prevention Some medical conditions and behaviors are associated with an increased chance of having a stroke. You may prevent a stroke by making healthy choices and managing medical conditions. HOW CAN I REDUCE MY RISK OF HAVING A STROKE?   Stay physically active. Get at least 30 minutes of activity on most or all days.  Do not smoke. It may also be helpful to avoid exposure to secondhand smoke.  Limit alcohol use. Moderate alcohol use is considered to be:  No more than 2 drinks per day for men.  No more than 1 drink per day for nonpregnant women.  Eat healthy foods. This involves:  Eating 5 or more servings of fruits and vegetables a day.  Making dietary changes that address high blood pressure (hypertension), high cholesterol, diabetes, or obesity.  Manage your cholesterol levels.  Making food choices that are high in fiber and low in saturated fat, trans fat, and cholesterol may control cholesterol levels.  Take any prescribed medicines to control cholesterol as directed by your health care provider.  Manage your diabetes.  Controlling your carbohydrate and sugar intake is recommended to manage diabetes.  Take any prescribed medicines to control diabetes as directed by your health care provider.  Control your hypertension.  Making food choices that are low in salt (sodium), saturated fat, trans fat, and cholesterol is recommended to manage hypertension.  Ask your health care provider if you need treatment to lower your blood pressure. Take any prescribed medicines to control hypertension as directed by your health care provider.  If you are 18-39 years of age, have your blood pressure checked every 3-5 years. If you are 40 years of age or older, have your blood pressure checked every year.  Maintain a healthy weight.  Reducing calorie intake and making food choices that are low in sodium, saturated fat, trans fat, and cholesterol are recommended to manage  weight.  Stop drug abuse.  Avoid taking birth control pills.  Talk to your health care provider about the risks of taking birth control pills if you are over 35 years old, smoke, get migraines, or have ever had a blood clot.  Get evaluated for sleep disorders (sleep apnea).  Talk to your health care provider about getting a sleep evaluation if you snore a lot or have excessive sleepiness.  Take medicines only as directed by your health care provider.  For some people, aspirin or blood thinners (anticoagulants) are helpful in reducing the risk of forming abnormal blood clots that can lead to stroke. If you have the irregular heart rhythm of atrial fibrillation, you should be on a blood thinner unless there is a good reason you cannot take them.  Understand all your medicine instructions.  Make sure that other conditions (such as anemia or atherosclerosis) are addressed. SEEK IMMEDIATE MEDICAL CARE IF:   You have sudden weakness or numbness of the face, arm, or leg, especially on one side of the body.  Your face or eyelid droops to one side.  You have sudden confusion.  You have trouble speaking (aphasia) or understanding.  You have sudden trouble seeing in one or both eyes.  You have sudden trouble walking.  You have dizziness.  You have a loss of balance or coordination.  You have a sudden, severe headache with no known cause.  You have new chest pain or an irregular heartbeat. Any of these symptoms may represent a serious problem that is an emergency. Do not wait to see if the symptoms will   go away. Get medical help at once. Call your local emergency services (911 in [place] Do not drive yourself to the hospital.   This information is not intended to replace advice given to you by your health care provider. Make sure you discuss any questions you have with your health care provider.   Document Released: 07/29/2004 Document Revised: 07/12/2014 Document Reviewed:  12/22/2012 Elsevier Interactive Patient Education 2016 Elsevier Inc.  

## 2016-04-16 NOTE — Progress Notes (Signed)
Chief Complaint: Follow up Extracranial Carotid Artery Stenosis   History of Present Illness  Miranda Sexton is a 75 y.o. female patient of Dr. Donnetta Hutching who is status post right carotid endarterectomy on 12/08/12 for severe asymptomatic disease.  She returns today for carotid artery surveillance.  Patient has no history of TIA or stroke symptoms. Specifically the patient denies any history of amaurosis fugax or monocular blindness, unilateral facial drooping, hemiparesis, or receptive or expressive aphasia.  She does not have claudication symptoms with walking, denies non healing wounds. She reports that she does a great deal of walking and stair climbing in her house.  Dr. Woody Seller is her PCP.  She had a pacemaker insertion in August 2017. She has a history of 1st degree AV block. She states she feels much improved since this.   Pt Diabetic: Yes, states her last A1C was 8.2 per pt, worse, was 6.2 Pt smoker: non-smoker  Pt meds include: Statin : Yes ASA: Yes Other anticoagulants/antiplatelets: no    Past Medical History:  Diagnosis Date  . Arthritis    "right shoulder" (03/02/2016)  . Asthma   . Carotid artery occlusion   . Chronic bronchitis ([place])   . Chronic diastolic heart failure ([facility])   . CKD (chronic kidney disease) stage 2, GFR 60-89 ml/min   . COPD (chronic obstructive pulmonary disease) ([facility])   . Coronary atherosclerosis of native coronary artery    Nonobstructive 08/2008  . Daily headache   . DJD (degenerative joint disease)   . Essential hypertension, benign   . GERD (gastroesophageal reflux disease)   . HLD (hyperlipidemia)   . Obesity   . On home oxygen therapy    "2L when I get to wheezing" (03/02/2016)  . PAD (peripheral artery disease) ([facility])    Moderate right renal artery stenosis 08/2008  . Presence of permanent cardiac pacemaker   . Sinus bradycardia    First degree atrioventricular block  . Type 2 diabetes mellitus ([place])     Social History Social  History  Substance Use Topics  . Smoking status: Never Smoker  . Smokeless tobacco: Never Used  . Alcohol use No    Family History Family History  Problem Relation Age of Onset  . Cancer Mother     Stomach  . Deep vein thrombosis Mother   . Diabetes Mother   . Hypertension Mother   . Heart disease Mother   . Hypertension Father   . Diabetes Sister   . Hyperlipidemia Sister   . Hypertension Sister   . Heart disease Sister   . Diabetes Brother   . Hyperlipidemia Brother   . Hypertension Brother     Surgical History Past Surgical History:  Procedure Laterality Date  . CARDIAC CATHETERIZATION  2014  . CATARACT EXTRACTION W/ INTRAOCULAR LENS  IMPLANT, BILATERAL Bilateral 2008  . ENDARTERECTOMY Right 12/08/2012   Procedure: ENDARTERECTOMY CAROTID;  Surgeon: [name], MD;  Location: [facility];  Service: Vascular;  Laterality: Right;  . EP IMPLANTABLE DEVICE N/A 03/02/2016   Procedure: Pacemaker Implant;  Surgeon: [name], MD;  Location: [facility];  Service: Cardiovascular;  Laterality: N/A;  . INSERT / REPLACE / REMOVE PACEMAKER  03/02/2016    Allergies  Allergen Reactions  . Codeine Nausea And Vomiting  . Penicillins Rash    Current Outpatient Prescriptions  Medication Sig Dispense Refill  . amLODipine (NORVASC) 5 MG tablet Take 1 tablet (5 mg total) by mouth daily. 30 tablet 6  .  aspirin EC 81 MG tablet Take 1 tablet (81 mg total) by mouth daily.    Marland Kitchen atorvastatin (LIPITOR) 40 MG tablet Take 1 tablet (40 mg total) by mouth daily. 30 tablet 1  . benazepril (LOTENSIN) 40 MG tablet Take 40 mg by mouth daily.    . diphenoxylate-atropine (LOMOTIL) 2.5-0.025 MG tablet Take 1 tablet by mouth as needed for diarrhea or loose stools.     . furosemide (LASIX) 40 MG tablet take 1 tablet by mouth once daily 15 tablet 0  . glipiZIDE (GLUCOTROL XL) 10 MG 24 hr tablet Take 10 mg by mouth daily.    . insulin aspart protamine- aspart (NOVOLOG 70/30) (70-30) 100 UNIT/ML  injection Inject 48-58 Units into the skin 2 (two) times daily with a meal. 58 units in AM and 48 units in PM    . metFORMIN (GLUCOPHAGE) 500 MG tablet Take 1,000 mg by mouth 2 (two) times daily.    Marland Kitchen olopatadine (PATANOL) 0.1 % ophthalmic solution Place 1 drop into both eyes daily.    Marland Kitchen omeprazole (PRILOSEC) 20 MG capsule Take 20 mg by mouth daily.    . potassium chloride SA (K-DUR,KLOR-CON) 20 MEQ tablet Take 20 mEq by mouth daily.    Marland Kitchen triamterene-hydrochlorothiazide (DYAZIDE) 37.5-25 MG capsule Take 1/2 tablet by mouth daily    . venlafaxine XR (EFFEXOR-XR) 150 MG 24 hr capsule Take 150 mg by mouth daily.     No current facility-administered medications for this visit.     Review of Systems : See HPI for pertinent positives and negatives.  Physical Examination  Vitals:   04/16/16 1508 04/16/16 1511  BP: 135/80 (!) 151/85  Pulse: 71   Resp: 16   Temp: 97.5 F (36.4 C)   TempSrc: Oral   SpO2: 99%   Weight: 236 lb (107 kg)   Height: 5\' 8"  (1.727 m)    Body mass index is 35.88 kg/m.  General: WDWN obese female in NAD GAIT: normal Eyes: PERRLA Pulmonary: Respirations are non-labored, CTAB, no rales, no rhonchi, & no wheezing.  Cardiac: regular rhythm, no detected murmur. Pacemaker palpated left upper chest.  VASCULAR EXAM Carotid Bruits Left Right   Negative Negative   Radial pulses are 2+ palpable and equal.  Pedal pulses are not palpable but feet are warm with no signs of ischemia.    Gastrointestinal: soft, nontender, BS WNL, no r/g,no palpable masses.  Musculoskeletal: No muscle atrophy/wasting. M/S 5/5 throughout, Extremities without ischemic changes.  Neurologic: A&O X 3; Appropriate Affect, Speech is normal CN 2-12 intact, Pain and light touch intact in extremities, Motor exam as listed  above.      Assessment: Miranda Sexton is a 75 y.o. female who is status post right carotid endarterectomy on 12/08/12. She has no history of stroke or TIA. Pt's atherosclerotic risk factors include controled DM, hypertension, and obesity. Fortunately she has no history of tobacco use and walks a great deal.  DATA Today's carotid duplex suggests a widely patent right carotid endarterectomy without evidence of restenosis or hyperplasia and 40%-59% left internal carotid artery stenosis. Both vertebral arteries are antegrade, both subclavian arteries are multiphasic. Increased stenosis of the left ICA compared to exam on 03/07/15.    Plan: Follow-up in 1 year with Carotid Duplex scan.   I discussed in depth with the patient the nature of atherosclerosis, and emphasized the importance of maximal medical management including strict control of blood pressure, blood glucose, and lipid levels, obtaining regular exercise, and continued cessation of  smoking.  The patient is aware that without maximal medical management the underlying atherosclerotic disease process will progress, limiting the benefit of any interventions. The patient was given information about stroke prevention and what symptoms should prompt the patient to seek immediate medical care. Thank you for allowing [place] to participate in this patient's care.  Clemon Chambers, RN, MSN, FNP-C [facility] Office: [phone]  Clinic Physician: Donzetta Matters on call  04/16/16 3:32 PM

## 2016-05-05 ENCOUNTER — Ambulatory Visit (INDEPENDENT_AMBULATORY_CARE_PROVIDER_SITE_OTHER): Payer: Medicare HMO

## 2016-05-05 DIAGNOSIS — Z23 Encounter for immunization: Secondary | ICD-10-CM

## 2016-05-12 ENCOUNTER — Encounter: Payer: Self-pay | Admitting: Internal Medicine

## 2016-05-12 ENCOUNTER — Ambulatory Visit (INDEPENDENT_AMBULATORY_CARE_PROVIDER_SITE_OTHER): Payer: Medicare HMO | Admitting: Internal Medicine

## 2016-05-12 VITALS — BP 120/68 | HR 70 | Ht 68.0 in | Wt 240.0 lb

## 2016-05-12 DIAGNOSIS — I495 Sick sinus syndrome: Secondary | ICD-10-CM

## 2016-05-12 DIAGNOSIS — I4819 Other persistent atrial fibrillation: Secondary | ICD-10-CM

## 2016-05-12 DIAGNOSIS — I119 Hypertensive heart disease without heart failure: Secondary | ICD-10-CM

## 2016-05-12 DIAGNOSIS — I5032 Chronic diastolic (congestive) heart failure: Secondary | ICD-10-CM

## 2016-05-12 DIAGNOSIS — I481 Persistent atrial fibrillation: Secondary | ICD-10-CM | POA: Diagnosis not present

## 2016-05-12 LAB — CUP PACEART INCLINIC DEVICE CHECK
Battery Voltage: 3.03 V
Brady Statistic AP VP Percent: 84.47 %
Brady Statistic AS VP Percent: 15.14 %
Brady Statistic RA Percent Paced: 80.48 %
Brady Statistic RV Percent Paced: 98.37 %
Date Time Interrogation Session: 20171108165437
Implantable Lead Implant Date: 20170829
Implantable Lead Location: 753859
Implantable Lead Model: 3830
Implantable Lead Model: 5076
Implantable Pulse Generator Implant Date: 20170829
Lead Channel Impedance Value: 304 Ohm
Lead Channel Impedance Value: 494 Ohm
Lead Channel Pacing Threshold Amplitude: 0.75 V
Lead Channel Pacing Threshold Pulse Width: 0.4 ms
Lead Channel Sensing Intrinsic Amplitude: 1 mV
Lead Channel Setting Pacing Amplitude: 2.5 V
Lead Channel Setting Sensing Sensitivity: 0.9 mV
MDC IDC LEAD IMPLANT DT: 20170829
MDC IDC LEAD LOCATION: 753860
MDC IDC MSMT BATTERY REMAINING LONGEVITY: 93 mo
MDC IDC MSMT LEADCHNL RA IMPEDANCE VALUE: 323 Ohm
MDC IDC MSMT LEADCHNL RA IMPEDANCE VALUE: 437 Ohm
MDC IDC MSMT LEADCHNL RA PACING THRESHOLD PULSEWIDTH: 0.4 ms
MDC IDC MSMT LEADCHNL RV PACING THRESHOLD AMPLITUDE: 1.25 V
MDC IDC MSMT LEADCHNL RV SENSING INTR AMPL: 4.5 mV
MDC IDC SET LEADCHNL RA PACING AMPLITUDE: 3.5 V
MDC IDC SET LEADCHNL RV PACING PULSEWIDTH: 0.4 ms
MDC IDC STAT BRADY AP VS PERCENT: 0.09 %
MDC IDC STAT BRADY AS VS PERCENT: 0.3 %

## 2016-05-12 MED ORDER — APIXABAN 5 MG PO TABS: 5.0000 mg | ORAL_TABLET | Freq: Two times a day (BID) | ORAL | 0 refills | Status: DC

## 2016-05-12 MED ORDER — APIXABAN 5 MG PO TABS: 5.0000 mg | ORAL_TABLET | Freq: Two times a day (BID) | ORAL | 6 refills | Status: DC

## 2016-05-12 NOTE — Patient Instructions (Addendum)
Medication Instructions:   Stop Aspirin.  Begin Eliquis 5mg  twice a day.    Continue all other medications.    Labwork: none  Testing/Procedures: none  Follow-Up:  Your physician wants you to follow up in: 6 months.  You will receive a reminder letter in the mail one-two months in advance.  If you don't receive a letter, please call our office to schedule the follow up appointment - Dr. Domenic Polite.    3 months - Dr. Rayann Heman.   Any Other Special Instructions Will Be Listed Below (If Applicable). Remote monitoring is used to monitor your Pacemaker of ICD from home. This monitoring reduces the number of office visits required to check your device to one time per year. It allows [place] to keep an eye on the functioning of your device to ensure it is working properly. You are scheduled for a device check from home on 08/11/2016. You may send your transmission at any time that day. If you have a wireless device, the transmission will be sent automatically. After your physician reviews your transmission, you will receive a postcard with your next transmission date.  If you need a refill on your cardiac medications before your next appointment, please call your pharmacy.

## 2016-05-12 NOTE — Progress Notes (Signed)
Electrophysiology Office Note   Date:  05/12/2016   ID:  Miranda Sexton, DOB 08-Feb-1941, MRN [id number]  PCP:  Glenda Chroman, MD  Cardiologist:  Domenic Polite Primary Electrophysiologist: [name], MD    Chief Complaint  Patient presents with  . Left lower leg swelling  . Shortness of Breath     History of Present Illness: Miranda Sexton is a 75 y.o. female who presents today for electrophysiology evaluation.  She has done well since her PPM was implanted.  Fatigue and dizziness are much improved.  She is also pleased that her SOB is improved.  She still has some SOB and edema. Today, she denies symptoms of palpitations, orthopnea, PND,presyncope, syncope, bleeding, or neurologic sequela. The patient is tolerating medications without difficulties and is otherwise without complaint today.    Past Medical History:  Diagnosis Date  . Arthritis    "right shoulder" (03/02/2016)  . Asthma   . Carotid artery occlusion   . Chronic bronchitis ([place])   . Chronic diastolic heart failure ([place])   . CKD (chronic kidney disease) stage 2, GFR 60-89 ml/min   . COPD (chronic obstructive pulmonary disease) ([place])   . Coronary atherosclerosis of native coronary artery    Nonobstructive 08/2008  . Daily headache   . DJD (degenerative joint disease)   . Essential hypertension, benign   . GERD (gastroesophageal reflux disease)   . HLD (hyperlipidemia)   . Obesity   . On home oxygen therapy    "2L when I get to wheezing" (03/02/2016)  . PAD (peripheral artery disease) ([facility])    Moderate right renal artery stenosis 08/2008  . Presence of permanent cardiac pacemaker   . Sinus bradycardia    First degree atrioventricular block  . Type 2 diabetes mellitus ([place])    Past Surgical History:  Procedure Laterality Date  . CARDIAC CATHETERIZATION  2014  . CATARACT EXTRACTION W/ INTRAOCULAR LENS  IMPLANT, BILATERAL Bilateral 2008  . ENDARTERECTOMY Right 12/08/2012   Procedure: ENDARTERECTOMY CAROTID;   Surgeon: [name], MD;  Location: [facility];  Service: Vascular;  Laterality: Right;  . EP IMPLANTABLE DEVICE N/A 03/02/2016   Procedure: Pacemaker Implant;  Surgeon: [name], MD;  Location: [facility];  Service: Cardiovascular;  Laterality: N/A;  . INSERT / REPLACE / REMOVE PACEMAKER  03/02/2016     Current Outpatient Prescriptions  Medication Sig Dispense Refill  . amLODipine (NORVASC) 5 MG tablet Take 1 tablet (5 mg total) by mouth daily. 30 tablet 6  . atorvastatin (LIPITOR) 40 MG tablet Take 1 tablet (40 mg total) by mouth daily. 30 tablet 1  . benazepril (LOTENSIN) 40 MG tablet Take 40 mg by mouth daily.    . diphenoxylate-atropine (LOMOTIL) 2.5-0.025 MG tablet Take 1 tablet by mouth as needed for diarrhea or loose stools.     . furosemide (LASIX) 40 MG tablet take 1 tablet by mouth once daily 15 tablet 0  . glipiZIDE (GLUCOTROL XL) 10 MG 24 hr tablet Take 10 mg by mouth daily.    . insulin aspart protamine- aspart (NOVOLOG 70/30) (70-30) 100 UNIT/ML injection Inject 48-58 Units into the skin 2 (two) times daily with a meal. 58 units in AM and 48 units in PM    . metFORMIN (GLUCOPHAGE) 500 MG tablet Take 1,000 mg by mouth 2 (two) times daily.    Marland Kitchen olopatadine (PATANOL) 0.1 % ophthalmic solution Place 1 drop into both eyes daily.    Marland Kitchen omeprazole (PRILOSEC) 20 MG  capsule Take 20 mg by mouth daily.    . potassium chloride SA (K-DUR,KLOR-CON) 20 MEQ tablet Take 20 mEq by mouth daily.    Marland Kitchen triamterene-hydrochlorothiazide (DYAZIDE) 37.5-25 MG capsule Take 1/2 tablet by mouth daily    . venlafaxine XR (EFFEXOR-XR) 150 MG 24 hr capsule Take 150 mg by mouth daily.    Marland Kitchen apixaban (ELIQUIS) 5 MG TABS tablet Take 1 tablet (5 mg total) by mouth 2 (two) times daily. 56 tablet 0   No current facility-administered medications for this visit.     Allergies:   Codeine and Penicillins   Social History:  The patient  reports that she has never smoked. She has never used smokeless tobacco.  She reports that she does not drink alcohol or use drugs.   Family History:  The patient's  family history includes Cancer in her mother; Deep vein thrombosis in her mother; Diabetes in her brother, mother, and sister; Heart disease in her mother and sister; Hyperlipidemia in her brother and sister; Hypertension in her brother, father, mother, and sister.    ROS:  Please see the history of present illness.   All other systems are reviewed and negative.    PHYSICAL EXAM: VS:  BP 120/68   Pulse 70   Ht 5\' 8"  (1.727 m)   Wt 240 lb (108.9 kg)   SpO2 98% Comment: on room air  BMI 36.49 kg/m  , BMI Body mass index is 36.49 kg/m. GEN: Well nourished, well developed, in no acute distress  HEENT: normal  Neck: no JVD, carotid bruits, or masses Cardiac: RRR (paced)  Respiratory:  clear to auscultation bilaterally, normal work of breathing GI: soft, nontender, nondistended, + BS MS: no deformity or atrophy  Skin: warm and dry, pacemaker pocket is well healed Neuro:  Strength and sensation are intact Psych: euthymic mood, full affect  EKG:  EKG is ordered today. The ekg ordered today shows afib with V pacing (His bundle)    Lipid Panel     Component Value Date/Time   CHOL (H) 08/28/2008 0520    206        ATP III CLASSIFICATION:  <200     mg/dL   Desirable  200-239  mg/dL   Borderline High  >=240    mg/dL   High          TRIG 195 (H) 08/28/2008 0520   HDL 32 (L) 08/28/2008 0520   CHOLHDL 6.4 08/28/2008 0520   VLDL 39 08/28/2008 0520   LDLCALC (H) 08/28/2008 0520    135        Total Cholesterol/HDL:CHD Risk Coronary Heart Disease Risk Table                     Men   Women  1/2 Average Risk   3.4   3.3  Average Risk       5.0   4.4  2 X Average Risk   9.6   7.1  3 X Average Risk  23.4   11.0        Use the calculated Patient Ratio above and the CHD Risk Table to determine the patient's CHD Risk.        ATP III CLASSIFICATION (LDL):  <100     mg/dL   Optimal  100-129   mg/dL   Near or Above                    Optimal  130-159  mg/dL  Borderline  160-189  mg/dL   High  >190     mg/dL   Very High     Wt Readings from Last 3 Encounters:  05/12/16 240 lb (108.9 kg)  04/16/16 236 lb (107 kg)  03/03/16 236 lb 4.8 oz (107.2 kg)     ASSESSMENT AND PLAN:  1.  Sick sinus syndrome/ marked first degree AV block Normal pacemaker function Interestingly, today she is in afib with an escape V rate 40s.  She is s/p PPM with His bundle pacing.  Today, nonselective capture is above 2.25 V @0 .9msec.  She has selective capture from 2V down to loss of capture at [facility] @0 .56msec. I have programmed today at 2V @0 .4 msec to promote narrow QRS conduction  2. Persistent afib In afib today with undersensing.  P wave sensitivity adjusted (was at 0.9 mV) today.  This patients CHA2DS2-VASc Score and unadjusted Ischemic Stroke Rate (% per year) is equal to 11.2 % stroke rate/year from a score of 7 Today, I discussed Coumadin as well as novel anticoagulants including Pradaxa, Xarelto, Savaysa, and Eliquis today as indicated for risk reduction in stroke and systemic emboli with nonvalvular atrial fibrillation.  Risks, benefits, and alternatives to each of these drugs were discussed at length today.  She would prefer eliquis which her husband is also taking. Stop ASA and start eliquis 5mg  BID today.  She has follow-up already scheduled for Dr Woody Seller later this month  3.  Hypertensive cardiovascular disease On multiple antihypertensive medicinces ? RAS Would consider referral to Dr Gwenlyn Found for further evaluation (will defer to Drs McDowell/ Branch)  4. Diastolic dysfunction Avoid salt Weight reduction Reduce diuretics as above  Current medicines are reviewed at length with the patient today.   The patient does not have concerns regarding her medicines.  The following changes were made today:  none  Return to see me in 3 months for further evaluation of His pacing  Signed, [name], MD  05/12/2016 4:21 PM     [address] [place] [address] [phone] (office) [phone] (fax)

## 2016-05-14 ENCOUNTER — Encounter: Payer: Medicare HMO | Admitting: Internal Medicine

## 2016-05-18 NOTE — Addendum Note (Signed)
Addended by: Mena Goes on: 05/18/2016 03:14 PM   Modules accepted: Orders

## 2016-06-04 ENCOUNTER — Encounter: Payer: Medicare HMO | Admitting: Internal Medicine

## 2016-06-07 ENCOUNTER — Encounter: Payer: Medicare HMO | Admitting: Internal Medicine

## 2016-07-19 ENCOUNTER — Other Ambulatory Visit: Payer: Self-pay | Admitting: Internal Medicine

## 2016-07-23 ENCOUNTER — Other Ambulatory Visit (HOSPITAL_COMMUNITY): Payer: Self-pay | Admitting: Cardiology

## 2016-08-06 ENCOUNTER — Ambulatory Visit (INDEPENDENT_AMBULATORY_CARE_PROVIDER_SITE_OTHER): Payer: Medicare HMO | Admitting: Internal Medicine

## 2016-08-06 ENCOUNTER — Encounter: Payer: Self-pay | Admitting: Internal Medicine

## 2016-08-06 VITALS — BP 137/69 | HR 88 | Ht 67.0 in | Wt 242.0 lb

## 2016-08-06 DIAGNOSIS — R001 Bradycardia, unspecified: Secondary | ICD-10-CM | POA: Diagnosis not present

## 2016-08-06 DIAGNOSIS — I4819 Other persistent atrial fibrillation: Secondary | ICD-10-CM

## 2016-08-06 DIAGNOSIS — I481 Persistent atrial fibrillation: Secondary | ICD-10-CM

## 2016-08-06 DIAGNOSIS — I495 Sick sinus syndrome: Secondary | ICD-10-CM

## 2016-08-06 DIAGNOSIS — I119 Hypertensive heart disease without heart failure: Secondary | ICD-10-CM

## 2016-08-06 MED ORDER — FLECAINIDE ACETATE 50 MG PO TABS: 50.0000 mg | ORAL_TABLET | Freq: Two times a day (BID) | ORAL | 6 refills | Status: DC

## 2016-08-06 NOTE — Patient Instructions (Addendum)
Medication Instructions:   Begin Flecainide 50mg  twice a day.  Continue all other medications.    Labwork: none  Testing/Procedures: none  Follow-Up: 4 weeks   Any Other Special Instructions Will Be Listed Below (If Applicable).  Nurse visit on Monday, 08/09/2016 for EKG.   Remote monitoring is used to monitor your Pacemaker of ICD from home. This monitoring reduces the number of office visits required to check your device to one time per year. It allows [place] to keep an eye on the functioning of your device to ensure it is working properly. You are scheduled for a device check from home on 11/08/2016.   You may send your transmission at any time that day. If you have a wireless device, the transmission will be sent automatically. After your physician reviews your transmission, you will receive a postcard with your next transmission date.  If you need a refill on your cardiac medications before your next appointment, please call your pharmacy.

## 2016-08-06 NOTE — Progress Notes (Signed)
Electrophysiology Office Note   Date:  08/06/2016   ID:  Miranda, Sexton 1941-05-10, MRN [id number]  PCP:  Glenda Chroman, MD  Cardiologist:  Domenic Polite Primary Electrophysiologist: [name], MD    CC: afib   History of Present Illness: Miranda Sexton is a 76 y.o. female who presents today for electrophysiology evaluation.  She has done well since her last visit.   She still has some SOB and edema.  Today, she denies symptoms of palpitations, orthopnea, PND,presyncope, syncope, bleeding, or neurologic sequela. The patient is tolerating medications without difficulties and is otherwise without complaint today.    Past Medical History:  Diagnosis Date  . Arthritis    "right shoulder" (03/02/2016)  . Asthma   . Carotid artery occlusion   . Chronic bronchitis ([place])   . Chronic diastolic heart failure ([place])   . CKD (chronic kidney disease) stage 2, GFR 60-89 ml/min   . COPD (chronic obstructive pulmonary disease) ([place])   . Coronary atherosclerosis of native coronary artery    Nonobstructive 08/2008  . Daily headache   . DJD (degenerative joint disease)   . Essential hypertension, benign   . GERD (gastroesophageal reflux disease)   . HLD (hyperlipidemia)   . Obesity   . On home oxygen therapy    "2L when I get to wheezing" (03/02/2016)  . PAD (peripheral artery disease) ([facility])    Moderate right renal artery stenosis 08/2008  . Persistent atrial fibrillation ([place]) 05/12/2016   observed on EP follow-up visit.  started on eliquis  . Presence of permanent cardiac pacemaker   . Sinus bradycardia    First degree atrioventricular block  . Type 2 diabetes mellitus ([place])    Past Surgical History:  Procedure Laterality Date  . CARDIAC CATHETERIZATION  2014  . CATARACT EXTRACTION W/ INTRAOCULAR LENS  IMPLANT, BILATERAL Bilateral 2008  . ENDARTERECTOMY Right 12/08/2012   Procedure: ENDARTERECTOMY CAROTID;  Surgeon: [name], MD;  Location: [facility];  Service: Vascular;  Laterality:  Right;  . EP IMPLANTABLE DEVICE N/A 03/02/2016   Procedure: Pacemaker Implant;  Surgeon: [name], MD;  Location: [facility];  Service: Cardiovascular;  Laterality: N/A;  . INSERT / REPLACE / REMOVE PACEMAKER  03/02/2016     Current Outpatient Prescriptions  Medication Sig Dispense Refill  . amLODipine (NORVASC) 5 MG tablet Take 1 tablet (5 mg total) by mouth daily. 30 tablet 6  . apixaban (ELIQUIS) 5 MG TABS tablet Take 1 tablet (5 mg total) by mouth 2 (two) times daily. 56 tablet 0  . atorvastatin (LIPITOR) 40 MG tablet Take 1 tablet (40 mg total) by mouth daily. 30 tablet 1  . benazepril (LOTENSIN) 40 MG tablet Take 40 mg by mouth daily.    Marland Kitchen ELIQUIS 5 MG TABS tablet take 1 tablet by mouth twice a day 60 tablet 6  . furosemide (LASIX) 40 MG tablet take 1 tablet by mouth once daily 15 tablet 0  . glipiZIDE (GLUCOTROL XL) 10 MG 24 hr tablet Take 10 mg by mouth daily.    . insulin aspart protamine- aspart (NOVOLOG 70/30) (70-30) 100 UNIT/ML injection Inject 48-58 Units into the skin 2 (two) times daily with a meal. 58 units in AM and 48 units in PM    . metFORMIN (GLUCOPHAGE) 500 MG tablet Take 1,000 mg by mouth 2 (two) times daily.    Marland Kitchen olopatadine (PATANOL) 0.1 % ophthalmic solution Place 1 drop into both eyes daily.    Marland Kitchen  omeprazole (PRILOSEC) 20 MG capsule Take 20 mg by mouth daily.    . potassium chloride SA (K-DUR,KLOR-CON) 20 MEQ tablet Take 20 mEq by mouth daily.    Marland Kitchen triamterene-hydrochlorothiazide (DYAZIDE) 37.5-25 MG capsule Take 1/2 tablet by mouth daily    . venlafaxine XR (EFFEXOR-XR) 150 MG 24 hr capsule Take 150 mg by mouth daily.    . diphenoxylate-atropine (LOMOTIL) 2.5-0.025 MG tablet Take 1 tablet by mouth as needed for diarrhea or loose stools.     . meclizine (ANTIVERT) 25 MG tablet take 1/2 to 1 tablet by mouth three times a day if needed and 1 tablet by mouth at bedtime  0   No current facility-administered medications for this visit.     Allergies:    Codeine and Penicillins   Social History:  The patient  reports that she has never smoked. She has never used smokeless tobacco. She reports that she does not drink alcohol or use drugs.   Family History:  The patient's  family history includes Cancer in her mother; Deep vein thrombosis in her mother; Diabetes in her brother, mother, and sister; Heart disease in her mother and sister; Hyperlipidemia in her brother and sister; Hypertension in her brother, father, mother, and sister.    ROS:  Please see the history of present illness.   All other systems are reviewed and negative.    PHYSICAL EXAM: VS:  BP 137/69   Pulse 88   Ht 5\' 7"  (1.702 m)   Wt 242 lb (109.8 kg)   SpO2 90%   BMI 37.90 kg/m  , BMI Body mass index is 37.9 kg/m. GEN: Well nourished, well developed, in no acute distress  HEENT: normal  Neck: no JVD, carotid bruits, or masses Cardiac: RRR (paced)  Respiratory:  clear to auscultation bilaterally, normal work of breathing GI: soft, nontender, nondistended, + BS MS: no deformity or atrophy  Skin: warm and dry, pacemaker pocket is well healed Neuro:  Strength and sensation are intact Psych: euthymic mood, full affect  EKG:  EKG is ordered today. The ekg ordered today shows afib with V pacing (His bundle)    Lipid Panel     Component Value Date/Time   CHOL (H) 08/28/2008 0520    206        ATP III CLASSIFICATION:  <200     mg/dL   Desirable  200-239  mg/dL   Borderline High  >=240    mg/dL   High          TRIG 195 (H) 08/28/2008 0520   HDL 32 (L) 08/28/2008 0520   CHOLHDL 6.4 08/28/2008 0520   VLDL 39 08/28/2008 0520   LDLCALC (H) 08/28/2008 0520    135        Total Cholesterol/HDL:CHD Risk Coronary Heart Disease Risk Table                     Men   Women  1/2 Average Risk   3.4   3.3  Average Risk       5.0   4.4  2 X Average Risk   9.6   7.1  3 X Average Risk  23.4   11.0        Use the calculated Patient Ratio above and the CHD Risk Table to  determine the patient's CHD Risk.        ATP III CLASSIFICATION (LDL):  <100     mg/dL   Optimal  100-129  mg/dL  Near or Above                    Optimal  130-159  mg/dL   Borderline  160-189  mg/dL   High  >190     mg/dL   Very High     Wt Readings from Last 3 Encounters:  08/06/16 242 lb (109.8 kg)  05/12/16 240 lb (108.9 kg)  04/16/16 236 lb (107 kg)     ASSESSMENT AND PLAN:  1.  Sick sinus syndrome/ marked first degree AV block Normal pacemaker function Interestingly, today she is in afib with an escape V rate 40s.  She is s/p PPM with His bundle pacing.  Today, her threshold has risen significantly (now 2V @0 .4 msec).  Threshold is also 1.5V @1  msec.  I have therefore increased pacing output to 2.5V@1  msec.  No other changes today.  2. Persistent afib Persistent afib is noted.  She is on eliquis for chads2vasc score of 7.  She reports compliance without interruption. Therapeutic strategies for afib including rate control and rhythm were discussed in detail with the patient today.  She would like to pursue sinus rhythm.  I have therefore started on flecainide 50mg  BID today.  She will return on Monday for ekg.  Will determine if she should increase flecainide or pursue cardioversion at that time.  3.  Hypertensive cardiovascular disease On multiple antihypertensive medicinces ? RAS Would consider referral to Dr Gwenlyn Found for further evaluation (will defer to Drs McDowell/ Branch)  4. Diastolic dysfunction Avoid salt Weight reduction   Current medicines are reviewed at length with the patient today.   The patient does not have concerns regarding her medicines.  The following changes were made today:  none  Return to see me in 4 weeks for further evaluation of His pacing and afib management  Signed, [name], MD  08/06/2016 10:02 AM     [facility] [address] [place] [address] [phone] (office) [phone] (fax)

## 2016-08-09 ENCOUNTER — Ambulatory Visit (INDEPENDENT_AMBULATORY_CARE_PROVIDER_SITE_OTHER): Payer: Medicare HMO | Admitting: *Deleted

## 2016-08-09 VITALS — BP 147/76 | HR 60

## 2016-08-09 DIAGNOSIS — I481 Persistent atrial fibrillation: Secondary | ICD-10-CM | POA: Diagnosis not present

## 2016-08-09 DIAGNOSIS — I4819 Other persistent atrial fibrillation: Secondary | ICD-10-CM

## 2016-08-09 LAB — CUP PACEART INCLINIC DEVICE CHECK
Brady Statistic AP VP Percent: 0.64 %
Brady Statistic AP VS Percent: 0.01 %
Brady Statistic AS VP Percent: 86.76 %
Brady Statistic AS VS Percent: 12.59 %
Date Time Interrogation Session: 20180202155854
Implantable Lead Location: 753860
Lead Channel Impedance Value: 285 Ohm
Lead Channel Impedance Value: 323 Ohm
Lead Channel Impedance Value: 456 Ohm
Lead Channel Pacing Threshold Amplitude: 2 V
Lead Channel Pacing Threshold Pulse Width: 0.4 ms
Lead Channel Sensing Intrinsic Amplitude: 4.5 mV
Lead Channel Setting Pacing Amplitude: 2 V
Lead Channel Setting Pacing Amplitude: 2.5 V
MDC IDC LEAD IMPLANT DT: 20170829
MDC IDC LEAD IMPLANT DT: 20170829
MDC IDC LEAD LOCATION: 753859
MDC IDC MSMT BATTERY REMAINING LONGEVITY: 94 mo
MDC IDC MSMT BATTERY VOLTAGE: 3.02 V
MDC IDC MSMT LEADCHNL RA PACING THRESHOLD AMPLITUDE: 0.75 V
MDC IDC MSMT LEADCHNL RA PACING THRESHOLD PULSEWIDTH: 0.4 ms
MDC IDC MSMT LEADCHNL RA SENSING INTR AMPL: 0.625 mV
MDC IDC MSMT LEADCHNL RA SENSING INTR AMPL: 1.25 mV
MDC IDC MSMT LEADCHNL RV IMPEDANCE VALUE: 494 Ohm
MDC IDC MSMT LEADCHNL RV SENSING INTR AMPL: 4.375 mV
MDC IDC PG IMPLANT DT: 20170829
MDC IDC SET LEADCHNL RV PACING PULSEWIDTH: 1 ms
MDC IDC SET LEADCHNL RV SENSING SENSITIVITY: 0.9 mV
MDC IDC STAT BRADY RA PERCENT PACED: 0.42 %
MDC IDC STAT BRADY RV PERCENT PACED: 82.78 %

## 2016-08-09 NOTE — Progress Notes (Signed)
Patient in office this morning for EKG.  Recently began Flecainide 50mg  twice a day on 08/06/2016.  EKG will be scanned & routed to Dr. Rayann Heman for review.  Patient stated that she had taken her am meds prior to coming for visit.  Stated that she feels the same since beginning Flecainide, no better / no worse.

## 2016-08-12 NOTE — Progress Notes (Signed)
Left message to return call 

## 2016-08-13 MED ORDER — FLECAINIDE ACETATE 50 MG PO TABS: 100.0000 mg | ORAL_TABLET | Freq: Two times a day (BID) | ORAL | Status: DC

## 2016-08-13 NOTE — Progress Notes (Signed)
Patient notified.  Nurse visit scheduled for next Thursday, 08/19/16.

## 2016-08-19 ENCOUNTER — Ambulatory Visit (INDEPENDENT_AMBULATORY_CARE_PROVIDER_SITE_OTHER): Payer: Medicare HMO

## 2016-08-19 DIAGNOSIS — I4819 Other persistent atrial fibrillation: Secondary | ICD-10-CM

## 2016-08-19 DIAGNOSIS — I481 Persistent atrial fibrillation: Secondary | ICD-10-CM | POA: Diagnosis not present

## 2016-08-19 NOTE — Progress Notes (Signed)
Patient is here for a follow up EKG. She denies any symptoms today. She is compliant with her medication. EKG reviewed by Dr. Bronson Ing. She is in A fib today. Per last office visit Cardioversion was discussed & patient is in agreement but will call us back when she has arranged transportation. Will forward to Dr. Rayann Heman.

## 2016-08-24 ENCOUNTER — Encounter (HOSPITAL_COMMUNITY): Payer: Self-pay

## 2016-08-24 ENCOUNTER — Encounter (HOSPITAL_COMMUNITY)
Admission: RE | Admit: 2016-08-24 | Discharge: 2016-08-24 | Disposition: A | Discharge status: Home / Self Care | Payer: Medicare HMO | Source: Ambulatory Visit | Attending: Cardiovascular Disease | Admitting: Cardiovascular Disease

## 2016-08-24 ENCOUNTER — Other Ambulatory Visit: Payer: Self-pay | Admitting: Cardiovascular Disease

## 2016-08-24 DIAGNOSIS — I4891 Unspecified atrial fibrillation: Secondary | ICD-10-CM

## 2016-08-24 HISTORY — DX: Anxiety disorder, unspecified: F41.9

## 2016-08-24 LAB — COMPREHENSIVE METABOLIC PANEL
ALK PHOS: 66 U/L (ref 38–126)
ALT: 25 U/L (ref 14–54)
AST: 28 U/L (ref 15–41)
Albumin: 3.3 g/dL — ABNORMAL LOW (ref 3.5–5.0)
Anion gap: 7 (ref 5–15)
BILIRUBIN TOTAL: 0.3 mg/dL (ref 0.3–1.2)
BUN: 16 mg/dL (ref 6–20)
CALCIUM: 8.9 mg/dL (ref 8.9–10.3)
CO2: 29 mmol/L (ref 22–32)
CREATININE: 1.06 mg/dL — AB (ref 0.44–1.00)
Chloride: 104 mmol/L (ref 101–111)
GFR calc non Af Amer: 50 mL/min — ABNORMAL LOW (ref >60)
GFR, EST AFRICAN AMERICAN: 58 mL/min — AB (ref >60)
GLUCOSE: 168 mg/dL — AB (ref 65–99)
Potassium: 4.2 mmol/L (ref 3.5–5.1)
SODIUM: 140 mmol/L (ref 135–145)
TOTAL PROTEIN: 7 g/dL (ref 6.5–8.1)

## 2016-08-24 LAB — CBC WITH DIFFERENTIAL/PLATELET
Basophils Absolute: 0 10*3/uL (ref 0.0–0.1)
Basophils Relative: 1 %
EOS ABS: 0.4 10*3/uL (ref 0.0–0.7)
Eosinophils Relative: 5 %
HEMATOCRIT: 35.3 % — AB (ref 36.0–46.0)
HEMOGLOBIN: 11 g/dL — AB (ref 12.0–15.0)
LYMPHS ABS: 1.6 10*3/uL (ref 0.7–4.0)
LYMPHS PCT: 23 %
MCH: 28.8 pg (ref 26.0–34.0)
MCHC: 31.2 g/dL (ref 30.0–36.0)
MCV: 92.4 fL (ref 78.0–100.0)
MONOS PCT: 9 %
Monocytes Absolute: 0.6 10*3/uL (ref 0.1–1.0)
NEUTROS ABS: 4.1 10*3/uL (ref 1.7–7.7)
NEUTROS PCT: 62 %
Platelets: 305 10*3/uL (ref 150–400)
RBC: 3.82 MIL/uL — AB (ref 3.87–5.11)
RDW: 15.3 % (ref 11.5–15.5)
WBC: 6.7 10*3/uL (ref 4.0–10.5)

## 2016-08-24 LAB — PROTIME-INR
INR: 1.06
Prothrombin Time: 13.8 seconds (ref 11.4–15.2)

## 2016-08-24 NOTE — Patient Instructions (Signed)
Miranda Sexton  08/24/2016     @PREFPERIOPPHARMACY @   Your procedure is scheduled on 08/27/2016  Report to [facility] at  800  A.M.  Call this number if you have problems the morning of surgery:  [phone]   Remember:  Do not eat food or drink liquids after midnight.  Take these medicines the morning of surgery with A SIP OF WATER  Norvasc, lotensin, flecanide, prilosec, effexor. Take 1/2 of your usual insulin dosage the night before. DO NOT take any medication for diabetes the morning of surgery. Make sure you continue to take your eliquis.   Do not wear jewelry, make-up or nail polish.  Do not wear lotions, powders, or perfumes, or deoderant.  Do not shave 48 hours prior to surgery.  Men may shave face and neck.  Do not bring valuables to the hospital.  [facility] is not responsible for any belongings or valuables.  Contacts, dentures or bridgework may not be worn into surgery.  Leave your suitcase in the car.  After surgery it may be brought to your room.  For patients admitted to the hospital, discharge time will be determined by your treatment team.  Patients discharged the day of surgery will not be allowed to drive home.   Name and phone number of your driver:   family Special instructions:  None  Please read over the following fact sheets that you were given. Anesthesia Post-op Instructions and Care and Recovery After Surgery       Electrical Cardioversion Electrical cardioversion is the delivery of a jolt of electricity to restore a normal rhythm to the heart. A rhythm that is too fast or is not regular keeps the heart from pumping well. In this procedure, sticky patches or metal paddles are placed on the chest to deliver electricity to the heart from a device. This procedure may be done in an emergency if:  There is low or no blood pressure as a result of the heart rhythm.  Normal rhythm must be restored as fast as possible to protect the brain and  heart from further damage.  It may save a life. This procedure may also be done for irregular or fast heart rhythms that are not immediately life-threatening. Tell a health care provider about:  Any allergies you have.  All medicines you are taking, including vitamins, herbs, eye drops, creams, and over-the-counter medicines.  Any problems you or family members have had with anesthetic medicines.  Any blood disorders you have.  Any surgeries you have had.  Any medical conditions you have.  Whether you are pregnant or may be pregnant. What are the risks? Generally, this is a safe procedure. However, problems may occur, including:  Allergic reactions to medicines.  A blood clot that breaks free and travels to other parts of your body.  The possible return of an abnormal heart rhythm within hours or days after the procedure.  Your heart stopping (cardiac arrest). This is rare. What happens before the procedure? Medicines  Your health care provider may have you start taking:  Blood-thinning medicines (anticoagulants) so your blood does not clot as easily.  Medicines may be given to help stabilize your heart rate and rhythm.  Ask your health care provider about changing or stopping your regular medicines. This is especially important if you are taking diabetes medicines or blood thinners. General instructions  Plan to have someone take you home from the hospital or clinic.  If  you will be going home right after the procedure, plan to have someone with you for 24 hours.  Follow instructions from your health care provider about eating or drinking restrictions. What happens during the procedure?  To lower your risk of infection:  Your health care team will wash or sanitize their hands.  Your skin will be washed with soap.  An IV tube will be inserted into one of your veins.  You will be given a medicine to help you relax (sedative).  Sticky patches (electrodes) or  metal paddles may be placed on your chest.  An electrical shock will be delivered. The procedure may vary among health care providers and hospitals. What happens after the procedure?  Your blood pressure, heart rate, breathing rate, and blood oxygen level will be monitored until the medicines you were given have worn off.  Do not drive for 24 hours if you were given a sedative.  Your heart rhythm will be watched to make sure it does not change. This information is not intended to replace advice given to you by your health care provider. Make sure you discuss any questions you have with your health care provider. Document Released: 06/11/2002 Document Revised: 02/18/2016 Document Reviewed: 12/26/2015 Elsevier Interactive Patient Education  2017 [employer].  Electrical Cardioversion, Care After This sheet gives you information about how to care for yourself after your procedure. Your health care provider may also give you more specific instructions. If you have problems or questions, contact your health care provider. What can I expect after the procedure? After the procedure, it is common to have:  Some redness on the skin where the shocks were given. Follow these instructions at home:  Do not drive for 24 hours if you were given a medicine to help you relax (sedative).  Take over-the-counter and prescription medicines only as told by your health care provider.  Ask your health care provider how to check your pulse. Check it often.  Rest for 48 hours after the procedure or as told by your health care provider.  Avoid or limit your caffeine use as told by your health care provider. Contact a health care provider if:  You feel like your heart is beating too quickly or your pulse is not regular.  You have a serious muscle cramp that does not go away. Get help right away if:  You have discomfort in your chest.  You are dizzy or you feel faint.  You have trouble breathing or  you are short of breath.  Your speech is slurred.  You have trouble moving an arm or leg on one side of your body.  Your fingers or toes turn cold or blue. This information is not intended to replace advice given to you by your health care provider. Make sure you discuss any questions you have with your health care provider. Document Released: 04/11/2013 Document Revised: 01/23/2016 Document Reviewed: 12/26/2015 Elsevier Interactive Patient Education  2017 [employer] PATIENT INSTRUCTIONS POST-ANESTHESIA  IMMEDIATELY FOLLOWING SURGERY:  Do not drive or operate machinery for the first twenty four hours after surgery.  Do not make any important decisions for twenty four hours after surgery or while taking narcotic pain medications or sedatives.  If you develop intractable nausea and vomiting or a severe headache please notify your doctor immediately.  FOLLOW-UP:  Please make an appointment with your surgeon as instructed. You do not need to follow up with anesthesia unless specifically instructed to do so.  WOUND CARE INSTRUCTIONS (if  applicable):  Keep a dry clean dressing on the anesthesia/puncture wound site if there is drainage.  Once the wound has quit draining you may leave it open to air.  Generally you should leave the bandage intact for twenty four hours unless there is drainage.  If the epidural site drains for more than 36-48 hours please call the anesthesia department.  QUESTIONS?:  Please feel free to call your physician or the hospital operator if you have any questions, and they will be happy to assist you.

## 2016-08-27 ENCOUNTER — Ambulatory Visit (HOSPITAL_COMMUNITY)
Admission: RE | Admit: 2016-08-27 | Discharge: 2016-08-27 | Disposition: A | Discharge status: Home / Self Care | Payer: Medicare HMO | Source: Ambulatory Visit | Attending: Cardiovascular Disease | Admitting: Cardiovascular Disease

## 2016-08-27 ENCOUNTER — Encounter (HOSPITAL_COMMUNITY): Payer: Self-pay | Admitting: Anesthesiology

## 2016-08-27 ENCOUNTER — Encounter (HOSPITAL_COMMUNITY)
Admission: RE | Disposition: A | Discharge status: Home / Self Care | Payer: Self-pay | Source: Ambulatory Visit | Attending: Cardiovascular Disease

## 2016-08-27 DIAGNOSIS — I4891 Unspecified atrial fibrillation: Secondary | ICD-10-CM | POA: Diagnosis not present

## 2016-08-27 LAB — GLUCOSE, CAPILLARY: GLUCOSE-CAPILLARY: 57 mg/dL — AB (ref 65–99)

## 2016-08-27 SURGERY — CARDIOVERSION
Anesthesia: Monitor Anesthesia Care

## 2016-08-27 MED ORDER — FENTANYL CITRATE (PF) 100 MCG/2ML IJ SOLN
INTRAMUSCULAR | Status: AC
  Filled 2016-08-27: qty 2

## 2016-08-27 MED ORDER — PROPOFOL 10 MG/ML IV BOLUS
INTRAVENOUS | Status: AC
  Filled 2016-08-27: qty 20

## 2016-08-27 MED ORDER — LIDOCAINE HCL (PF) 1 % IJ SOLN
INTRAMUSCULAR | Status: AC
  Filled 2016-08-27: qty 5

## 2016-08-27 MED ORDER — IPRATROPIUM-ALBUTEROL 0.5-2.5 (3) MG/3ML IN SOLN: 3.0000 mL | Freq: Four times a day (QID) | RESPIRATORY_TRACT | Status: DC

## 2016-08-27 MED ORDER — MIDAZOLAM HCL 2 MG/2ML IJ SOLN: 1.0000 mg | INTRAMUSCULAR | Status: AC

## 2016-08-27 MED ORDER — LACTATED RINGERS IV SOLN: INTRAVENOUS | Status: DC

## 2016-08-27 MED ORDER — MIDAZOLAM HCL 2 MG/2ML IJ SOLN
INTRAMUSCULAR | Status: AC
  Filled 2016-08-27: qty 2

## 2016-08-27 MED ORDER — ATROPINE SULFATE 1 MG/ML IJ SOLN
INTRAMUSCULAR | Status: AC
  Filled 2016-08-27: qty 1

## 2016-08-27 MED ORDER — IPRATROPIUM-ALBUTEROL 0.5-2.5 (3) MG/3ML IN SOLN
RESPIRATORY_TRACT | Status: AC
  Filled 2016-08-27: qty 3

## 2016-08-27 NOTE — OR Nursing (Signed)
Patient is in a regular cardiac rhythm  On monitor and EKG. Dr. Patsey Berthold notified. Dr. Bronson Ing notifed and cardioversion cancelled. Instruction to follow up in the office next week.  Patient discharged with an understanding of instructions. Daughter present.

## 2016-08-27 NOTE — H&P (View-Only) (Signed)
Electrophysiology Office Note   Date:  08/06/2016   ID:  Miranda, Sexton 1941/03/16, MRN [id number]  PCP:  Glenda Chroman, MD  Cardiologist:  Domenic Polite Primary Electrophysiologist: [name], MD    CC: afib   History of Present Illness: Miranda Sexton is a 76 y.o. female who presents today for electrophysiology evaluation.  She has done well since her last visit.   She still has some SOB and edema.  Today, she denies symptoms of palpitations, orthopnea, PND,presyncope, syncope, bleeding, or neurologic sequela. The patient is tolerating medications without difficulties and is otherwise without complaint today.    Past Medical History:  Diagnosis Date  . Arthritis    "right shoulder" (03/02/2016)  . Asthma   . Carotid artery occlusion   . Chronic bronchitis ([place])   . Chronic diastolic heart failure ([place])   . CKD (chronic kidney disease) stage 2, GFR 60-89 ml/min   . COPD (chronic obstructive pulmonary disease) ([place])   . Coronary atherosclerosis of native coronary artery    Nonobstructive 08/2008  . Daily headache   . DJD (degenerative joint disease)   . Essential hypertension, benign   . GERD (gastroesophageal reflux disease)   . HLD (hyperlipidemia)   . Obesity   . On home oxygen therapy    "2L when I get to wheezing" (03/02/2016)  . PAD (peripheral artery disease) ([facility])    Moderate right renal artery stenosis 08/2008  . Persistent atrial fibrillation ([place]) 05/12/2016   observed on EP follow-up visit.  started on eliquis  . Presence of permanent cardiac pacemaker   . Sinus bradycardia    First degree atrioventricular block  . Type 2 diabetes mellitus ([place])    Past Surgical History:  Procedure Laterality Date  . CARDIAC CATHETERIZATION  2014  . CATARACT EXTRACTION W/ INTRAOCULAR LENS  IMPLANT, BILATERAL Bilateral 2008  . ENDARTERECTOMY Right 12/08/2012   Procedure: ENDARTERECTOMY CAROTID;  Surgeon: [name], MD;  Location: [facility];  Service: Vascular;  Laterality:  Right;  . EP IMPLANTABLE DEVICE N/A 03/02/2016   Procedure: Pacemaker Implant;  Surgeon: [name], MD;  Location: [facility];  Service: Cardiovascular;  Laterality: N/A;  . INSERT / REPLACE / REMOVE PACEMAKER  03/02/2016     Current Outpatient Prescriptions  Medication Sig Dispense Refill  . amLODipine (NORVASC) 5 MG tablet Take 1 tablet (5 mg total) by mouth daily. 30 tablet 6  . apixaban (ELIQUIS) 5 MG TABS tablet Take 1 tablet (5 mg total) by mouth 2 (two) times daily. 56 tablet 0  . atorvastatin (LIPITOR) 40 MG tablet Take 1 tablet (40 mg total) by mouth daily. 30 tablet 1  . benazepril (LOTENSIN) 40 MG tablet Take 40 mg by mouth daily.    Marland Kitchen ELIQUIS 5 MG TABS tablet take 1 tablet by mouth twice a day 60 tablet 6  . furosemide (LASIX) 40 MG tablet take 1 tablet by mouth once daily 15 tablet 0  . glipiZIDE (GLUCOTROL XL) 10 MG 24 hr tablet Take 10 mg by mouth daily.    . insulin aspart protamine- aspart (NOVOLOG 70/30) (70-30) 100 UNIT/ML injection Inject 48-58 Units into the skin 2 (two) times daily with a meal. 58 units in AM and 48 units in PM    . metFORMIN (GLUCOPHAGE) 500 MG tablet Take 1,000 mg by mouth 2 (two) times daily.    Marland Kitchen olopatadine (PATANOL) 0.1 % ophthalmic solution Place 1 drop into both eyes daily.    Marland Kitchen  omeprazole (PRILOSEC) 20 MG capsule Take 20 mg by mouth daily.    . potassium chloride SA (K-DUR,KLOR-CON) 20 MEQ tablet Take 20 mEq by mouth daily.    Marland Kitchen triamterene-hydrochlorothiazide (DYAZIDE) 37.5-25 MG capsule Take 1/2 tablet by mouth daily    . venlafaxine XR (EFFEXOR-XR) 150 MG 24 hr capsule Take 150 mg by mouth daily.    . diphenoxylate-atropine (LOMOTIL) 2.5-0.025 MG tablet Take 1 tablet by mouth as needed for diarrhea or loose stools.     . meclizine (ANTIVERT) 25 MG tablet take 1/2 to 1 tablet by mouth three times a day if needed and 1 tablet by mouth at bedtime  0   No current facility-administered medications for this visit.     Allergies:    Codeine and Penicillins   Social History:  The patient  reports that she has never smoked. She has never used smokeless tobacco. She reports that she does not drink alcohol or use drugs.   Family History:  The patient's  family history includes Cancer in her mother; Deep vein thrombosis in her mother; Diabetes in her brother, mother, and sister; Heart disease in her mother and sister; Hyperlipidemia in her brother and sister; Hypertension in her brother, father, mother, and sister.    ROS:  Please see the history of present illness.   All other systems are reviewed and negative.    PHYSICAL EXAM: VS:  BP 137/69   Pulse 88   Ht 5\' 7"  (1.702 m)   Wt 242 lb (109.8 kg)   SpO2 90%   BMI 37.90 kg/m  , BMI Body mass index is 37.9 kg/m. GEN: Well nourished, well developed, in no acute distress  HEENT: normal  Neck: no JVD, carotid bruits, or masses Cardiac: RRR (paced)  Respiratory:  clear to auscultation bilaterally, normal work of breathing GI: soft, nontender, nondistended, + BS MS: no deformity or atrophy  Skin: warm and dry, pacemaker pocket is well healed Neuro:  Strength and sensation are intact Psych: euthymic mood, full affect  EKG:  EKG is ordered today. The ekg ordered today shows afib with V pacing (His bundle)    Lipid Panel     Component Value Date/Time   CHOL (H) 08/28/2008 0520    206        ATP III CLASSIFICATION:  <200     mg/dL   Desirable  200-239  mg/dL   Borderline High  >=240    mg/dL   High          TRIG 195 (H) 08/28/2008 0520   HDL 32 (L) 08/28/2008 0520   CHOLHDL 6.4 08/28/2008 0520   VLDL 39 08/28/2008 0520   LDLCALC (H) 08/28/2008 0520    135        Total Cholesterol/HDL:CHD Risk Coronary Heart Disease Risk Table                     Men   Women  1/2 Average Risk   3.4   3.3  Average Risk       5.0   4.4  2 X Average Risk   9.6   7.1  3 X Average Risk  23.4   11.0        Use the calculated Patient Ratio above and the CHD Risk Table to  determine the patient's CHD Risk.        ATP III CLASSIFICATION (LDL):  <100     mg/dL   Optimal  100-129  mg/dL  Near or Above                    Optimal  130-159  mg/dL   Borderline  160-189  mg/dL   High  >190     mg/dL   Very High     Wt Readings from Last 3 Encounters:  08/06/16 242 lb (109.8 kg)  05/12/16 240 lb (108.9 kg)  04/16/16 236 lb (107 kg)     ASSESSMENT AND PLAN:  1.  Sick sinus syndrome/ marked first degree AV block Normal pacemaker function Interestingly, today she is in afib with an escape V rate 40s.  She is s/p PPM with His bundle pacing.  Today, her threshold has risen significantly (now 2V @0 .4 msec).  Threshold is also 1.5V @1  msec.  I have therefore increased pacing output to 2.5V@1  msec.  No other changes today.  2. Persistent afib Persistent afib is noted.  She is on eliquis for chads2vasc score of 7.  She reports compliance without interruption. Therapeutic strategies for afib including rate control and rhythm were discussed in detail with the patient today.  She would like to pursue sinus rhythm.  I have therefore started on flecainide 50mg  BID today.  She will return on Monday for ekg.  Will determine if she should increase flecainide or pursue cardioversion at that time.  3.  Hypertensive cardiovascular disease On multiple antihypertensive medicinces ? RAS Would consider referral to Dr Gwenlyn Found for further evaluation (will defer to Drs McDowell/ Branch)  4. Diastolic dysfunction Avoid salt Weight reduction   Current medicines are reviewed at length with the patient today.   The patient does not have concerns regarding her medicines.  The following changes were made today:  none  Return to see me in 4 weeks for further evaluation of His pacing and afib management  Signed, [name], MD  08/06/2016 10:02 AM     [facility] [address] [place] [address] [phone] (office) [phone] (fax)

## 2016-08-27 NOTE — Anesthesia Preprocedure Evaluation (Deleted)
Anesthesia Evaluation  Patient identified by MRN, date of birth, ID band Patient awake    Reviewed: Allergy & Precautions, H&P , NPO status , Patient's Chart, lab work & pertinent test results  History of Anesthesia Complications Negative for: history of anesthetic complications  Airway Mallampati: II   Neck ROM: Full    Dental  (+) Teeth Intact   Pulmonary shortness of breath and with exertion, asthma , COPD,    Pulmonary exam normal        Cardiovascular hypertension, Pt. on medications + CAD, + Peripheral Vascular Disease and +CHF  + dysrhythmias Atrial Fibrillation + pacemaker  Rate:Normal     Neuro/Psych  Headaches, Anxiety negative neurological ROS  negative psych ROS   GI/Hepatic GERD  ,  Endo/Other  diabetes, Well Controlled, Type 1, Insulin Dependent, Oral Hypoglycemic Agents  Renal/GU Renal InsufficiencyRenal disease     Musculoskeletal negative musculoskeletal ROS (+)   Abdominal   Peds  Hematology negative hematology ROS (+)   Anesthesia Other Findings   Reproductive/Obstetrics negative OB ROS                             Anesthesia Physical Anesthesia Plan  ASA: IV  Anesthesia Plan: MAC   Post-op Pain Management:    Induction: Intravenous  Airway Management Planned: Simple Face Mask  Additional Equipment:   Intra-op Plan:   Post-operative Plan:   Informed Consent: I have reviewed the patients History and Physical, chart, labs and discussed the procedure including the risks, benefits and alternatives for the proposed anesthesia with the patient or authorized representative who has indicated his/her understanding and acceptance.     Plan Discussed with:   Anesthesia Plan Comments:         Anesthesia Quick Evaluation

## 2016-08-27 NOTE — Discharge Instructions (Signed)
FOLLOW UP WITH DR. ALLRED NEXT WEEK. CALL OFFICE FOR AN APPOINTMENT.

## 2016-08-27 NOTE — Interval H&P Note (Signed)
History and Physical Interval Note: No changes. Will proceed with cardioversion for atrial fibrillation as planned.  08/27/2016 7:53 AM  Miranda Sexton  has presented today for surgery, with the diagnosis of a-fib  The various methods of treatment have been discussed with the patient and family. After consideration of risks, benefits and other options for treatment, the patient has consented to  Procedure(s): CARDIOVERSION (N/A) as a surgical intervention .  The patient's history has been reviewed, patient examined, no change in status, stable for surgery.  I have reviewed the patient's chart and labs.  Questions were answered to the patient's satisfaction.     Miranda Sexton

## 2016-09-05 DIAGNOSIS — R69 Illness, unspecified: Secondary | ICD-10-CM | POA: Diagnosis not present

## 2016-09-06 DIAGNOSIS — I4891 Unspecified atrial fibrillation: Secondary | ICD-10-CM | POA: Diagnosis not present

## 2016-09-06 DIAGNOSIS — Z950 Presence of cardiac pacemaker: Secondary | ICD-10-CM | POA: Diagnosis not present

## 2016-09-06 DIAGNOSIS — Z794 Long term (current) use of insulin: Secondary | ICD-10-CM | POA: Diagnosis not present

## 2016-09-06 DIAGNOSIS — E785 Hyperlipidemia, unspecified: Secondary | ICD-10-CM | POA: Diagnosis not present

## 2016-09-06 DIAGNOSIS — E119 Type 2 diabetes mellitus without complications: Secondary | ICD-10-CM | POA: Diagnosis not present

## 2016-09-06 DIAGNOSIS — E669 Obesity, unspecified: Secondary | ICD-10-CM | POA: Diagnosis not present

## 2016-09-06 DIAGNOSIS — R69 Illness, unspecified: Secondary | ICD-10-CM | POA: Diagnosis not present

## 2016-09-06 DIAGNOSIS — H8110 Benign paroxysmal vertigo, unspecified ear: Secondary | ICD-10-CM | POA: Diagnosis not present

## 2016-09-06 DIAGNOSIS — Z6839 Body mass index (BMI) 39.0-39.9, adult: Secondary | ICD-10-CM | POA: Diagnosis not present

## 2016-09-06 DIAGNOSIS — I10 Essential (primary) hypertension: Secondary | ICD-10-CM | POA: Diagnosis not present

## 2016-09-06 DIAGNOSIS — Z7902 Long term (current) use of antithrombotics/antiplatelets: Secondary | ICD-10-CM | POA: Diagnosis not present

## 2016-09-06 DIAGNOSIS — Z0000 Encounter for general adult medical examination without abnormal findings: Secondary | ICD-10-CM | POA: Diagnosis not present

## 2016-09-06 DIAGNOSIS — Z961 Presence of intraocular lens: Secondary | ICD-10-CM | POA: Diagnosis not present

## 2016-09-06 DIAGNOSIS — E876 Hypokalemia: Secondary | ICD-10-CM | POA: Diagnosis not present

## 2016-09-06 DIAGNOSIS — Z9849 Cataract extraction status, unspecified eye: Secondary | ICD-10-CM | POA: Diagnosis not present

## 2016-09-06 DIAGNOSIS — K219 Gastro-esophageal reflux disease without esophagitis: Secondary | ICD-10-CM | POA: Diagnosis not present

## 2016-09-07 DIAGNOSIS — K219 Gastro-esophageal reflux disease without esophagitis: Secondary | ICD-10-CM | POA: Diagnosis not present

## 2016-09-07 DIAGNOSIS — E1159 Type 2 diabetes mellitus with other circulatory complications: Secondary | ICD-10-CM | POA: Diagnosis not present

## 2016-09-07 DIAGNOSIS — Z6841 Body Mass Index (BMI) 40.0 and over, adult: Secondary | ICD-10-CM | POA: Diagnosis not present

## 2016-09-07 DIAGNOSIS — E7800 Pure hypercholesterolemia, unspecified: Secondary | ICD-10-CM | POA: Diagnosis not present

## 2016-09-07 DIAGNOSIS — I10 Essential (primary) hypertension: Secondary | ICD-10-CM | POA: Diagnosis not present

## 2016-09-07 DIAGNOSIS — E1165 Type 2 diabetes mellitus with hyperglycemia: Secondary | ICD-10-CM | POA: Diagnosis not present

## 2016-09-07 DIAGNOSIS — I509 Heart failure, unspecified: Secondary | ICD-10-CM | POA: Diagnosis not present

## 2016-09-07 DIAGNOSIS — Z299 Encounter for prophylactic measures, unspecified: Secondary | ICD-10-CM | POA: Diagnosis not present

## 2016-09-07 DIAGNOSIS — Z789 Other specified health status: Secondary | ICD-10-CM | POA: Diagnosis not present

## 2016-09-07 DIAGNOSIS — J449 Chronic obstructive pulmonary disease, unspecified: Secondary | ICD-10-CM | POA: Diagnosis not present

## 2016-09-10 ENCOUNTER — Ambulatory Visit (INDEPENDENT_AMBULATORY_CARE_PROVIDER_SITE_OTHER): Payer: Medicare HMO | Admitting: Internal Medicine

## 2016-09-10 ENCOUNTER — Encounter: Payer: Self-pay | Admitting: Internal Medicine

## 2016-09-10 VITALS — BP 163/68 | HR 71 | Ht 67.0 in | Wt 238.6 lb

## 2016-09-10 DIAGNOSIS — I4891 Unspecified atrial fibrillation: Secondary | ICD-10-CM

## 2016-09-10 DIAGNOSIS — I4819 Other persistent atrial fibrillation: Secondary | ICD-10-CM

## 2016-09-10 DIAGNOSIS — I481 Persistent atrial fibrillation: Secondary | ICD-10-CM

## 2016-09-10 DIAGNOSIS — I5032 Chronic diastolic (congestive) heart failure: Secondary | ICD-10-CM | POA: Diagnosis not present

## 2016-09-10 DIAGNOSIS — I495 Sick sinus syndrome: Secondary | ICD-10-CM | POA: Diagnosis not present

## 2016-09-10 DIAGNOSIS — I119 Hypertensive heart disease without heart failure: Secondary | ICD-10-CM | POA: Diagnosis not present

## 2016-09-10 NOTE — Patient Instructions (Addendum)
Medication Instructions:  Continue all current medications.  Labwork: none  Testing/Procedures: none  Follow-Up:  Your physician wants you to follow up in: 6 months.  You will receive a reminder letter in the mail one-two months in advance.  If you don't receive a letter, please call our office to schedule the follow up appointment - Dr. Rayann Heman.  3 months - Dr. Domenic Polite   Any Other Special Instructions Will Be Listed Below (If Applicable). Remote monitoring is used to monitor your Pacemaker of ICD from home. This monitoring reduces the number of office visits required to check your device to one time per year. It allows [place] to keep an eye on the functioning of your device to ensure it is working properly. You are scheduled for a device check from home on 12/13/2016. You may send your transmission at any time that day. If you have a wireless device, the transmission will be sent automatically. After your physician reviews your transmission, you will receive a postcard with your next transmission date.  If you need a refill on your cardiac medications before your next appointment, please call your pharmacy.

## 2016-09-10 NOTE — Progress Notes (Signed)
Electrophysiology Office Note   Date:  09/10/2016   ID:  LENDORA KEYS, DOB [birth year], MRN [id number]  PCP:  Glenda Chroman, MD  Cardiologist:  Domenic Polite Primary Electrophysiologist: [name], MD    CC: afib   History of Present Illness: TIAH HECKEL is a 76 y.o. female who presents today for electrophysiology evaluation.  She has done well since her last visit.  She reports feeling "much better" with sinus rhythm.  She reports less chest heaviness and some improvement in exercise tolerance.  Today, she denies symptoms of palpitations, orthopnea, PND,presyncope, syncope, bleeding, or neurologic sequela. The patient is tolerating medications without difficulties and is otherwise without complaint today.    Past Medical History:  Diagnosis Date  . Anxiety   . Arthritis    "right shoulder" (03/02/2016)  . Asthma   . Carotid artery occlusion   . CHF (congestive heart failure) ([place])   . Chronic bronchitis ([place])   . Chronic diastolic heart failure ([place])   . CKD (chronic kidney disease) stage 2, GFR 60-89 ml/min   . COPD (chronic obstructive pulmonary disease) ([place])   . Coronary atherosclerosis of native coronary artery    Nonobstructive 08/2008  . Daily headache   . DJD (degenerative joint disease)   . Dysrhythmia    AFib  . Essential hypertension, benign   . GERD (gastroesophageal reflux disease)   . HLD (hyperlipidemia)   . Obesity   . On home oxygen therapy    "2L when I get to wheezing" (03/02/2016)  . PAD (peripheral artery disease) (HCC)    Moderate right renal artery stenosis 08/2008  . Persistent atrial fibrillation ([place]) 05/12/2016   observed on EP follow-up visit.  started on eliquis  . Presence of permanent cardiac pacemaker   . Sinus bradycardia    First degree atrioventricular block  . Type 2 diabetes mellitus ([place])    Past Surgical History:  Procedure Laterality Date  . CARDIAC CATHETERIZATION  2014  . CATARACT EXTRACTION W/ INTRAOCULAR LENS  IMPLANT,  BILATERAL Bilateral 2008  . ENDARTERECTOMY Right 12/08/2012   Procedure: ENDARTERECTOMY CAROTID;  Surgeon: [name], MD;  Location: [facility];  Service: Vascular;  Laterality: Right;  . EP IMPLANTABLE DEVICE N/A 03/02/2016   Procedure: Pacemaker Implant;  Surgeon: [name], MD;  Location: [facility];  Service: Cardiovascular;  Laterality: N/A;  . INSERT / REPLACE / REMOVE PACEMAKER  03/02/2016     Current Outpatient Prescriptions  Medication Sig Dispense Refill  . albuterol (PROAIR HFA) 108 (90 Base) MCG/ACT inhaler Inhale 1-2 puffs into the lungs every 6 (six) hours as needed for wheezing or shortness of breath.    Marland Kitchen amLODipine (NORVASC) 5 MG tablet Take 1 tablet (5 mg total) by mouth daily. 30 tablet 6  . apixaban (ELIQUIS) 5 MG TABS tablet Take 1 tablet (5 mg total) by mouth 2 (two) times daily. 56 tablet 0  . atorvastatin (LIPITOR) 40 MG tablet Take 1 tablet (40 mg total) by mouth daily. 30 tablet 1  . benazepril (LOTENSIN) 40 MG tablet Take 40 mg by mouth daily.    . diphenoxylate-atropine (LOMOTIL) 2.5-0.025 MG tablet Take 1 tablet by mouth as needed for diarrhea or loose stools.     . flecainide (TAMBOCOR) 50 MG tablet Take 2 tablets (100 mg total) by mouth 2 (two) times daily.    . furosemide (LASIX) 40 MG tablet take 1 tablet by mouth once daily 15 tablet 0  . glipiZIDE (GLUCOTROL XL)  10 MG 24 hr tablet Take 10 mg by mouth daily.    . insulin aspart protamine- aspart (NOVOLOG 70/30) (70-30) 100 UNIT/ML injection Inject 48-58 Units into the skin 2 (two) times daily with a meal. 58 units in AM and 48 units in PM    . meclizine (ANTIVERT) 25 MG tablet take 1/2 to 1 tablet by mouth three times a day if needed and 1 tablet by mouth at bedtime  0  . metFORMIN (GLUCOPHAGE) 500 MG tablet Take 1,000 mg by mouth 2 (two) times daily.    Marland Kitchen olopatadine (PATANOL) 0.1 % ophthalmic solution Place 1 drop into both eyes daily as needed for allergies.     Marland Kitchen omeprazole (PRILOSEC) 20 MG capsule  Take 20 mg by mouth daily.    . potassium chloride SA (K-DUR,KLOR-CON) 20 MEQ tablet Take 20 mEq by mouth daily.    Marland Kitchen triamterene-hydrochlorothiazide (DYAZIDE) 37.5-25 MG capsule Take 1/2 tablet by mouth daily    . triamterene-hydrochlorothiazide (MAXZIDE-25) 37.5-25 MG tablet Take 0.5 tablets by mouth daily.    Marland Kitchen venlafaxine XR (EFFEXOR-XR) 150 MG 24 hr capsule Take 150 mg by mouth daily.     No current facility-administered medications for this visit.     Allergies:   Codeine and Penicillins   Social History:  The patient  reports that she has never smoked. She has never used smokeless tobacco. She reports that she does not drink alcohol or use drugs.   Family History:  The patient's  family history includes Cancer in her mother; Deep vein thrombosis in her mother; Diabetes in her brother, mother, and sister; Heart disease in her mother and sister; Hyperlipidemia in her brother and sister; Hypertension in her brother, father, mother, and sister.    ROS:  Please see the history of present illness.   All other systems are reviewed and negative.    PHYSICAL EXAM: VS:  BP (!) 163/68   Pulse 71   Ht 5\' 7"  (1.702 m)   Wt 238 lb 9.6 oz (108.2 kg)   SpO2 95%   BMI 37.37 kg/m  , BMI Body mass index is 37.37 kg/m. GEN: Well nourished, well developed, in no acute distress  HEENT: normal  Neck: no JVD, carotid bruits, or masses Cardiac: RRR (paced)  Respiratory:  clear to auscultation bilaterally, normal work of breathing GI: soft, nontender, nondistended, + BS MS: no deformity or atrophy  Skin: warm and dry, pacemaker pocket is well healed Neuro:  Strength and sensation are intact Psych: euthymic mood, full affect  EKG:  EKG is ordered today. The ekg ordered today shows A and V pacing (His bundle)    Lipid Panel     Component Value Date/Time   CHOL (H) 08/28/2008 0520    206        ATP III CLASSIFICATION:  <200     mg/dL   Desirable  200-239  mg/dL   Borderline High  >=240     mg/dL   High          TRIG 195 (H) 08/28/2008 0520   HDL 32 (L) 08/28/2008 0520   CHOLHDL 6.4 08/28/2008 0520   VLDL 39 08/28/2008 0520   LDLCALC (H) 08/28/2008 0520    135        Total Cholesterol/HDL:CHD Risk Coronary Heart Disease Risk Table                     Men   Women  1/2 Average Risk  3.4   3.3  Average Risk       5.0   4.4  2 X Average Risk   9.6   7.1  3 X Average Risk  23.4   11.0        Use the calculated Patient Ratio above and the CHD Risk Table to determine the patient's CHD Risk.        ATP III CLASSIFICATION (LDL):  <100     mg/dL   Optimal  100-129  mg/dL   Near or Above                    Optimal  130-159  mg/dL   Borderline  160-189  mg/dL   High  >190     mg/dL   Very High     Wt Readings from Last 3 Encounters:  09/10/16 238 lb 9.6 oz (108.2 kg)  08/24/16 245 lb (111.1 kg)  08/06/16 242 lb (109.8 kg)     ASSESSMENT AND PLAN:  1.  Sick sinus syndrome/ marked first degree AV block Normal pacemaker function She is s/p PPM with His bundle pacing.   Threshold is stable today at 1.5V @1  msec.  No changes today carelink  2. Persistent afib Now in sinus with flecainide 100mg  BID Continue eliquis  3.  Hypertensive cardiovascular disease On multiple antihypertensive medicinces ? RAS Would consider referral to Dr Gwenlyn Found for further evaluation (will defer to Drs McDowell/ Branch)  4. Diastolic dysfunction Avoid salt Weight reduction   Current medicines are reviewed at length with the patient today.   The patient does not have concerns regarding her medicines.  The following changes were made today:  none  Follow-up with Dr Domenic Polite in 3 months I will see again in 6 months unless problems arise Carelink  Signed, [name], MD  09/10/2016 10:05 AM     [address] [place] [address] [phone] (office) [phone] (fax)

## 2016-09-14 LAB — CUP PACEART INCLINIC DEVICE CHECK
Brady Statistic AP VP Percent: 59.3 %
Brady Statistic AP VS Percent: 0.1 % — CL
Brady Statistic AS VP Percent: 36.2 %
Brady Statistic AS VS Percent: 4.5 %
Implantable Lead Implant Date: 20170829
Implantable Lead Location: 753859
Lead Channel Impedance Value: 456 Ohm
Lead Channel Impedance Value: 494 Ohm
Lead Channel Pacing Threshold Amplitude: 0.75 V
Lead Channel Pacing Threshold Pulse Width: 0.4 ms
Lead Channel Sensing Intrinsic Amplitude: 2 mV
Lead Channel Sensing Intrinsic Amplitude: 5 mV
Lead Channel Setting Pacing Amplitude: 2 V
MDC IDC LEAD IMPLANT DT: 20170829
MDC IDC LEAD LOCATION: 753860
MDC IDC MSMT BATTERY REMAINING LONGEVITY: 90 mo
MDC IDC MSMT BATTERY VOLTAGE: 3.01 V
MDC IDC MSMT LEADCHNL RV PACING THRESHOLD AMPLITUDE: 1.5 V
MDC IDC MSMT LEADCHNL RV PACING THRESHOLD PULSEWIDTH: 1 ms
MDC IDC PG IMPLANT DT: 20170829
MDC IDC SESS DTM: 20180313145515
MDC IDC SET LEADCHNL RV PACING AMPLITUDE: 2.5 V
MDC IDC SET LEADCHNL RV PACING PULSEWIDTH: 1 ms
MDC IDC SET LEADCHNL RV SENSING SENSITIVITY: 0.9 mV

## 2016-10-27 DIAGNOSIS — E119 Type 2 diabetes mellitus without complications: Secondary | ICD-10-CM | POA: Diagnosis not present

## 2016-10-27 DIAGNOSIS — J449 Chronic obstructive pulmonary disease, unspecified: Secondary | ICD-10-CM | POA: Diagnosis not present

## 2016-10-27 DIAGNOSIS — I509 Heart failure, unspecified: Secondary | ICD-10-CM | POA: Diagnosis not present

## 2016-11-11 DIAGNOSIS — E7800 Pure hypercholesterolemia, unspecified: Secondary | ICD-10-CM | POA: Diagnosis not present

## 2016-11-11 DIAGNOSIS — E1165 Type 2 diabetes mellitus with hyperglycemia: Secondary | ICD-10-CM | POA: Diagnosis not present

## 2016-11-11 DIAGNOSIS — Z6841 Body Mass Index (BMI) 40.0 and over, adult: Secondary | ICD-10-CM | POA: Diagnosis not present

## 2016-11-11 DIAGNOSIS — I5030 Unspecified diastolic (congestive) heart failure: Secondary | ICD-10-CM | POA: Diagnosis not present

## 2016-11-11 DIAGNOSIS — Z299 Encounter for prophylactic measures, unspecified: Secondary | ICD-10-CM | POA: Diagnosis not present

## 2016-11-11 DIAGNOSIS — J069 Acute upper respiratory infection, unspecified: Secondary | ICD-10-CM | POA: Diagnosis not present

## 2016-11-25 DIAGNOSIS — Z6839 Body mass index (BMI) 39.0-39.9, adult: Secondary | ICD-10-CM | POA: Diagnosis not present

## 2016-11-25 DIAGNOSIS — R319 Hematuria, unspecified: Secondary | ICD-10-CM | POA: Diagnosis not present

## 2016-11-25 DIAGNOSIS — Z299 Encounter for prophylactic measures, unspecified: Secondary | ICD-10-CM | POA: Diagnosis not present

## 2016-11-25 DIAGNOSIS — I509 Heart failure, unspecified: Secondary | ICD-10-CM | POA: Diagnosis not present

## 2016-11-25 DIAGNOSIS — N390 Urinary tract infection, site not specified: Secondary | ICD-10-CM | POA: Diagnosis not present

## 2016-11-25 DIAGNOSIS — J449 Chronic obstructive pulmonary disease, unspecified: Secondary | ICD-10-CM | POA: Diagnosis not present

## 2016-11-26 DIAGNOSIS — I509 Heart failure, unspecified: Secondary | ICD-10-CM | POA: Diagnosis not present

## 2016-11-26 DIAGNOSIS — E119 Type 2 diabetes mellitus without complications: Secondary | ICD-10-CM | POA: Diagnosis not present

## 2016-11-26 DIAGNOSIS — J449 Chronic obstructive pulmonary disease, unspecified: Secondary | ICD-10-CM | POA: Diagnosis not present

## 2016-12-03 DIAGNOSIS — R319 Hematuria, unspecified: Secondary | ICD-10-CM | POA: Diagnosis not present

## 2016-12-03 DIAGNOSIS — Z6839 Body mass index (BMI) 39.0-39.9, adult: Secondary | ICD-10-CM | POA: Diagnosis not present

## 2016-12-03 DIAGNOSIS — I10 Essential (primary) hypertension: Secondary | ICD-10-CM | POA: Diagnosis not present

## 2016-12-03 DIAGNOSIS — E1165 Type 2 diabetes mellitus with hyperglycemia: Secondary | ICD-10-CM | POA: Diagnosis not present

## 2016-12-03 DIAGNOSIS — Z299 Encounter for prophylactic measures, unspecified: Secondary | ICD-10-CM | POA: Diagnosis not present

## 2016-12-07 DIAGNOSIS — R69 Illness, unspecified: Secondary | ICD-10-CM | POA: Diagnosis not present

## 2016-12-13 ENCOUNTER — Encounter: Payer: Medicare HMO | Admitting: *Deleted

## 2016-12-13 ENCOUNTER — Encounter: Payer: Self-pay | Admitting: Cardiology

## 2016-12-13 ENCOUNTER — Telehealth: Payer: Self-pay | Admitting: Cardiology

## 2016-12-13 NOTE — Progress Notes (Signed)
Cardiology Office Note  Date: 12/15/2016   ID: Miranda Sexton, DOB [birth year], MRN [id number]  PCP: Glenda Chroman, MD  Primary Cardiologist: Rozann Lesches, MD   Chief Complaint  Patient presents with  . PAF    History of Present Illness: Miranda Sexton is a 76 y.o. female that I have not seen in the office since June 2015. I reviewed interval records and office follow-up by other providers, most recently Dr. Rayann Heman. She presents today for a routine follow-up visit. States that she has been feeling well, no progressive shortness of breath or palpitations.  She follows in the device clinic with Dr. Rayann Heman, history of Medtronic dual-chamber pacemaker with His bundle pacing for sick sinus syndrome. Last device interrogation in March demonstrated 38% AT/AF burden. She is on Eliquis for stroke prophylaxis. She does not report any bleeding episodes.  She continues to follow with Dr. Woody Seller. I reviewed her lab work from February.  Past Medical History:  Diagnosis Date  . Anxiety   . Asthma   . Carotid artery occlusion   . Chronic bronchitis ([place])   . Chronic diastolic heart failure ([place])   . CKD (chronic kidney disease) stage 2, GFR 60-89 ml/min   . COPD (chronic obstructive pulmonary disease) ([place])   . Coronary atherosclerosis of native coronary artery    Nonobstructive 08/2008  . Daily headache   . DJD (degenerative joint disease)   . Essential hypertension   . GERD (gastroesophageal reflux disease)   . Hyperlipidemia   . Obesity   . On home oxygen therapy   . PAD (peripheral artery disease) (HCC)    Moderate right renal artery stenosis 08/2008  . Paroxysmal atrial fibrillation (HCC)   . Sick sinus syndrome (HCC)    Medtronic dual-chamber his bundle pacemaker - Dr. Rayann Heman  . Type 2 diabetes mellitus ([place])     Past Surgical History:  Procedure Laterality Date  . CARDIAC CATHETERIZATION  2014  . CATARACT EXTRACTION W/ INTRAOCULAR LENS  IMPLANT, BILATERAL Bilateral 2008    . ENDARTERECTOMY Right 12/08/2012   Procedure: ENDARTERECTOMY CAROTID;  Surgeon: [name], MD;  Location: [facility];  Service: Vascular;  Laterality: Right;  . EP IMPLANTABLE DEVICE N/A 03/02/2016   Procedure: Pacemaker Implant;  Surgeon: [name], MD;  Location: [facility];  Service: Cardiovascular;  Laterality: N/A;  . INSERT / REPLACE / REMOVE PACEMAKER  03/02/2016    Current Outpatient Prescriptions  Medication Sig Dispense Refill  . albuterol (PROAIR HFA) 108 (90 Base) MCG/ACT inhaler Inhale 1-2 puffs into the lungs every 6 (six) hours as needed for wheezing or shortness of breath.    Marland Kitchen amLODipine (NORVASC) 5 MG tablet Take 1 tablet (5 mg total) by mouth daily. 30 tablet 6  . apixaban (ELIQUIS) 5 MG TABS tablet Take 1 tablet (5 mg total) by mouth 2 (two) times daily. 56 tablet 0  . atorvastatin (LIPITOR) 40 MG tablet Take 1 tablet (40 mg total) by mouth daily. 30 tablet 1  . benazepril (LOTENSIN) 40 MG tablet Take 40 mg by mouth daily.    . diphenoxylate-atropine (LOMOTIL) 2.5-0.025 MG tablet Take 1 tablet by mouth as needed for diarrhea or loose stools.     . flecainide (TAMBOCOR) 50 MG tablet Take 2 tablets (100 mg total) by mouth 2 (two) times daily.    . furosemide (LASIX) 40 MG tablet take 1 tablet by mouth once daily 15 tablet 0  . glipiZIDE (GLUCOTROL XL) 10 MG 24  hr tablet Take 10 mg by mouth daily.    . insulin aspart protamine- aspart (NOVOLOG 70/30) (70-30) 100 UNIT/ML injection Inject 48-58 Units into the skin 2 (two) times daily with a meal. 58 units in AM and 48 units in PM    . meclizine (ANTIVERT) 25 MG tablet take 1/2 to 1 tablet by mouth three times a day if needed and 1 tablet by mouth at bedtime  0  . metFORMIN (GLUCOPHAGE) 500 MG tablet Take 1,000 mg by mouth 2 (two) times daily.    Marland Kitchen olopatadine (PATANOL) 0.1 % ophthalmic solution Place 1 drop into both eyes daily as needed for allergies.     Marland Kitchen omeprazole (PRILOSEC) 20 MG capsule Take 20 mg by mouth daily.     . potassium chloride SA (K-DUR,KLOR-CON) 20 MEQ tablet Take 20 mEq by mouth daily.    Marland Kitchen triamterene-hydrochlorothiazide (DYAZIDE) 37.5-25 MG capsule Take 1/2 tablet by mouth daily    . triamterene-hydrochlorothiazide (MAXZIDE-25) 37.5-25 MG tablet Take 0.5 tablets by mouth daily.    Marland Kitchen venlafaxine XR (EFFEXOR-XR) 150 MG 24 hr capsule Take 150 mg by mouth daily.     No current facility-administered medications for this visit.    Allergies:  Codeine and Penicillins   Social History: The patient  reports that she has never smoked. She has never used smokeless tobacco. She reports that she does not drink alcohol or use drugs.   ROS:  Please see the history of present illness. Otherwise, complete review of systems is positive for none.  All other systems are reviewed and negative.   Physical Exam: VS:  BP (!) 148/72   Pulse 72   Ht 5\' 5"  (1.651 m)   Wt 238 lb (108 kg)   SpO2 95%   BMI 39.61 kg/m , BMI Body mass index is 39.61 kg/m.  Wt Readings from Last 3 Encounters:  12/15/16 238 lb (108 kg)  09/10/16 238 lb 9.6 oz (108.2 kg)  08/24/16 245 lb (111.1 kg)    Overweight woman, appears comfortable at rest.  HEENT: Conjunctiva and lids normal, oropharynx clear.  Neck: Supple, no elevated JVP, s/p right CEA, no thyromegaly.  Lungs: Clear to auscultation, nonlabored breathing at rest.  Cardiac: Regular rate and rhythm, no S3 or significant systolic murmur, no pericardial rub.  Abdomen: Soft, nontender, bowel sounds present.  Extremities: No pitting edema, distal pulses 2+.  Skin: Warm and dry.  Musculoskeletal: No kyphosis.  Neuropsychiatric: Alert and oriented x3, affect grossly appropriate.  ECG: I personally reviewed the tracing from 09/10/2016 which showed AV pacing with His bundle pacing.  Recent Labwork: 08/24/2016: ALT 25; AST 28; BUN 16; Creatinine, Ser 1.06; Hemoglobin 11.0; Platelets 305; Potassium 4.2; Sodium 140   Other Studies Reviewed Today:  Carotid Dopplers  04/16/2016: Patent right CEA site, 32-67% LICA stenosis.  Echocardiogram 03/02/2016: Study Conclusions  - Left ventricle: The cavity size was normal. Wall thickness was   increased in a pattern of mild LVH. Systolic function was normal.   The estimated ejection fraction was in the range of 55% to 60%.   Wall motion was normal; there were no regional wall motion   abnormalities. - Aortic valve: Valve area (VTI): 1.37 cm^2. Valve area (Vmax):   1.32 cm^2. Valve area (Vmean): 1.5 cm^2. - Mitral valve: There was mild regurgitation. - Right atrium: The atrium was mildly dilated.  Assessment and Plan:  1. Paroxysmal atrial fibrillation, symptomatically stable on current regimen including flecainide and Eliquis. I reviewed her most recent  ECG and lab work as outlined above.  2. Sick sinus syndrome status post Medtronic dual chamber His bundle pacer, follows with Dr. Rayann Heman.  3. Carotid artery disease, continues to follow with [facility]. Carotid Dopplers from October 2017 are noted above.  4. Chronic diastolic heart failure, LVEF 55-60% by echocardiogram last year. Weight is stable, she does not report any progressive shortness of breath.  Current medicines were reviewed with the patient today.  Disposition: Follow-up in 6 months.  Signed, Satira Sark, MD, [facility] 12/15/2016 10:30 AM    [facility] [address], [place], [place] [address] Phone: [phone]; Fax: [phone]

## 2016-12-13 NOTE — Telephone Encounter (Signed)
Spoke with pt and reminded pt of remote transmission that is due today. Pt verbalized understanding.   

## 2016-12-14 DIAGNOSIS — I350 Nonrheumatic aortic (valve) stenosis: Secondary | ICD-10-CM | POA: Diagnosis not present

## 2016-12-14 DIAGNOSIS — Z6839 Body mass index (BMI) 39.0-39.9, adult: Secondary | ICD-10-CM | POA: Diagnosis not present

## 2016-12-14 DIAGNOSIS — Z299 Encounter for prophylactic measures, unspecified: Secondary | ICD-10-CM | POA: Diagnosis not present

## 2016-12-14 DIAGNOSIS — I5030 Unspecified diastolic (congestive) heart failure: Secondary | ICD-10-CM | POA: Diagnosis not present

## 2016-12-14 DIAGNOSIS — I10 Essential (primary) hypertension: Secondary | ICD-10-CM | POA: Diagnosis not present

## 2016-12-14 DIAGNOSIS — J45909 Unspecified asthma, uncomplicated: Secondary | ICD-10-CM | POA: Diagnosis not present

## 2016-12-14 DIAGNOSIS — K219 Gastro-esophageal reflux disease without esophagitis: Secondary | ICD-10-CM | POA: Diagnosis not present

## 2016-12-14 DIAGNOSIS — J449 Chronic obstructive pulmonary disease, unspecified: Secondary | ICD-10-CM | POA: Diagnosis not present

## 2016-12-14 DIAGNOSIS — E1165 Type 2 diabetes mellitus with hyperglycemia: Secondary | ICD-10-CM | POA: Diagnosis not present

## 2016-12-15 ENCOUNTER — Ambulatory Visit (INDEPENDENT_AMBULATORY_CARE_PROVIDER_SITE_OTHER): Payer: Medicare HMO | Admitting: Cardiology

## 2016-12-15 ENCOUNTER — Encounter: Payer: Self-pay | Admitting: Cardiology

## 2016-12-15 VITALS — BP 148/72 | HR 72 | Ht 65.0 in | Wt 238.0 lb

## 2016-12-15 DIAGNOSIS — I495 Sick sinus syndrome: Secondary | ICD-10-CM

## 2016-12-15 DIAGNOSIS — I739 Peripheral vascular disease, unspecified: Secondary | ICD-10-CM

## 2016-12-15 DIAGNOSIS — I5032 Chronic diastolic (congestive) heart failure: Secondary | ICD-10-CM | POA: Diagnosis not present

## 2016-12-15 DIAGNOSIS — I779 Disorder of arteries and arterioles, unspecified: Secondary | ICD-10-CM

## 2016-12-15 DIAGNOSIS — I480 Paroxysmal atrial fibrillation: Secondary | ICD-10-CM

## 2016-12-15 NOTE — Patient Instructions (Signed)

## 2016-12-17 ENCOUNTER — Encounter: Payer: Self-pay | Admitting: Cardiology

## 2016-12-24 ENCOUNTER — Ambulatory Visit (INDEPENDENT_AMBULATORY_CARE_PROVIDER_SITE_OTHER): Payer: Medicare HMO | Admitting: *Deleted

## 2016-12-24 DIAGNOSIS — I495 Sick sinus syndrome: Secondary | ICD-10-CM | POA: Diagnosis not present

## 2016-12-27 NOTE — Progress Notes (Signed)
Remote pacemaker transmission.   

## 2016-12-28 LAB — CUP PACEART REMOTE DEVICE CHECK
Brady Statistic AP VP Percent: 98.49 %
Brady Statistic AP VS Percent: 0.04 %
Brady Statistic AS VP Percent: 1.46 %
Brady Statistic AS VS Percent: 0.01 %
Date Time Interrogation Session: 20180622135746
Implantable Lead Location: 753860
Implantable Pulse Generator Implant Date: 20170829
Lead Channel Impedance Value: 266 Ohm
Lead Channel Impedance Value: 342 Ohm
Lead Channel Impedance Value: 437 Ohm
Lead Channel Pacing Threshold Amplitude: 2.5 V
Lead Channel Pacing Threshold Pulse Width: 0.4 ms
Lead Channel Sensing Intrinsic Amplitude: 1.375 mV
Lead Channel Sensing Intrinsic Amplitude: 5.875 mV
Lead Channel Sensing Intrinsic Amplitude: 5.875 mV
Lead Channel Setting Pacing Amplitude: 2 V
Lead Channel Setting Pacing Amplitude: 2.5 V
MDC IDC LEAD IMPLANT DT: 20170829
MDC IDC LEAD IMPLANT DT: 20170829
MDC IDC LEAD LOCATION: 753859
MDC IDC MSMT BATTERY REMAINING LONGEVITY: 85 mo
MDC IDC MSMT BATTERY VOLTAGE: 3 V
MDC IDC MSMT LEADCHNL RA PACING THRESHOLD AMPLITUDE: 0.625 V
MDC IDC MSMT LEADCHNL RA PACING THRESHOLD PULSEWIDTH: 0.4 ms
MDC IDC MSMT LEADCHNL RA SENSING INTR AMPL: 1.375 mV
MDC IDC MSMT LEADCHNL RV IMPEDANCE VALUE: 456 Ohm
MDC IDC SET LEADCHNL RV PACING PULSEWIDTH: 1 ms
MDC IDC SET LEADCHNL RV SENSING SENSITIVITY: 0.9 mV
MDC IDC STAT BRADY RA PERCENT PACED: 84.32 %
MDC IDC STAT BRADY RV PERCENT PACED: 99.51 %

## 2016-12-29 ENCOUNTER — Encounter: Payer: Self-pay | Admitting: Cardiology

## 2017-01-12 ENCOUNTER — Encounter: Payer: Self-pay | Admitting: Cardiology

## 2017-01-14 ENCOUNTER — Other Ambulatory Visit (HOSPITAL_COMMUNITY)
Admission: RE | Admit: 2017-01-14 | Discharge: 2017-01-14 | Disposition: A | Discharge status: Home / Self Care | Payer: Medicare HMO | Source: Other Acute Inpatient Hospital | Attending: Urology | Admitting: Urology

## 2017-01-14 ENCOUNTER — Ambulatory Visit (INDEPENDENT_AMBULATORY_CARE_PROVIDER_SITE_OTHER): Payer: Medicare HMO | Admitting: Urology

## 2017-01-14 DIAGNOSIS — N393 Stress incontinence (female) (male): Secondary | ICD-10-CM | POA: Diagnosis not present

## 2017-01-14 DIAGNOSIS — R310 Gross hematuria: Secondary | ICD-10-CM

## 2017-01-14 DIAGNOSIS — N952 Postmenopausal atrophic vaginitis: Secondary | ICD-10-CM | POA: Diagnosis not present

## 2017-01-14 LAB — URINALYSIS, COMPLETE (UACMP) WITH MICROSCOPIC
BILIRUBIN URINE: NEGATIVE
GLUCOSE, UA: NEGATIVE mg/dL
Ketones, ur: NEGATIVE mg/dL
NITRITE: NEGATIVE
PH: 5 (ref 5.0–8.0)
Protein, ur: 30 mg/dL — AB
SPECIFIC GRAVITY, URINE: 1.023 (ref 1.005–1.030)

## 2017-01-15 LAB — URINE CULTURE

## 2017-01-18 ENCOUNTER — Other Ambulatory Visit: Payer: Self-pay | Admitting: Urology

## 2017-01-18 DIAGNOSIS — R310 Gross hematuria: Secondary | ICD-10-CM

## 2017-01-26 ENCOUNTER — Ambulatory Visit (HOSPITAL_COMMUNITY)
Admission: RE | Admit: 2017-01-26 | Discharge: 2017-01-26 | Disposition: A | Discharge status: Home / Self Care | Payer: Medicare HMO | Source: Ambulatory Visit | Attending: Urology | Admitting: Urology

## 2017-01-26 DIAGNOSIS — Z950 Presence of cardiac pacemaker: Secondary | ICD-10-CM | POA: Diagnosis not present

## 2017-01-26 DIAGNOSIS — Z6839 Body mass index (BMI) 39.0-39.9, adult: Secondary | ICD-10-CM | POA: Diagnosis not present

## 2017-01-26 DIAGNOSIS — I700 Atherosclerosis of aorta: Secondary | ICD-10-CM | POA: Diagnosis not present

## 2017-01-26 DIAGNOSIS — Z7189 Other specified counseling: Secondary | ICD-10-CM | POA: Diagnosis not present

## 2017-01-26 DIAGNOSIS — Z1389 Encounter for screening for other disorder: Secondary | ICD-10-CM | POA: Diagnosis not present

## 2017-01-26 DIAGNOSIS — N8189 Other female genital prolapse: Secondary | ICD-10-CM | POA: Diagnosis not present

## 2017-01-26 DIAGNOSIS — Z299 Encounter for prophylactic measures, unspecified: Secondary | ICD-10-CM | POA: Diagnosis not present

## 2017-01-26 DIAGNOSIS — I10 Essential (primary) hypertension: Secondary | ICD-10-CM | POA: Diagnosis not present

## 2017-01-26 DIAGNOSIS — R319 Hematuria, unspecified: Secondary | ICD-10-CM | POA: Diagnosis not present

## 2017-01-26 DIAGNOSIS — Z0000 Encounter for general adult medical examination without abnormal findings: Secondary | ICD-10-CM | POA: Diagnosis not present

## 2017-01-26 DIAGNOSIS — J449 Chronic obstructive pulmonary disease, unspecified: Secondary | ICD-10-CM | POA: Diagnosis not present

## 2017-01-26 DIAGNOSIS — N8110 Cystocele, unspecified: Secondary | ICD-10-CM | POA: Insufficient documentation

## 2017-01-26 DIAGNOSIS — I5030 Unspecified diastolic (congestive) heart failure: Secondary | ICD-10-CM | POA: Diagnosis not present

## 2017-01-26 DIAGNOSIS — N289 Disorder of kidney and ureter, unspecified: Secondary | ICD-10-CM | POA: Diagnosis not present

## 2017-01-26 DIAGNOSIS — R310 Gross hematuria: Secondary | ICD-10-CM | POA: Diagnosis present

## 2017-01-26 DIAGNOSIS — I350 Nonrheumatic aortic (valve) stenosis: Secondary | ICD-10-CM | POA: Diagnosis not present

## 2017-01-26 DIAGNOSIS — E1165 Type 2 diabetes mellitus with hyperglycemia: Secondary | ICD-10-CM | POA: Diagnosis not present

## 2017-01-26 MED ORDER — IOPAMIDOL (ISOVUE-300) INJECTION 61%
125.0000 mL | Freq: Once | INTRAVENOUS | Status: AC | PRN
  Administered 2017-01-26: 125 mL via INTRAVENOUS

## 2017-01-28 DIAGNOSIS — R5383 Other fatigue: Secondary | ICD-10-CM | POA: Diagnosis not present

## 2017-01-28 DIAGNOSIS — E7800 Pure hypercholesterolemia, unspecified: Secondary | ICD-10-CM | POA: Diagnosis not present

## 2017-01-28 DIAGNOSIS — Z79899 Other long term (current) drug therapy: Secondary | ICD-10-CM | POA: Diagnosis not present

## 2017-02-14 DIAGNOSIS — I509 Heart failure, unspecified: Secondary | ICD-10-CM | POA: Diagnosis not present

## 2017-02-14 DIAGNOSIS — J449 Chronic obstructive pulmonary disease, unspecified: Secondary | ICD-10-CM | POA: Diagnosis not present

## 2017-02-14 DIAGNOSIS — E119 Type 2 diabetes mellitus without complications: Secondary | ICD-10-CM | POA: Diagnosis not present

## 2017-02-15 DIAGNOSIS — R69 Illness, unspecified: Secondary | ICD-10-CM | POA: Diagnosis not present

## 2017-02-24 ENCOUNTER — Other Ambulatory Visit: Payer: Self-pay | Admitting: Internal Medicine

## 2017-03-21 DIAGNOSIS — Z6839 Body mass index (BMI) 39.0-39.9, adult: Secondary | ICD-10-CM | POA: Diagnosis not present

## 2017-03-21 DIAGNOSIS — J45909 Unspecified asthma, uncomplicated: Secondary | ICD-10-CM | POA: Diagnosis not present

## 2017-03-21 DIAGNOSIS — R69 Illness, unspecified: Secondary | ICD-10-CM | POA: Diagnosis not present

## 2017-03-21 DIAGNOSIS — Z299 Encounter for prophylactic measures, unspecified: Secondary | ICD-10-CM | POA: Diagnosis not present

## 2017-03-21 DIAGNOSIS — J209 Acute bronchitis, unspecified: Secondary | ICD-10-CM | POA: Diagnosis not present

## 2017-03-21 DIAGNOSIS — E1165 Type 2 diabetes mellitus with hyperglycemia: Secondary | ICD-10-CM | POA: Diagnosis not present

## 2017-03-21 DIAGNOSIS — J449 Chronic obstructive pulmonary disease, unspecified: Secondary | ICD-10-CM | POA: Diagnosis not present

## 2017-03-21 DIAGNOSIS — I509 Heart failure, unspecified: Secondary | ICD-10-CM | POA: Diagnosis not present

## 2017-03-28 ENCOUNTER — Encounter: Payer: Medicare HMO | Admitting: *Deleted

## 2017-03-28 ENCOUNTER — Telehealth: Payer: Self-pay | Admitting: Cardiology

## 2017-03-28 NOTE — Telephone Encounter (Signed)
Spoke with pt and reminded pt of remote transmission that is due today. Pt verbalized understanding.   

## 2017-03-31 DIAGNOSIS — E2839 Other primary ovarian failure: Secondary | ICD-10-CM | POA: Diagnosis not present

## 2017-04-01 ENCOUNTER — Encounter: Payer: Self-pay | Admitting: Cardiology

## 2017-04-07 ENCOUNTER — Ambulatory Visit (INDEPENDENT_AMBULATORY_CARE_PROVIDER_SITE_OTHER): Payer: Medicare HMO | Admitting: *Deleted

## 2017-04-07 DIAGNOSIS — I495 Sick sinus syndrome: Secondary | ICD-10-CM

## 2017-04-08 ENCOUNTER — Ambulatory Visit: Payer: Medicare HMO | Admitting: Urology

## 2017-04-11 NOTE — Progress Notes (Signed)
Remote pacemaker transmission.   

## 2017-04-12 DIAGNOSIS — R69 Illness, unspecified: Secondary | ICD-10-CM | POA: Diagnosis not present

## 2017-04-12 LAB — CUP PACEART REMOTE DEVICE CHECK
Brady Statistic AP VP Percent: 96.72 %
Brady Statistic AP VS Percent: 0.02 %
Brady Statistic AS VS Percent: 0.24 %
Date Time Interrogation Session: 20181004182959
Implantable Lead Location: 753860
Implantable Lead Model: 3830
Implantable Pulse Generator Implant Date: 20170829
Lead Channel Impedance Value: 266 Ohm
Lead Channel Impedance Value: 418 Ohm
Lead Channel Impedance Value: 475 Ohm
Lead Channel Pacing Threshold Amplitude: 2 V
Lead Channel Sensing Intrinsic Amplitude: 3.25 mV
Lead Channel Setting Pacing Amplitude: 2 V
Lead Channel Setting Pacing Amplitude: 2.5 V
MDC IDC LEAD IMPLANT DT: 20170829
MDC IDC LEAD IMPLANT DT: 20170829
MDC IDC LEAD LOCATION: 753859
MDC IDC MSMT BATTERY REMAINING LONGEVITY: 82 mo
MDC IDC MSMT BATTERY VOLTAGE: 3 V
MDC IDC MSMT LEADCHNL RA IMPEDANCE VALUE: 304 Ohm
MDC IDC MSMT LEADCHNL RA PACING THRESHOLD AMPLITUDE: 0.625 V
MDC IDC MSMT LEADCHNL RA PACING THRESHOLD PULSEWIDTH: 0.4 ms
MDC IDC MSMT LEADCHNL RA SENSING INTR AMPL: 0.375 mV
MDC IDC MSMT LEADCHNL RA SENSING INTR AMPL: 0.375 mV
MDC IDC MSMT LEADCHNL RV PACING THRESHOLD PULSEWIDTH: 0.4 ms
MDC IDC MSMT LEADCHNL RV SENSING INTR AMPL: 3.25 mV
MDC IDC SET LEADCHNL RV PACING PULSEWIDTH: 1 ms
MDC IDC SET LEADCHNL RV SENSING SENSITIVITY: 0.9 mV
MDC IDC STAT BRADY AS VP PERCENT: 3.02 %
MDC IDC STAT BRADY RA PERCENT PACED: 81.05 %
MDC IDC STAT BRADY RV PERCENT PACED: 92.49 %

## 2017-04-14 ENCOUNTER — Encounter: Payer: Self-pay | Admitting: Cardiology

## 2017-04-19 ENCOUNTER — Ambulatory Visit: Payer: Medicare HMO | Admitting: Family

## 2017-04-19 ENCOUNTER — Encounter (HOSPITAL_COMMUNITY): Payer: Medicare HMO

## 2017-04-26 DIAGNOSIS — M06061 Rheumatoid arthritis without rheumatoid factor, right knee: Secondary | ICD-10-CM | POA: Diagnosis not present

## 2017-04-26 DIAGNOSIS — M2352 Chronic instability of knee, left knee: Secondary | ICD-10-CM | POA: Diagnosis not present

## 2017-04-26 DIAGNOSIS — M2351 Chronic instability of knee, right knee: Secondary | ICD-10-CM | POA: Diagnosis not present

## 2017-04-26 DIAGNOSIS — M06062 Rheumatoid arthritis without rheumatoid factor, left knee: Secondary | ICD-10-CM | POA: Diagnosis not present

## 2017-04-29 ENCOUNTER — Encounter: Payer: Self-pay | Admitting: Cardiology

## 2017-05-16 DIAGNOSIS — Z789 Other specified health status: Secondary | ICD-10-CM | POA: Diagnosis not present

## 2017-05-16 DIAGNOSIS — Z299 Encounter for prophylactic measures, unspecified: Secondary | ICD-10-CM | POA: Diagnosis not present

## 2017-05-16 DIAGNOSIS — I509 Heart failure, unspecified: Secondary | ICD-10-CM | POA: Diagnosis not present

## 2017-05-16 DIAGNOSIS — I5030 Unspecified diastolic (congestive) heart failure: Secondary | ICD-10-CM | POA: Diagnosis not present

## 2017-05-16 DIAGNOSIS — J449 Chronic obstructive pulmonary disease, unspecified: Secondary | ICD-10-CM | POA: Diagnosis not present

## 2017-05-16 DIAGNOSIS — Z6839 Body mass index (BMI) 39.0-39.9, adult: Secondary | ICD-10-CM | POA: Diagnosis not present

## 2017-05-16 DIAGNOSIS — J069 Acute upper respiratory infection, unspecified: Secondary | ICD-10-CM | POA: Diagnosis not present

## 2017-05-18 DIAGNOSIS — R0789 Other chest pain: Secondary | ICD-10-CM | POA: Diagnosis not present

## 2017-05-18 DIAGNOSIS — K219 Gastro-esophageal reflux disease without esophagitis: Secondary | ICD-10-CM | POA: Diagnosis not present

## 2017-05-18 DIAGNOSIS — E119 Type 2 diabetes mellitus without complications: Secondary | ICD-10-CM | POA: Diagnosis not present

## 2017-05-18 DIAGNOSIS — M47817 Spondylosis without myelopathy or radiculopathy, lumbosacral region: Secondary | ICD-10-CM | POA: Diagnosis not present

## 2017-05-18 DIAGNOSIS — I10 Essential (primary) hypertension: Secondary | ICD-10-CM | POA: Diagnosis not present

## 2017-05-18 DIAGNOSIS — D649 Anemia, unspecified: Secondary | ICD-10-CM | POA: Diagnosis not present

## 2017-05-18 DIAGNOSIS — I509 Heart failure, unspecified: Secondary | ICD-10-CM | POA: Diagnosis not present

## 2017-05-18 DIAGNOSIS — I4510 Unspecified right bundle-branch block: Secondary | ICD-10-CM | POA: Diagnosis not present

## 2017-05-18 DIAGNOSIS — R079 Chest pain, unspecified: Secondary | ICD-10-CM | POA: Diagnosis not present

## 2017-05-18 DIAGNOSIS — Z950 Presence of cardiac pacemaker: Secondary | ICD-10-CM | POA: Diagnosis not present

## 2017-05-18 DIAGNOSIS — J449 Chronic obstructive pulmonary disease, unspecified: Secondary | ICD-10-CM | POA: Diagnosis not present

## 2017-05-18 DIAGNOSIS — R0602 Shortness of breath: Secondary | ICD-10-CM | POA: Diagnosis not present

## 2017-05-18 DIAGNOSIS — Z09 Encounter for follow-up examination after completed treatment for conditions other than malignant neoplasm: Secondary | ICD-10-CM | POA: Diagnosis not present

## 2017-05-19 DIAGNOSIS — R0789 Other chest pain: Secondary | ICD-10-CM | POA: Diagnosis not present

## 2017-05-19 DIAGNOSIS — I10 Essential (primary) hypertension: Secondary | ICD-10-CM | POA: Diagnosis not present

## 2017-05-19 DIAGNOSIS — D649 Anemia, unspecified: Secondary | ICD-10-CM | POA: Diagnosis not present

## 2017-05-19 DIAGNOSIS — R0602 Shortness of breath: Secondary | ICD-10-CM | POA: Diagnosis not present

## 2017-05-19 DIAGNOSIS — E119 Type 2 diabetes mellitus without complications: Secondary | ICD-10-CM | POA: Diagnosis not present

## 2017-05-20 DIAGNOSIS — E119 Type 2 diabetes mellitus without complications: Secondary | ICD-10-CM | POA: Diagnosis not present

## 2017-05-20 DIAGNOSIS — R0789 Other chest pain: Secondary | ICD-10-CM | POA: Diagnosis not present

## 2017-05-20 DIAGNOSIS — I10 Essential (primary) hypertension: Secondary | ICD-10-CM | POA: Diagnosis not present

## 2017-05-20 DIAGNOSIS — D649 Anemia, unspecified: Secondary | ICD-10-CM | POA: Diagnosis not present

## 2017-05-27 DIAGNOSIS — D649 Anemia, unspecified: Secondary | ICD-10-CM | POA: Diagnosis not present

## 2017-05-27 DIAGNOSIS — I509 Heart failure, unspecified: Secondary | ICD-10-CM | POA: Diagnosis not present

## 2017-05-27 DIAGNOSIS — J449 Chronic obstructive pulmonary disease, unspecified: Secondary | ICD-10-CM | POA: Diagnosis not present

## 2017-05-27 DIAGNOSIS — Z09 Encounter for follow-up examination after completed treatment for conditions other than malignant neoplasm: Secondary | ICD-10-CM | POA: Diagnosis not present

## 2017-05-27 DIAGNOSIS — E1165 Type 2 diabetes mellitus with hyperglycemia: Secondary | ICD-10-CM | POA: Diagnosis not present

## 2017-05-27 DIAGNOSIS — Z6839 Body mass index (BMI) 39.0-39.9, adult: Secondary | ICD-10-CM | POA: Diagnosis not present

## 2017-06-01 ENCOUNTER — Encounter (INDEPENDENT_AMBULATORY_CARE_PROVIDER_SITE_OTHER): Payer: Self-pay | Admitting: Internal Medicine

## 2017-06-10 ENCOUNTER — Ambulatory Visit (INDEPENDENT_AMBULATORY_CARE_PROVIDER_SITE_OTHER): Payer: Medicare HMO | Admitting: Internal Medicine

## 2017-06-10 ENCOUNTER — Encounter: Payer: Self-pay | Admitting: Internal Medicine

## 2017-06-10 ENCOUNTER — Other Ambulatory Visit: Payer: Self-pay

## 2017-06-10 VITALS — BP 130/64 | HR 64 | Ht 65.0 in | Wt 227.0 lb

## 2017-06-10 DIAGNOSIS — I119 Hypertensive heart disease without heart failure: Secondary | ICD-10-CM | POA: Diagnosis not present

## 2017-06-10 DIAGNOSIS — I5032 Chronic diastolic (congestive) heart failure: Secondary | ICD-10-CM

## 2017-06-10 DIAGNOSIS — R001 Bradycardia, unspecified: Secondary | ICD-10-CM

## 2017-06-10 DIAGNOSIS — I495 Sick sinus syndrome: Secondary | ICD-10-CM | POA: Diagnosis not present

## 2017-06-10 DIAGNOSIS — I480 Paroxysmal atrial fibrillation: Secondary | ICD-10-CM

## 2017-06-10 MED ORDER — FLECAINIDE ACETATE 50 MG PO TABS: 50.0000 mg | ORAL_TABLET | Freq: Two times a day (BID) | ORAL | 3 refills | Status: DC

## 2017-06-10 NOTE — Progress Notes (Signed)
PCP: Glenda Chroman, MD Primary Cardiologist:  Dr Domenic Polite Primary EP:  Dr Rayann Heman  Miranda Sexton is a 76 y.o. female who presents today for routine electrophysiology followup. AF is well controlled.  Since last being seen in our clinic, the patient reports doing reasonably well.  She was hospitalized for GI bleed several weeks ago.  She has follow-up with GI planned.  Today, she denies symptoms of palpitations, chest pain, presyncope, or syncope.  She has chronic stable edema and SOB.  + mild postural dizziness chronically.  The patient is otherwise without complaint today.   Past Medical History:  Diagnosis Date  . Anxiety   . Asthma   . Carotid artery occlusion   . Chronic bronchitis ([place])   . Chronic diastolic heart failure ([place])   . CKD (chronic kidney disease) stage 2, GFR 60-89 ml/min   . COPD (chronic obstructive pulmonary disease) ([place])   . Coronary atherosclerosis of native coronary artery    Nonobstructive 08/2008  . Daily headache   . DJD (degenerative joint disease)   . Essential hypertension   . GERD (gastroesophageal reflux disease)   . Hyperlipidemia   . Obesity   . On home oxygen therapy   . PAD (peripheral artery disease) (HCC)    Moderate right renal artery stenosis 08/2008  . Paroxysmal atrial fibrillation (HCC)   . Sick sinus syndrome (HCC)    Medtronic dual-chamber his bundle pacemaker - Dr. Rayann Heman  . Type 2 diabetes mellitus ([place])    Past Surgical History:  Procedure Laterality Date  . CARDIAC CATHETERIZATION  2014  . CATARACT EXTRACTION W/ INTRAOCULAR LENS  IMPLANT, BILATERAL Bilateral 2008  . ENDARTERECTOMY Right 12/08/2012   Procedure: ENDARTERECTOMY CAROTID;  Surgeon: [name], MD;  Location: [facility];  Service: Vascular;  Laterality: Right;  . EP IMPLANTABLE DEVICE N/A 03/02/2016   Procedure: Pacemaker Implant;  Surgeon: [name], MD;  Location: [facility];  Service: Cardiovascular;  Laterality: N/A;  . INSERT / REPLACE / REMOVE  PACEMAKER  03/02/2016    ROS- all systems are reviewed and negative except as per HPI above  Current Outpatient Medications  Medication Sig Dispense Refill  . albuterol (PROAIR HFA) 108 (90 Base) MCG/ACT inhaler Inhale 1-2 puffs into the lungs every 6 (six) hours as needed for wheezing or shortness of breath.    Marland Kitchen amLODipine (NORVASC) 5 MG tablet Take 1 tablet (5 mg total) by mouth daily. 30 tablet 6  . benazepril (LOTENSIN) 40 MG tablet Take 40 mg by mouth daily.    . diphenoxylate-atropine (LOMOTIL) 2.5-0.025 MG tablet Take 1 tablet by mouth as needed for diarrhea or loose stools.     . flecainide (TAMBOCOR) 100 MG tablet Take 1 tablet (100 mg total) by mouth 2 (two) times daily. 60 tablet 6  . furosemide (LASIX) 40 MG tablet take 1 tablet by mouth once daily 15 tablet 0  . glipiZIDE (GLUCOTROL XL) 10 MG 24 hr tablet Take 10 mg by mouth daily.    . insulin aspart protamine- aspart (NOVOLOG 70/30) (70-30) 100 UNIT/ML injection Inject 48-58 Units into the skin 2 (two) times daily with a meal. 58 units in AM and 48 units in PM    . meclizine (ANTIVERT) 25 MG tablet take 1/2 to 1 tablet by mouth three times a day if needed and 1 tablet by mouth at bedtime  0  . metFORMIN (GLUCOPHAGE) 500 MG tablet Take 1,000 mg by mouth 2 (two) times daily.    Marland Kitchen  olopatadine (PATANOL) 0.1 % ophthalmic solution Place 1 drop into both eyes daily as needed for allergies.     Marland Kitchen omeprazole (PRILOSEC) 20 MG capsule Take 20 mg by mouth daily.    . potassium chloride SA (K-DUR,KLOR-CON) 20 MEQ tablet Take 20 mEq by mouth daily.    Marland Kitchen triamterene-hydrochlorothiazide (DYAZIDE) 37.5-25 MG capsule Take 1/2 tablet by mouth daily    . venlafaxine XR (EFFEXOR-XR) 150 MG 24 hr capsule Take 150 mg by mouth daily.     No current facility-administered medications for this visit.     Physical Exam: Vitals:   06/10/17 0759  BP: 130/64  Pulse: 64  SpO2: 96%  Weight: 227 lb (103 kg)  Height: 5\' 5"  (1.651 m)    GEN- The  patient is well appearing, alert and oriented x 3 today.   Head- normocephalic, atraumatic Eyes-  Sclera clear, conjunctiva pink Ears- hearing intact Oropharynx- clear Lungs- Clear to ausculation bilaterally, normal work of breathing Chest- pacemaker pocket is well healed Heart- Regular rate and rhythm, no murmurs, rubs or gallops, PMI not laterally displaced GI- soft, NT, ND, + BS Extremities- no clubbing, cyanosis, +1 edema  Pacemaker interrogation- reviewed in detail today,  See PACEART report  Assessment and Plan:  1. Symptomatic sinus bradycardia with marked first degree AV block Normal pacemaker function, though RV threshold has again increased to 3.25mg  @ 80msec. Reduce flecainide to 50mg  bid as this may be affecting her threshold See Pace Art report RV pacing output increased  2. Persistent afib Reduce flecainide to 50mg  BID as above Recently off of eliquis due to GI bleeding.  Hopefully can resume anticoagulation in the future pending GI eval  3. HTN Stable No change required today  4. Diastolic dysfunction Stable No change required today  Follow-up with Dr Domenic Polite as scheduled Carelink Return to see me in 6 months  [name] MD, [facility] 06/10/2017 8:43 AM

## 2017-06-10 NOTE — Patient Instructions (Addendum)
Medication Instructions:   Your physician has recommended you make the following change in your medication:   Decrease flecainide to 50 mg by mouth twice daily. You may break your 100 mg tablets in half twice daily until they are finished.  Continue all other medications the same.  Labwork:  NONE  Testing/Procedures:  NONE  Follow-Up:  Your physician recommends that you schedule a follow-up appointment in: 6 months. You will receive a reminder letter in the mail in about 4 months reminding you to call and schedule your appointment. If you don't receive this letter, please contact our office.  Any Other Special Instructions Will Be Listed Below (If Applicable).  Your next device check from home is on 07/07/17.  If you need a refill on your cardiac medications before your next appointment, please call your pharmacy.

## 2017-06-12 ENCOUNTER — Encounter (INDEPENDENT_AMBULATORY_CARE_PROVIDER_SITE_OTHER): Payer: Self-pay | Admitting: *Deleted

## 2017-06-13 ENCOUNTER — Ambulatory Visit (INDEPENDENT_AMBULATORY_CARE_PROVIDER_SITE_OTHER): Payer: Medicare HMO | Admitting: Internal Medicine

## 2017-06-13 LAB — CUP PACEART INCLINIC DEVICE CHECK
Battery Remaining Longevity: 81 mo
Battery Voltage: 3 V
Brady Statistic AS VP Percent: 2.14 %
Brady Statistic AS VS Percent: 0.14 %
Brady Statistic RA Percent Paced: 80.45 %
Implantable Lead Location: 753859
Implantable Lead Location: 753860
Implantable Lead Model: 5076
Implantable Pulse Generator Implant Date: 20170829
Lead Channel Impedance Value: 304 Ohm
Lead Channel Impedance Value: 323 Ohm
Lead Channel Impedance Value: 494 Ohm
Lead Channel Pacing Threshold Amplitude: 0.5 V
Lead Channel Pacing Threshold Pulse Width: 1 ms
Lead Channel Sensing Intrinsic Amplitude: 1.75 mV
Lead Channel Setting Pacing Amplitude: 4 V
Lead Channel Setting Sensing Sensitivity: 0.9 mV
MDC IDC LEAD IMPLANT DT: 20170829
MDC IDC LEAD IMPLANT DT: 20170829
MDC IDC MSMT LEADCHNL RA IMPEDANCE VALUE: 437 Ohm
MDC IDC MSMT LEADCHNL RA PACING THRESHOLD PULSEWIDTH: 0.4 ms
MDC IDC MSMT LEADCHNL RV PACING THRESHOLD AMPLITUDE: 3.25 V
MDC IDC MSMT LEADCHNL RV SENSING INTR AMPL: 4.75 mV
MDC IDC SESS DTM: 20181207141653
MDC IDC SET LEADCHNL RA PACING AMPLITUDE: 2 V
MDC IDC SET LEADCHNL RV PACING PULSEWIDTH: 1 ms
MDC IDC STAT BRADY AP VP PERCENT: 97.69 %
MDC IDC STAT BRADY AP VS PERCENT: 0.03 %
MDC IDC STAT BRADY RV PERCENT PACED: 92.94 %

## 2017-06-21 ENCOUNTER — Encounter (INDEPENDENT_AMBULATORY_CARE_PROVIDER_SITE_OTHER): Payer: Self-pay | Admitting: Internal Medicine

## 2017-06-21 ENCOUNTER — Ambulatory Visit (INDEPENDENT_AMBULATORY_CARE_PROVIDER_SITE_OTHER): Payer: Medicare HMO | Admitting: Internal Medicine

## 2017-06-21 VITALS — BP 118/82 | HR 68 | Temp 97.8°F | Ht 65.0 in | Wt 225.9 lb

## 2017-06-21 DIAGNOSIS — D508 Other iron deficiency anemias: Secondary | ICD-10-CM

## 2017-06-21 NOTE — Patient Instructions (Signed)
Labs today. You will probably need an EGD and a colonoscopy.

## 2017-06-21 NOTE — Progress Notes (Addendum)
Subjective:    Patient ID: Miranda Sexton, female    DOB: 01/29/1941, 76 y.o.   MRN: [id number]  HPI Referred by Dr. Woody Seller for anemia/ EGD/Colonoscopy.  Seen at [facility] in November for chest pain and found to be anemic.05/19/2017 hemoglobin 7.7. . She says she received 2 units of PRBCs. She denies seeing any rectal bleeding. Her last colonoscopy was 2 yrs ago by Dr. Anthony Sar and   was normal.  Her appetite is okay.  She says she has had weight loss 10-12 pound since November. She denies melena or BRRB. Denies prior hx of anemia.  Denies any acid reflux. No family hx of colon cancer Diabetic since 1995  01/31/2015 Colonoscopy 2016 IDA and heme positive stool: Normal colonoscopy   05/19/2017 H and H 8.8 and 30.2  Was on Eliquis for afib, CHF, hypertension, aortic stenosis Has a pacemaker  Review of Systems Past Medical History:  Diagnosis Date  . Anxiety   . Asthma   . Carotid artery occlusion   . Chronic bronchitis ([place])   . Chronic diastolic heart failure ([place])   . CKD (chronic kidney disease) stage 2, GFR 60-89 ml/min   . COPD (chronic obstructive pulmonary disease) ([place])   . Coronary atherosclerosis of native coronary artery    Nonobstructive 08/2008  . Daily headache   . DJD (degenerative joint disease)   . Essential hypertension   . GERD (gastroesophageal reflux disease)   . Hyperlipidemia   . Obesity   . On home oxygen therapy   . PAD (peripheral artery disease) (HCC)    Moderate right renal artery stenosis 08/2008  . Paroxysmal atrial fibrillation (HCC)   . Sick sinus syndrome (HCC)    Medtronic dual-chamber his bundle pacemaker - Dr. Rayann Heman  . Type 2 diabetes mellitus ([place])     Past Surgical History:  Procedure Laterality Date  . CARDIAC CATHETERIZATION  2014  . CATARACT EXTRACTION W/ INTRAOCULAR LENS  IMPLANT, BILATERAL Bilateral 2008  . ENDARTERECTOMY Right 12/08/2012   Procedure: ENDARTERECTOMY CAROTID;  Surgeon: [name], MD;  Location: [facility];  Service:  Vascular;  Laterality: Right;  . EP IMPLANTABLE DEVICE N/A 03/02/2016   Procedure: Pacemaker Implant;  Surgeon: [name], MD;  Location: [place] [facility];  Service: Cardiovascular;  Laterality: N/A;  . INSERT / REPLACE / REMOVE PACEMAKER  03/02/2016    Allergies  Allergen Reactions  . Codeine Nausea And Vomiting  . Penicillins Rash    Has patient had a PCN reaction causing immediate rash, facial/tongue/throat swelling, SOB or lightheadedness with hypotension: Yes Has patient had a PCN reaction causing severe rash involving mucus membranes or skin necrosis: No Has patient had a PCN reaction that required hospitalization No Has patient had a PCN reaction occurring within the last 10 years: No If all of the above answers are "NO", then may proceed with Cephalosporin use.     Current Outpatient Medications on File Prior to Visit  Medication Sig Dispense Refill  . albuterol (PROAIR HFA) 108 (90 Base) MCG/ACT inhaler Inhale 1-2 puffs into the lungs every 6 (six) hours as needed for wheezing or shortness of breath.    Marland Kitchen atorvastatin (LIPITOR) 40 MG tablet Take 40 mg by mouth daily.    . benazepril (LOTENSIN) 40 MG tablet Take 40 mg by mouth daily.    . diphenoxylate-atropine (LOMOTIL) 2.5-0.025 MG tablet Take 1 tablet by mouth as needed for diarrhea or loose stools.     . flecainide (TAMBOCOR) 50 MG tablet  Take 1 tablet (50 mg total) by mouth 2 (two) times daily. 180 tablet 3  . furosemide (LASIX) 40 MG tablet take 1 tablet by mouth once daily 15 tablet 0  . insulin aspart protamine- aspart (NOVOLOG 70/30) (70-30) 100 UNIT/ML injection Inject 48-58 Units into the skin 2 (two) times daily with a meal. 58 units in AM and 48 units in PM    . meclizine (ANTIVERT) 25 MG tablet take 1/2 to 1 tablet by mouth three times a day if needed and 1 tablet by mouth at bedtime  0  . metFORMIN (GLUCOPHAGE) 500 MG tablet Take 1,000 mg by mouth 2 (two) times daily.    Marland Kitchen olopatadine (PATANOL) 0.1 % ophthalmic  solution Place 1 drop into both eyes daily as needed for allergies.     Marland Kitchen omeprazole (PRILOSEC) 20 MG capsule Take 20 mg by mouth daily.    . potassium chloride SA (K-DUR,KLOR-CON) 20 MEQ tablet Take 20 mEq by mouth daily.    Marland Kitchen venlafaxine XR (EFFEXOR-XR) 150 MG 24 hr capsule Take 150 mg by mouth daily.    Marland Kitchen glipiZIDE (GLUCOTROL XL) 10 MG 24 hr tablet Take 10 mg by mouth daily.    Marland Kitchen triamterene-hydrochlorothiazide (DYAZIDE) 37.5-25 MG capsule Take 1/2 tablet by mouth daily     No current facility-administered medications on file prior to visit.         Objective:   Physical Exam Blood pressure 118/82, pulse 68, temperature 97.8 F (36.6 C), height 5\' 5"  (1.651 m), weight 225 lb 14.4 oz (102.5 kg).  Alert and oriented. Skin warm and dry. Oral mucosa is moist.   . Sclera anicteric, conjunctivae is pink. Thyroid not enlarged. No cervical lymphadenopathy. Lungs clear. Heart regular rate and rhythm.  Abdomen is soft. Bowel sounds are positive. No hepatomegaly. No abdominal masses felt. No tenderness.  No edema to lower extremities.  Stool brown and guaiac negative.      Assessment & Plan:  Anemia. Iron studies, CBC Further recommendations to follow.  Once back, will schedule probable EGD. Colonoscopy normal 2 yrs ago. Will get labs and last colonoscopy from [facility] Eliquis is on hold.

## 2017-06-22 ENCOUNTER — Telehealth (INDEPENDENT_AMBULATORY_CARE_PROVIDER_SITE_OTHER): Payer: Self-pay | Admitting: Internal Medicine

## 2017-06-22 ENCOUNTER — Other Ambulatory Visit (INDEPENDENT_AMBULATORY_CARE_PROVIDER_SITE_OTHER): Payer: Self-pay | Admitting: Internal Medicine

## 2017-06-22 DIAGNOSIS — D508 Other iron deficiency anemias: Secondary | ICD-10-CM

## 2017-06-22 DIAGNOSIS — R69 Illness, unspecified: Secondary | ICD-10-CM | POA: Diagnosis not present

## 2017-06-22 LAB — CBC WITH DIFFERENTIAL/PLATELET
Basophils Absolute: 53 cells/uL (ref 0–200)
Basophils Relative: 0.8 %
EOS PCT: 5 %
Eosinophils Absolute: 330 cells/uL (ref 15–500)
HEMATOCRIT: 36 % (ref 35.0–45.0)
Hemoglobin: 11.1 g/dL — ABNORMAL LOW (ref 11.7–15.5)
LYMPHS ABS: 1676 {cells}/uL (ref 850–3900)
MCH: 24.8 pg — ABNORMAL LOW (ref 27.0–33.0)
MCHC: 30.8 g/dL — AB (ref 32.0–36.0)
MCV: 80.4 fL (ref 80.0–100.0)
MONOS PCT: 10.7 %
MPV: 11.1 fL (ref 7.5–12.5)
NEUTROS ABS: 3835 {cells}/uL (ref 1500–7800)
Neutrophils Relative %: 58.1 %
Platelets: 329 10*3/uL (ref 140–400)
RBC: 4.48 10*6/uL (ref 3.80–5.10)
RDW: 17.8 % — ABNORMAL HIGH (ref 11.0–15.0)
Total Lymphocyte: 25.4 %
WBC mixed population: 706 cells/uL (ref 200–950)
WBC: 6.6 10*3/uL (ref 3.8–10.8)

## 2017-06-22 LAB — IRON, TOTAL/TOTAL IRON BINDING CAP
%SAT: 22 % (ref 11–50)
IRON: 67 ug/dL (ref 45–160)
TIBC: 310 ug/dL (ref 250–450)

## 2017-06-22 LAB — FERRITIN: Ferritin: 16 ng/mL — ABNORMAL LOW (ref 20–288)

## 2017-06-22 NOTE — Telephone Encounter (Signed)
Miranda Sexton, EGD/Colonoscopy. I have spoken with patient. Hold am and Pm Metformin She can take the Glipizide.

## 2017-06-23 ENCOUNTER — Telehealth (INDEPENDENT_AMBULATORY_CARE_PROVIDER_SITE_OTHER): Payer: Self-pay | Admitting: *Deleted

## 2017-06-23 ENCOUNTER — Encounter (INDEPENDENT_AMBULATORY_CARE_PROVIDER_SITE_OTHER): Payer: Self-pay | Admitting: *Deleted

## 2017-06-23 DIAGNOSIS — D649 Anemia, unspecified: Secondary | ICD-10-CM | POA: Insufficient documentation

## 2017-06-23 MED ORDER — PEG 3350-KCL-NA BICARB-NACL 420 G PO SOLR: 4000.0000 mL | Freq: Once | ORAL | 0 refills | Status: AC

## 2017-06-23 NOTE — Telephone Encounter (Signed)
Hold insulin

## 2017-06-23 NOTE — Telephone Encounter (Signed)
err

## 2017-06-23 NOTE — Telephone Encounter (Signed)
Instructions mailed to patient.

## 2017-06-23 NOTE — Telephone Encounter (Signed)
Patient needs trilyte 

## 2017-06-23 NOTE — Telephone Encounter (Signed)
How does she need to adjust insulin -- TCS/EGD sch'd 08/03/17 at 145 (1245), patient aware, instructions mailed

## 2017-06-29 ENCOUNTER — Telehealth (INDEPENDENT_AMBULATORY_CARE_PROVIDER_SITE_OTHER): Payer: Self-pay | Admitting: *Deleted

## 2017-06-29 MED ORDER — PEG 3350-KCL-NA BICARB-NACL 420 G PO SOLR: 4000.0000 mL | Freq: Once | ORAL | 0 refills | Status: AC

## 2017-06-29 NOTE — Telephone Encounter (Signed)
Patient needs trilyte 

## 2017-07-01 DIAGNOSIS — I10 Essential (primary) hypertension: Secondary | ICD-10-CM | POA: Diagnosis not present

## 2017-07-01 DIAGNOSIS — Z6837 Body mass index (BMI) 37.0-37.9, adult: Secondary | ICD-10-CM | POA: Diagnosis not present

## 2017-07-01 DIAGNOSIS — E1165 Type 2 diabetes mellitus with hyperglycemia: Secondary | ICD-10-CM | POA: Diagnosis not present

## 2017-07-01 DIAGNOSIS — Z299 Encounter for prophylactic measures, unspecified: Secondary | ICD-10-CM | POA: Diagnosis not present

## 2017-07-01 DIAGNOSIS — I5030 Unspecified diastolic (congestive) heart failure: Secondary | ICD-10-CM | POA: Diagnosis not present

## 2017-07-01 DIAGNOSIS — J449 Chronic obstructive pulmonary disease, unspecified: Secondary | ICD-10-CM | POA: Diagnosis not present

## 2017-07-07 ENCOUNTER — Telehealth: Payer: Self-pay | Admitting: Cardiology

## 2017-07-07 ENCOUNTER — Encounter: Payer: Medicare HMO | Admitting: *Deleted

## 2017-07-07 NOTE — Telephone Encounter (Signed)
Confirmed remote transmission w/ pt family member.   

## 2017-07-08 ENCOUNTER — Encounter: Payer: Self-pay | Admitting: Cardiology

## 2017-07-15 ENCOUNTER — Telehealth: Payer: Self-pay | Admitting: Internal Medicine

## 2017-07-15 ENCOUNTER — Ambulatory Visit (INDEPENDENT_AMBULATORY_CARE_PROVIDER_SITE_OTHER): Payer: Medicare HMO | Admitting: *Deleted

## 2017-07-15 DIAGNOSIS — I495 Sick sinus syndrome: Secondary | ICD-10-CM | POA: Diagnosis not present

## 2017-07-15 NOTE — Progress Notes (Deleted)
Cardiology Office Note  Date: 07/15/2017   ID: Miranda Sexton, DOB [birth year], MRN [id number]  PCP: Glenda Chroman, MD  Primary Cardiologist: Rozann Lesches, MD   No chief complaint on file.   History of Present Illness: Miranda Sexton is a 77 y.o. female last seen in June 2018.  She continues to follow with Dr. Rayann Heman in the device clinic, Medtronic dual-chamber pacemaker with His bundle pacing in place for sick sinus syndrome.  Last carotid Dopplers with [facility] were in October 2017 demonstrating patent right CEA site with 49-70% LICA stenosis.  Past Medical History:  Diagnosis Date  . Anxiety   . Asthma   . Carotid artery occlusion   . Chronic bronchitis ([place])   . Chronic diastolic heart failure ([place])   . CKD (chronic kidney disease) stage 2, GFR 60-89 ml/min   . COPD (chronic obstructive pulmonary disease) ([place])   . Coronary atherosclerosis of native coronary artery    Nonobstructive 08/2008  . Daily headache   . DJD (degenerative joint disease)   . Essential hypertension   . GERD (gastroesophageal reflux disease)   . Hyperlipidemia   . Obesity   . On home oxygen therapy   . PAD (peripheral artery disease) (HCC)    Moderate right renal artery stenosis 08/2008  . Paroxysmal atrial fibrillation (HCC)   . Sick sinus syndrome (HCC)    Medtronic dual-chamber his bundle pacemaker - Dr. Rayann Heman  . Type 2 diabetes mellitus ([place])     Past Surgical History:  Procedure Laterality Date  . CARDIAC CATHETERIZATION  2014  . CATARACT EXTRACTION W/ INTRAOCULAR LENS  IMPLANT, BILATERAL Bilateral 2008  . ENDARTERECTOMY Right 12/08/2012   Procedure: ENDARTERECTOMY CAROTID;  Surgeon: [name], MD;  Location: [facility];  Service: Vascular;  Laterality: Right;  . EP IMPLANTABLE DEVICE N/A 03/02/2016   Procedure: Pacemaker Implant;  Surgeon: [name], MD;  Location: [facility];  Service: Cardiovascular;  Laterality: N/A;  . INSERT / REPLACE / REMOVE PACEMAKER  03/02/2016     Current Outpatient Medications  Medication Sig Dispense Refill  . albuterol (PROAIR HFA) 108 (90 Base) MCG/ACT inhaler Inhale 1-2 puffs into the lungs every 6 (six) hours as needed for wheezing or shortness of breath.    Marland Kitchen atorvastatin (LIPITOR) 40 MG tablet Take 40 mg by mouth daily.    . benazepril (LOTENSIN) 40 MG tablet Take 40 mg by mouth daily.    . diphenoxylate-atropine (LOMOTIL) 2.5-0.025 MG tablet Take 1 tablet by mouth as needed for diarrhea or loose stools.     . flecainide (TAMBOCOR) 50 MG tablet Take 1 tablet (50 mg total) by mouth 2 (two) times daily. 180 tablet 3  . furosemide (LASIX) 40 MG tablet take 1 tablet by mouth once daily 15 tablet 0  . glipiZIDE (GLUCOTROL XL) 10 MG 24 hr tablet Take 10 mg by mouth daily.    . insulin aspart protamine- aspart (NOVOLOG 70/30) (70-30) 100 UNIT/ML injection Inject 48-58 Units into the skin 2 (two) times daily with a meal. 58 units in AM and 48 units in PM    . meclizine (ANTIVERT) 25 MG tablet take 1/2 to 1 tablet by mouth three times a day if needed and 1 tablet by mouth at bedtime  0  . metFORMIN (GLUCOPHAGE) 500 MG tablet Take 1,000 mg by mouth 2 (two) times daily.    Marland Kitchen olopatadine (PATANOL) 0.1 % ophthalmic solution Place 1 drop into both eyes daily as needed  for allergies.     Marland Kitchen omeprazole (PRILOSEC) 20 MG capsule Take 20 mg by mouth daily.    . potassium chloride SA (K-DUR,KLOR-CON) 20 MEQ tablet Take 20 mEq by mouth daily.    Marland Kitchen triamterene-hydrochlorothiazide (DYAZIDE) 37.5-25 MG capsule Take 1/2 tablet by mouth daily    . venlafaxine XR (EFFEXOR-XR) 150 MG 24 hr capsule Take 150 mg by mouth daily.     No current facility-administered medications for this visit.    Allergies:  Codeine and Penicillins   Social History: The patient  reports that  has never smoked. she has never used smokeless tobacco. She reports that she does not drink alcohol or use drugs.   Family History: The patient's family history includes Cancer in  her mother; Deep vein thrombosis in her mother; Diabetes in her brother, mother, and sister; Heart disease in her mother and sister; Hyperlipidemia in her brother and sister; Hypertension in her brother, father, mother, and sister.   ROS:  Please see the history of present illness. Otherwise, complete review of systems is positive for {NONE DEFAULTED:18576::"none"}.  All other systems are reviewed and negative.   Physical Exam: VS:  There were no vitals taken for this visit., BMI There is no height or weight on file to calculate BMI.  Wt Readings from Last 3 Encounters:  06/21/17 225 lb 14.4 oz (102.5 kg)  06/10/17 227 lb (103 kg)  12/15/16 238 lb (108 kg)    General: Patient appears comfortable at rest. HEENT: Conjunctiva and lids normal, oropharynx clear with moist mucosa. Neck: Supple, no elevated JVP or carotid bruits, no thyromegaly. Lungs: Clear to auscultation, nonlabored breathing at rest. Cardiac: Regular rate and rhythm, no S3 or significant systolic murmur, no pericardial rub. Abdomen: Soft, nontender, no hepatomegaly, bowel sounds present, no guarding or rebound. Extremities: No pitting edema, distal pulses 2+. Skin: Warm and dry. Musculoskeletal: No kyphosis. Neuropsychiatric: Alert and oriented x3, affect grossly appropriate.  ECG: I personally reviewed the tracing from 33/03/2017 which showed an AV paced rhythm.  Recent Labwork: 08/24/2016: ALT 25; AST 28; BUN 16; Creatinine, Ser 1.06; Potassium 4.2; Sodium 140 06/21/2017: Hemoglobin 11.1; Platelets 329   Other Studies Reviewed Today:  Echocardiogram 03/02/2016: Study Conclusions  - Left ventricle: The cavity size was normal. Wall thickness was   increased in a pattern of mild LVH. Systolic function was normal.   The estimated ejection fraction was in the range of 55% to 60%.   Wall motion was normal; there were no regional wall motion   abnormalities. - Aortic valve: Valve area (VTI): 1.37 cm^2. Valve area  (Vmax):   1.32 cm^2. Valve area (Vmean): 1.5 cm^2. - Mitral valve: There was mild regurgitation. - Right atrium: The atrium was mildly dilated.  Assessment and Plan:    Current medicines were reviewed with the patient today.  No orders of the defined types were placed in this encounter.   Disposition:  Signed, Satira Sark, MD, [facility] 07/15/2017 4:45 PM    [facility] [address], [place], [place] [address] Phone: [phone]; Fax: [phone]

## 2017-07-15 NOTE — Telephone Encounter (Signed)
Informed pt that transmission was received.  

## 2017-07-15 NOTE — Telephone Encounter (Signed)
New message    1. Has your device fired? NO  2. Is you device beeping? NO  3. Are you experiencing draining or swelling at device site? NO  4. Are you calling to see if we received your device transmission? Patient calling to say she is sending transmission today  5. Have you passed out? NO    Please route to [place]

## 2017-07-18 ENCOUNTER — Ambulatory Visit: Payer: Medicare HMO | Admitting: Cardiology

## 2017-07-19 NOTE — Progress Notes (Signed)
Remote pacemaker transmission.   

## 2017-07-22 ENCOUNTER — Encounter: Payer: Self-pay | Admitting: Cardiology

## 2017-08-03 ENCOUNTER — Encounter (HOSPITAL_COMMUNITY): Payer: Self-pay | Admitting: *Deleted

## 2017-08-03 ENCOUNTER — Encounter (HOSPITAL_COMMUNITY)
Admission: RE | Disposition: A | Discharge status: Home / Self Care | Payer: Self-pay | Source: Ambulatory Visit | Attending: Internal Medicine

## 2017-08-03 ENCOUNTER — Ambulatory Visit (HOSPITAL_COMMUNITY)
Admission: RE | Admit: 2017-08-03 | Discharge: 2017-08-03 | Disposition: A | Discharge status: Home / Self Care | Payer: Medicare HMO | Source: Ambulatory Visit | Attending: Internal Medicine | Admitting: Internal Medicine

## 2017-08-03 ENCOUNTER — Other Ambulatory Visit: Payer: Self-pay

## 2017-08-03 DIAGNOSIS — B9681 Helicobacter pylori [H. pylori] as the cause of diseases classified elsewhere: Secondary | ICD-10-CM | POA: Insufficient documentation

## 2017-08-03 DIAGNOSIS — Z7901 Long term (current) use of anticoagulants: Secondary | ICD-10-CM | POA: Diagnosis not present

## 2017-08-03 DIAGNOSIS — K6289 Other specified diseases of anus and rectum: Secondary | ICD-10-CM | POA: Insufficient documentation

## 2017-08-03 DIAGNOSIS — E1151 Type 2 diabetes mellitus with diabetic peripheral angiopathy without gangrene: Secondary | ICD-10-CM | POA: Insufficient documentation

## 2017-08-03 DIAGNOSIS — D123 Benign neoplasm of transverse colon: Secondary | ICD-10-CM | POA: Diagnosis not present

## 2017-08-03 DIAGNOSIS — Z950 Presence of cardiac pacemaker: Secondary | ICD-10-CM | POA: Insufficient documentation

## 2017-08-03 DIAGNOSIS — D509 Iron deficiency anemia, unspecified: Secondary | ICD-10-CM | POA: Diagnosis present

## 2017-08-03 DIAGNOSIS — D508 Other iron deficiency anemias: Secondary | ICD-10-CM

## 2017-08-03 DIAGNOSIS — Z6837 Body mass index (BMI) 37.0-37.9, adult: Secondary | ICD-10-CM | POA: Insufficient documentation

## 2017-08-03 DIAGNOSIS — J449 Chronic obstructive pulmonary disease, unspecified: Secondary | ICD-10-CM | POA: Diagnosis not present

## 2017-08-03 DIAGNOSIS — K259 Gastric ulcer, unspecified as acute or chronic, without hemorrhage or perforation: Secondary | ICD-10-CM | POA: Diagnosis not present

## 2017-08-03 DIAGNOSIS — D649 Anemia, unspecified: Secondary | ICD-10-CM | POA: Diagnosis not present

## 2017-08-03 DIAGNOSIS — M1990 Unspecified osteoarthritis, unspecified site: Secondary | ICD-10-CM | POA: Insufficient documentation

## 2017-08-03 DIAGNOSIS — F419 Anxiety disorder, unspecified: Secondary | ICD-10-CM | POA: Diagnosis not present

## 2017-08-03 DIAGNOSIS — Z79899 Other long term (current) drug therapy: Secondary | ICD-10-CM | POA: Insufficient documentation

## 2017-08-03 DIAGNOSIS — E1122 Type 2 diabetes mellitus with diabetic chronic kidney disease: Secondary | ICD-10-CM | POA: Diagnosis not present

## 2017-08-03 DIAGNOSIS — I480 Paroxysmal atrial fibrillation: Secondary | ICD-10-CM | POA: Insufficient documentation

## 2017-08-03 DIAGNOSIS — Z794 Long term (current) use of insulin: Secondary | ICD-10-CM | POA: Insufficient documentation

## 2017-08-03 DIAGNOSIS — E669 Obesity, unspecified: Secondary | ICD-10-CM | POA: Diagnosis not present

## 2017-08-03 DIAGNOSIS — K219 Gastro-esophageal reflux disease without esophagitis: Secondary | ICD-10-CM | POA: Insufficient documentation

## 2017-08-03 DIAGNOSIS — D122 Benign neoplasm of ascending colon: Secondary | ICD-10-CM | POA: Diagnosis not present

## 2017-08-03 DIAGNOSIS — I130 Hypertensive heart and chronic kidney disease with heart failure and stage 1 through stage 4 chronic kidney disease, or unspecified chronic kidney disease: Secondary | ICD-10-CM | POA: Insufficient documentation

## 2017-08-03 DIAGNOSIS — Z9981 Dependence on supplemental oxygen: Secondary | ICD-10-CM | POA: Insufficient documentation

## 2017-08-03 DIAGNOSIS — E785 Hyperlipidemia, unspecified: Secondary | ICD-10-CM | POA: Diagnosis not present

## 2017-08-03 DIAGNOSIS — R12 Heartburn: Secondary | ICD-10-CM | POA: Insufficient documentation

## 2017-08-03 DIAGNOSIS — K648 Other hemorrhoids: Secondary | ICD-10-CM | POA: Insufficient documentation

## 2017-08-03 DIAGNOSIS — K644 Residual hemorrhoidal skin tags: Secondary | ICD-10-CM | POA: Insufficient documentation

## 2017-08-03 DIAGNOSIS — Z7984 Long term (current) use of oral hypoglycemic drugs: Secondary | ICD-10-CM | POA: Insufficient documentation

## 2017-08-03 DIAGNOSIS — Z885 Allergy status to narcotic agent status: Secondary | ICD-10-CM | POA: Insufficient documentation

## 2017-08-03 DIAGNOSIS — N182 Chronic kidney disease, stage 2 (mild): Secondary | ICD-10-CM | POA: Insufficient documentation

## 2017-08-03 DIAGNOSIS — K2950 Unspecified chronic gastritis without bleeding: Secondary | ICD-10-CM | POA: Insufficient documentation

## 2017-08-03 DIAGNOSIS — I5032 Chronic diastolic (congestive) heart failure: Secondary | ICD-10-CM | POA: Diagnosis not present

## 2017-08-03 DIAGNOSIS — Z8249 Family history of ischemic heart disease and other diseases of the circulatory system: Secondary | ICD-10-CM | POA: Insufficient documentation

## 2017-08-03 DIAGNOSIS — I2510 Atherosclerotic heart disease of native coronary artery without angina pectoris: Secondary | ICD-10-CM | POA: Insufficient documentation

## 2017-08-03 DIAGNOSIS — Z880 Allergy status to penicillin: Secondary | ICD-10-CM | POA: Insufficient documentation

## 2017-08-03 HISTORY — PX: BIOPSY: SHX5522

## 2017-08-03 HISTORY — PX: COLONOSCOPY: SHX5424

## 2017-08-03 HISTORY — PX: ESOPHAGOGASTRODUODENOSCOPY: SHX5428

## 2017-08-03 HISTORY — PX: POLYPECTOMY: SHX5525

## 2017-08-03 LAB — GLUCOSE, CAPILLARY: Glucose-Capillary: 86 mg/dL (ref 65–99)

## 2017-08-03 SURGERY — COLONOSCOPY
Anesthesia: Moderate Sedation

## 2017-08-03 MED ORDER — DEXTROSE-NACL 5-0.9 % IV SOLN: INTRAVENOUS | Status: DC

## 2017-08-03 MED ORDER — LIDOCAINE VISCOUS 2 % MT SOLN
OROMUCOSAL | Status: DC | PRN
  Administered 2017-08-03: 4 mL via OROMUCOSAL

## 2017-08-03 MED ORDER — MEPERIDINE HCL 50 MG/ML IJ SOLN
INTRAMUSCULAR | Status: AC
  Filled 2017-08-03: qty 1

## 2017-08-03 MED ORDER — MIDAZOLAM HCL 5 MG/5ML IJ SOLN
INTRAMUSCULAR | Status: AC
  Filled 2017-08-03: qty 10

## 2017-08-03 MED ORDER — MIDAZOLAM HCL 5 MG/5ML IJ SOLN
INTRAMUSCULAR | Status: DC | PRN
  Administered 2017-08-03: 1 mg via INTRAVENOUS
  Administered 2017-08-03 (×2): 2 mg via INTRAVENOUS

## 2017-08-03 MED ORDER — PANTOPRAZOLE SODIUM 40 MG PO TBEC: 40.0000 mg | DELAYED_RELEASE_TABLET | Freq: Two times a day (BID) | ORAL | 2 refills | Status: DC

## 2017-08-03 MED ORDER — LIDOCAINE VISCOUS 2 % MT SOLN
OROMUCOSAL | Status: AC
  Filled 2017-08-03: qty 15

## 2017-08-03 MED ORDER — SODIUM CHLORIDE 0.9 % IV SOLN: INTRAVENOUS | Status: DC

## 2017-08-03 MED ORDER — STERILE WATER FOR IRRIGATION IR SOLN
Status: DC | PRN
  Administered 2017-08-03: 14:00:00

## 2017-08-03 MED ORDER — MEPERIDINE HCL 50 MG/ML IJ SOLN
INTRAMUSCULAR | Status: DC | PRN
  Administered 2017-08-03 (×2): 25 mg via INTRAVENOUS

## 2017-08-03 MED ORDER — ELIQUIS 5 MG PO TABS: 5.0000 mg | ORAL_TABLET | Freq: Two times a day (BID) | ORAL | 0 refills | Status: DC

## 2017-08-03 NOTE — Op Note (Addendum)
[facility] Patient Name: Miranda Sexton Procedure Date: 08/03/2017 1:29 PM MRN: [id number] Date of Birth: 09-29-1940 Attending MD: Hildred Laser , MD CSN: [id number] Age: 77 Admit Type: Outpatient Procedure:                Upper GI endoscopy Indications:              Unexplained iron deficiency anemia Providers:                Hildred Laser, MD, [place] B. Sharon Seller, RN, Aram Candela Referring MD:             Glenda Chroman, MD Medicines:                Lidocaine spray, Meperidine 50 mg IV, Midazolam 5                            mg IV Complications:            No immediate complications. Estimated Blood Loss:     Estimated blood loss was minimal. Procedure:                Pre-Anesthesia Assessment:                           - Prior to the procedure, a History and Physical                            was performed, and patient medications and                            allergies were reviewed. The patient's tolerance of                            previous anesthesia was also reviewed. The risks                            and benefits of the procedure and the sedation                            options and risks were discussed with the patient.                            All questions were answered, and informed consent                            was obtained. Prior Anticoagulants: The patient has                            taken no previous anticoagulant or antiplatelet                            agents. ASA Grade Assessment: III - A patient with  severe systemic disease. After reviewing the risks                            and benefits, the patient was deemed in                            satisfactory condition to undergo the procedure.                           After obtaining informed consent, the endoscope was                            passed under direct vision. Throughout the                            procedure, the  patient's blood pressure, pulse, and                            oxygen saturations were monitored continuously. The                            [id number] ([id number]) scope was introduced through the                            mouth, and advanced to the second part of duodenum.                            The upper GI endoscopy was technically difficult                            and complex due to the patient's position                            intolerance. Successful completion of the procedure                            was aided by withdrawing the scope and replacing                            with the 'babyscope'. The patient tolerated the                            procedure well. Scope In: 1:52:43 PM Scope Out: 2:06:26 PM Total Procedure Duration: 0 hours 13 minutes 43 seconds  Findings:      The examined esophagus was normal.      The Z-line was regular and was found 40 cm from the incisors.      One non-bleeding cratered gastric ulcer with no stigmata of bleeding was       found in the prepyloric region of the stomach. The lesion was four mm by       eight mm in largest dimension. Biopsies were taken with a cold forceps       for histology.      The exam of the stomach was otherwise normal.  The duodenal bulb and second portion of the duodenum were normal. Impression:               - Normal esophagus.                           - Z-line regular, 40 cm from the incisors.                           - Non-bleeding gastric ulcer with no stigmata of                            bleeding. Biopsied.                           - Normal duodenal bulb and second portion of the                            duodenum. Moderate Sedation:      Moderate (conscious) sedation was administered by the endoscopy nurse       and supervised by the endoscopist. The following parameters were       monitored: oxygen saturation, heart rate, blood pressure, CO2       capnography and response to care. Total  physician intraservice time was       19 minutes. Recommendation:           - Patient has a contact number available for                            emergencies. The signs and symptoms of potential                            delayed complications were discussed with the                            patient. Return to normal activities tomorrow.                            Written discharge instructions were provided to the                            patient.                           - Resume previous diet today.                           - Continue present medications.                           - Stop Omeprazole.                           - Begin pantoprazole 40 mg by mouth twice daily.                           - No aspirin, ibuprofen, naproxen, or other  non-steroidal anti-inflammatory drugs for 10 days.                           - Await pathology results.                           - Repeat upper endoscopy in 12 weeks to check                            healing. Procedure Code(s):        --- Professional ---                           [phone], Esophagogastroduodenoscopy, flexible,                            transoral; with biopsy, single or multiple                           99152, Moderate sedation services provided by the                            same physician or other qualified health care                            professional performing the diagnostic or                            therapeutic service that the sedation supports,                            requiring the presence of an independent trained                            observer to assist in the monitoring of the                            patient's level of consciousness and physiological                            status; initial 15 minutes of intraservice time,                            patient age 51 years or older Diagnosis Code(s):        --- Professional ---                           K25.9,  Gastric ulcer, unspecified as acute or                            chronic, without hemorrhage or perforation                           D50.9, Iron deficiency anemia, unspecified CPT copyright 2016 [employer]. All rights reserved. The codes documented in this report are preliminary  and upon coder review may  be revised to meet current compliance requirements. Hildred Laser, MD Hildred Laser, MD 08/03/2017 3:30:13 PM This report has been signed electronically. Number of Addenda: 0

## 2017-08-03 NOTE — Op Note (Signed)
[facility] Patient Name: Miranda Sexton Procedure Date: 08/03/2017 1:28 PM MRN: [id number] Date of Birth: 04-20-1941 Attending MD: Hildred Laser , MD CSN: [id number] Age: 77 Admit Type: Outpatient Procedure:                Colonoscopy Indications:              Unexplained iron deficiency anemia Providers:                Hildred Laser, MD, Otis Peak B. Sharon Seller, RN, Aram Candela Referring MD:             Glenda Chroman, MD Medicines:                None Complications:            No immediate complications. Estimated Blood Loss:     Estimated blood loss was minimal. Procedure:                Pre-Anesthesia Assessment:                           - Prior to the procedure, a History and Physical                            was performed, and patient medications and                            allergies were reviewed. The patient's tolerance of                            previous anesthesia was also reviewed. The risks                            and benefits of the procedure and the sedation                            options and risks were discussed with the patient.                            All questions were answered, and informed consent                            was obtained. Prior Anticoagulants: The patient has                            taken no previous anticoagulant or antiplatelet                            agents. ASA Grade Assessment: III - A patient with                            severe systemic disease. After reviewing the risks  and benefits, the patient was deemed in                            satisfactory condition to undergo the procedure.                           After obtaining informed consent, the colonoscope                            was passed under direct vision. Throughout the                            procedure, the patient's blood pressure, pulse, and                            oxygen saturations were  monitored continuously. The                            EC-[id number] ([id number]) scope was introduced through                            the anus and advanced to the the cecum, identified                            by appendiceal orifice and ileocecal valve. The                            colonoscopy was performed without difficulty. The                            patient tolerated the procedure well. The quality                            of the bowel preparation was adequate to identify                            polyps 6 mm and larger in size. The ileocecal                            valve, appendiceal orifice, and rectum were                            photographed. Scope In: 2:10:02 PM Scope Out: 3:04:43 PM Scope Withdrawal Time: 0 hours 49 minutes 8 seconds  Total Procedure Duration: 0 hours 54 minutes 41 seconds  Findings:      The perianal exam findings include a skin tag.      A small polyp was found in the ascending colon. The polyp was sessile.       Biopsies were taken with a cold forceps for histology. The pathology       specimen was placed into Bottle Number 1.      A 7 mm polyp was found in the hepatic flexure. The polyp was       semi-pedunculated. The polyp was removed with a hot snare. Resection  and       retrieval were complete. The pathology specimen was placed into Bottle       Number 1.      A greater than 50 mm polyp was found in the ascending colon. The polyp       was carpet-like and multi-lobulated. Area was unsuccessfully injected       with eleview for lesion elevation, but the lesion could not be lifted       adequately. The polyp was removed with a piecemeal technique using a hot       snare. Polyp resection was incomplete. The resected tissue was       retrieved. The pathology specimen was placed into Bottle Number 2.      Internal hemorrhoids were found during retroflexion. The hemorrhoids       were small.      Anal papilla(e) were  hypertrophied. Impression:               - Perianal skin tag found on perianal exam.                           - One small polyp in the ascending colon. Biopsied.                           - One 7 mm polyp at the hepatic flexure, removed                            with a hot snare. Resected and retrieved.                           - One greater than 50 mm polyp in the ascending                            colon, removed piecemeal using a hot snare.                            Incomplete resection. Resected tissue retrieved.                           - Internal hemorrhoids.                           - Anal papilla(e) were hypertrophied. Moderate Sedation:      Moderate (conscious) sedation was administered by the endoscopy nurse       and supervised by the endoscopist. The following parameters were       monitored: oxygen saturation, heart rate, blood pressure, CO2       capnography and response to care. Total physician intraservice time was       58 minutes. Recommendation:           - Patient has a contact number available for                            emergencies. The signs and symptoms of potential                            delayed complications were discussed with  the                            patient. Return to normal activities tomorrow.                            Written discharge instructions were provided to the                            patient.                           - Resume previous diet today.                           - Continue present medications.                           - Resume Eliquis (apixaban) at prior dose in 10                            days.                           - Await pathology results.                           - Repeat colonoscopy is recommended for                            surveillance. The colonoscopy date will be                            determined after pathology results from today's                            exam become available for  review. Procedure Code(s):        --- Professional ---                           [id number], Colonoscopy, flexible; with removal of                            tumor(s), polyp(s), or other lesion(s) by snare                            technique                           45380, 59, Colonoscopy, flexible; with biopsy,                            single or multiple                           45381, Colonoscopy, flexible; with directed  submucosal injection(s), any substance                           99152, Moderate sedation services provided by the                            same physician or other qualified health care                            professional performing the diagnostic or                            therapeutic service that the sedation supports,                            requiring the presence of an independent trained                            observer to assist in the monitoring of the                            patient's level of consciousness and physiological                            status; initial 15 minutes of intraservice time,                            patient age 63 years or older                           [phone], Moderate sedation services; each additional                            15 minutes intraservice time                           99153, Moderate sedation services; each additional                            15 minutes intraservice time                           99153, Moderate sedation services; each additional                            15 minutes intraservice time Diagnosis Code(s):        --- Professional ---                           D12.3, Benign neoplasm of transverse colon (hepatic                            flexure or splenic flexure)                           D12.2, Benign neoplasm of  ascending colon                           K62.89, Other specified diseases of anus and rectum                           K64.4, Residual  hemorrhoidal skin tags                           K64.8, Other hemorrhoids                           D50.9, Iron deficiency anemia, unspecified CPT copyright 2016 American Medical Association. All rights reserved. The codes documented in this report are preliminary and upon coder review may  be revised to meet current compliance requirements. Hildred Laser, MD Hildred Laser, MD 08/03/2017 3:36:54 PM This report has been signed electronically. Number of Addenda: 0

## 2017-08-03 NOTE — H&P (Signed)
Miranda Sexton is an 77 y.o. female.   Chief Complaint: Patient is here for EGD and colonoscopy. HPI: Patient is 77 year old Afro-American female with multiple medical problems who is on Eliquis for atrial fibrillation who was admitted to [place] about 10 weeks ago with hemoglobin of 7.7 g.  She was given 2 units of PRBCs.  She was referred to our office for further evaluation.  Eliquis has been on hold. She denies melena or rectal bleeding.  She also denies diarrhea constipation or abdominal pain.  She has been having daily heartburn lately but no dysphagia.  She has good appetite and her weight has been stable. When she was seen in our office about 5 weeks ago her hemoglobin is 11.1 g.  Stool was guaiac negative.  Iron studies confirmed iron deficiency anemia.  Last colonoscopy was in July 2016 and poorly was normal.  Patient does not take OTC NSAIDs. Family history is negative for CRC.  Past Medical History:  Diagnosis Date  . Anxiety   . Asthma   . Carotid artery occlusion   . Chronic bronchitis ([place])   . Chronic diastolic heart failure ([place])   . CKD (chronic kidney disease) stage 2, GFR 60-89 ml/min   . COPD (chronic obstructive pulmonary disease) ([place])   . Coronary atherosclerosis of native coronary artery    Nonobstructive 08/2008  . Daily headache   . DJD (degenerative joint disease)   . Essential hypertension   . GERD (gastroesophageal reflux disease)   . Hyperlipidemia   . Obesity   . On home oxygen therapy   . PAD (peripheral artery disease) (H[facility])    Moderate right renal artery stenosis 08/2008  . Paroxysmal atrial fibrillation (HCC)   . Sick sinus syndrome (HCC)    Medtronic dual-chamber his bundle pacemaker - Dr. Rayann Heman  . Type 2 diabetes mellitus ([place])     Past Surgical History:  Procedure Laterality Date  . CARDIAC CATHETERIZATION  2014  . CATARACT EXTRACTION W/ INTRAOCULAR LENS  IMPLANT, BILATERAL Bilateral 2008  . ENDARTERECTOMY Right 12/08/2012    Procedure: ENDARTERECTOMY CAROTID;  Surgeon: [name], MD;  Location: [facility];  Service: Vascular;  Laterality: Right;  . EP IMPLANTABLE DEVICE N/A 03/02/2016   Procedure: Pacemaker Implant;  Surgeon: [name], MD;  Location: [facility];  Service: Cardiovascular;  Laterality: N/A;  . INSERT / REPLACE / REMOVE PACEMAKER  03/02/2016    Family History  Problem Relation Age of Onset  . Cancer Mother        Stomach  . Deep vein thrombosis Mother   . Diabetes Mother   . Hypertension Mother   . Heart disease Mother   . Hypertension Father   . Diabetes Sister   . Hyperlipidemia Sister   . Hypertension Sister   . Heart disease Sister   . Diabetes Brother   . Hyperlipidemia Brother   . Hypertension Brother    Social History:  reports that  has never smoked. she has never used smokeless tobacco. She reports that she does not drink alcohol or use drugs.  Allergies:  Allergies  Allergen Reactions  . Codeine Nausea And Vomiting  . Penicillins Rash and Other (See Comments)    Has patient had a PCN reaction causing immediate rash, facial/tongue/throat swelling, SOB or lightheadedness with hypotension: Yes Has patient had a PCN reaction causing severe rash involving mucus membranes or skin necrosis: No Has patient had a PCN reaction that required hospitalization No Has patient had  a PCN reaction occurring within the last 10 years: No If all of the above answers are "NO", then may proceed with Cephalosporin use.     Medications Prior to Admission  Medication Sig Dispense Refill  . albuterol (PROAIR HFA) 108 (90 Base) MCG/ACT inhaler Inhale 2 puffs into the lungs every 6 (six) hours as needed for wheezing or shortness of breath.     Marland Kitchen atorvastatin (LIPITOR) 40 MG tablet Take 40 mg by mouth daily.    . benazepril (LOTENSIN) 40 MG tablet Take 40 mg by mouth daily.    Marland Kitchen ELIQUIS 5 MG TABS tablet Take 5 mg by mouth 2 (two) times daily.  0  . flecainide (TAMBOCOR) 50 MG tablet Take 1  tablet (50 mg total) by mouth 2 (two) times daily. 180 tablet 3  . furosemide (LASIX) 40 MG tablet take 1 tablet by mouth once daily (Patient taking differently: Take 40 mg by mouth once daily) 15 tablet 0  . glipiZIDE (GLUCOTROL XL) 10 MG 24 hr tablet Take 10 mg by mouth 2 (two) times daily.     . insulin aspart protamine- aspart (NOVOLOG 70/30) (70-30) 100 UNIT/ML injection Inject 46-56 Units into the skin See admin instructions. Inject 56 units SQ in AM and 46 units SQ in PM    . meclizine (ANTIVERT) 25 MG tablet take 12.5-25 mg by mouth three times a day if needed for dizziness  0  . metFORMIN (GLUCOPHAGE) 500 MG tablet Take 500 mg by mouth 2 (two) times daily.     . Multiple Vitamins-Minerals (CENTRUM SILVER PO) Take 1 tablet by mouth daily.    Marland Kitchen olopatadine (PATANOL) 0.1 % ophthalmic solution Place 1 drop into both eyes 2 (two) times daily.     Marland Kitchen omeprazole (PRILOSEC) 20 MG capsule Take 20 mg by mouth daily.    . potassium chloride SA (K-DUR,KLOR-CON) 20 MEQ tablet Take 20 mEq by mouth daily.    Marland Kitchen venlafaxine XR (EFFEXOR-XR) 150 MG 24 hr capsule Take 150 mg by mouth daily.    Marland Kitchen triamterene-hydrochlorothiazide (DYAZIDE) 37.5-25 MG capsule Take 1/2 tablet by mouth daily (Patient not taking: Reported on 07/25/2017)      Results for orders placed or performed during the hospital encounter of 08/03/17 (from the past 48 hour(s))  Glucose, capillary     Status: None   Collection Time: 08/03/17 12:54 PM  Result Value Ref Range   Glucose-Capillary 86 65 - 99 mg/dL   No results found.  ROS  Blood pressure 99/60, pulse 61, temperature (!) 97.1 F (36.2 C), temperature source Oral, resp. rate 15, height 5\' 5"  (1.651 m), weight 223 lb (101.2 kg), SpO2 93 %. Physical Exam  Constitutional: She appears well-developed and well-nourished.  HENT:  Mouth/Throat: Oropharynx is clear and moist.  Eyes: Conjunctivae are normal. No scleral icterus.  Neck: No thyromegaly present.  Cardiovascular:   Regular rhythm normal S1 and S2.  No murmur or gallop noted.  On monitor she does not have any P waves.  Respiratory: Effort normal and breath sounds normal.  GI:  .  Soft and nontender without organomegaly or masses.  Musculoskeletal: She exhibits edema.  1+ pitting edema noted to left leg.  Lymphadenopathy:    She has no cervical adenopathy.  Neurological: She is alert.  Skin: Skin is warm and dry.     Assessment/Plan Iron deficiency anemia. Frequent heartburn. Diagnostic EGD and colonoscopy.  Hildred Laser, MD 08/03/2017, 1:40 PM

## 2017-08-03 NOTE — Discharge Instructions (Signed)
No aspirin or NSAIDs or anticoagulants for 10 days. Discontinue omeprazole. Resume other medications as before. Pantoprazole 40 mg p.o. twice daily. Resume usual diet. No driving for 24 hours. Physician will call biopsy results and further recommendations.    Esophagogastroduodenoscopy, Care After Refer to this sheet in the next few weeks. These instructions provide you with information about caring for yourself after your procedure. Your health care provider may also give you more specific instructions. Your treatment has been planned according to current medical practices, but problems sometimes occur. Call your health care provider if you have any problems or questions after your procedure. What can I expect after the procedure? After the procedure, it is common to have:  A sore throat.  Nausea.  Bloating.  Dizziness.  Fatigue.  Follow these instructions at home:  Do not eat or drink anything until the numbing medicine (local anesthetic) has worn off and your gag reflex has returned. You will know that the local anesthetic has worn off when you can swallow comfortably.  Do not drive for 24 hours if you received a medicine to help you relax (sedative).  If your health care provider took a tissue sample for testing during the procedure, make sure to get your test results. This is your responsibility. Ask your health care provider or the department performing the test when your results will be ready.  Keep all follow-up visits as told by your health care provider. This is important. Contact a health care provider if:  You cannot stop coughing.  You are not urinating.  You are urinating less than usual. Get help right away if:  You have trouble swallowing.  You cannot eat or drink.  You have throat or chest pain that gets worse.  You are dizzy or light-headed.  You faint.  You have nausea or vomiting.  You have chills.  You have a fever.  You have severe  abdominal pain.  You have black, tarry, or bloody stools. This information is not intended to replace advice given to you by your health care provider. Make sure you discuss any questions you have with your health care provider. Document Released: 06/07/2012 Document Revised: 11/27/2015 Document Reviewed: 05/15/2015 Elsevier Interactive Patient Education  2018 [employer].     Colonoscopy, Adult, Care After This sheet gives you information about how to care for yourself after your procedure. Your doctor may also give you more specific instructions. If you have problems or questions, call your doctor. Follow these instructions at home: General instructions   For the first 24 hours after the procedure: ? Do not drive or use machinery. ? Do not sign important documents. ? Do not drink alcohol. ? Do your daily activities more slowly than normal. ? Eat foods that are soft and easy to digest. ? Rest often.  Take over-the-counter or prescription medicines only as told by your doctor.  It is up to you to get the results of your procedure. Ask your doctor, or the department performing the procedure, when your results will be ready. To help cramping and bloating:  Try walking around.  Put heat on your belly (abdomen) as told by your doctor. Use a heat source that your doctor recommends, such as a moist heat pack or a heating pad. ? Put a towel between your skin and the heat source. ? Leave the heat on for 20-30 minutes. ? Remove the heat if your skin turns bright red. This is especially important if you cannot feel pain, heat,  or cold. You can get burned. Eating and drinking  Drink enough fluid to keep your pee (urine) clear or pale yellow.  Return to your normal diet as told by your doctor. Avoid heavy or fried foods that are hard to digest.  Avoid drinking alcohol for as long as told by your doctor. Contact a doctor if:  You have blood in your poop (stool) 2-3 days after the  procedure. Get help right away if:  You have more than a small amount of blood in your poop.  You see large clumps of tissue (blood clots) in your poop.  Your belly is swollen.  You feel sick to your stomach (nauseous).  You throw up (vomit).  You have a fever.  You have belly pain that gets worse, and medicine does not help your pain. This information is not intended to replace advice given to you by your health care provider. Make sure you discuss any questions you have with your health care provider. Document Released: 07/24/2010 Document Revised: 03/15/2016 Document Reviewed: 03/15/2016 Elsevier Interactive Patient Education  2017 [place].     Colon Polyps Polyps are tissue growths inside the body. Polyps can grow in many places, including the large intestine (colon). A polyp may be a round bump or a mushroom-shaped growth. You could have one polyp or several. Most colon polyps are noncancerous (benign). However, some colon polyps can become cancerous over time. What are the causes? The exact cause of colon polyps is not known. What increases the risk? This condition is more likely to develop in people who:  Have a family history of colon cancer or colon polyps.  Are older than 40 or older than 45 if they are African American.  Have inflammatory bowel disease, such as ulcerative colitis or Crohn disease.  Are overweight.  Smoke cigarettes.  Do not get enough exercise.  Drink too much alcohol.  Eat a diet that is: ? High in fat and red meat. ? Low in fiber.  Had childhood cancer that was treated with abdominal radiation.  What are the signs or symptoms? Most polyps do not cause symptoms. If you have symptoms, they may include:  Blood coming from your rectum when having a bowel movement.  Blood in your stool.The stool may look dark red or black.  A change in bowel habits, such as constipation or diarrhea.  How is this diagnosed? This condition is  diagnosed with a colonoscopy. This is a procedure that uses a lighted, flexible scope to look at the inside of your colon. How is this treated? Treatment for this condition involves removing any polyps that are found. Those polyps will then be tested for cancer. If cancer is found, your health care provider will talk to you about options for colon cancer treatment. Follow these instructions at home: Diet  Eat plenty of fiber, such as fruits, vegetables, and whole grains.  Eat foods that are high in calcium and vitamin D, such as milk, cheese, yogurt, eggs, liver, fish, and broccoli.  Limit foods high in fat, red meats, and processed meats, such as hot dogs, sausage, bacon, and lunch meats.  Maintain a healthy weight, or lose weight if recommended by your health care provider. General instructions  Do not smoke cigarettes.  Do not drink alcohol excessively.  Keep all follow-up visits as told by your health care provider. This is important. This includes keeping regularly scheduled colonoscopies. Talk to your health care provider about when you need a colonoscopy.  Exercise  every day or as told by your health care provider. Contact a health care provider if:  You have new or worsening bleeding during a bowel movement.  You have new or increased blood in your stool.  You have a change in bowel habits.  You unexpectedly lose weight. This information is not intended to replace advice given to you by your health care provider. Make sure you discuss any questions you have with your health care provider. Document Released: 03/17/2004 Document Revised: 11/27/2015 Document Reviewed: 05/12/2015 Elsevier Interactive Patient Education  [employer].

## 2017-08-05 ENCOUNTER — Encounter (HOSPITAL_COMMUNITY): Payer: Self-pay | Admitting: Internal Medicine

## 2017-08-12 DIAGNOSIS — R69 Illness, unspecified: Secondary | ICD-10-CM | POA: Diagnosis not present

## 2017-08-16 LAB — CUP PACEART REMOTE DEVICE CHECK
Battery Voltage: 2.98 V
Brady Statistic AS VS Percent: 0.02 %
Brady Statistic RA Percent Paced: 91.07 %
Date Time Interrogation Session: 20190111223655
Implantable Lead Implant Date: 20170829
Implantable Lead Location: 753859
Implantable Lead Location: 753860
Implantable Pulse Generator Implant Date: 20170829
Lead Channel Impedance Value: 285 Ohm
Lead Channel Impedance Value: 323 Ohm
Lead Channel Impedance Value: 437 Ohm
Lead Channel Pacing Threshold Amplitude: 0.625 V
Lead Channel Pacing Threshold Pulse Width: 0.4 ms
Lead Channel Pacing Threshold Pulse Width: 0.4 ms
Lead Channel Sensing Intrinsic Amplitude: 1.25 mV
Lead Channel Setting Pacing Amplitude: 2 V
MDC IDC LEAD IMPLANT DT: 20170829
MDC IDC MSMT BATTERY REMAINING LONGEVITY: 55 mo
MDC IDC MSMT LEADCHNL RA SENSING INTR AMPL: 1.25 mV
MDC IDC MSMT LEADCHNL RV IMPEDANCE VALUE: 456 Ohm
MDC IDC MSMT LEADCHNL RV PACING THRESHOLD AMPLITUDE: 2 V
MDC IDC MSMT LEADCHNL RV SENSING INTR AMPL: 3.875 mV
MDC IDC MSMT LEADCHNL RV SENSING INTR AMPL: 3.875 mV
MDC IDC SET LEADCHNL RV PACING AMPLITUDE: 4 V
MDC IDC SET LEADCHNL RV PACING PULSEWIDTH: 1 ms
MDC IDC SET LEADCHNL RV SENSING SENSITIVITY: 0.9 mV
MDC IDC STAT BRADY AP VP PERCENT: 98.69 %
MDC IDC STAT BRADY AP VS PERCENT: 0.02 %
MDC IDC STAT BRADY AS VP PERCENT: 1.27 %
MDC IDC STAT BRADY RV PERCENT PACED: 99.65 %

## 2017-08-31 DIAGNOSIS — J449 Chronic obstructive pulmonary disease, unspecified: Secondary | ICD-10-CM | POA: Diagnosis not present

## 2017-08-31 DIAGNOSIS — E119 Type 2 diabetes mellitus without complications: Secondary | ICD-10-CM | POA: Diagnosis not present

## 2017-08-31 DIAGNOSIS — I509 Heart failure, unspecified: Secondary | ICD-10-CM | POA: Diagnosis not present

## 2017-09-01 ENCOUNTER — Ambulatory Visit (INDEPENDENT_AMBULATORY_CARE_PROVIDER_SITE_OTHER): Payer: Medicare HMO | Admitting: General Surgery

## 2017-09-01 ENCOUNTER — Encounter: Payer: Self-pay | Admitting: General Surgery

## 2017-09-01 VITALS — BP 135/60 | HR 89 | Temp 98.6°F | Ht 65.0 in | Wt 231.0 lb

## 2017-09-01 DIAGNOSIS — D126 Benign neoplasm of colon, unspecified: Secondary | ICD-10-CM | POA: Diagnosis not present

## 2017-09-01 MED ORDER — PEG 3350-KCL-NABCB-NACL-NASULF 236 G PO SOLR: 4000.0000 mL | Freq: Once | ORAL | 0 refills | Status: AC

## 2017-09-01 MED ORDER — NEOMYCIN SULFATE 500 MG PO TABS: 1000.0000 mg | ORAL_TABLET | Freq: Three times a day (TID) | ORAL | 0 refills | Status: DC

## 2017-09-01 MED ORDER — METRONIDAZOLE 500 MG PO TABS: 500.0000 mg | ORAL_TABLET | Freq: Three times a day (TID) | ORAL | 0 refills | Status: DC

## 2017-09-01 NOTE — Patient Instructions (Signed)
Open Colectomy An open colectomy is surgery to remove part or all of the large intestine (colon). This procedure may be used to treat several conditions, including:  Inflammation and infection of the colon (diverticulitis).  Tumors or masses in the colon.  Inflammatory bowel disease, such as Crohn disease or ulcerative colitis.  Bleeding from the colon.  Blockage or obstruction of the colon.  Tell a health care provider about:  Any allergies you have.  All medicines you are taking, including vitamins, herbs, eye drops, creams, and over-the-counter medicines.  Any problems you or family members have had with anesthetic medicines.  Any blood disorders you have.  Any surgeries you have had.  Any medical conditions you have.  Whether you are pregnant or may be pregnant.  Whether you smoke or use tobacco products. These can affect your body's reaction to anesthesia. What are the risks? Generally, this is a safe procedure. However, problems may occur, including:  Infection.  Bleeding.  Allergic reactions to medicines.  Damage to other structures or organs.  Pneumonia.  The incision opening up.  Tissues from inside the abdomen bulging through the incision (hernia).  Reopening of the colon where it was stitched or stapled together.  A blood clot forming in a vein and traveling to the lungs.  Future blockage of the small intestine from scar tissue.  What happens before the procedure? Staying hydrated Follow instructions from your health care provider about hydration, which may include:  Up to 2 hours before the procedure - you may continue to drink clear liquids, such as water, clear fruit juice, black coffee, and plain tea.  Eating and drinking restrictions Follow instructions from your health care provider about eating and drinking, which may include:  8 hours before the procedure - stop eating heavy meals or foods such as meat, fried foods, or fatty foods.  6  hours before the procedure - stop eating light meals or foods, such as toast or cereal.  6 hours before the procedure - stop drinking milk or drinks that contain milk.  2 hours before the procedure - stop drinking clear liquids.  Bowel prep In some cases, you may be prescribed an oral bowel prep to clean out your colon. If so:  Take it as told by your health care provider. Starting the day before your procedure, you may need to drink a large amount of medicated liquid. The liquid will cause you to have multiple loose stools until your stool is almost clear or light green.  Follow instructions from your health care provider about eating and drinking restrictions during bowel prep.  Medicines  Ask your health care provider about: ? Changing or stopping your regular medicines or vitamins. This is especially important if you are taking diabetes medicines, blood thinners, or vitamin E. ? Taking medicines such as aspirin and ibuprofen. These medicines can thin your blood. Do not take these medicines before your procedure if your health care provider instructs you not to.  If you were prescribed an antibiotic medicine, take it as told by your health care provider. General instructions  Bring loose-fitting, comfortable clothing and slip-on shoes that you can put on without bending over.  Make sure to see your health care provider for any tests that you need before the procedure, such as: ? Blood tests. ? A test to check the heart's rhythm (electrocardiogram, ECG). ? A CT scan of your abdomen. ? Urine tests. ? Colonoscopy.  Plan to have someone take you home from the   hospital or clinic.  Arrange for someone to help you with your activities during your recovery. What happens during the procedure?  To reduce your risk of infection: ? Your health care team will wash or sanitize their hands. ? Your skin will be washed with soap. ? Hair may be removed from the surgical area.  An IV tube  will be inserted into one of your veins. The tube will be used to give you medicines and fluids.  You will be given a medicine to make you fall asleep (general anesthetic). You may also be given a medicine to help you relax (sedative).  Small monitors will be connected to your body. They will be used to check your heart, blood pressure, and oxygen level.  A breathing tube may be placed into your lungs during the procedure.  A thin, flexible tube (catheter) will be placed into your bladder to drain urine.  A tube may be inserted through your nose and into your stomach (nasogastric tube, or NG tube). The tube is used to remove stomach fluids after surgery until the intestines start working again.  An incision will be made in your abdomen.  Clamps or staples will be put on your colon.  The part of the colon between the clamps or staples will be removed.  The ends of the colon that remain will be stitched or stapled together.  The incision in your abdomen will be closed with stitches (sutures) or staples.  The incision will be covered with a bandage (dressing).  A small opening (stoma) may be created in your lower abdomen. A removable, external pouch (ostomy pouch) will be attached to the stoma. This pouch will collect stool outside of your body. Stool passes through the stoma and into the pouch instead of through your anus. The procedure may vary among health care providers and hospitals. What happens after the procedure?  Your blood pressure, heart rate, breathing rate, and blood oxygen level will be monitored until the medicines you were given have worn off.  You may continue to receive fluids and medicines through an IV tube.  You will start on a clear liquid diet and gradually go back to a normal diet.  Do not drive until your health care provider approves.  You may have some pain in your abdomen. You will be given pain medicine to control the pain.  You will be encouraged to  do the following: ? Do breathing exercises to prevent pneumonia. ? Get up and start walking within a day after surgery. You should try to get up 5-6 times a day. This information is not intended to replace advice given to you by your health care provider. Make sure you discuss any questions you have with your health care provider. Document Released: 04/18/2009 Document Revised: 03/22/2016 Document Reviewed: 03/22/2016 Elsevier Interactive Patient Education  2018 Elsevier Inc[employer]  

## 2017-09-01 NOTE — Progress Notes (Signed)
Miranda Sexton; [id number]; [birth year]   HPI Patient is a 77 year old black female who was referred to my care by Dr. Laural Golden for evaluation and treatment of high-grade dysplasia that was seen on a biopsy of an ascending colon polyp.  It could not be removed fully endoscopically.  Patient at present time denies any abdominal pain.  She denies any blood per rectum.  There is no family history of colon cancer.  The colonoscopy was being performed for chronic anemia. Past Medical History:  Diagnosis Date  . Anxiety   . Asthma   . Carotid artery occlusion   . Chronic bronchitis ([place])   . Chronic diastolic heart failure ([place])   . CKD (chronic kidney disease) stage 2, GFR 60-89 ml/min   . COPD (chronic obstructive pulmonary disease) ([place])   . Coronary atherosclerosis of native coronary artery    Nonobstructive 08/2008  . Daily headache   . DJD (degenerative joint disease)   . Essential hypertension   . GERD (gastroesophageal reflux disease)   . Hyperlipidemia   . Obesity   . On home oxygen therapy   . PAD (peripheral artery disease) (H[facility])    Moderate right renal artery stenosis 08/2008  . Paroxysmal atrial fibrillation (HCC)   . Sick sinus syndrome (HCC)    Medtronic dual-chamber his bundle pacemaker - Dr. Rayann Heman  . Type 2 diabetes mellitus ([place])     Past Surgical History:  Procedure Laterality Date  . BIOPSY  08/03/2017   Procedure: BIOPSY;  Surgeon: [name], MD;  [facility];  Service: Endoscopy;;  gastric   . CARDIAC CATHETERIZATION  2014  . CATARACT EXTRACTION W/ INTRAOCULAR LENS  IMPLANT, BILATERAL Bilateral 2008  . COLONOSCOPY N/A 08/03/2017   Procedure: COLONOSCOPY;  Surgeon: [name], MD;  [facility];  Service: Endoscopy;  Laterality: N/A;  . ENDARTERECTOMY Right 12/08/2012   Procedure: ENDARTERECTOMY CAROTID;  Surgeon: [name], MD;  Location: [facility];  Service: Vascular;  Laterality: Right;  . EP IMPLANTABLE DEVICE N/A 03/02/2016   Procedure: Pacemaker Implant;  Surgeon: [name], MD;  Location: [facility];  Service: Cardiovascular;  Laterality: N/A;  . ESOPHAGOGASTRODUODENOSCOPY N/A 08/03/2017   Procedure: ESOPHAGOGASTRODUODENOSCOPY (EGD);  Surgeon: [name], MD;  [facility];  Service: Endoscopy;  Laterality: N/A;  . INSERT / REPLACE / REMOVE PACEMAKER  03/02/2016  . POLYPECTOMY  08/03/2017   Procedure: POLYPECTOMY;  Surgeon: [name], MD;  [facility];  Service: Endoscopy;;  colon    Family History  Problem Relation Age of Onset  . Cancer Mother        Stomach  . Deep vein thrombosis Mother   . Diabetes Mother   . Hypertension Mother   . Heart disease Mother   . Hypertension Father   . Diabetes Sister   . Hyperlipidemia Sister   . Hypertension Sister   . Heart disease Sister   . Diabetes Brother   . Hyperlipidemia Brother   . Hypertension Brother     Current Outpatient Medications on File Prior to Visit  Medication Sig Dispense Refill  . albuterol (PROAIR HFA) 108 (90 Base) MCG/ACT inhaler Inhale 2 puffs into the lungs every 6 (six) hours as needed for wheezing or shortness of breath.     Marland Kitchen atorvastatin (LIPITOR) 40 MG tablet Take 40 mg by mouth daily.    . benazepril (LOTENSIN) 40 MG tablet Take 40 mg by mouth daily.    Marland Kitchen  ELIQUIS 5 MG TABS tablet Take 1 tablet (5 mg total) by mouth 2 (two) times daily. 60 tablet 0  . flecainide (TAMBOCOR) 50 MG tablet Take 1 tablet (50 mg total) by mouth 2 (two) times daily. 180 tablet 3  . furosemide (LASIX) 40 MG tablet take 1 tablet by mouth once daily (Patient taking differently: Take 40 mg by mouth once daily) 15 tablet 0  . glipiZIDE (GLUCOTROL XL) 10 MG 24 hr tablet Take 10 mg by mouth 2 (two) times daily.     . insulin aspart protamine- aspart (NOVOLOG 70/30) (70-30) 100 UNIT/ML injection Inject 46-56 Units into the skin See admin instructions. Inject 56 units SQ in AM and 46 units SQ in PM    . meclizine  (ANTIVERT) 25 MG tablet take 12.5-25 mg by mouth three times a day if needed for dizziness  0  . metFORMIN (GLUCOPHAGE) 500 MG tablet Take 500 mg by mouth 2 (two) times daily.     . Multiple Vitamins-Minerals (CENTRUM SILVER PO) Take 1 tablet by mouth daily.    Marland Kitchen olopatadine (PATANOL) 0.1 % ophthalmic solution Place 1 drop into both eyes 2 (two) times daily.     . pantoprazole (PROTONIX) 40 MG tablet Take 1 tablet (40 mg total) by mouth 2 (two) times daily before a meal. 60 tablet 2  . potassium chloride SA (K-DUR,KLOR-CON) 20 MEQ tablet Take 20 mEq by mouth daily.    Marland Kitchen triamterene-hydrochlorothiazide (DYAZIDE) 37.5-25 MG capsule Take 1/2 tablet by mouth daily (Patient not taking: Reported on 07/25/2017)    . venlafaxine XR (EFFEXOR-XR) 150 MG 24 hr capsule Take 150 mg by mouth daily.     No current facility-administered medications on file prior to visit.     Allergies  Allergen Reactions  . Codeine Nausea And Vomiting  . Penicillins Rash and Other (See Comments)    Has patient had a PCN reaction causing immediate rash, facial/tongue/throat swelling, SOB or lightheadedness with hypotension: Yes Has patient had a PCN reaction causing severe rash involving mucus membranes or skin necrosis: No Has patient had a PCN reaction that required hospitalization No Has patient had a PCN reaction occurring within the last 10 years: No If all of the above answers are "NO", then may proceed with Cephalosporin use.     Social History   Substance and Sexual Activity  Alcohol Use No  . Alcohol/week: 0.0 oz    Social History   Tobacco Use  Smoking Status Never Smoker  Smokeless Tobacco Never Used    Review of Systems  Constitutional: Negative.   HENT: Negative.   Eyes: Negative.   Respiratory: Negative.   Cardiovascular: Negative.   Gastrointestinal: Negative.   Genitourinary: Negative.   Musculoskeletal: Negative.   Skin: Negative.   Neurological: Negative.   Endo/Heme/Allergies:  Negative.   Psychiatric/Behavioral: Negative.     Objective   Vitals:   09/01/17 1145  BP: 135/60  Pulse: 89  Temp: 98.6 F (37 C)    Physical Exam  Constitutional: She is oriented to person, place, and time and well-developed, well-nourished, and in no distress.  HENT:  Head: Normocephalic and atraumatic.  Cardiovascular: Normal rate, regular rhythm and normal heart sounds. Exam reveals no gallop and no friction rub.  No murmur heard. Pacemaker in place left upper chest.  Pulmonary/Chest: Effort normal and breath sounds normal. No respiratory distress. She has no wheezes. She has no rales.  Abdominal: Soft. Bowel sounds are normal. She exhibits no distension. There is no tenderness.  There is no rebound.  Neurological: She is alert and oriented to person, place, and time.  Skin: Skin is warm and dry.  Vitals reviewed.  Colonoscopy report reviewed Assessment  Dysplasia of ascending colon Plan   Patient is to see cardiology next week for preoperative clearance as she does have a pacemaker placed.  If cleared, will proceed with a partial colectomy.  The risks and benefits of the procedure including bleeding, infection, cardiopulmonary difficulties, anastomotic leak, and the possibility of a blood transfusion were fully explained to the patient, who gave informed consent.  GoLYTELY, neomycin, and Flagyl have been normal prescribed preoperatively for bowel preparation.  She will call to schedule the surgery.

## 2017-09-02 NOTE — Progress Notes (Signed)
Cardiology Office Note  Date: 09/05/2017   ID: Miranda Sexton, DOB Jun 28, 1941, MRN [id number]  PCP: Glenda Chroman, MD  Primary Cardiologist: Rozann Lesches, MD   Chief Complaint  Patient presents with  . PAF    History of Present Illness: Miranda Sexton is a 77 y.o. female last seen in June 2018.  She presents for a routine follow-up visit.  I reviewed her interval records.  She was hospitalized at [facility] back in December 2018 with GI bleed, hemoglobin down to 7.7 requiring PRBC transfusions.  She was taken off Eliquis temporarily and subsequently seen by Dr. Laural Golden.  Colonoscopy found a polyp within the ascending colon that was unable to be completely removed, biopsy revealing high-grade dysplasia.  She has been evaluated by Dr. Arnoldo Morale with plan for partial colectomy in the near future.  From a cardiac perspective she does not report any palpitations or chest pain.  I personally reviewed her ECG today which shows dual-chamber pacing with nonspecific T wave changes.  She continues to follow with Dr. Rayann Heman in the device clinic, Medtronic dual-chamber pacemaker with His bundle pacing in place.  Most recent device interrogation demonstrated 0.5% AT/AF.  She remains off Eliquis for now.  Additional therapies include Dyazide, flecainide, Lipitor, and Lotensin.  Past Medical History:  Diagnosis Date  . Anxiety   . Asthma   . Carotid artery occlusion   . Chronic bronchitis ([place])   . Chronic diastolic heart failure ([place])   . CKD (chronic kidney disease) stage 2, GFR 60-89 ml/min   . COPD (chronic obstructive pulmonary disease) ([place])   . Coronary atherosclerosis of native coronary artery    Nonobstructive 08/2008  . Daily headache   . DJD (degenerative joint disease)   . Essential hypertension   . GERD (gastroesophageal reflux disease)   . Hyperlipidemia   . Obesity   . On home oxygen therapy   . PAD (peripheral artery disease) (HCC)    Moderate right renal  artery stenosis 08/2008  . Paroxysmal atrial fibrillation (HCC)   . Sick sinus syndrome (HCC)    Medtronic dual-chamber his bundle pacemaker - Dr. Rayann Heman  . Type 2 diabetes mellitus ([place])     Past Surgical History:  Procedure Laterality Date  . BIOPSY  08/03/2017   Procedure: BIOPSY;  Surgeon: [name], MD;  [facility];  Service: Endoscopy;;  gastric   . CARDIAC CATHETERIZATION  2014  . CATARACT EXTRACTION W/ INTRAOCULAR LENS  IMPLANT, BILATERAL Bilateral 2008  . COLONOSCOPY N/A 08/03/2017   Procedure: COLONOSCOPY;  Surgeon: [name], MD;  [facility];  Service: Endoscopy;  Laterality: N/A;  . ENDARTERECTOMY Right 12/08/2012   Procedure: ENDARTERECTOMY CAROTID;  Surgeon: [name], MD;  Location: [facility];  Service: Vascular;  Laterality: Right;  . EP IMPLANTABLE DEVICE N/A 03/02/2016   Procedure: Pacemaker Implant;  Surgeon: [name], MD;  Location: [facility];  Service: Cardiovascular;  Laterality: N/A;  . ESOPHAGOGASTRODUODENOSCOPY N/A 08/03/2017   Procedure: ESOPHAGOGASTRODUODENOSCOPY (EGD);  Surgeon: [name], MD;  [facility];  Service: Endoscopy;  Laterality: N/A;  . INSERT / REPLACE / REMOVE PACEMAKER  03/02/2016  . POLYPECTOMY  08/03/2017   Procedure: POLYPECTOMY;  Surgeon: [name], MD;  [facility];  Service: Endoscopy;;  colon    Current Outpatient Medications  Medication Sig Dispense Refill  . albuterol (PROAIR HFA) 108 (90 Base) MCG/ACT inhaler Inhale 2  puffs into the lungs every 6 (six) hours as needed for wheezing or shortness of breath.     Marland Kitchen atorvastatin (LIPITOR) 40 MG tablet Take 40 mg by mouth daily.    . benazepril (LOTENSIN) 40 MG tablet Take 40 mg by mouth daily.    Marland Kitchen ELIQUIS 5 MG TABS tablet Take 1 tablet (5 mg total) by mouth 2 (two) times daily. 60 tablet 0  . flecainide (TAMBOCOR) 50 MG tablet Take 1 tablet (50 mg total) by mouth 2 (two) times daily. 180 tablet 3  .  furosemide (LASIX) 40 MG tablet take 1 tablet by mouth once daily (Patient taking differently: Take 40 mg by mouth once daily) 15 tablet 0  . glipiZIDE (GLUCOTROL XL) 10 MG 24 hr tablet Take 10 mg by mouth 2 (two) times daily.     . insulin aspart protamine- aspart (NOVOLOG 70/30) (70-30) 100 UNIT/ML injection Inject 46-56 Units into the skin See admin instructions. Inject 56 units SQ in AM and 46 units SQ in PM    . meclizine (ANTIVERT) 25 MG tablet take 12.5-25 mg by mouth three times a day if needed for dizziness  0  . metFORMIN (GLUCOPHAGE) 500 MG tablet Take 500 mg by mouth 2 (two) times daily.     . metroNIDAZOLE (FLAGYL) 500 MG tablet Take 1 tablet (500 mg total) by mouth 3 (three) times daily. Take one tablet by mouth at 1pm, 2pm, and 9pm the day before surgery 3 tablet 0  . Multiple Vitamins-Minerals (CENTRUM SILVER PO) Take 1 tablet by mouth daily.    Marland Kitchen neomycin (MYCIFRADIN) 500 MG tablet Take 2 tablets (1,000 mg total) by mouth 3 (three) times daily. Take two tablets by mouth at 1pm, 2pm, and 9pm the day before surgery 6 tablet 0  . olopatadine (PATANOL) 0.1 % ophthalmic solution Place 1 drop into both eyes 2 (two) times daily.     . pantoprazole (PROTONIX) 40 MG tablet Take 1 tablet (40 mg total) by mouth 2 (two) times daily before a meal. 60 tablet 2  . potassium chloride SA (K-DUR,KLOR-CON) 20 MEQ tablet Take 20 mEq by mouth daily.    Marland Kitchen triamterene-hydrochlorothiazide (DYAZIDE) 37.5-25 MG capsule Take 1/2 tablet by mouth daily    . venlafaxine XR (EFFEXOR-XR) 150 MG 24 hr capsule Take 150 mg by mouth daily.     No current facility-administered medications for this visit.    Allergies:  Codeine and Penicillins   Social History: The patient  reports that  has never smoked. she has never used smokeless tobacco. She reports that she does not drink alcohol or use drugs.   ROS:  Please see the history of present illness. Otherwise, complete review of systems is positive for fatigue.  All  other systems are reviewed and negative.   Physical Exam: VS:  BP (!) 146/67   Pulse (!) 55   Ht 5\' 5"  (1.651 m)   Wt 229 lb (103.9 kg)   SpO2 97%   BMI 38.11 kg/m , BMI Body mass index is 38.11 kg/m.  Wt Readings from Last 3 Encounters:  09/05/17 229 lb (103.9 kg)  09/01/17 231 lb (104.8 kg)  08/03/17 223 lb (101.2 kg)    General: Patient appears comfortable at rest. HEENT: Conjunctiva and lids normal, oropharynx clear. Neck: Supple, no elevated JVP or carotid bruits, no thyromegaly. Lungs: Clear to auscultation, nonlabored breathing at rest. Cardiac: Regular rate and rhythm, no S3, soft systolic murmur. Abdomen: Soft, nontender, bowel sounds present. Extremities: No  pitting edema, distal pulses 2+. Skin: Warm and dry. Musculoskeletal: No kyphosis. Neuropsychiatric: Alert and oriented x3, affect grossly appropriate.  ECG: I personally reviewed the tracing from 09/10/2016 which showed His bundle paced rhythm.  Recent Labwork: 06/21/2017: Hemoglobin 11.1; Platelets 329  November 2018: BUN 19, creatinine 0.91, potassium 4.2  Other Studies Reviewed Today:  Echocardiogram 03/02/2016: Study Conclusions  - Left ventricle: The cavity size was normal. Wall thickness was   increased in a pattern of mild LVH. Systolic function was normal.   The estimated ejection fraction was in the range of 55% to 60%.   Wall motion was normal; there were no regional wall motion   abnormalities. - Aortic valve: Valve area (VTI): 1.37 cm^2. Valve area (Vmax):   1.32 cm^2. Valve area (Vmean): 1.5 cm^2. - Mitral valve: There was mild regurgitation. - Right atrium: The atrium was mildly dilated.  Assessment and Plan:  1.  Preoperative evaluation in a 77 year old woman with paroxysmal atrial fibrillation, sick sinus syndrome status post Medtronic His bundle pacemaker, chronic diastolic heart failure, now being considered for partial colectomy under general anesthesia.  She has been stable from a  cardiac perspective, no recent decompensated heart failure, and on stable cardiac regimen with the exception of Eliquis which has been on hold.  ECG reviewed today.  I would not anticipate any further cardiac testing at this time.  She should be able to proceed with planned surgery.  2.  Paroxysmal atrial fibrillation, overall well controlled on flecainide.  3.  Carotid artery disease, followed by [facility].  Carotid Dopplers from 2017 showed patent right CEA site with 38-33% LICA stenosis.  4.  Essential hypertension, on Lotensin and Dyazide.  She follows with Dr. Woody Seller.  Current medicines were reviewed with the patient today.   Orders Placed This Encounter  Procedures  . EKG 12-Lead    Disposition: Follow-up in 6 months.  Signed, Satira Sark, MD, [facility] 09/05/2017 10:11 AM    [facility] [place], [place], [place] [address] Phone: [phone]; Fax: [phone]

## 2017-09-05 ENCOUNTER — Encounter: Payer: Self-pay | Admitting: Cardiology

## 2017-09-05 ENCOUNTER — Ambulatory Visit (INDEPENDENT_AMBULATORY_CARE_PROVIDER_SITE_OTHER): Payer: Medicare HMO | Admitting: Cardiology

## 2017-09-05 ENCOUNTER — Other Ambulatory Visit: Payer: Self-pay

## 2017-09-05 VITALS — BP 146/67 | HR 55 | Ht 65.0 in | Wt 229.0 lb

## 2017-09-05 DIAGNOSIS — I10 Essential (primary) hypertension: Secondary | ICD-10-CM | POA: Diagnosis not present

## 2017-09-05 DIAGNOSIS — Z01810 Encounter for preprocedural cardiovascular examination: Secondary | ICD-10-CM

## 2017-09-05 DIAGNOSIS — I480 Paroxysmal atrial fibrillation: Secondary | ICD-10-CM | POA: Diagnosis not present

## 2017-09-05 DIAGNOSIS — I495 Sick sinus syndrome: Secondary | ICD-10-CM

## 2017-09-05 DIAGNOSIS — I6523 Occlusion and stenosis of bilateral carotid arteries: Secondary | ICD-10-CM

## 2017-09-05 NOTE — Patient Instructions (Signed)

## 2017-09-05 NOTE — Addendum Note (Signed)
Addended by: Acquanetta Chain on: 09/05/2017 01:09 PM   Modules accepted: Orders

## 2017-09-13 NOTE — H&P (Signed)
Miranda Sexton; [id number]; [birth year]   HPI Patient is a 77 year old black female who was referred to my care by Dr. Laural Golden for evaluation and treatment of high-grade dysplasia that was seen on a biopsy of an ascending colon polyp.  It could not be removed fully endoscopically.  Patient at present time denies any abdominal pain.  She denies any blood per rectum.  There is no family history of colon cancer.  The colonoscopy was being performed for chronic anemia. Past Medical History:  Diagnosis Date  . Anxiety   . Asthma   . Carotid artery occlusion   . Chronic bronchitis ([place])   . Chronic diastolic heart failure ([place])   . CKD (chronic kidney disease) stage 2, GFR 60-89 ml/min   . COPD (chronic obstructive pulmonary disease) ([place])   . Coronary atherosclerosis of native coronary artery    Nonobstructive 08/2008  . Daily headache   . DJD (degenerative joint disease)   . Essential hypertension   . GERD (gastroesophageal reflux disease)   . Hyperlipidemia   . Obesity   . On home oxygen therapy   . PAD (peripheral artery disease) ([facility])    Moderate right renal artery stenosis 08/2008  . Paroxysmal atrial fibrillation ([facility])   . Sick sinus syndrome (H[facility])    Medtronic dual-chamber his bundle pacemaker - Dr. Rayann Heman  . Type 2 diabetes mellitus ([place])     Past Surgical History:  Procedure Laterality Date  . BIOPSY  08/03/2017   Procedure: BIOPSY;  Surgeon: [name], MD;  [facility]: [facility];  Service: Endoscopy;;  gastric   . CARDIAC CATHETERIZATION  2014  . CATARACT EXTRACTION W/ INTRAOCULAR LENS  IMPLANT, BILATERAL Bilateral 2008  . COLONOSCOPY N/A 08/03/2017   Procedure: COLONOSCOPY;  Surgeon: [name], MD;  [facility]: [facility];  Service: Endoscopy;  Laterality: N/A;  . ENDARTERECTOMY Right 12/08/2012   Procedure: ENDARTERECTOMY CAROTID;  Surgeon: [name], MD;  Location: [facility];  Service: Vascular;  Laterality: Right;  . EP IMPLANTABLE DEVICE N/A 03/02/2016   Procedure: Pacemaker Implant;  Surgeon: [name], MD;  Location: [facility];  Service: Cardiovascular;  Laterality: N/A;  . ESOPHAGOGASTRODUODENOSCOPY N/A 08/03/2017   Procedure: ESOPHAGOGASTRODUODENOSCOPY (EGD);  Surgeon: [name], MD;  [facility];  Service: Endoscopy;  Laterality: N/A;  . INSERT / REPLACE / REMOVE PACEMAKER  03/02/2016  . POLYPECTOMY  08/03/2017   Procedure: POLYPECTOMY;  Surgeon: [name], MD;  [facility]: [facility];  Service: Endoscopy;;  colon    Family History  Problem Relation Age of Onset  . Cancer Mother        Stomach  . Deep vein thrombosis Mother   . Diabetes Mother   . Hypertension Mother   . Heart disease Mother   . Hypertension Father   . Diabetes Sister   . Hyperlipidemia Sister   . Hypertension Sister   . Heart disease Sister   . Diabetes Brother   . Hyperlipidemia Brother   . Hypertension Brother     Current Outpatient Medications on File Prior to Visit  Medication Sig Dispense Refill  . albuterol (PROAIR HFA) 108 (90 Base) MCG/ACT inhaler Inhale 2 puffs into the lungs every 6 (six) hours as needed for wheezing or shortness of breath.     Marland Kitchen atorvastatin (LIPITOR) 40 MG tablet Take 40 mg by mouth daily.    . benazepril (LOTENSIN) 40 MG tablet Take 40 mg by mouth daily.    Marland Kitchen  ELIQUIS 5 MG TABS tablet Take 1 tablet (5 mg total) by mouth 2 (two) times daily. 60 tablet 0  . flecainide (TAMBOCOR) 50 MG tablet Take 1 tablet (50 mg total) by mouth 2 (two) times daily. 180 tablet 3  . furosemide (LASIX) 40 MG tablet take 1 tablet by mouth once daily (Patient taking differently: Take 40 mg by mouth once daily) 15 tablet 0  . glipiZIDE (GLUCOTROL XL) 10 MG 24 hr tablet Take 10 mg by mouth 2 (two) times daily.     . insulin aspart protamine- aspart (NOVOLOG 70/30) (70-30) 100 UNIT/ML injection Inject 46-56 Units into the skin See admin instructions. Inject 56 units SQ in AM and 46 units SQ in PM    . meclizine  (ANTIVERT) 25 MG tablet take 12.5-25 mg by mouth three times a day if needed for dizziness  0  . metFORMIN (GLUCOPHAGE) 500 MG tablet Take 500 mg by mouth 2 (two) times daily.     . Multiple Vitamins-Minerals (CENTRUM SILVER PO) Take 1 tablet by mouth daily.    Marland Kitchen olopatadine (PATANOL) 0.1 % ophthalmic solution Place 1 drop into both eyes 2 (two) times daily.     . pantoprazole (PROTONIX) 40 MG tablet Take 1 tablet (40 mg total) by mouth 2 (two) times daily before a meal. 60 tablet 2  . potassium chloride SA (K-DUR,KLOR-CON) 20 MEQ tablet Take 20 mEq by mouth daily.    Marland Kitchen triamterene-hydrochlorothiazide (DYAZIDE) 37.5-25 MG capsule Take 1/2 tablet by mouth daily (Patient not taking: Reported on 07/25/2017)    . venlafaxine XR (EFFEXOR-XR) 150 MG 24 hr capsule Take 150 mg by mouth daily.     No current facility-administered medications on file prior to visit.     Allergies  Allergen Reactions  . Codeine Nausea And Vomiting  . Penicillins Rash and Other (See Comments)    Has patient had a PCN reaction causing immediate rash, facial/tongue/throat swelling, SOB or lightheadedness with hypotension: Yes Has patient had a PCN reaction causing severe rash involving mucus membranes or skin necrosis: No Has patient had a PCN reaction that required hospitalization No Has patient had a PCN reaction occurring within the last 10 years: No If all of the above answers are "NO", then may proceed with Cephalosporin use.     Social History   Substance and Sexual Activity  Alcohol Use No  . Alcohol/week: 0.0 oz    Social History   Tobacco Use  Smoking Status Never Smoker  Smokeless Tobacco Never Used    Review of Systems  Constitutional: Negative.   HENT: Negative.   Eyes: Negative.   Respiratory: Negative.   Cardiovascular: Negative.   Gastrointestinal: Negative.   Genitourinary: Negative.   Musculoskeletal: Negative.   Skin: Negative.   Neurological: Negative.   Endo/Heme/Allergies:  Negative.   Psychiatric/Behavioral: Negative.     Objective   Vitals:   09/01/17 1145  BP: 135/60  Pulse: 89  Temp: 98.6 F (37 C)    Physical Exam  Constitutional: She is oriented to person, place, and time and well-developed, well-nourished, and in no distress.  HENT:  Head: Normocephalic and atraumatic.  Cardiovascular: Normal rate, regular rhythm and normal heart sounds. Exam reveals no gallop and no friction rub.  No murmur heard. Pacemaker in place left upper chest.  Pulmonary/Chest: Effort normal and breath sounds normal. No respiratory distress. She has no wheezes. She has no rales.  Abdominal: Soft. Bowel sounds are normal. She exhibits no distension. There is no tenderness.  There is no rebound.  Neurological: She is alert and oriented to person, place, and time.  Skin: Skin is warm and dry.  Vitals reviewed.  Colonoscopy report reviewed Assessment  Dysplasia of ascending colon Plan   Patient is to see cardiology next week for preoperative clearance as she does have a pacemaker placed.  If cleared, will proceed with a partial colectomy.  The risks and benefits of the procedure including bleeding, infection, cardiopulmonary difficulties, anastomotic leak, and the possibility of a blood transfusion were fully explained to the patient, who gave informed consent.  GoLYTELY, neomycin, and Flagyl have been normal prescribed preoperatively for bowel preparation.  She will call to schedule the surgery.

## 2017-09-22 NOTE — Patient Instructions (Signed)
Miranda Sexton  09/22/2017     @PREFPERIOPPHARMACY @   Your procedure is scheduled on  09/28/2017   Report to [facility] at  645   A.M.  Call this number if you have problems the morning of surgery:  [phone]   Remember:  Do not eat food or drink liquids after midnight.  Take these medicines the morning of surgery with A SIP OF WATER  Benazepril, flecanide, antivert, protonix, effexor. Use your inhaler before you come and bring it with you. Take 22 units of your 70/30 insulin the night before your surgery. DO NOT take any medications for diabetes the morning of your surgery.  FOLLOW ANY INSTRUCTIONS given to you by Dr Arnoldo Morale concerning your eliquis.   Do not wear jewelry, make-up or nail polish.  Do not wear lotions, powders, or perfumes, or deodorant.  Do not shave 48 hours prior to surgery.  Men may shave face and neck.  Do not bring valuables to the hospital.  [facility] is not responsible for any belongings or valuables.  Contacts, dentures or bridgework may not be worn into surgery.  Leave your suitcase in the car.  After surgery it may be brought to your room.  For patients admitted to the hospital, discharge time will be determined by your treatment team.  Patients discharged the day of surgery will not be allowed to drive home.   Name and phone number of your driver:   family Special instructions:  Follow any diet or prep instructions given yo you by Dr Arnoldo Morale.  Please read over the following fact sheets that you were given. Pain Booklet, Coughing and Deep Breathing, Blood Transfusion Information, Surgical Site Infection Prevention, Anesthesia Post-op Instructions and Care and Recovery After Surgery       Open Colectomy An open colectomy is surgery to remove part or all of the large intestine (colon). This procedure may be used to treat several conditions, including:  Inflammation and infection of the colon (diverticulitis).  Tumors or masses  in the colon.  Inflammatory bowel disease, such as Crohn disease or ulcerative colitis.  Bleeding from the colon.  Blockage or obstruction of the colon.  Tell a health care provider about:  Any allergies you have.  All medicines you are taking, including vitamins, herbs, eye drops, creams, and over-the-counter medicines.  Any problems you or family members have had with anesthetic medicines.  Any blood disorders you have.  Any surgeries you have had.  Any medical conditions you have.  Whether you are pregnant or may be pregnant.  Whether you smoke or use tobacco products. These can affect your body's reaction to anesthesia. What are the risks? Generally, this is a safe procedure. However, problems may occur, including:  Infection.  Bleeding.  Allergic reactions to medicines.  Damage to other structures or organs.  Pneumonia.  The incision opening up.  Tissues from inside the abdomen bulging through the incision (hernia).  Reopening of the colon where it was stitched or stapled together.  A blood clot forming in a vein and traveling to the lungs.  Future blockage of the small intestine from scar tissue.  What happens before the procedure? Staying hydrated Follow instructions from your health care provider about hydration, which may include:  Up to 2 hours before the procedure - you may continue to drink clear liquids, such as water, clear fruit juice, black coffee, and plain tea.  Eating and drinking restrictions Follow  instructions from your health care provider about eating and drinking, which may include:  8 hours before the procedure - stop eating heavy meals or foods such as meat, fried foods, or fatty foods.  6 hours before the procedure - stop eating light meals or foods, such as toast or cereal.  6 hours before the procedure - stop drinking milk or drinks that contain milk.  2 hours before the procedure - stop drinking clear liquids.  Bowel  prep In some cases, you may be prescribed an oral bowel prep to clean out your colon. If so:  Take it as told by your health care provider. Starting the day before your procedure, you may need to drink a large amount of medicated liquid. The liquid will cause you to have multiple loose stools until your stool is almost clear or light green.  Follow instructions from your health care provider about eating and drinking restrictions during bowel prep.  Medicines  Ask your health care provider about: ? Changing or stopping your regular medicines or vitamins. This is especially important if you are taking diabetes medicines, blood thinners, or vitamin E. ? Taking medicines such as aspirin and ibuprofen. These medicines can thin your blood. Do not take these medicines before your procedure if your health care provider instructs you not to.  If you were prescribed an antibiotic medicine, take it as told by your health care provider. General instructions  Bring loose-fitting, comfortable clothing and slip-on shoes that you can put on without bending over.  Make sure to see your health care provider for any tests that you need before the procedure, such as: ? Blood tests. ? A test to check the heart's rhythm (electrocardiogram, ECG). ? A CT scan of your abdomen. ? Urine tests. ? Colonoscopy.  Plan to have someone take you home from the hospital or clinic.  Arrange for someone to help you with your activities during your recovery. What happens during the procedure?  To reduce your risk of infection: ? Your health care team will wash or sanitize their hands. ? Your skin will be washed with soap. ? Hair may be removed from the surgical area.  An IV tube will be inserted into one of your veins. The tube will be used to give you medicines and fluids.  You will be given a medicine to make you fall asleep (general anesthetic). You may also be given a medicine to help you relax  (sedative).  Small monitors will be connected to your body. They will be used to check your heart, blood pressure, and oxygen level.  A breathing tube may be placed into your lungs during the procedure.  A thin, flexible tube (catheter) will be placed into your bladder to drain urine.  A tube may be inserted through your nose and into your stomach (nasogastric tube, or NG tube). The tube is used to remove stomach fluids after surgery until the intestines start working again.  An incision will be made in your abdomen.  Clamps or staples will be put on your colon.  The part of the colon between the clamps or staples will be removed.  The ends of the colon that remain will be stitched or stapled together.  The incision in your abdomen will be closed with stitches (sutures) or staples.  The incision will be covered with a bandage (dressing).  A small opening (stoma) may be created in your lower abdomen. A removable, external pouch (ostomy pouch) will be attached to the  stoma. This pouch will collect stool outside of your body. Stool passes through the stoma and into the pouch instead of through your anus. The procedure may vary among health care providers and hospitals. What happens after the procedure?  Your blood pressure, heart rate, breathing rate, and blood oxygen level will be monitored until the medicines you were given have worn off.  You may continue to receive fluids and medicines through an IV tube.  You will start on a clear liquid diet and gradually go back to a normal diet.  Do not drive until your health care provider approves.  You may have some pain in your abdomen. You will be given pain medicine to control the pain.  You will be encouraged to do the following: ? Do breathing exercises to prevent pneumonia. ? Get up and start walking within a day after surgery. You should try to get up 5-6 times a day. This information is not intended to replace advice given to  you by your health care provider. Make sure you discuss any questions you have with your health care provider. Document Released: 04/18/2009 Document Revised: 03/22/2016 Document Reviewed: 03/22/2016 Elsevier Interactive Patient Education  2018 [employer].  Open Colectomy, Care After This sheet gives you information about how to care for yourself after your procedure. Your health care provider may also give you more specific instructions. If you have problems or questions, contact your health care provider. What can I expect after the procedure? After the procedure, it is common to have:  Pain in your abdomen, especially along your incision.  Tiredness. Your energy level will return to normal over the next several weeks.  Constipation.  Nausea.  Difficulty urinating.  Follow these instructions at home: Activity  You may be able to return to most of your normal activities within 1-2 weeks, such as working, walking up stairs, and sexual activity.  Avoid activities that require a lot of energy for 4-6 weeks after surgery, such as running, climbing, and lifting heavy objects. Ask your health care provider what activities are safe for you.  Take rest breaks during the day as needed.  Do not drive for 1-2 weeks or until your health care provider says that it is safe.  Do not drive or use heavy machinery while taking prescription pain medicines.  Do not lift anything that is heavier than 10 lb (4.3 kg) until your health care provider says that it is safe. Incision care  Follow instructions from your health care provider about how to take care of your incision. Make sure you: ? Wash your hands with soap and water before you change your bandage (dressing). If soap and water are not available, use hand sanitizer. ? Change your dressing as told by your health care provider. ? Leave stitches (sutures) or staples in place. These skin closures may need to stay in place for 2 weeks or  longer.  Avoid wearing tight clothing around your incision.  Protect your incision area from the sun.  Check your incision area every day for signs of infection. Check for: ? More redness, swelling, or pain. ? More fluid or blood. ? Warmth. ? Pus or a bad smell. General instructions  Do not take baths, swim, or use a hot tub until your health care provider approves. Ask your health care provider when you may shower.  Take over-the-counter and prescription medicines, including stool softeners, only as told by your health care provider.  Eat a low-fat and low-fiber diet for  the first 4 weeks after surgery.  Keep all follow-up visits as told by your health care provider. This is important. Contact a health care provider if:  You have more redness, swelling, or pain around your incision.  You have more fluid or blood coming from your incision.  Your incision feels warm to the touch.  You have pus or a bad smell coming from your incision.  You have a fever or chills.  You do not have a bowel movement 2-3 days after surgery.  You cannot eat or drink for 24 hours or more.  You have persistent nausea and vomiting.  You have abdominal pain that gets worse and does not get better with medicine. Get help right away if:  You have chest pain.  You have shortness of breath.  You have pain or swelling in your legs.  Your incision breaks open after your sutures or staples have been removed.  You have bleeding from the rectum. This information is not intended to replace advice given to you by your health care provider. Make sure you discuss any questions you have with your health care provider. Document Released: 01/12/2011 Document Revised: 03/22/2016 Document Reviewed: 03/22/2016 Elsevier Interactive Patient Education  2018 [place] Anesthesia, Adult General anesthesia is the use of medicines to make a person "go to sleep" (be unconscious) for a medical  procedure. General anesthesia is often recommended when a procedure:  Is long.  Requires you to be still or in an unusual position.  Is major and can cause you to lose blood.  Is impossible to do without general anesthesia.  The medicines used for general anesthesia are called general anesthetics. In addition to making you sleep, the medicines:  Prevent pain.  Control your blood pressure.  Relax your muscles.  Tell a health care provider about:  Any allergies you have.  All medicines you are taking, including vitamins, herbs, eye drops, creams, and over-the-counter medicines.  Any problems you or family members have had with anesthetic medicines.  Types of anesthetics you have had in the past.  Any bleeding disorders you have.  Any surgeries you have had.  Any medical conditions you have.  Any history of heart or lung conditions, such as heart failure, sleep apnea, or chronic obstructive pulmonary disease (COPD).  Whether you are pregnant or may be pregnant.  Whether you use tobacco, alcohol, marijuana, or street drugs.  Any history of Armed forces logistics/support/administrative officer.  Any history of depression or anxiety. What are the risks? Generally, this is a safe procedure. However, problems may occur, including:  Allergic reaction to anesthetics.  Lung and heart problems.  Inhaling food or liquids from your stomach into your lungs (aspiration).  Injury to nerves.  Waking up during your procedure and being unable to move (rare).  Extreme agitation or a state of mental confusion (delirium) when you wake up from the anesthetic.  Air in the bloodstream, which can lead to stroke.  These problems are more likely to develop if you are having a major surgery or if you have an advanced medical condition. You can prevent some of these complications by answering all of your health care provider's questions thoroughly and by following all pre-procedure instructions. General anesthesia can  cause side effects, including:  Nausea or vomiting  A sore throat from the breathing tube.  Feeling cold or shivery.  Feeling tired, washed out, or achy.  Sleepiness or drowsiness.  Confusion or agitation.  What happens before the procedure? Staying  hydrated Follow instructions from your health care provider about hydration, which may include:  Up to 2 hours before the procedure - you may continue to drink clear liquids, such as water, clear fruit juice, black coffee, and plain tea.  Eating and drinking restrictions Follow instructions from your health care provider about eating and drinking, which may include:  8 hours before the procedure - stop eating heavy meals or foods such as meat, fried foods, or fatty foods.  6 hours before the procedure - stop eating light meals or foods, such as toast or cereal.  6 hours before the procedure - stop drinking milk or drinks that contain milk.  2 hours before the procedure - stop drinking clear liquids.  Medicines  Ask your health care provider about: ? Changing or stopping your regular medicines. This is especially important if you are taking diabetes medicines or blood thinners. ? Taking medicines such as aspirin and ibuprofen. These medicines can thin your blood. Do not take these medicines before your procedure if your health care provider instructs you not to. ? Taking new dietary supplements or medicines. Do not take these during the week before your procedure unless your health care provider approves them.  If you are told to take a medicine or to continue taking a medicine on the day of the procedure, take the medicine with sips of water. General instructions   Ask if you will be going home the same day, the following day, or after a longer hospital stay. ? Plan to have someone take you home. ? Plan to have someone stay with you for the first 24 hours after you leave the hospital or clinic.  For 3-6 weeks before the  procedure, try not to use any tobacco products, such as cigarettes, chewing tobacco, and e-cigarettes.  You may brush your teeth on the morning of the procedure, but make sure to spit out the toothpaste. What happens during the procedure?  You will be given anesthetics through a mask and through an IV tube in one of your veins.  You may receive medicine to help you relax (sedative).  As soon as you are asleep, a breathing tube may be used to help you breathe.  An anesthesia specialist will stay with you throughout the procedure. He or she will help keep you comfortable and safe by continuing to give you medicines and adjusting the amount of medicine that you get. He or she will also watch your blood pressure, pulse, and oxygen levels to make sure that the anesthetics do not cause any problems.  If a breathing tube was used to help you breathe, it will be removed before you wake up. The procedure may vary among health care providers and hospitals. What happens after the procedure?  You will wake up, often slowly, after the procedure is complete, usually in a recovery area.  Your blood pressure, heart rate, breathing rate, and blood oxygen level will be monitored until the medicines you were given have worn off.  You may be given medicine to help you calm down if you feel anxious or agitated.  If you will be going home the same day, your health care provider may check to make sure you can stand, drink, and urinate.  Your health care providers will treat your pain and side effects before you go home.  Do not drive for 24 hours if you received a sedative.  You may: ? Feel nauseous and vomit. ? Have a sore throat. ? Have  mental slowness. ? Feel cold or shivery. ? Feel sleepy. ? Feel tired. ? Feel sore or achy, even in parts of your body where you did not have surgery. This information is not intended to replace advice given to you by your health care provider. Make sure you discuss  any questions you have with your health care provider. Document Released: 09/28/2007 Document Revised: 12/02/2015 Document Reviewed: 06/05/2015 Elsevier Interactive Patient Education  2018 [place] Anesthesia, Adult, Care After These instructions provide you with information about caring for yourself after your procedure. Your health care provider may also give you more specific instructions. Your treatment has been planned according to current medical practices, but problems sometimes occur. Call your health care provider if you have any problems or questions after your procedure. What can I expect after the procedure? After the procedure, it is common to have:  Vomiting.  A sore throat.  Mental slowness.  It is common to feel:  Nauseous.  Cold or shivery.  Sleepy.  Tired.  Sore or achy, even in parts of your body where you did not have surgery.  Follow these instructions at home: For at least 24 hours after the procedure:  Do not: ? Participate in activities where you could fall or become injured. ? Drive. ? Use heavy machinery. ? Drink alcohol. ? Take sleeping pills or medicines that cause drowsiness. ? Make important decisions or sign legal documents. ? Take care of children on your own.  Rest. Eating and drinking  If you vomit, drink water, juice, or soup when you can drink without vomiting.  Drink enough fluid to keep your urine clear or pale yellow.  Make sure you have little or no nausea before eating solid foods.  Follow the diet recommended by your health care provider. General instructions  Have a responsible adult stay with you until you are awake and alert.  Return to your normal activities as told by your health care provider. Ask your health care provider what activities are safe for you.  Take over-the-counter and prescription medicines only as told by your health care provider.  If you smoke, do not smoke without  supervision.  Keep all follow-up visits as told by your health care provider. This is important. Contact a health care provider if:  You continue to have nausea or vomiting at home, and medicines are not helpful.  You cannot drink fluids or start eating again.  You cannot urinate after 8-12 hours.  You develop a skin rash.  You have fever.  You have increasing redness at the site of your procedure. Get help right away if:  You have difficulty breathing.  You have chest pain.  You have unexpected bleeding.  You feel that you are having a life-threatening or urgent problem. This information is not intended to replace advice given to you by your health care provider. Make sure you discuss any questions you have with your health care provider. Document Released: 09/27/2000 Document Revised: 11/24/2015 Document Reviewed: 06/05/2015 Elsevier Interactive Patient Education  [employer].

## 2017-09-23 ENCOUNTER — Encounter (HOSPITAL_COMMUNITY)
Admission: RE | Admit: 2017-09-23 | Discharge: 2017-09-23 | Disposition: A | Discharge status: Home / Self Care | Payer: Medicare HMO | Source: Ambulatory Visit | Attending: General Surgery | Admitting: General Surgery

## 2017-10-12 NOTE — Patient Instructions (Addendum)
Miranda Sexton  10/12/2017     @PREFPERIOPPHARMACY @   Your procedure is scheduled on 10/19/2017.  Report to [facility] at 6:15 A.M.  Call this number if you have problems the morning of surgery:  [phone]   Remember:  Do not eat food or drink liquids after midnight.  Take these medicines the morning of surgery with A SIP OF WATER Albuterol inhaler, Lotensin, Flecainide, Protonix, Effexor, Antivert if needed   Take only 70% of your night dose of insulin on 4/16.  No insulin or diabetic medications by mouth am of surgery   Do not wear jewelry, make-up or nail polish.  Do not wear lotions, powders, or perfumes, or deodorant.  Do not shave 48 hours prior to surgery.  Men may shave face and neck.  Do not bring valuables to the hospital.  [facility] is not responsible for any belongings or valuables.  Contacts, dentures or bridgework may not be worn into surgery.  Leave your suitcase in the car.  After surgery it may be brought to your room.  For patients admitted to the hospital, discharge time will be determined by your treatment team.  Patients discharged the day of surgery will not be allowed to drive home.    Please read over the following fact sheets that you were given. Surgical Site Infection Prevention, Anesthesia Post-op Instructions and Care and Recovery After Surgery     PATIENT INSTRUCTIONS POST-ANESTHESIA  IMMEDIATELY FOLLOWING SURGERY:  Do not drive or operate machinery for the first twenty four hours after surgery.  Do not make any important decisions for twenty four hours after surgery or while taking narcotic pain medications or sedatives.  If you develop intractable nausea and vomiting or a severe headache please notify your doctor immediately.  FOLLOW-UP:  Please make an appointment with your surgeon as instructed. You do not need to follow up with anesthesia unless specifically instructed to do so.  WOUND CARE INSTRUCTIONS (if applicable):  Keep a dry  clean dressing on the anesthesia/puncture wound site if there is drainage.  Once the wound has quit draining you may leave it open to air.  Generally you should leave the bandage intact for twenty four hours unless there is drainage.  If the epidural site drains for more than 36-48 hours please call the anesthesia department.  QUESTIONS?:  Please feel free to call your physician or the hospital operator if you have any questions, and they will be happy to assist you.      Laparoscopic Colectomy Laparoscopic colectomy is surgery to remove part or all of the large intestine (colon). This procedure may be used to treat several conditions, including:  Inflammation and infection of the colon (diverticulitis).  Tumors or masses in the colon.  Inflammatory bowel disease, such as Crohn disease or ulcerative colitis. Colectomy is an option when symptoms cannot be controlled with medicines.  Bleeding from the colon that cannot be controlled by another method.  Blockage or obstruction of the colon.  Tell a health care provider about:  Any allergies you have.  All medicines you are taking, including vitamins, herbs, eye drops, creams, and over-the-counter medicines.  Any problems you or family members have had with anesthetic medicines.  Any blood disorders you have.  Any surgeries you have had.  Any medical conditions you have. What are the risks? Generally, this is a safe procedure. However, problems may occur, including:  Infection.  Bleeding.  Allergic reactions to medicines or dyes.  Damage to  other structures or organs.  Leaking from where the colon was sewn together.  Future blockage of the small intestines from scar tissue. Another surgery may be needed to repair this.  Needing to convert to an open procedure. Complications such as damage to other organs or excessive bleeding may require the surgeon to convert from a laparoscopic procedure to an open procedure. This involves  making a larger incision in the abdomen.  What happens before the procedure? Staying hydrated Follow instructions from your health care provider about hydration, which may include:  Up to 2 hours before the procedure - you may continue to drink clear liquids, such as water, clear fruit juice, black coffee, and plain tea.  Eating and drinking restrictions Follow instructions from your health care provider about eating and drinking, which may include:  8 hours before the procedure - stop eating heavy meals, meals with high fiber, or foods such as meat, fried foods, or fatty foods.  6 hours before the procedure - stop eating light meals or foods, such as toast or cereal.  6 hours before the procedure - stop drinking milk or drinks that contain milk.  2 hours before the procedure - stop drinking clear liquids.  Medicines  Ask your health care provider about: ? Changing or stopping your regular medicines. This is especially important if you are taking diabetes medicines or blood thinners. ? Taking medicines such as aspirin and ibuprofen. These medicines can thin your blood. Do not take these medicines before your procedure if your health care provider instructs you not to.  You may be given antibiotic medicine to clean out bacteria from your colon. Follow the directions carefully and take the medicine at the correct time. General instructions  You may be prescribed an oral bowel prep to clean out your colon in preparation for the surgery: ? Follow instructions from your health care provider about how to do this. ? Do not eat or drink anything else after you have started the bowel prep, unless your health care provider tells you it is safe to do so.  Do not use any products that contain nicotine or tobacco, such as cigarettes and e-cigarettes. If you need help quitting, ask your health care provider. What happens during the procedure?  To reduce your risk of infection: ? Your health  care team will wash or sanitize their hands. ? Your skin will be washed with soap.  An IV tube will be inserted into one of your veins to deliver fluid and medication.  You will be given one of the following: ? A medicine to help you relax (sedative). ? A medicine to make you fall asleep (general anesthetic).  Small monitors will be connected to your body. They will be used to check your heart, blood pressure, and oxygen level.  A breathing tube may be placed into your lungs during the procedure.  A thin, flexible tube (catheter) will be placed into your bladder to drain urine.  A tube may be placed through your nose and into your stomach to drain stomach fluids (nasogastric tube, or NG tube).  Your abdomen will be filled with air so it expands. This gives the surgeon more room to operate and makes your organs easier to see.  Several small cuts (incisions) will be made in your abdomen.  A thin, lighted tube with a tiny camera on the end (laparoscope) will be put through one of the small incisions. The camera on the laparoscope will send a picture to a  computer screen in the operating room. This will give the surgeon a good view inside your abdomen.  Hollow tubes will be put through the other small incisions in your abdomen. The tools that are needed for the procedure will be put through these tubes.  Clamps or staples will be put on both ends of the diseased part of the colon.  The part of the intestine between the clamps or staples will be removed.  If possible, the ends of the healthy colon that remain will be stitched (sutured) or stapled together to allow your body to pass waste (stool).  Sometimes, the remaining colon cannot be stitched back together. If this is the case, a colostomy will be needed. If you need a colostomy: ? An opening to the outside of your body (stoma) will be made through your abdomen. ? The end of your colon will be brought to the opening. It will be  stitched to the skin. ? A bag will be attached to the opening. Stool will drain into this removable bag. ? The colostomy may be temporary or permanent.  The incisions from the colectomy will be closed with sutures or staples. The procedure may vary among health care providers and hospitals. What happens after the procedure?  Your blood pressure, heart rate, breathing rate, and blood oxygen level will be monitored until the medicines you were given have worn off.  You will receive fluids through an IV tube until your bowels start to work properly.  Once your bowels are working again, you will be given clear liquids first and then solid food as tolerated.  You will be given medicines to control your pain and nausea, if needed.  Do not drive for 24 hours if you were given a sedative. This information is not intended to replace advice given to you by your health care provider. Make sure you discuss any questions you have with your health care provider. Document Released: 09/11/2002 Document Revised: 03/22/2016 Document Reviewed: 03/22/2016 Elsevier Interactive Patient Education  [employer].

## 2017-10-13 DIAGNOSIS — I10 Essential (primary) hypertension: Secondary | ICD-10-CM | POA: Diagnosis not present

## 2017-10-13 DIAGNOSIS — Z6838 Body mass index (BMI) 38.0-38.9, adult: Secondary | ICD-10-CM | POA: Diagnosis not present

## 2017-10-13 DIAGNOSIS — I5030 Unspecified diastolic (congestive) heart failure: Secondary | ICD-10-CM | POA: Diagnosis not present

## 2017-10-13 DIAGNOSIS — Z299 Encounter for prophylactic measures, unspecified: Secondary | ICD-10-CM | POA: Diagnosis not present

## 2017-10-13 DIAGNOSIS — J449 Chronic obstructive pulmonary disease, unspecified: Secondary | ICD-10-CM | POA: Diagnosis not present

## 2017-10-13 DIAGNOSIS — E1165 Type 2 diabetes mellitus with hyperglycemia: Secondary | ICD-10-CM | POA: Diagnosis not present

## 2017-10-14 ENCOUNTER — Encounter (HOSPITAL_COMMUNITY)
Admission: RE | Admit: 2017-10-14 | Discharge: 2017-10-14 | Disposition: A | Discharge status: Home / Self Care | Payer: Medicare HMO | Source: Ambulatory Visit | Attending: General Surgery | Admitting: General Surgery

## 2017-10-14 DIAGNOSIS — Z79899 Other long term (current) drug therapy: Secondary | ICD-10-CM | POA: Diagnosis not present

## 2017-10-14 DIAGNOSIS — Z800 Family history of malignant neoplasm of digestive organs: Secondary | ICD-10-CM | POA: Diagnosis not present

## 2017-10-14 DIAGNOSIS — Z7901 Long term (current) use of anticoagulants: Secondary | ICD-10-CM | POA: Diagnosis not present

## 2017-10-14 DIAGNOSIS — I130 Hypertensive heart and chronic kidney disease with heart failure and stage 1 through stage 4 chronic kidney disease, or unspecified chronic kidney disease: Secondary | ICD-10-CM | POA: Diagnosis not present

## 2017-10-14 DIAGNOSIS — E785 Hyperlipidemia, unspecified: Secondary | ICD-10-CM | POA: Insufficient documentation

## 2017-10-14 DIAGNOSIS — J449 Chronic obstructive pulmonary disease, unspecified: Secondary | ICD-10-CM | POA: Diagnosis not present

## 2017-10-14 DIAGNOSIS — I495 Sick sinus syndrome: Secondary | ICD-10-CM | POA: Insufficient documentation

## 2017-10-14 DIAGNOSIS — Z950 Presence of cardiac pacemaker: Secondary | ICD-10-CM | POA: Insufficient documentation

## 2017-10-14 DIAGNOSIS — I480 Paroxysmal atrial fibrillation: Secondary | ICD-10-CM | POA: Diagnosis not present

## 2017-10-14 DIAGNOSIS — F419 Anxiety disorder, unspecified: Secondary | ICD-10-CM | POA: Diagnosis not present

## 2017-10-14 DIAGNOSIS — Z885 Allergy status to narcotic agent status: Secondary | ICD-10-CM | POA: Insufficient documentation

## 2017-10-14 DIAGNOSIS — K219 Gastro-esophageal reflux disease without esophagitis: Secondary | ICD-10-CM | POA: Diagnosis not present

## 2017-10-14 DIAGNOSIS — E1122 Type 2 diabetes mellitus with diabetic chronic kidney disease: Secondary | ICD-10-CM | POA: Diagnosis not present

## 2017-10-14 DIAGNOSIS — Z9981 Dependence on supplemental oxygen: Secondary | ICD-10-CM | POA: Insufficient documentation

## 2017-10-14 DIAGNOSIS — I5032 Chronic diastolic (congestive) heart failure: Secondary | ICD-10-CM | POA: Insufficient documentation

## 2017-10-14 DIAGNOSIS — Z8249 Family history of ischemic heart disease and other diseases of the circulatory system: Secondary | ICD-10-CM | POA: Insufficient documentation

## 2017-10-14 DIAGNOSIS — Z880 Allergy status to penicillin: Secondary | ICD-10-CM | POA: Diagnosis not present

## 2017-10-14 DIAGNOSIS — N182 Chronic kidney disease, stage 2 (mild): Secondary | ICD-10-CM | POA: Insufficient documentation

## 2017-10-14 DIAGNOSIS — Z833 Family history of diabetes mellitus: Secondary | ICD-10-CM | POA: Diagnosis not present

## 2017-10-14 DIAGNOSIS — D122 Benign neoplasm of ascending colon: Secondary | ICD-10-CM | POA: Insufficient documentation

## 2017-10-14 DIAGNOSIS — Z794 Long term (current) use of insulin: Secondary | ICD-10-CM | POA: Diagnosis not present

## 2017-10-14 DIAGNOSIS — Z01812 Encounter for preprocedural laboratory examination: Secondary | ICD-10-CM | POA: Diagnosis present

## 2017-10-14 DIAGNOSIS — E1151 Type 2 diabetes mellitus with diabetic peripheral angiopathy without gangrene: Secondary | ICD-10-CM | POA: Insufficient documentation

## 2017-10-14 DIAGNOSIS — Z0183 Encounter for blood typing: Secondary | ICD-10-CM | POA: Diagnosis not present

## 2017-10-14 LAB — COMPREHENSIVE METABOLIC PANEL
ALK PHOS: 67 U/L (ref 38–126)
ALT: 12 U/L — AB (ref 14–54)
AST: 18 U/L (ref 15–41)
Albumin: 3.2 g/dL — ABNORMAL LOW (ref 3.5–5.0)
Anion gap: 12 (ref 5–15)
BUN: 16 mg/dL (ref 6–20)
CALCIUM: 8.8 mg/dL — AB (ref 8.9–10.3)
CO2: 28 mmol/L (ref 22–32)
CREATININE: 1.11 mg/dL — AB (ref 0.44–1.00)
Chloride: 100 mmol/L — ABNORMAL LOW (ref 101–111)
GFR calc non Af Amer: 47 mL/min — ABNORMAL LOW (ref >60)
GFR, EST AFRICAN AMERICAN: 54 mL/min — AB (ref >60)
Glucose, Bld: 237 mg/dL — ABNORMAL HIGH (ref 65–99)
Potassium: 3.8 mmol/L (ref 3.5–5.1)
SODIUM: 140 mmol/L (ref 135–145)
Total Bilirubin: 0.3 mg/dL (ref 0.3–1.2)
Total Protein: 7.2 g/dL (ref 6.5–8.1)

## 2017-10-14 LAB — CBC WITH DIFFERENTIAL/PLATELET
Basophils Absolute: 0 10*3/uL (ref 0.0–0.1)
Basophils Relative: 0 %
Eosinophils Absolute: 0.3 10*3/uL (ref 0.0–0.7)
Eosinophils Relative: 5 %
HEMATOCRIT: 33.5 % — AB (ref 36.0–46.0)
HEMOGLOBIN: 9.9 g/dL — AB (ref 12.0–15.0)
LYMPHS ABS: 1.5 10*3/uL (ref 0.7–4.0)
Lymphocytes Relative: 29 %
MCH: 24.9 pg — ABNORMAL LOW (ref 26.0–34.0)
MCHC: 29.6 g/dL — AB (ref 30.0–36.0)
MCV: 84.2 fL (ref 78.0–100.0)
MONOS PCT: 10 %
Monocytes Absolute: 0.5 10*3/uL (ref 0.1–1.0)
NEUTROS PCT: 56 %
Neutro Abs: 2.9 10*3/uL (ref 1.7–7.7)
Platelets: 382 10*3/uL (ref 150–400)
RBC: 3.98 MIL/uL (ref 3.87–5.11)
RDW: 15.9 % — ABNORMAL HIGH (ref 11.5–15.5)
WBC: 5.3 10*3/uL (ref 4.0–10.5)

## 2017-10-14 LAB — GLUCOSE, CAPILLARY: Glucose-Capillary: 240 mg/dL — ABNORMAL HIGH (ref 65–99)

## 2017-10-15 LAB — CEA: CEA: 2.1 ng/mL (ref 0.0–4.7)

## 2017-10-17 ENCOUNTER — Encounter: Payer: Medicare HMO | Admitting: *Deleted

## 2017-10-17 ENCOUNTER — Other Ambulatory Visit: Payer: Self-pay | Admitting: Internal Medicine

## 2017-10-17 ENCOUNTER — Telehealth: Payer: Self-pay | Admitting: Cardiology

## 2017-10-17 LAB — ABO/RH: ABO/RH(D): O POS

## 2017-10-17 LAB — PREPARE RBC (CROSSMATCH)

## 2017-10-17 NOTE — Pre-Procedure Instructions (Signed)
H&H shown to Dr Arnoldo Morale. Patient to be cross matched for 2 units. Order placed and spoke with Judeen Hammans in lab to have this done.

## 2017-10-17 NOTE — Telephone Encounter (Signed)
LMOVM reminding pt to send remote transmission.   

## 2017-10-19 ENCOUNTER — Encounter (HOSPITAL_COMMUNITY)
Admission: RE | Disposition: A | Discharge status: Home / Self Care | Payer: Self-pay | Source: Ambulatory Visit | Attending: General Surgery

## 2017-10-19 ENCOUNTER — Encounter (HOSPITAL_COMMUNITY): Payer: Self-pay | Admitting: *Deleted

## 2017-10-19 ENCOUNTER — Inpatient Hospital Stay (HOSPITAL_COMMUNITY): Payer: Medicare HMO | Admitting: Anesthesiology

## 2017-10-19 ENCOUNTER — Inpatient Hospital Stay (HOSPITAL_COMMUNITY)
Admission: RE | Admit: 2017-10-19 | Discharge: 2017-10-22 | DRG: 330 | Disposition: A | Discharge status: Home / Self Care | Payer: Medicare HMO | Source: Ambulatory Visit | Attending: General Surgery | Admitting: General Surgery

## 2017-10-19 ENCOUNTER — Other Ambulatory Visit: Payer: Self-pay

## 2017-10-19 DIAGNOSIS — E1122 Type 2 diabetes mellitus with diabetic chronic kidney disease: Secondary | ICD-10-CM | POA: Diagnosis not present

## 2017-10-19 DIAGNOSIS — Z950 Presence of cardiac pacemaker: Secondary | ICD-10-CM

## 2017-10-19 DIAGNOSIS — Z8249 Family history of ischemic heart disease and other diseases of the circulatory system: Secondary | ICD-10-CM

## 2017-10-19 DIAGNOSIS — K635 Polyp of colon: Secondary | ICD-10-CM | POA: Diagnosis not present

## 2017-10-19 DIAGNOSIS — Z9981 Dependence on supplemental oxygen: Secondary | ICD-10-CM | POA: Diagnosis not present

## 2017-10-19 DIAGNOSIS — Z794 Long term (current) use of insulin: Secondary | ICD-10-CM

## 2017-10-19 DIAGNOSIS — D122 Benign neoplasm of ascending colon: Principal | ICD-10-CM | POA: Diagnosis present

## 2017-10-19 DIAGNOSIS — M1990 Unspecified osteoarthritis, unspecified site: Secondary | ICD-10-CM | POA: Diagnosis present

## 2017-10-19 DIAGNOSIS — Z9842 Cataract extraction status, left eye: Secondary | ICD-10-CM

## 2017-10-19 DIAGNOSIS — Z79899 Other long term (current) drug therapy: Secondary | ICD-10-CM | POA: Diagnosis not present

## 2017-10-19 DIAGNOSIS — I495 Sick sinus syndrome: Secondary | ICD-10-CM | POA: Diagnosis present

## 2017-10-19 DIAGNOSIS — Z961 Presence of intraocular lens: Secondary | ICD-10-CM | POA: Diagnosis present

## 2017-10-19 DIAGNOSIS — E669 Obesity, unspecified: Secondary | ICD-10-CM | POA: Diagnosis present

## 2017-10-19 DIAGNOSIS — I2510 Atherosclerotic heart disease of native coronary artery without angina pectoris: Secondary | ICD-10-CM | POA: Diagnosis not present

## 2017-10-19 DIAGNOSIS — I480 Paroxysmal atrial fibrillation: Secondary | ICD-10-CM | POA: Diagnosis present

## 2017-10-19 DIAGNOSIS — I130 Hypertensive heart and chronic kidney disease with heart failure and stage 1 through stage 4 chronic kidney disease, or unspecified chronic kidney disease: Secondary | ICD-10-CM | POA: Diagnosis not present

## 2017-10-19 DIAGNOSIS — I5032 Chronic diastolic (congestive) heart failure: Secondary | ICD-10-CM | POA: Diagnosis not present

## 2017-10-19 DIAGNOSIS — K219 Gastro-esophageal reflux disease without esophagitis: Secondary | ICD-10-CM | POA: Diagnosis present

## 2017-10-19 DIAGNOSIS — Z800 Family history of malignant neoplasm of digestive organs: Secondary | ICD-10-CM

## 2017-10-19 DIAGNOSIS — K6389 Other specified diseases of intestine: Secondary | ICD-10-CM | POA: Diagnosis not present

## 2017-10-19 DIAGNOSIS — I10 Essential (primary) hypertension: Secondary | ICD-10-CM | POA: Diagnosis not present

## 2017-10-19 DIAGNOSIS — E785 Hyperlipidemia, unspecified: Secondary | ICD-10-CM | POA: Diagnosis present

## 2017-10-19 DIAGNOSIS — Z83438 Family history of other disorder of lipoprotein metabolism and other lipidemia: Secondary | ICD-10-CM

## 2017-10-19 DIAGNOSIS — Z885 Allergy status to narcotic agent status: Secondary | ICD-10-CM

## 2017-10-19 DIAGNOSIS — N182 Chronic kidney disease, stage 2 (mild): Secondary | ICD-10-CM | POA: Diagnosis not present

## 2017-10-19 DIAGNOSIS — J449 Chronic obstructive pulmonary disease, unspecified: Secondary | ICD-10-CM | POA: Diagnosis not present

## 2017-10-19 DIAGNOSIS — Z7901 Long term (current) use of anticoagulants: Secondary | ICD-10-CM

## 2017-10-19 DIAGNOSIS — F419 Anxiety disorder, unspecified: Secondary | ICD-10-CM | POA: Diagnosis present

## 2017-10-19 DIAGNOSIS — Z9841 Cataract extraction status, right eye: Secondary | ICD-10-CM

## 2017-10-19 DIAGNOSIS — Z880 Allergy status to penicillin: Secondary | ICD-10-CM

## 2017-10-19 DIAGNOSIS — Z833 Family history of diabetes mellitus: Secondary | ICD-10-CM

## 2017-10-19 DIAGNOSIS — E1151 Type 2 diabetes mellitus with diabetic peripheral angiopathy without gangrene: Secondary | ICD-10-CM | POA: Diagnosis present

## 2017-10-19 DIAGNOSIS — E109 Type 1 diabetes mellitus without complications: Secondary | ICD-10-CM

## 2017-10-19 DIAGNOSIS — E119 Type 2 diabetes mellitus without complications: Secondary | ICD-10-CM | POA: Diagnosis not present

## 2017-10-19 DIAGNOSIS — Z9049 Acquired absence of other specified parts of digestive tract: Secondary | ICD-10-CM

## 2017-10-19 HISTORY — PX: PARTIAL COLECTOMY: SHX5273

## 2017-10-19 LAB — GLUCOSE, CAPILLARY
GLUCOSE-CAPILLARY: 107 mg/dL — AB (ref 65–99)
GLUCOSE-CAPILLARY: 172 mg/dL — AB (ref 65–99)
GLUCOSE-CAPILLARY: 268 mg/dL — AB (ref 65–99)
GLUCOSE-CAPILLARY: 191 mg/dL — AB (ref 65–99)
Glucose-Capillary: 173 mg/dL — ABNORMAL HIGH (ref 65–99)

## 2017-10-19 LAB — HEMOGLOBIN A1C
HEMOGLOBIN A1C: 7.7 % — AB (ref 4.8–5.6)
MEAN PLASMA GLUCOSE: 174.29 mg/dL

## 2017-10-19 SURGERY — COLECTOMY, PARTIAL
Anesthesia: General | Site: Abdomen

## 2017-10-19 MED ORDER — ADULT MULTIVITAMIN W/MINERALS CH
1.0000 | ORAL_TABLET | Freq: Every day | ORAL | Status: DC
  Administered 2017-10-19 – 2017-10-22 (×4): 1 via ORAL
  Filled 2017-10-19 (×4): qty 1

## 2017-10-19 MED ORDER — BENAZEPRIL HCL 10 MG PO TABS
40.0000 mg | ORAL_TABLET | Freq: Every day | ORAL | Status: DC
  Administered 2017-10-19 – 2017-10-22 (×4): 40 mg via ORAL
  Filled 2017-10-19 (×4): qty 4

## 2017-10-19 MED ORDER — CIPROFLOXACIN IN D5W 400 MG/200ML IV SOLN
400.0000 mg | INTRAVENOUS | Status: AC
  Administered 2017-10-19: 400 mg via INTRAVENOUS
  Filled 2017-10-19: qty 200

## 2017-10-19 MED ORDER — 0.9 % SODIUM CHLORIDE (POUR BTL) OPTIME
TOPICAL | Status: DC | PRN
  Administered 2017-10-19: 2000 mL
  Administered 2017-10-19: 1000 mL

## 2017-10-19 MED ORDER — ONDANSETRON 4 MG PO TBDP
4.0000 mg | ORAL_TABLET | Freq: Four times a day (QID) | ORAL | Status: DC | PRN
Start: 2017-10-19 — End: 2017-10-22

## 2017-10-19 MED ORDER — KETOROLAC TROMETHAMINE 30 MG/ML IJ SOLN: 15.0000 mg | Freq: Four times a day (QID) | INTRAMUSCULAR | Status: DC | PRN

## 2017-10-19 MED ORDER — ALVIMOPAN 12 MG PO CAPS
12.0000 mg | ORAL_CAPSULE | Freq: Two times a day (BID) | ORAL | Status: DC
Start: 2017-10-20 — End: 2017-10-21
  Administered 2017-10-20 (×2): 12 mg via ORAL
  Filled 2017-10-19 (×2): qty 1

## 2017-10-19 MED ORDER — PHENYLEPHRINE HCL 10 MG/ML IJ SOLN
INTRAMUSCULAR | Status: DC | PRN
  Administered 2017-10-19: .04 mg via INTRAVENOUS
  Administered 2017-10-19: 0.1 mg via INTRAVENOUS
  Administered 2017-10-19 (×4): .04 mg via INTRAVENOUS
  Administered 2017-10-19: .06 mg via INTRAVENOUS
  Administered 2017-10-19 (×3): .04 mg via INTRAVENOUS
  Administered 2017-10-19: .06 mg via INTRAVENOUS
  Administered 2017-10-19: .04 mg via INTRAVENOUS
  Administered 2017-10-19: 0.1 mg via INTRAVENOUS
  Administered 2017-10-19: .04 mg via INTRAVENOUS
  Administered 2017-10-19: .06 mg via INTRAVENOUS
  Administered 2017-10-19 (×2): .04 mg via INTRAVENOUS
  Administered 2017-10-19: .06 mg via INTRAVENOUS
  Administered 2017-10-19: .04 mg via INTRAVENOUS

## 2017-10-19 MED ORDER — ACETAMINOPHEN 650 MG RE SUPP: 650.0000 mg | Freq: Four times a day (QID) | RECTAL | Status: DC | PRN

## 2017-10-19 MED ORDER — ACETAMINOPHEN 325 MG PO TABS: 650.0000 mg | ORAL_TABLET | Freq: Four times a day (QID) | ORAL | Status: DC | PRN

## 2017-10-19 MED ORDER — SUGAMMADEX SODIUM 200 MG/2ML IV SOLN
INTRAVENOUS | Status: DC | PRN
  Administered 2017-10-19: 250 mg via INTRAVENOUS

## 2017-10-19 MED ORDER — POVIDONE-IODINE 10 % OINT PACKET
TOPICAL_OINTMENT | CUTANEOUS | Status: DC | PRN
  Administered 2017-10-19: 2 via TOPICAL

## 2017-10-19 MED ORDER — BUPIVACAINE LIPOSOME 1.3 % IJ SUSP
INTRAMUSCULAR | Status: DC | PRN
  Administered 2017-10-19: 20 mL

## 2017-10-19 MED ORDER — FLECAINIDE ACETATE 50 MG PO TABS
50.0000 mg | ORAL_TABLET | Freq: Every day | ORAL | Status: DC
  Administered 2017-10-20 – 2017-10-22 (×3): 50 mg via ORAL
  Filled 2017-10-19 (×3): qty 1

## 2017-10-19 MED ORDER — ENOXAPARIN SODIUM 40 MG/0.4ML ~~LOC~~ SOLN
40.0000 mg | Freq: Once | SUBCUTANEOUS | Status: AC
  Administered 2017-10-19: 40 mg via SUBCUTANEOUS
  Filled 2017-10-19: qty 0.4

## 2017-10-19 MED ORDER — MORPHINE SULFATE (PF) 2 MG/ML IV SOLN: 1.0000 mg | INTRAVENOUS | Status: DC | PRN

## 2017-10-19 MED ORDER — DIPHENHYDRAMINE HCL 12.5 MG/5ML PO ELIX: 12.5000 mg | ORAL_SOLUTION | Freq: Four times a day (QID) | ORAL | Status: DC | PRN

## 2017-10-19 MED ORDER — ALVIMOPAN 12 MG PO CAPS
12.0000 mg | ORAL_CAPSULE | ORAL | Status: AC
  Administered 2017-10-19: 12 mg via ORAL
  Filled 2017-10-19: qty 1

## 2017-10-19 MED ORDER — FENTANYL CITRATE (PF) 250 MCG/5ML IJ SOLN
INTRAMUSCULAR | Status: AC
  Filled 2017-10-19: qty 5

## 2017-10-19 MED ORDER — LACTATED RINGERS IV SOLN
INTRAVENOUS | Status: DC
  Administered 2017-10-19: 1000 mL via INTRAVENOUS
  Administered 2017-10-19: 09:00:00 via INTRAVENOUS

## 2017-10-19 MED ORDER — TRAMADOL HCL 50 MG PO TABS: 50.0000 mg | ORAL_TABLET | Freq: Four times a day (QID) | ORAL | Status: DC | PRN

## 2017-10-19 MED ORDER — BUPIVACAINE LIPOSOME 1.3 % IJ SUSP
INTRAMUSCULAR | Status: AC
  Filled 2017-10-19: qty 20

## 2017-10-19 MED ORDER — POVIDONE-IODINE 10 % EX OINT
TOPICAL_OINTMENT | CUTANEOUS | Status: AC
  Filled 2017-10-19: qty 3

## 2017-10-19 MED ORDER — LIDOCAINE HCL (CARDIAC) 20 MG/ML IV SOLN
INTRAVENOUS | Status: DC | PRN
  Administered 2017-10-19: 80 mg via INTRAVENOUS

## 2017-10-19 MED ORDER — ALBUTEROL SULFATE (2.5 MG/3ML) 0.083% IN NEBU: 2.5000 mg | INHALATION_SOLUTION | Freq: Four times a day (QID) | RESPIRATORY_TRACT | Status: DC | PRN

## 2017-10-19 MED ORDER — ATORVASTATIN CALCIUM 40 MG PO TABS
40.0000 mg | ORAL_TABLET | Freq: Every day | ORAL | Status: DC
  Administered 2017-10-19 – 2017-10-22 (×4): 40 mg via ORAL
  Filled 2017-10-19 (×3): qty 1

## 2017-10-19 MED ORDER — PROMETHAZINE HCL 25 MG/ML IJ SOLN: 6.2500 mg | INTRAMUSCULAR | Status: DC | PRN

## 2017-10-19 MED ORDER — PHENYLEPHRINE 40 MCG/ML (10ML) SYRINGE FOR IV PUSH (FOR BLOOD PRESSURE SUPPORT)
PREFILLED_SYRINGE | INTRAVENOUS | Status: AC
  Filled 2017-10-19: qty 10

## 2017-10-19 MED ORDER — CHLORHEXIDINE GLUCONATE CLOTH 2 % EX PADS: 6.0000 | MEDICATED_PAD | Freq: Once | CUTANEOUS | Status: DC

## 2017-10-19 MED ORDER — DIPHENHYDRAMINE HCL 50 MG/ML IJ SOLN: 12.5000 mg | Freq: Four times a day (QID) | INTRAMUSCULAR | Status: DC | PRN

## 2017-10-19 MED ORDER — MIDAZOLAM HCL 2 MG/2ML IJ SOLN: 0.5000 mg | Freq: Once | INTRAMUSCULAR | Status: DC | PRN

## 2017-10-19 MED ORDER — ROCURONIUM BROMIDE 100 MG/10ML IV SOLN
INTRAVENOUS | Status: DC | PRN
  Administered 2017-10-19: 40 mg via INTRAVENOUS
  Administered 2017-10-19: 10 mg via INTRAVENOUS

## 2017-10-19 MED ORDER — FUROSEMIDE 40 MG PO TABS
40.0000 mg | ORAL_TABLET | Freq: Every day | ORAL | Status: DC
  Administered 2017-10-19 – 2017-10-22 (×4): 40 mg via ORAL
  Filled 2017-10-19 (×3): qty 1

## 2017-10-19 MED ORDER — OLOPATADINE HCL 0.1 % OP SOLN
1.0000 [drp] | Freq: Two times a day (BID) | OPHTHALMIC | Status: DC
  Administered 2017-10-19 – 2017-10-22 (×7): 1 [drp] via OPHTHALMIC
  Filled 2017-10-19 (×2): qty 5

## 2017-10-19 MED ORDER — ENOXAPARIN SODIUM 40 MG/0.4ML ~~LOC~~ SOLN
40.0000 mg | SUBCUTANEOUS | Status: DC
  Administered 2017-10-20: 40 mg via SUBCUTANEOUS
  Filled 2017-10-19: qty 0.4

## 2017-10-19 MED ORDER — KETOROLAC TROMETHAMINE 30 MG/ML IJ SOLN
15.0000 mg | Freq: Four times a day (QID) | INTRAMUSCULAR | Status: DC
  Administered 2017-10-19 – 2017-10-22 (×10): 15 mg via INTRAVENOUS
  Filled 2017-10-19 (×10): qty 1

## 2017-10-19 MED ORDER — MECLIZINE HCL 12.5 MG PO TABS
12.5000 mg | ORAL_TABLET | Freq: Two times a day (BID) | ORAL | Status: DC | PRN
  Administered 2017-10-19: 12.5 mg via ORAL
  Filled 2017-10-19: qty 1

## 2017-10-19 MED ORDER — FENTANYL CITRATE (PF) 100 MCG/2ML IJ SOLN
INTRAMUSCULAR | Status: DC | PRN
  Administered 2017-10-19: 50 ug via INTRAVENOUS
  Administered 2017-10-19: 25 ug via INTRAVENOUS

## 2017-10-19 MED ORDER — VENLAFAXINE HCL ER 75 MG PO CP24
150.0000 mg | ORAL_CAPSULE | Freq: Every day | ORAL | Status: DC
  Administered 2017-10-19 – 2017-10-22 (×4): 150 mg via ORAL
  Filled 2017-10-19 (×4): qty 2

## 2017-10-19 MED ORDER — LORAZEPAM 2 MG/ML IJ SOLN: 0.5000 mg | INTRAMUSCULAR | Status: DC | PRN

## 2017-10-19 MED ORDER — ONDANSETRON HCL 4 MG/2ML IJ SOLN
INTRAMUSCULAR | Status: DC | PRN
  Administered 2017-10-19: 4 mg via INTRAVENOUS

## 2017-10-19 MED ORDER — ONDANSETRON HCL 4 MG/2ML IJ SOLN: 4.0000 mg | Freq: Four times a day (QID) | INTRAMUSCULAR | Status: DC | PRN

## 2017-10-19 MED ORDER — EPHEDRINE SULFATE 50 MG/ML IJ SOLN
INTRAMUSCULAR | Status: DC | PRN
  Administered 2017-10-19 (×2): 10 mg via INTRAVENOUS

## 2017-10-19 MED ORDER — HYDROMORPHONE HCL 1 MG/ML IJ SOLN
0.2500 mg | INTRAMUSCULAR | Status: DC | PRN
  Administered 2017-10-19: 0.5 mg via INTRAVENOUS

## 2017-10-19 MED ORDER — GLYCOPYRROLATE 0.2 MG/ML IJ SOLN
INTRAMUSCULAR | Status: DC | PRN
  Administered 2017-10-19: 0.2 mg via INTRAVENOUS

## 2017-10-19 MED ORDER — LACTATED RINGERS IV SOLN
INTRAVENOUS | Status: DC
  Administered 2017-10-19 – 2017-10-20 (×4): via INTRAVENOUS

## 2017-10-19 MED ORDER — KETOROLAC TROMETHAMINE 30 MG/ML IJ SOLN: 30.0000 mg | Freq: Four times a day (QID) | INTRAMUSCULAR | Status: DC | PRN

## 2017-10-19 MED ORDER — PROPOFOL 10 MG/ML IV BOLUS
INTRAVENOUS | Status: DC | PRN
  Administered 2017-10-19: 100 mg via INTRAVENOUS

## 2017-10-19 MED ORDER — ACETAMINOPHEN 10 MG/ML IV SOLN: 1000.0000 mg | Freq: Once | INTRAVENOUS | Status: DC | PRN

## 2017-10-19 MED ORDER — INSULIN ASPART 100 UNIT/ML ~~LOC~~ SOLN
0.0000 [IU] | Freq: Three times a day (TID) | SUBCUTANEOUS | Status: DC
  Administered 2017-10-19: 11 [IU] via SUBCUTANEOUS
  Administered 2017-10-19 – 2017-10-20 (×2): 4 [IU] via SUBCUTANEOUS

## 2017-10-19 MED ORDER — PROPOFOL 10 MG/ML IV BOLUS
INTRAVENOUS | Status: AC
  Filled 2017-10-19: qty 40

## 2017-10-19 MED ORDER — KETOROLAC TROMETHAMINE 30 MG/ML IJ SOLN
30.0000 mg | Freq: Four times a day (QID) | INTRAMUSCULAR | Status: DC
  Administered 2017-10-19: 30 mg via INTRAVENOUS
  Filled 2017-10-19: qty 1

## 2017-10-19 MED ORDER — PANTOPRAZOLE SODIUM 40 MG PO TBEC
40.0000 mg | DELAYED_RELEASE_TABLET | Freq: Every day | ORAL | Status: DC
  Administered 2017-10-19 – 2017-10-22 (×4): 40 mg via ORAL
  Filled 2017-10-19 (×4): qty 1

## 2017-10-19 MED ORDER — SIMETHICONE 80 MG PO CHEW: 40.0000 mg | CHEWABLE_TABLET | Freq: Four times a day (QID) | ORAL | Status: DC | PRN

## 2017-10-19 MED ORDER — HYDROMORPHONE HCL 1 MG/ML IJ SOLN
INTRAMUSCULAR | Status: AC
  Filled 2017-10-19: qty 0.5

## 2017-10-19 MED ORDER — METRONIDAZOLE IN NACL 5-0.79 MG/ML-% IV SOLN
500.0000 mg | INTRAVENOUS | Status: AC
  Administered 2017-10-19: 500 mg via INTRAVENOUS
  Filled 2017-10-19: qty 100

## 2017-10-19 SURGICAL SUPPLY — 57 items
BAG HAMPER (MISCELLANEOUS) ×3 IMPLANT
BLADE HEX COATED 2.75 (ELECTRODE) ×3 IMPLANT
CHLORAPREP W/TINT 26ML (MISCELLANEOUS) ×3 IMPLANT
CLOTH BEACON ORANGE TIMEOUT ST (SAFETY) ×3 IMPLANT
COVER LIGHT HANDLE STERIS (MISCELLANEOUS) ×6 IMPLANT
COVER MAYO STAND XLG (DRAPE) ×3 IMPLANT
DRAPE UTILITY W/TAPE 26X15 (DRAPES) ×6 IMPLANT
DRAPE WARM FLUID 44X44 (DRAPE) ×3 IMPLANT
DRSG OPSITE POSTOP 4X10 (GAUZE/BANDAGES/DRESSINGS) IMPLANT
DRSG OPSITE POSTOP 4X8 (GAUZE/BANDAGES/DRESSINGS) ×6 IMPLANT
ELECT BLADE 6 FLAT ULTRCLN (ELECTRODE) ×3 IMPLANT
ELECT REM PT RETURN 9FT ADLT (ELECTROSURGICAL) ×3
ELECTRODE REM PT RTRN 9FT ADLT (ELECTROSURGICAL) ×1 IMPLANT
GLOVE BIO SURGEON STRL SZ 6.5 (GLOVE) ×4 IMPLANT
GLOVE BIO SURGEONS STRL SZ 6.5 (GLOVE) ×2
GLOVE BIOGEL PI IND STRL 6.5 (GLOVE) ×2 IMPLANT
GLOVE BIOGEL PI IND STRL 7.0 (GLOVE) ×4 IMPLANT
GLOVE BIOGEL PI INDICATOR 6.5 (GLOVE) ×4
GLOVE BIOGEL PI INDICATOR 7.0 (GLOVE) ×8
GLOVE ECLIPSE 6.5 STRL STRAW (GLOVE) ×6 IMPLANT
GLOVE SURG SS PI 7.5 STRL IVOR (GLOVE) ×6 IMPLANT
GOWN STRL REUS W/ TWL XL LVL3 (GOWN DISPOSABLE) ×2 IMPLANT
GOWN STRL REUS W/TWL LRG LVL3 (GOWN DISPOSABLE) ×12 IMPLANT
GOWN STRL REUS W/TWL XL LVL3 (GOWN DISPOSABLE) ×4
HANDLE SUCTION POOLE (INSTRUMENTS) ×1 IMPLANT
INST SET MAJOR GENERAL (KITS) ×3 IMPLANT
KIT BLADEGUARD II DBL (SET/KITS/TRAYS/PACK) ×3 IMPLANT
KIT TURNOVER KIT A (KITS) ×3 IMPLANT
LIGASURE IMPACT 36 18CM CVD LR (INSTRUMENTS) ×3 IMPLANT
MANIFOLD NEPTUNE II (INSTRUMENTS) ×3 IMPLANT
NEEDLE HYPO 18GX1.5 BLUNT FILL (NEEDLE) ×3 IMPLANT
NEEDLE HYPO 21X1.5 SAFETY (NEEDLE) ×3 IMPLANT
NS IRRIG 1000ML POUR BTL (IV SOLUTION) ×9 IMPLANT
PACK ABDOMINAL MAJOR (CUSTOM PROCEDURE TRAY) ×3 IMPLANT
PAD ARMBOARD 7.5X6 YLW CONV (MISCELLANEOUS) ×3 IMPLANT
PENCIL HANDSWITCHING (ELECTRODE) ×3 IMPLANT
POUCH OSTOMY 2 PC DRNBL 2.25 (WOUND CARE) IMPLANT
POUCH OSTOMY DRNBL 2 1/4 (WOUND CARE)
RELOAD PROXIMATE 75MM BLUE (ENDOMECHANICALS) ×6 IMPLANT
RETRACTOR WND ALEXIS 25 LRG (MISCELLANEOUS) ×1 IMPLANT
RTRCTR WOUND ALEXIS 25CM LRG (MISCELLANEOUS) ×3
SET BASIN LINEN APH (SET/KITS/TRAYS/PACK) ×3 IMPLANT
SPONGE LAP 18X18 X RAY DECT (DISPOSABLE) ×6 IMPLANT
STAPLER GUN LINEAR PROX 60 (STAPLE) ×3 IMPLANT
STAPLER PROXIMATE 75MM BLUE (STAPLE) ×3 IMPLANT
STAPLER VISISTAT (STAPLE) ×3 IMPLANT
SUCTION POOLE HANDLE (INSTRUMENTS) ×3
SUCTION YANKAUER HANDLE (MISCELLANEOUS) ×3 IMPLANT
SUT CHROMIC 2 0 SH (SUTURE) ×3 IMPLANT
SUT NOVA NAB GS-26 0 60 (SUTURE) ×6 IMPLANT
SUT SILK 2 0 (SUTURE)
SUT SILK 2-0 18XBRD TIE 12 (SUTURE) IMPLANT
SUT SILK 3 0 SH CR/8 (SUTURE) ×3 IMPLANT
SYR 20CC LL (SYRINGE) ×3 IMPLANT
TOWEL OR 17X26 4PK STRL BLUE (TOWEL DISPOSABLE) ×3 IMPLANT
TRAY FOLEY W/METER SILVER 16FR (SET/KITS/TRAYS/PACK) ×3 IMPLANT
YANKAUER SUCT BULB TIP 10FT TU (MISCELLANEOUS) ×3 IMPLANT

## 2017-10-19 NOTE — Anesthesia Postprocedure Evaluation (Signed)
Anesthesia Post Note  Patient: Miranda Sexton  Procedure(s) Performed: PARTIAL COLECTOMY (N/A Abdomen)  Patient location during evaluation: PACU Anesthesia Type: General Level of consciousness: awake and alert and patient cooperative Pain management: satisfactory to patient Vital Signs Assessment: post-procedure vital signs reviewed and stable Respiratory status: spontaneous breathing and patient connected to nasal cannula oxygen Postop Assessment: no apparent nausea or vomiting Anesthetic complications: no     Last Vitals:  Vitals:   10/19/17 1009 10/19/17 1018  BP: 103/65 (!) 128/51  Pulse: 65 (!) 59  Resp:  14  Temp: (!) 36.2 C 36.4 C  SpO2: 96% 98%    Last Pain:  Vitals:   10/19/17 1009  TempSrc: Axillary  PainSc: 1                  Swayze Pries

## 2017-10-19 NOTE — Plan of Care (Signed)
progressing 

## 2017-10-19 NOTE — Progress Notes (Signed)
2 units of PRBC verified by Judeen Hammans in the lab.

## 2017-10-19 NOTE — Interval H&P Note (Signed)
History and Physical Interval Note:  10/19/2017 7:17 AM  Miranda Sexton  has presented today for surgery, with the diagnosis of dysplasia of colon  The various methods of treatment have been discussed with the patient and family. After consideration of risks, benefits and other options for treatment, the patient has consented to  Procedure(s): PARTIAL COLECTOMY (N/A) as a surgical intervention .  The patient's history has been reviewed, patient examined, no change in status, stable for surgery.  I have reviewed the patient's chart and labs.  Questions were answered to the patient's satisfaction.     [name]

## 2017-10-19 NOTE — Transfer of Care (Signed)
Immediate Anesthesia Transfer of Care Note  Patient: Miranda Sexton  Procedure(s) Performed: PARTIAL COLECTOMY (N/A Abdomen)  Patient Location: PACU  Anesthesia Type:General  Level of Consciousness: awake, alert , oriented and patient cooperative  Airway & Oxygen Therapy: Patient Spontanous Breathing and Patient connected to face mask oxygen  Post-op Assessment: Report given to RN and Post -op Vital signs reviewed and stable  Post vital signs: Reviewed and stable  Last Vitals:  Vitals Value Taken Time  BP 128/53 10/19/2017  9:06 AM  Temp    Pulse 66 10/19/2017  9:08 AM  Resp 0 10/19/2017  9:08 AM  SpO2 98 % 10/19/2017  9:08 AM  Vitals shown include unvalidated device data.  Last Pain:  Vitals:   10/19/17 0642  TempSrc: Oral  PainSc: 2       Patients Stated Pain Goal: 6 (85/88/50 2774)  Complications: No apparent anesthesia complications

## 2017-10-19 NOTE — Anesthesia Procedure Notes (Signed)
Procedure Name: Intubation Date/Time: 10/19/2017 7:38 AM Performed by: Adalberto Ill, CRNA Pre-anesthesia Checklist: Patient identified, Emergency Drugs available, Suction available and Patient being monitored Patient Re-evaluated:Patient Re-evaluated prior to induction Oxygen Delivery Method: Circle system utilized Preoxygenation: Pre-oxygenation with 100% oxygen Induction Type: IV induction Ventilation: Mask ventilation without difficulty and Oral airway inserted - appropriate to patient size Laryngoscope Size: Sabra Heck and 2 Grade View: Grade I Tube type: Oral Tube size: 7.0 mm Number of attempts: 1 Airway Equipment and Method: Stylet Placement Confirmation: ETT inserted through vocal cords under direct vision,  positive ETCO2 and breath sounds checked- equal and bilateral Secured at: 23 cm Tube secured with: Tape Dental Injury: Teeth and Oropharynx as per pre-operative assessment  Comments: Noted that patient front teeth all with chips but according to patient none loose, no change in dentition with intubation

## 2017-10-19 NOTE — Anesthesia Preprocedure Evaluation (Addendum)
Anesthesia Evaluation  Patient identified by MRN, date of birth, ID band Patient awake    Reviewed: Allergy & Precautions, H&P , NPO status , Patient's Chart, lab work & pertinent test results, reviewed documented beta blocker date and time   Airway Mallampati: III  TM Distance: >3 FB Neck ROM: full    Dental no notable dental hx. (+) Teeth Intact, Chipped, Poor Dentition   Pulmonary neg pulmonary ROS,    Pulmonary exam normal breath sounds clear to auscultation       Cardiovascular Exercise Tolerance: Good hypertension, negative cardio ROS Normal cardiovascular exam Rhythm:regular Rate:Normal     Neuro/Psych negative neurological ROS  negative psych ROS   GI/Hepatic negative GI ROS, Neg liver ROS,   Endo/Other  negative endocrine ROSdiabetes  Renal/GU negative Renal ROS  negative genitourinary   Musculoskeletal   Abdominal   Peds  Hematology negative hematology ROS (+)   Anesthesia Other Findings Cardiology report reviewed; Patient without clinical complaints... Explained risks to procedure and she understands and wishes to proceed Most recent Echocardiogram and EKG reviewed   Reproductive/Obstetrics negative OB ROS                            Anesthesia Physical Anesthesia Plan  ASA: III  Anesthesia Plan: General   Post-op Pain Management:    Induction:   PONV Risk Score and Plan:   Airway Management Planned:   Additional Equipment:   Intra-op Plan:   Post-operative Plan:   Informed Consent: I have reviewed the patients History and Physical, chart, labs and discussed the procedure including the risks, benefits and alternatives for the proposed anesthesia with the patient or authorized representative who has indicated his/her understanding and acceptance.   Dental Advisory Given  Plan Discussed with: CRNA  Anesthesia Plan Comments:         Anesthesia Quick  Evaluation

## 2017-10-19 NOTE — Addendum Note (Signed)
Addendum  created 10/19/17 1200 by Adalberto Ill, CRNA   Charge Capture section accepted

## 2017-10-19 NOTE — Op Note (Signed)
Patient:  Miranda Sexton  DOB:  06-May-1941  MRN:  [id number]   Preop Diagnosis: High-grade dysplasia of polyp, ascending colon  Postop Diagnosis: Same  Procedure: Right hemicolectomy  Surgeon: [name], MD  Assistant: Blake Divine, MD  Anes: General endotracheal  Indications: Patient is a 77 year old black female who was found on colonoscopy to have high-grade dysplasia of a large ascending colon polyp which could not be removed endoscopically.  The risks and benefits of the procedure including bleeding, infection, anastomotic leak, and the possibility of blood transfusion were fully explained to the patient, who gave informed consent.  Procedure note: The patient was placed in the supine position.  After induction of general endotracheal anesthesia, the abdomen was prepped and draped using the usual sterile technique with ChloraPrep.  Surgical site confirmation was performed.  Incision was made from above the umbilicus to the umbilicus.  The peritoneal cavity was entered into without difficulty.  Liver was inspected and noted to be within normal limits.  The right colon was mobilized along its peritoneal reflection.  The pelvic flexure was taken down using the LigaSure.  A GIA-75 stapler was placed across the proximal transverse colon and fired.  This was likewise done as the terminal ileum.  The mesentery of the right colon was then divided using the LigaSure.  The specimen was removed from the operative field.  The specimen was opened in the operating room and the large polypoid lesion was noted in the mid ascending colon.  Margins were adequate.  A side-to-side ileocolic anastomosis was then performed using a GIA-75 stapler.  The enterotomy was closed using a TA 60 stapler.  The staple line was bolstered using 3-0 silk sutures.  Surrounding omentum was placed over the anastomosis and secured using a 3-0 silk suture.  Mesenteric defect was closed using a 2-0 chromic running suture.   The bowel was then returned into the abdominal cavity in an orderly fashion.  The abdominal cavity was copiously irrigated with normal saline.  All operating personnel then changed gown and gloves.  A new setup was used for closure.  The fascia was reapproximated using a looped 0 Novafil running suture.  Exparel was instilled into the surrounding wound.  The subcutaneous layer was irrigated with normal saline.  The skin was closed using staples.  Betadine ointment and dry sterile dressings were applied.  All tape and needle counts were correct at the end of the procedure.  The patient was extubated in the operating room and transferred to PACU in stable condition.  Complications: None  EBL: 50 cc  Specimen: Ascending colon

## 2017-10-20 ENCOUNTER — Encounter (HOSPITAL_COMMUNITY): Payer: Self-pay | Admitting: General Surgery

## 2017-10-20 ENCOUNTER — Encounter: Payer: Self-pay | Admitting: Cardiology

## 2017-10-20 LAB — BASIC METABOLIC PANEL
ANION GAP: 11 (ref 5–15)
BUN: 11 mg/dL (ref 6–20)
CHLORIDE: 96 mmol/L — AB (ref 101–111)
CO2: 30 mmol/L (ref 22–32)
Calcium: 8.6 mg/dL — ABNORMAL LOW (ref 8.9–10.3)
Creatinine, Ser: 1.07 mg/dL — ABNORMAL HIGH (ref 0.44–1.00)
GFR calc Af Amer: 57 mL/min — ABNORMAL LOW (ref >60)
GFR, EST NON AFRICAN AMERICAN: 49 mL/min — AB (ref >60)
GLUCOSE: 152 mg/dL — AB (ref 65–99)
POTASSIUM: 3.9 mmol/L (ref 3.5–5.1)
Sodium: 137 mmol/L (ref 135–145)

## 2017-10-20 LAB — CBC
HCT: 30.8 % — ABNORMAL LOW (ref 36.0–46.0)
HEMOGLOBIN: 9.3 g/dL — AB (ref 12.0–15.0)
MCH: 25.1 pg — AB (ref 26.0–34.0)
MCHC: 30.2 g/dL (ref 30.0–36.0)
MCV: 83.2 fL (ref 78.0–100.0)
PLATELETS: 324 10*3/uL (ref 150–400)
RBC: 3.7 MIL/uL — AB (ref 3.87–5.11)
RDW: 15.9 % — ABNORMAL HIGH (ref 11.5–15.5)
WBC: 6.9 10*3/uL (ref 4.0–10.5)

## 2017-10-20 LAB — GLUCOSE, CAPILLARY
GLUCOSE-CAPILLARY: 151 mg/dL — AB (ref 65–99)
GLUCOSE-CAPILLARY: 272 mg/dL — AB (ref 65–99)
Glucose-Capillary: 199 mg/dL — ABNORMAL HIGH (ref 65–99)
Glucose-Capillary: 171 mg/dL — ABNORMAL HIGH (ref 65–99)

## 2017-10-20 LAB — PHOSPHORUS: Phosphorus: 3.7 mg/dL (ref 2.5–4.6)

## 2017-10-20 LAB — MAGNESIUM: MAGNESIUM: 1.3 mg/dL — AB (ref 1.7–2.4)

## 2017-10-20 MED ORDER — INSULIN ASPART 100 UNIT/ML ~~LOC~~ SOLN
0.0000 [IU] | Freq: Three times a day (TID) | SUBCUTANEOUS | Status: DC
  Administered 2017-10-20: 4 [IU] via SUBCUTANEOUS
  Administered 2017-10-20: 11 [IU] via SUBCUTANEOUS
  Administered 2017-10-21 (×2): 3 [IU] via SUBCUTANEOUS
  Administered 2017-10-21: 4 [IU] via SUBCUTANEOUS
  Administered 2017-10-22 (×2): 3 [IU] via SUBCUTANEOUS

## 2017-10-20 MED ORDER — MAGNESIUM SULFATE 4 GM/100ML IV SOLN
4.0000 g | Freq: Once | INTRAVENOUS | Status: AC
  Administered 2017-10-20: 4 g via INTRAVENOUS
  Filled 2017-10-20: qty 100

## 2017-10-20 MED ORDER — INSULIN ASPART 100 UNIT/ML ~~LOC~~ SOLN: 0.0000 [IU] | Freq: Every day | SUBCUTANEOUS | Status: DC

## 2017-10-20 NOTE — Progress Notes (Signed)
1 Day Post-Op  Subjective: Patient resting comfortably.  No nausea or vomiting.  Minimal incisional pain.  Objective: Vital signs in last 24 hours: Temp:  [97.6 F (36.4 C)-98.7 F (37.1 C)] 98.2 F (36.8 C) (04/18 0551) Pulse Rate:  [57-74] 59 (04/18 0601) Resp:  [7-20] 20 (04/18 0551) BP: (86-133)/(43-60) 96/43 (04/18 0601) SpO2:  [94 %-100 %] 100 % (04/18 0551) Last BM Date: 10/19/17  Intake/Output from previous day: 04/17 0701 - 04/18 0700 In: 3071.3 [P.O.:510; I.V.:2561.3] Out: 575 [Urine:525; Blood:50] Intake/Output this shift: No intake/output data recorded.  General appearance: alert, cooperative and no distress Resp: clear to auscultation bilaterally Cardio: regular rate and rhythm, S1, S2 normal, no murmur, click, rub or gallop GI: Soft, minimal bowel sounds appreciated.  Incision healing well.  Lab Results:  Recent Labs    10/20/17 0617  WBC 6.9  HGB 9.3*  HCT 30.8*  PLT 324   BMET Recent Labs    10/20/17 0617  NA 137  K 3.9  CL 96*  CO2 30  GLUCOSE 152*  BUN 11  CREATININE 1.07*  CALCIUM 8.6*   PT/INR No results for input(s): LABPROT, INR in the last 72 hours.  Studies/Results: No results found.  Anti-infectives: Anti-infectives (From admission, onward)   Start     Dose/Rate Route Frequency Ordered Stop   10/19/17 0636  metroNIDAZOLE (FLAGYL) IVPB 500 mg     500 mg 100 mL/hr over 60 Minutes Intravenous On call to O.R. 10/19/17 0636 10/19/17 0800   10/19/17 0636  ciprofloxacin (CIPRO) IVPB 400 mg     400 mg 200 mL/hr over 60 Minutes Intravenous On call to O.R. 10/19/17 0636 10/19/17 0803      Assessment/Plan: s/p Procedure(s): PARTIAL COLECTOMY Impression: Stable on postoperative day 1.  Patient's blood pressure somewhat fluctuates, but patient feels well.  Hypomagnesemia noted and will be addressed.  Up to chair.  Awaiting return of bowel function.  LOS: 1 day    [name] 10/20/2017

## 2017-10-20 NOTE — Anesthesia Postprocedure Evaluation (Signed)
Anesthesia Post Note  Patient: Miranda Sexton  Procedure(s) Performed: PARTIAL COLECTOMY (N/A Abdomen)  Patient location during evaluation: Nursing Unit Anesthesia Type: General Level of consciousness: awake and alert and patient cooperative Pain management: pain level controlled Vital Signs Assessment: post-procedure vital signs reviewed and stable Respiratory status: spontaneous breathing, nonlabored ventilation and respiratory function stable Cardiovascular status: blood pressure returned to baseline Postop Assessment: no apparent nausea or vomiting Anesthetic complications: no     Last Vitals:  Vitals:   10/20/17 0601 10/20/17 1357  BP: (!) 96/43 (!) 107/50  Pulse: (!) 59 (!) 59  Resp:  17  Temp:  36.6 C  SpO2:  100%    Last Pain:  Vitals:   10/20/17 1357  TempSrc: Oral  PainSc:                  Saveah Bahar J

## 2017-10-20 NOTE — Plan of Care (Signed)
Progressing

## 2017-10-20 NOTE — Progress Notes (Signed)
Inpatient Diabetes Program Recommendations  AACE/ADA: New Consensus Statement on Inpatient Glycemic Control (2015)  Target Ranges:  Prepandial:   less than 140 mg/dL      Peak postprandial:   less than 180 mg/dL (1-2 hours)      Critically ill patients:  140 - 180 mg/dL  Results for ELIA, NUNLEY (MRN [id number]) as of 10/20/2017 07:29  Ref. Range 10/20/2017 06:17  Glucose Latest Ref Range: 65 - 99 mg/dL 152 (H)   Results for KIHANNA, KAMIYA (MRN [id number]) as of 10/20/2017 07:29  Ref. Range 10/19/2017 06:29 10/19/2017 09:11 10/19/2017 11:33 10/19/2017 16:39 10/19/2017 21:53  Glucose-Capillary Latest Ref Range: 65 - 99 mg/dL 107 (H) 172 (H) 173 (H) 268 (H) 191 (H)  Results for JO-ANN, JOHANNING (MRN [id number]) as of 10/20/2017 07:29  Ref. Range 10/19/2017 13:03  Hemoglobin A1C Latest Ref Range: 4.8 - 5.6 % 7.7 (H)   Review of Glycemic Control  Diabetes history: DM2 Outpatient Diabetes medications: Glipizide XL 10 mg BID, Metformin 500 mg BID Current orders for Inpatient glycemic control: Novolog 0-20 units TID with meals  Inpatient Diabetes Program Recommendations:  Correction (SSI): Please consider ordering Novolog 0-5 units QHS for bedtime correction. Insulin-Meal Coverage: Once diet is advanced, please consider ordering Novolog 3 units TID with meals if patient eats at least 50% of of meals (in addition to Novolog correction scale). HgbA1C: A1C 7.7% on 10/19/2017 indicating an average glucose of 174 mg/dl over the past 2-3 months.  Thanks, Barnie Alderman, RN, MSN, CDE Diabetes Coordinator Inpatient Diabetes Program [phone] (Team Pager from 8am to 5pm)

## 2017-10-20 NOTE — Anesthesia Postprocedure Evaluation (Signed)
Anesthesia Post Note  Patient: Miranda Sexton  Procedure(s) Performed: PARTIAL COLECTOMY (N/A Abdomen)  Patient location during evaluation: Nursing Unit Anesthesia Type: General Level of consciousness: awake and alert and patient cooperative Pain management: pain level controlled Vital Signs Assessment: post-procedure vital signs reviewed and stable Respiratory status: spontaneous breathing, nonlabored ventilation and respiratory function stable Cardiovascular status: blood pressure returned to baseline Postop Assessment: no apparent nausea or vomiting Anesthetic complications: no     Last Vitals:  Vitals:   10/20/17 0601 10/20/17 1357  BP: (!) 96/43 (!) 107/50  Pulse: (!) 59 (!) 59  Resp:  17  Temp:  36.6 C  SpO2:  100%    Last Pain:  Vitals:   10/20/17 1357  TempSrc: Oral  PainSc:                  Diella Gillingham J

## 2017-10-20 NOTE — Addendum Note (Signed)
Addendum  created 10/20/17 1513 by Charmaine Downs, CRNA   Sign clinical note

## 2017-10-21 LAB — GLUCOSE, CAPILLARY
GLUCOSE-CAPILLARY: 147 mg/dL — AB (ref 65–99)
GLUCOSE-CAPILLARY: 86 mg/dL (ref 65–99)
Glucose-Capillary: 151 mg/dL — ABNORMAL HIGH (ref 65–99)
Glucose-Capillary: 131 mg/dL — ABNORMAL HIGH (ref 65–99)

## 2017-10-21 LAB — BASIC METABOLIC PANEL
ANION GAP: 9 (ref 5–15)
BUN: 9 mg/dL (ref 6–20)
CALCIUM: 8.6 mg/dL — AB (ref 8.9–10.3)
CO2: 30 mmol/L (ref 22–32)
CREATININE: 0.96 mg/dL (ref 0.44–1.00)
Chloride: 98 mmol/L — ABNORMAL LOW (ref 101–111)
GFR calc non Af Amer: 56 mL/min — ABNORMAL LOW (ref >60)
Glucose, Bld: 149 mg/dL — ABNORMAL HIGH (ref 65–99)
Potassium: 3.7 mmol/L (ref 3.5–5.1)
SODIUM: 137 mmol/L (ref 135–145)

## 2017-10-21 LAB — PHOSPHORUS: PHOSPHORUS: 3.1 mg/dL (ref 2.5–4.6)

## 2017-10-21 LAB — CBC
HEMATOCRIT: 30.6 % — AB (ref 36.0–46.0)
Hemoglobin: 9.2 g/dL — ABNORMAL LOW (ref 12.0–15.0)
MCH: 25.1 pg — ABNORMAL LOW (ref 26.0–34.0)
MCHC: 30.1 g/dL (ref 30.0–36.0)
MCV: 83.6 fL (ref 78.0–100.0)
PLATELETS: 307 10*3/uL (ref 150–400)
RBC: 3.66 MIL/uL — ABNORMAL LOW (ref 3.87–5.11)
RDW: 15.9 % — AB (ref 11.5–15.5)
WBC: 7.2 10*3/uL (ref 4.0–10.5)

## 2017-10-21 LAB — MAGNESIUM: MAGNESIUM: 2 mg/dL (ref 1.7–2.4)

## 2017-10-21 MED ORDER — METFORMIN HCL 500 MG PO TABS
500.0000 mg | ORAL_TABLET | Freq: Two times a day (BID) | ORAL | Status: DC
  Administered 2017-10-21 – 2017-10-22 (×2): 500 mg via ORAL
  Filled 2017-10-21 (×2): qty 1

## 2017-10-21 MED ORDER — APIXABAN 5 MG PO TABS
5.0000 mg | ORAL_TABLET | Freq: Two times a day (BID) | ORAL | Status: DC
  Administered 2017-10-21 – 2017-10-22 (×3): 5 mg via ORAL
  Filled 2017-10-21 (×3): qty 1

## 2017-10-21 MED ORDER — GLIPIZIDE ER 5 MG PO TB24
10.0000 mg | ORAL_TABLET | Freq: Every day | ORAL | Status: DC
  Administered 2017-10-21 – 2017-10-22 (×2): 10 mg via ORAL
  Filled 2017-10-21 (×2): qty 2

## 2017-10-21 NOTE — Care Management Important Message (Signed)
Important Message  Patient Details  Name: Miranda Sexton MRN: [id number] Date of Birth: 1940-07-28   Medicare Important Message Given:  Yes    Avari Gelles, Chauncey Reading, RN 10/21/2017, 11:27 AM

## 2017-10-21 NOTE — Progress Notes (Signed)
2 Days Post-Op  Subjective: Patient has had 2 bowel movements.  She has no complaints.  Objective: Vital signs in last 24 hours: Temp:  [97.9 F (36.6 C)-98.6 F (37 C)] 98.3 F (36.8 C) (04/19 0600) Pulse Rate:  [58-61] 60 (04/19 0600) Resp:  [17-19] 18 (04/19 0600) BP: (71-145)/(42-62) 145/53 (04/19 0600) SpO2:  [92 %-100 %] 100 % (04/19 0600) Last BM Date: 10/20/17  Intake/Output from previous day: 04/18 0701 - 04/19 0700 In: 2670 [P.O.:840; I.V.:1830] Out: 4900 [Urine:4900] Intake/Output this shift: No intake/output data recorded.  General appearance: alert, cooperative and no distress Resp: clear to auscultation bilaterally Cardio: regular rate and rhythm, S1, S2 normal, no murmur, click, rub or gallop GI: Soft, incision healing well.  Bowel sounds active.  Lab Results:  Recent Labs    10/20/17 0617 10/21/17 0558  WBC 6.9 7.2  HGB 9.3* 9.2*  HCT 30.8* 30.6*  PLT 324 307   BMET Recent Labs    10/20/17 0617 10/21/17 0558  NA 137 137  K 3.9 3.7  CL 96* 98*  CO2 30 30  GLUCOSE 152* 149*  BUN 11 9  CREATININE 1.07* 0.96  CALCIUM 8.6* 8.6*   PT/INR No results for input(s): LABPROT, INR in the last 72 hours.  Studies/Results: No results found.  Anti-infectives: Anti-infectives (From admission, onward)   Start     Dose/Rate Route Frequency Ordered Stop   10/19/17 0636  metroNIDAZOLE (FLAGYL) IVPB 500 mg     500 mg 100 mL/hr over 60 Minutes Intravenous On call to O.R. 10/19/17 0636 10/19/17 0800   10/19/17 0636  ciprofloxacin (CIPRO) IVPB 400 mg     400 mg 200 mL/hr over 60 Minutes Intravenous On call to O.R. 10/19/17 0636 10/19/17 0803      Assessment/Plan: s/p Procedure(s): PARTIAL COLECTOMY Impression: Stable on postoperative day 2.  Bowel function has returned.  We will advance diet.  Will restart Eliquis.  Final pathology negative for invasive carcinoma.  Anticipate discharge in next 24-48 hours.  LOS: 2 days    [name] 10/21/2017

## 2017-10-22 LAB — GLUCOSE, CAPILLARY
GLUCOSE-CAPILLARY: 125 mg/dL — AB (ref 65–99)
GLUCOSE-CAPILLARY: 139 mg/dL — AB (ref 65–99)

## 2017-10-22 MED ORDER — TRAMADOL HCL 50 MG PO TABS: 50.0000 mg | ORAL_TABLET | Freq: Four times a day (QID) | ORAL | 0 refills | Status: DC | PRN

## 2017-10-22 NOTE — Discharge Instructions (Signed)
Open Colectomy, Care After °This sheet gives you information about how to care for yourself after your procedure. Your health care provider may also give you more specific instructions. If you have problems or questions, contact your health care provider. °What can I expect after the procedure? °After the procedure, it is common to have: °· Pain in your abdomen, especially along your incision. °· Tiredness. Your energy level will return to normal over the next several weeks. °· Constipation. °· Nausea. °· Difficulty urinating. ° °Follow these instructions at home: °Activity °· You may be able to return to most of your normal activities within 1-2 weeks, such as working, walking up stairs, and sexual activity. °· Avoid activities that require a lot of energy for 4-6 weeks after surgery, such as running, climbing, and lifting heavy objects. Ask your health care provider what activities are safe for you. °· Take rest breaks during the day as needed. °· Do not drive for 1-2 weeks or until your health care provider says that it is safe. °· Do not drive or use heavy machinery while taking prescription pain medicines. °· Do not lift anything that is heavier than 10 lb (4.3 kg) until your health care provider says that it is safe. °Incision care °· Follow instructions from your health care provider about how to take care of your incision. Make sure you: °? Wash your hands with soap and water before you change your bandage (dressing). If soap and water are not available, use hand sanitizer. °? Change your dressing as told by your health care provider. °? Leave stitches (sutures) or staples in place. These skin closures may need to stay in place for 2 weeks or longer. °· Avoid wearing tight clothing around your incision. °· Protect your incision area from the sun. °· Check your incision area every day for signs of infection. Check for: °? More redness, swelling, or pain. °? More fluid or blood. °? Warmth. °? Pus or a bad  smell. °General instructions °· Do not take baths, swim, or use a hot tub until your health care provider approves. Ask your health care provider when you may shower. °· Take over-the-counter and prescription medicines, including stool softeners, only as told by your health care provider. °· Eat a low-fat and low-fiber diet for the first 4 weeks after surgery. °· Keep all follow-up visits as told by your health care provider. This is important. °Contact a health care provider if: °· You have more redness, swelling, or pain around your incision. °· You have more fluid or blood coming from your incision. °· Your incision feels warm to the touch. °· You have pus or a bad smell coming from your incision. °· You have a fever or chills. °· You do not have a bowel movement 2-3 days after surgery. °· You cannot eat or drink for 24 hours or more. °· You have persistent nausea and vomiting. °· You have abdominal pain that gets worse and does not get better with medicine. °Get help right away if: °· You have chest pain. °· You have shortness of breath. °· You have pain or swelling in your legs. °· Your incision breaks open after your sutures or staples have been removed. °· You have bleeding from the rectum. °This information is not intended to replace advice given to you by your health care provider. Make sure you discuss any questions you have with your health care provider. °Document Released: 01/12/2011 Document Revised: 03/22/2016 Document Reviewed: 03/22/2016 °Elsevier Interactive Patient   Education © 2018 [employer] ° °

## 2017-10-22 NOTE — Discharge Summary (Signed)
Physician Discharge Summary  Patient ID: Miranda Sexton MRN: [id number] DOB/AGE: 04-Oct-1940 77 y.o.  Admit date: 10/19/2017 Discharge date: 10/22/2017  Admission Diagnoses: Dysplastic colon polyp  Discharge Diagnoses: Same Active Problems:   S/P partial colectomy   Dysplastic colon polyp Hypertension, insulin-dependent diabetes mellitus, pacemaker in place  Discharged Condition: good  Hospital Course: Patient is a 77 year old black female who was referred to my care for removal of a dysplastic colon polyp in the ascending colon.  She underwent a right hemicolectomy on 10/19/2017.  She tolerated procedure well.  Her postoperative course was remarkable for hypomagnesemia which was addressed.  Her diet was advanced without difficulty once her bowel function returned.  Final pathology was negative for invasive carcinoma.  Patient is being discharged home on 10/22/2017 in good and improving condition.  Treatments: surgery: Right hemicolectomy on 10/19/2017  Discharge Exam: Blood pressure (!) 136/52, pulse 61, temperature 98 F (36.7 C), temperature source Oral, resp. rate (!) 21, height 5\' 5"  (1.651 m), weight 229 lb (103.9 kg), SpO2 93 %. General appearance: alert, cooperative and no distress Resp: clear to auscultation bilaterally Cardio: regular rate and rhythm, S1, S2 normal, no murmur, click, rub or gallop GI: Soft, incision healing well.  Bowel sounds active.  Disposition: Discharge disposition: 01-Home or Self Care       Discharge Instructions    Diet Carb Modified   Complete by:  As directed    Increase activity slowly   Complete by:  As directed      Allergies as of 10/22/2017      Reactions   Codeine Nausea And Vomiting   Penicillins Rash, Other (See Comments)   Has patient had a PCN reaction causing immediate rash, facial/tongue/throat swelling, SOB or lightheadedness with hypotension: Yes Has patient had a PCN reaction causing severe rash involving mucus membranes or  skin necrosis: No Has patient had a PCN reaction that required hospitalization No Has patient had a PCN reaction occurring within the last 10 years: No If all of the above answers are "NO", then may proceed with Cephalosporin use.      Medication List    STOP taking these medications   metroNIDAZOLE 500 MG tablet Commonly known as:  FLAGYL   neomycin 500 MG tablet Commonly known as:  MYCIFRADIN     TAKE these medications   atorvastatin 40 MG tablet Commonly known as:  LIPITOR Take 40 mg by mouth daily.   benazepril 40 MG tablet Commonly known as:  LOTENSIN Take 40 mg by mouth daily.   ELIQUIS 5 MG Tabs tablet Generic drug:  apixaban Take 1 tablet (5 mg total) by mouth 2 (two) times daily.   flecainide 50 MG tablet Commonly known as:  TAMBOCOR Take 1 tablet (50 mg total) by mouth 2 (two) times daily. What changed:  when to take this   furosemide 40 MG tablet Commonly known as:  LASIX take 1 tablet by mouth once daily What changed:    how much to take  how to take this  when to take this   GAVILYTE-G 236 g solution Generic drug:  polyethylene glycol Take 4,000 mLs by mouth once.   glipiZIDE 10 MG 24 hr tablet Commonly known as:  GLUCOTROL XL Take 10 mg by mouth 2 (two) times daily.   insulin aspart protamine- aspart (70-30) 100 UNIT/ML injection Commonly known as:  NOVOLOG MIX 70/30 Inject 44-56 Units into the skin See admin instructions. Inject 56 units in the morning & 44 units in  the evening.   meclizine 25 MG tablet Commonly known as:  ANTIVERT Take 12.5 mg by mouth 2 (two) times daily as needed for dizziness.   metFORMIN 500 MG tablet Commonly known as:  GLUCOPHAGE Take 500 mg by mouth 2 (two) times daily.   multivitamin with minerals Tabs tablet Take 1 tablet by mouth daily. Centrum Silver   olopatadine 0.1 % ophthalmic solution Commonly known as:  PATANOL Place 1 drop into both eyes 2 (two) times daily.   pantoprazole 40 MG tablet Commonly  known as:  PROTONIX Take 1 tablet (40 mg total) by mouth 2 (two) times daily before a meal. What changed:  when to take this   potassium chloride SA 20 MEQ tablet Commonly known as:  K-DUR,KLOR-CON Take 20 mEq by mouth daily.   PROAIR HFA 108 (90 Base) MCG/ACT inhaler Generic drug:  albuterol Inhale 2 puffs into the lungs every 6 (six) hours as needed for wheezing or shortness of breath.   traMADol 50 MG tablet Commonly known as:  ULTRAM Take 1 tablet (50 mg total) by mouth every 6 (six) hours as needed.   venlafaxine XR 150 MG 24 hr capsule Commonly known as:  EFFEXOR-XR Take 150 mg by mouth daily.      Follow-up Information    [name], MD. Schedule an appointment as soon as possible for a visit on 11/01/2017.   Specialty:  General Surgery Contact information: [id number] [address] [phone]           Signed: [name] 10/22/2017, 10:17 AM

## 2017-10-22 NOTE — Progress Notes (Signed)
Patient states understanding of discharge instructions.  

## 2017-10-27 NOTE — Addendum Note (Signed)
Addendum  created 10/27/17 1814 by Vista Deck, CRNA   Charge Capture section accepted

## 2017-10-28 DIAGNOSIS — Z09 Encounter for follow-up examination after completed treatment for conditions other than malignant neoplasm: Secondary | ICD-10-CM | POA: Diagnosis not present

## 2017-10-28 DIAGNOSIS — Z9889 Other specified postprocedural states: Secondary | ICD-10-CM | POA: Diagnosis not present

## 2017-10-28 DIAGNOSIS — E1165 Type 2 diabetes mellitus with hyperglycemia: Secondary | ICD-10-CM | POA: Diagnosis not present

## 2017-10-28 DIAGNOSIS — Z6836 Body mass index (BMI) 36.0-36.9, adult: Secondary | ICD-10-CM | POA: Diagnosis not present

## 2017-10-28 DIAGNOSIS — I5030 Unspecified diastolic (congestive) heart failure: Secondary | ICD-10-CM | POA: Diagnosis not present

## 2017-10-28 DIAGNOSIS — J449 Chronic obstructive pulmonary disease, unspecified: Secondary | ICD-10-CM | POA: Diagnosis not present

## 2017-10-28 DIAGNOSIS — R69 Illness, unspecified: Secondary | ICD-10-CM | POA: Diagnosis not present

## 2017-10-28 DIAGNOSIS — Z299 Encounter for prophylactic measures, unspecified: Secondary | ICD-10-CM | POA: Diagnosis not present

## 2017-10-28 NOTE — Addendum Note (Signed)
Addendum  created 10/28/17 1230 by Vista Deck, CRNA   Intraprocedure Staff edited

## 2017-10-29 LAB — TYPE AND SCREEN
ABO/RH(D): O POS
ANTIBODY SCREEN: NEGATIVE
UNIT DIVISION: 0
Unit division: 0

## 2017-10-29 LAB — BPAM RBC
BLOOD PRODUCT EXPIRATION DATE: 201905162359
BLOOD PRODUCT EXPIRATION DATE: 201905162359
UNIT TYPE AND RH: 5100
Unit Type and Rh: 5100

## 2017-11-03 ENCOUNTER — Ambulatory Visit (INDEPENDENT_AMBULATORY_CARE_PROVIDER_SITE_OTHER): Payer: Self-pay | Admitting: General Surgery

## 2017-11-03 ENCOUNTER — Encounter: Payer: Self-pay | Admitting: General Surgery

## 2017-11-03 VITALS — BP 157/65 | HR 75 | Temp 98.2°F | Ht 65.0 in | Wt 213.0 lb

## 2017-11-03 DIAGNOSIS — Z09 Encounter for follow-up examination after completed treatment for conditions other than malignant neoplasm: Secondary | ICD-10-CM

## 2017-11-03 NOTE — Progress Notes (Signed)
Subjective:     Miranda Sexton  Status post partial colectomy.  Doing very well.  Her appetite is returned to normal.  Her bowel movements are normal.  She denies any fever, chills, or incisional pain. Objective:    BP (!) 157/65   Pulse 75   Temp 98.2 F (36.8 C)   Ht 5\' 5"  (1.651 m)   Wt 213 lb (96.6 kg)   BMI 35.45 kg/m   General:  alert, cooperative and no distress  Abdomen soft, incision healing well.  Staples removed, Steri-Strips applied. Final pathology reveals no invasive colon cancer.     Assessment:    Doing well postoperatively.    Plan:   Increase activity as able.  Follow-up here as needed.

## 2017-11-06 DIAGNOSIS — R69 Illness, unspecified: Secondary | ICD-10-CM | POA: Diagnosis not present

## 2017-11-09 DIAGNOSIS — I509 Heart failure, unspecified: Secondary | ICD-10-CM | POA: Diagnosis not present

## 2017-11-09 DIAGNOSIS — I5030 Unspecified diastolic (congestive) heart failure: Secondary | ICD-10-CM | POA: Diagnosis not present

## 2017-11-09 DIAGNOSIS — Z789 Other specified health status: Secondary | ICD-10-CM | POA: Diagnosis not present

## 2017-11-09 DIAGNOSIS — Z6836 Body mass index (BMI) 36.0-36.9, adult: Secondary | ICD-10-CM | POA: Diagnosis not present

## 2017-11-09 DIAGNOSIS — Z299 Encounter for prophylactic measures, unspecified: Secondary | ICD-10-CM | POA: Diagnosis not present

## 2017-11-09 DIAGNOSIS — J449 Chronic obstructive pulmonary disease, unspecified: Secondary | ICD-10-CM | POA: Diagnosis not present

## 2017-11-09 DIAGNOSIS — J329 Chronic sinusitis, unspecified: Secondary | ICD-10-CM | POA: Diagnosis not present

## 2017-11-18 ENCOUNTER — Other Ambulatory Visit (INDEPENDENT_AMBULATORY_CARE_PROVIDER_SITE_OTHER): Payer: Self-pay | Admitting: Internal Medicine

## 2017-11-18 DIAGNOSIS — E119 Type 2 diabetes mellitus without complications: Secondary | ICD-10-CM | POA: Diagnosis not present

## 2017-11-18 DIAGNOSIS — I509 Heart failure, unspecified: Secondary | ICD-10-CM | POA: Diagnosis not present

## 2017-11-18 DIAGNOSIS — J449 Chronic obstructive pulmonary disease, unspecified: Secondary | ICD-10-CM | POA: Diagnosis not present

## 2017-12-01 DIAGNOSIS — Z789 Other specified health status: Secondary | ICD-10-CM | POA: Diagnosis not present

## 2017-12-01 DIAGNOSIS — R42 Dizziness and giddiness: Secondary | ICD-10-CM | POA: Diagnosis not present

## 2017-12-01 DIAGNOSIS — Z6836 Body mass index (BMI) 36.0-36.9, adult: Secondary | ICD-10-CM | POA: Diagnosis not present

## 2017-12-01 DIAGNOSIS — Z299 Encounter for prophylactic measures, unspecified: Secondary | ICD-10-CM | POA: Diagnosis not present

## 2017-12-01 DIAGNOSIS — I509 Heart failure, unspecified: Secondary | ICD-10-CM | POA: Diagnosis not present

## 2017-12-01 DIAGNOSIS — J329 Chronic sinusitis, unspecified: Secondary | ICD-10-CM | POA: Diagnosis not present

## 2017-12-01 DIAGNOSIS — I10 Essential (primary) hypertension: Secondary | ICD-10-CM | POA: Diagnosis not present

## 2017-12-01 DIAGNOSIS — J449 Chronic obstructive pulmonary disease, unspecified: Secondary | ICD-10-CM | POA: Diagnosis not present

## 2017-12-08 DIAGNOSIS — Z299 Encounter for prophylactic measures, unspecified: Secondary | ICD-10-CM | POA: Diagnosis not present

## 2017-12-08 DIAGNOSIS — R5383 Other fatigue: Secondary | ICD-10-CM | POA: Diagnosis not present

## 2017-12-08 DIAGNOSIS — I509 Heart failure, unspecified: Secondary | ICD-10-CM | POA: Diagnosis not present

## 2017-12-08 DIAGNOSIS — I10 Essential (primary) hypertension: Secondary | ICD-10-CM | POA: Diagnosis not present

## 2017-12-08 DIAGNOSIS — E1165 Type 2 diabetes mellitus with hyperglycemia: Secondary | ICD-10-CM | POA: Diagnosis not present

## 2017-12-08 DIAGNOSIS — J449 Chronic obstructive pulmonary disease, unspecified: Secondary | ICD-10-CM | POA: Diagnosis not present

## 2017-12-08 DIAGNOSIS — Z6836 Body mass index (BMI) 36.0-36.9, adult: Secondary | ICD-10-CM | POA: Diagnosis not present

## 2017-12-08 DIAGNOSIS — R42 Dizziness and giddiness: Secondary | ICD-10-CM | POA: Diagnosis not present

## 2017-12-08 DIAGNOSIS — Z79899 Other long term (current) drug therapy: Secondary | ICD-10-CM | POA: Diagnosis not present

## 2017-12-09 ENCOUNTER — Encounter: Payer: Medicare HMO | Admitting: Internal Medicine

## 2017-12-13 ENCOUNTER — Encounter: Payer: Self-pay | Admitting: Cardiology

## 2017-12-13 DIAGNOSIS — D649 Anemia, unspecified: Secondary | ICD-10-CM | POA: Diagnosis not present

## 2017-12-14 DIAGNOSIS — D649 Anemia, unspecified: Secondary | ICD-10-CM | POA: Diagnosis not present

## 2017-12-16 ENCOUNTER — Ambulatory Visit (INDEPENDENT_AMBULATORY_CARE_PROVIDER_SITE_OTHER): Payer: Medicare HMO | Admitting: Internal Medicine

## 2017-12-16 ENCOUNTER — Encounter: Payer: Self-pay | Admitting: Internal Medicine

## 2017-12-16 VITALS — BP 164/70 | HR 72 | Ht 65.0 in | Wt 215.0 lb

## 2017-12-16 DIAGNOSIS — I480 Paroxysmal atrial fibrillation: Secondary | ICD-10-CM | POA: Diagnosis not present

## 2017-12-16 DIAGNOSIS — Z959 Presence of cardiac and vascular implant and graft, unspecified: Secondary | ICD-10-CM

## 2017-12-16 DIAGNOSIS — I495 Sick sinus syndrome: Secondary | ICD-10-CM

## 2017-12-16 LAB — CUP PACEART INCLINIC DEVICE CHECK
Brady Statistic AS VP Percent: 0.74 %
Brady Statistic AS VS Percent: 0.03 %
Brady Statistic RA Percent Paced: 94.78 %
Date Time Interrogation Session: 20190614121230
Implantable Lead Implant Date: 20170829
Implantable Pulse Generator Implant Date: 20170829
Lead Channel Impedance Value: 266 Ohm
Lead Channel Pacing Threshold Amplitude: 1.75 V
Lead Channel Pacing Threshold Pulse Width: 1 ms
Lead Channel Sensing Intrinsic Amplitude: 4.375 mV
Lead Channel Setting Pacing Amplitude: 2 V
Lead Channel Setting Pacing Amplitude: 4 V
Lead Channel Setting Pacing Pulse Width: 1 ms
Lead Channel Setting Sensing Sensitivity: 0.9 mV
MDC IDC LEAD IMPLANT DT: 20170829
MDC IDC LEAD LOCATION: 753859
MDC IDC LEAD LOCATION: 753860
MDC IDC MSMT BATTERY REMAINING LONGEVITY: 48 mo
MDC IDC MSMT BATTERY VOLTAGE: 2.98 V
MDC IDC MSMT LEADCHNL RA IMPEDANCE VALUE: 323 Ohm
MDC IDC MSMT LEADCHNL RA IMPEDANCE VALUE: 456 Ohm
MDC IDC MSMT LEADCHNL RA PACING THRESHOLD AMPLITUDE: 0.75 V
MDC IDC MSMT LEADCHNL RA PACING THRESHOLD PULSEWIDTH: 0.4 ms
MDC IDC MSMT LEADCHNL RA SENSING INTR AMPL: 2.25 mV
MDC IDC MSMT LEADCHNL RV IMPEDANCE VALUE: 456 Ohm
MDC IDC STAT BRADY AP VP PERCENT: 99.18 %
MDC IDC STAT BRADY AP VS PERCENT: 0.04 %
MDC IDC STAT BRADY RV PERCENT PACED: 99.44 %

## 2017-12-16 NOTE — Progress Notes (Signed)
PCP: Glenda Chroman, MD Primary Cardiologist: Dr Domenic Polite Primary EP:  Dr Rayann Heman  Miranda Sexton is a 77 y.o. female who presents today for routine electrophysiology followup.  Since last being seen in our clinic, the patient reports doing very well.  Today, she denies symptoms of palpitations, chest pain, shortness of breath,  lower extremity edema,  presyncope, or syncope.  + postural dizziness and anemia. The patient is otherwise without complaint today.   Past Medical History:  Diagnosis Date  . Anxiety   . Asthma   . Carotid artery occlusion   . Chronic bronchitis ([place])   . Chronic diastolic heart failure ([place])   . CKD (chronic kidney disease) stage 2, GFR 60-89 ml/min   . COPD (chronic obstructive pulmonary disease) ([place])   . Coronary atherosclerosis of native coronary artery    Nonobstructive 08/2008  . Daily headache   . DJD (degenerative joint disease)   . Essential hypertension   . GERD (gastroesophageal reflux disease)   . Hyperlipidemia   . Obesity   . On home oxygen therapy   . PAD (peripheral artery disease) ([facility])    Moderate right renal artery stenosis 08/2008  . Paroxysmal atrial fibrillation ([facility])   . Sick sinus syndrome ([facility])    Medtronic dual-chamber his bundle pacemaker - Dr. Rayann Heman  . Type 2 diabetes mellitus ([place])    Past Surgical History:  Procedure Laterality Date  . BIOPSY  08/03/2017   Procedure: BIOPSY;  Surgeon: [name], MD;  [facility];  Service: Endoscopy;;  gastric   . CARDIAC CATHETERIZATION  2014  . CATARACT EXTRACTION W/ INTRAOCULAR LENS  IMPLANT, BILATERAL Bilateral 2008  . COLONOSCOPY N/A 08/03/2017   Procedure: COLONOSCOPY;  Surgeon: [name], MD;  Location: [facility];  Service: Endoscopy;  Laterality: N/A;  . ENDARTERECTOMY Right 12/08/2012   Procedure: ENDARTERECTOMY CAROTID;  Surgeon: [name], MD;  Location: [facility];  Service: Vascular;  Laterality: Right;  . EP IMPLANTABLE DEVICE N/A 03/02/2016   Procedure: Pacemaker Implant;  Surgeon: [name], MD;  Location: [facility];  Service: Cardiovascular;  Laterality: N/A;  . ESOPHAGOGASTRODUODENOSCOPY N/A 08/03/2017   Procedure: ESOPHAGOGASTRODUODENOSCOPY (EGD);  Surgeon: [name], MD;  [facility];  Service: Endoscopy;  Laterality: N/A;  . INSERT / REPLACE / REMOVE PACEMAKER  03/02/2016  . PARTIAL COLECTOMY N/A 10/19/2017   Procedure: PARTIAL COLECTOMY;  Surgeon: [name], MD;  Location: [facility];  Service: General;  Laterality: N/A;  . POLYPECTOMY  08/03/2017   Procedure: POLYPECTOMY;  Surgeon: [name], MD;  [facility];  Service: Endoscopy;;  colon    ROS- all systems are reviewed and negative except as per HPI above  Current Outpatient Medications  Medication Sig Dispense Refill  . albuterol (PROAIR HFA) 108 (90 Base) MCG/ACT inhaler Inhale 2 puffs into the lungs every 6 (six) hours as needed for wheezing or shortness of breath.     Marland Kitchen atorvastatin (LIPITOR) 40 MG tablet Take 40 mg by mouth daily.    . benazepril (LOTENSIN) 40 MG tablet Take 40 mg by mouth daily.    Marland Kitchen ELIQUIS 5 MG TABS tablet Take 1 tablet (5 mg total) by mouth 2 (two) times daily. 60 tablet 0  . flecainide (TAMBOCOR) 50 MG tablet Take 1 tablet (50 mg total) by mouth 2 (two) times daily. (Patient taking differently: Take 50 mg by mouth daily. ) 180 tablet 3  . furosemide (  LASIX) 40 MG tablet take 1 tablet by mouth once daily (Patient taking differently: TAKE 1 TABLET (40 MG) BY MOUTH DAILY IN THE MORNING, MAY TAKE AN ADDITIONAL DOSE IN THE AFTERNOON IF NEEDED FOR FLUID RETENTION.) 15 tablet 0  . GAVILYTE-G 236 g solution Take 4,000 mLs by mouth once.  0  . glipiZIDE (GLUCOTROL XL) 10 MG 24 hr tablet Take 10 mg by mouth 2 (two) times daily.     . insulin aspart protamine- aspart (NOVOLOG 70/30) (70-30) 100 UNIT/ML injection Inject 44-56 Units into the skin See admin instructions. Inject 56 units in the morning & 44 units  in the evening.    . meclizine (ANTIVERT) 25 MG tablet Take 12.5 mg by mouth 2 (two) times daily as needed for dizziness.    . metFORMIN (GLUCOPHAGE) 500 MG tablet Take 500 mg by mouth 2 (two) times daily.     . Multiple Vitamin (MULTIVITAMIN WITH MINERALS) TABS tablet Take 1 tablet by mouth daily. Centrum Silver    . olopatadine (PATANOL) 0.1 % ophthalmic solution Place 1 drop into both eyes 2 (two) times daily.     . pantoprazole (PROTONIX) 40 MG tablet TAKE 1 TABLET BY MOUTH TWICE A DAY BEFORE MEALS 60 tablet 0  . potassium chloride SA (K-DUR,KLOR-CON) 20 MEQ tablet Take 20 mEq by mouth daily.    . traMADol (ULTRAM) 50 MG tablet Take 1 tablet (50 mg total) by mouth every 6 (six) hours as needed. 25 tablet 0  . venlafaxine XR (EFFEXOR-XR) 150 MG 24 hr capsule Take 150 mg by mouth daily.     No current facility-administered medications for this visit.     Physical Exam: Vitals:   12/16/17 1003  BP: (!) 164/70  Pulse: 72  SpO2: 97%  Weight: 215 lb (97.5 kg)  Height: 5\' 5"  (1.651 m)    GEN- The patient is well appearing, alert and oriented x 3 today.   Head- normocephalic, atraumatic Eyes-  Sclera clear, conjunctiva pink Ears- hearing intact Oropharynx- clear Lungs- Clear to ausculation bilaterally, normal work of breathing Chest- pacemaker pocket is well healed Heart- Regular rate and rhythm, no murmurs, rubs or gallops, PMI not laterally displaced GI- soft, NT, ND, + BS Extremities- no clubbing, cyanosis, or edema  Pacemaker interrogation- reviewed in detail today,  See PACEART report    Assessment and Plan:  1. Symptomatic sinus bradycardia with first degree V block Normal pacemaker function Chronically elevated His threshold noted.  Its actually a fair bit better with reduction in flecainide (1.75V @ 1 msec today,  last visit 3.5V @ 1 msec) See Pace Art report No changes today  2. Persistent afib afib burden is <0.1 % with low dose flecainide Previously off of  eliquis previously due to prior GI bleed She is now back on eliquis (chads2vasc score is 6).  Given low AF burden, would consider stopping eliquis until her anemia resolved.  I will defer to PCP.  3. HTN Stable No change required today  [facility] Return to see me in 6 months  [name] MD, [facility] 12/16/2017 10:45 AM

## 2017-12-16 NOTE — Patient Instructions (Addendum)

## 2017-12-17 ENCOUNTER — Other Ambulatory Visit: Payer: Self-pay | Admitting: Internal Medicine

## 2017-12-17 ENCOUNTER — Other Ambulatory Visit (INDEPENDENT_AMBULATORY_CARE_PROVIDER_SITE_OTHER): Payer: Self-pay | Admitting: Internal Medicine

## 2017-12-21 DIAGNOSIS — E1159 Type 2 diabetes mellitus with other circulatory complications: Secondary | ICD-10-CM | POA: Diagnosis not present

## 2017-12-21 DIAGNOSIS — J449 Chronic obstructive pulmonary disease, unspecified: Secondary | ICD-10-CM | POA: Diagnosis not present

## 2017-12-21 DIAGNOSIS — I110 Hypertensive heart disease with heart failure: Secondary | ICD-10-CM | POA: Diagnosis not present

## 2017-12-21 DIAGNOSIS — E785 Hyperlipidemia, unspecified: Secondary | ICD-10-CM | POA: Diagnosis not present

## 2017-12-21 DIAGNOSIS — I509 Heart failure, unspecified: Secondary | ICD-10-CM | POA: Diagnosis not present

## 2017-12-21 DIAGNOSIS — Z794 Long term (current) use of insulin: Secondary | ICD-10-CM | POA: Diagnosis not present

## 2017-12-21 DIAGNOSIS — R69 Illness, unspecified: Secondary | ICD-10-CM | POA: Diagnosis not present

## 2017-12-21 DIAGNOSIS — E669 Obesity, unspecified: Secondary | ICD-10-CM | POA: Diagnosis not present

## 2017-12-21 DIAGNOSIS — I4891 Unspecified atrial fibrillation: Secondary | ICD-10-CM | POA: Diagnosis not present

## 2017-12-21 DIAGNOSIS — E1151 Type 2 diabetes mellitus with diabetic peripheral angiopathy without gangrene: Secondary | ICD-10-CM | POA: Diagnosis not present

## 2017-12-26 DIAGNOSIS — Z95810 Presence of automatic (implantable) cardiac defibrillator: Secondary | ICD-10-CM | POA: Diagnosis not present

## 2017-12-26 DIAGNOSIS — J069 Acute upper respiratory infection, unspecified: Secondary | ICD-10-CM | POA: Diagnosis not present

## 2017-12-26 DIAGNOSIS — J449 Chronic obstructive pulmonary disease, unspecified: Secondary | ICD-10-CM | POA: Diagnosis not present

## 2017-12-26 DIAGNOSIS — E1165 Type 2 diabetes mellitus with hyperglycemia: Secondary | ICD-10-CM | POA: Diagnosis not present

## 2017-12-26 DIAGNOSIS — J189 Pneumonia, unspecified organism: Secondary | ICD-10-CM | POA: Diagnosis not present

## 2017-12-26 DIAGNOSIS — Z299 Encounter for prophylactic measures, unspecified: Secondary | ICD-10-CM | POA: Diagnosis not present

## 2017-12-26 DIAGNOSIS — I700 Atherosclerosis of aorta: Secondary | ICD-10-CM | POA: Diagnosis not present

## 2017-12-26 DIAGNOSIS — R05 Cough: Secondary | ICD-10-CM | POA: Diagnosis not present

## 2017-12-26 DIAGNOSIS — Z6836 Body mass index (BMI) 36.0-36.9, adult: Secondary | ICD-10-CM | POA: Diagnosis not present

## 2017-12-26 DIAGNOSIS — I10 Essential (primary) hypertension: Secondary | ICD-10-CM | POA: Diagnosis not present

## 2017-12-27 DIAGNOSIS — J449 Chronic obstructive pulmonary disease, unspecified: Secondary | ICD-10-CM | POA: Diagnosis not present

## 2017-12-27 DIAGNOSIS — E119 Type 2 diabetes mellitus without complications: Secondary | ICD-10-CM | POA: Diagnosis not present

## 2017-12-27 DIAGNOSIS — I509 Heart failure, unspecified: Secondary | ICD-10-CM | POA: Diagnosis not present

## 2017-12-31 DIAGNOSIS — K219 Gastro-esophageal reflux disease without esophagitis: Secondary | ICD-10-CM | POA: Diagnosis not present

## 2017-12-31 DIAGNOSIS — E119 Type 2 diabetes mellitus without complications: Secondary | ICD-10-CM | POA: Diagnosis not present

## 2017-12-31 DIAGNOSIS — R05 Cough: Secondary | ICD-10-CM | POA: Diagnosis not present

## 2017-12-31 DIAGNOSIS — J209 Acute bronchitis, unspecified: Secondary | ICD-10-CM | POA: Diagnosis not present

## 2017-12-31 DIAGNOSIS — I10 Essential (primary) hypertension: Secondary | ICD-10-CM | POA: Diagnosis not present

## 2017-12-31 DIAGNOSIS — J189 Pneumonia, unspecified organism: Secondary | ICD-10-CM | POA: Diagnosis not present

## 2017-12-31 DIAGNOSIS — Z950 Presence of cardiac pacemaker: Secondary | ICD-10-CM | POA: Diagnosis not present

## 2017-12-31 DIAGNOSIS — J440 Chronic obstructive pulmonary disease with acute lower respiratory infection: Secondary | ICD-10-CM | POA: Diagnosis not present

## 2017-12-31 DIAGNOSIS — Z79899 Other long term (current) drug therapy: Secondary | ICD-10-CM | POA: Diagnosis not present

## 2017-12-31 DIAGNOSIS — Z794 Long term (current) use of insulin: Secondary | ICD-10-CM | POA: Diagnosis not present

## 2018-01-15 ENCOUNTER — Other Ambulatory Visit (INDEPENDENT_AMBULATORY_CARE_PROVIDER_SITE_OTHER): Payer: Self-pay | Admitting: Internal Medicine

## 2018-01-19 DIAGNOSIS — E1165 Type 2 diabetes mellitus with hyperglycemia: Secondary | ICD-10-CM | POA: Diagnosis not present

## 2018-01-19 DIAGNOSIS — Z713 Dietary counseling and surveillance: Secondary | ICD-10-CM | POA: Diagnosis not present

## 2018-01-19 DIAGNOSIS — Z299 Encounter for prophylactic measures, unspecified: Secondary | ICD-10-CM | POA: Diagnosis not present

## 2018-01-19 DIAGNOSIS — Z6836 Body mass index (BMI) 36.0-36.9, adult: Secondary | ICD-10-CM | POA: Diagnosis not present

## 2018-01-19 DIAGNOSIS — I10 Essential (primary) hypertension: Secondary | ICD-10-CM | POA: Diagnosis not present

## 2018-01-23 DIAGNOSIS — J449 Chronic obstructive pulmonary disease, unspecified: Secondary | ICD-10-CM | POA: Diagnosis not present

## 2018-01-23 DIAGNOSIS — E119 Type 2 diabetes mellitus without complications: Secondary | ICD-10-CM | POA: Diagnosis not present

## 2018-01-23 DIAGNOSIS — I509 Heart failure, unspecified: Secondary | ICD-10-CM | POA: Diagnosis not present

## 2018-02-03 DIAGNOSIS — Z6837 Body mass index (BMI) 37.0-37.9, adult: Secondary | ICD-10-CM | POA: Diagnosis not present

## 2018-02-03 DIAGNOSIS — R5383 Other fatigue: Secondary | ICD-10-CM | POA: Diagnosis not present

## 2018-02-03 DIAGNOSIS — I10 Essential (primary) hypertension: Secondary | ICD-10-CM | POA: Diagnosis not present

## 2018-02-03 DIAGNOSIS — M25511 Pain in right shoulder: Secondary | ICD-10-CM | POA: Diagnosis not present

## 2018-02-03 DIAGNOSIS — Z7189 Other specified counseling: Secondary | ICD-10-CM | POA: Diagnosis not present

## 2018-02-03 DIAGNOSIS — R69 Illness, unspecified: Secondary | ICD-10-CM | POA: Diagnosis not present

## 2018-02-03 DIAGNOSIS — Z0000 Encounter for general adult medical examination without abnormal findings: Secondary | ICD-10-CM | POA: Diagnosis not present

## 2018-02-03 DIAGNOSIS — Z299 Encounter for prophylactic measures, unspecified: Secondary | ICD-10-CM | POA: Diagnosis not present

## 2018-02-03 DIAGNOSIS — Z1339 Encounter for screening examination for other mental health and behavioral disorders: Secondary | ICD-10-CM | POA: Diagnosis not present

## 2018-02-03 DIAGNOSIS — Z1331 Encounter for screening for depression: Secondary | ICD-10-CM | POA: Diagnosis not present

## 2018-02-03 DIAGNOSIS — E7800 Pure hypercholesterolemia, unspecified: Secondary | ICD-10-CM | POA: Diagnosis not present

## 2018-02-03 DIAGNOSIS — Z79899 Other long term (current) drug therapy: Secondary | ICD-10-CM | POA: Diagnosis not present

## 2018-02-12 ENCOUNTER — Other Ambulatory Visit: Payer: Self-pay | Admitting: Internal Medicine

## 2018-02-13 NOTE — Telephone Encounter (Signed)
Pt is a 77 yr old female who saw Dr. Rayann Heman on 12/16/17. Weight at visit was 97.5Kg. SCr on 10/21/17 was 0.96.  Will refill Eliquis 5mg  BID.

## 2018-02-20 DIAGNOSIS — H1045 Other chronic allergic conjunctivitis: Secondary | ICD-10-CM | POA: Diagnosis not present

## 2018-02-20 DIAGNOSIS — Z961 Presence of intraocular lens: Secondary | ICD-10-CM | POA: Diagnosis not present

## 2018-02-20 DIAGNOSIS — I509 Heart failure, unspecified: Secondary | ICD-10-CM | POA: Diagnosis not present

## 2018-02-20 DIAGNOSIS — E119 Type 2 diabetes mellitus without complications: Secondary | ICD-10-CM | POA: Diagnosis not present

## 2018-02-20 DIAGNOSIS — H26492 Other secondary cataract, left eye: Secondary | ICD-10-CM | POA: Diagnosis not present

## 2018-02-20 DIAGNOSIS — J449 Chronic obstructive pulmonary disease, unspecified: Secondary | ICD-10-CM | POA: Diagnosis not present

## 2018-03-20 ENCOUNTER — Telehealth: Payer: Self-pay | Admitting: Cardiology

## 2018-03-20 ENCOUNTER — Ambulatory Visit (INDEPENDENT_AMBULATORY_CARE_PROVIDER_SITE_OTHER): Payer: Medicare HMO | Admitting: *Deleted

## 2018-03-20 DIAGNOSIS — I495 Sick sinus syndrome: Secondary | ICD-10-CM

## 2018-03-20 DIAGNOSIS — E119 Type 2 diabetes mellitus without complications: Secondary | ICD-10-CM | POA: Diagnosis not present

## 2018-03-20 DIAGNOSIS — I509 Heart failure, unspecified: Secondary | ICD-10-CM | POA: Diagnosis not present

## 2018-03-20 DIAGNOSIS — J449 Chronic obstructive pulmonary disease, unspecified: Secondary | ICD-10-CM | POA: Diagnosis not present

## 2018-03-20 NOTE — Telephone Encounter (Signed)
Spoke with pt and reminded pt of remote transmission that is due today. Pt verbalized understanding.   

## 2018-03-21 NOTE — Progress Notes (Signed)
Remote pacemaker transmission.   

## 2018-04-12 DIAGNOSIS — R69 Illness, unspecified: Secondary | ICD-10-CM | POA: Diagnosis not present

## 2018-04-12 LAB — CUP PACEART REMOTE DEVICE CHECK
Battery Remaining Longevity: 46 mo
Battery Voltage: 2.98 V
Brady Statistic RA Percent Paced: 94.77 %
Brady Statistic RV Percent Paced: 99.27 %
Date Time Interrogation Session: 20190916162626
Implantable Lead Location: 753859
Implantable Lead Model: 3830
Implantable Pulse Generator Implant Date: 20170829
Lead Channel Impedance Value: 456 Ohm
Lead Channel Pacing Threshold Amplitude: 0.75 V
Lead Channel Pacing Threshold Pulse Width: 0.4 ms
Lead Channel Sensing Intrinsic Amplitude: 1.75 mV
Lead Channel Sensing Intrinsic Amplitude: 4.75 mV
Lead Channel Setting Pacing Amplitude: 4 V
Lead Channel Setting Sensing Sensitivity: 0.9 mV
MDC IDC LEAD IMPLANT DT: 20170829
MDC IDC LEAD IMPLANT DT: 20170829
MDC IDC LEAD LOCATION: 753860
MDC IDC MSMT LEADCHNL RA IMPEDANCE VALUE: 323 Ohm
MDC IDC MSMT LEADCHNL RA IMPEDANCE VALUE: 437 Ohm
MDC IDC MSMT LEADCHNL RA SENSING INTR AMPL: 1.75 mV
MDC IDC MSMT LEADCHNL RV IMPEDANCE VALUE: 266 Ohm
MDC IDC MSMT LEADCHNL RV PACING THRESHOLD AMPLITUDE: 2.5 V
MDC IDC MSMT LEADCHNL RV PACING THRESHOLD PULSEWIDTH: 0.4 ms
MDC IDC MSMT LEADCHNL RV SENSING INTR AMPL: 4.75 mV
MDC IDC SET LEADCHNL RA PACING AMPLITUDE: 2 V
MDC IDC SET LEADCHNL RV PACING PULSEWIDTH: 1 ms
MDC IDC STAT BRADY AP VP PERCENT: 95.93 %
MDC IDC STAT BRADY AP VS PERCENT: 0.04 %
MDC IDC STAT BRADY AS VP PERCENT: 3.76 %
MDC IDC STAT BRADY AS VS PERCENT: 0.27 %

## 2018-04-13 ENCOUNTER — Other Ambulatory Visit (INDEPENDENT_AMBULATORY_CARE_PROVIDER_SITE_OTHER): Payer: Self-pay | Admitting: Internal Medicine

## 2018-04-18 DIAGNOSIS — Z1231 Encounter for screening mammogram for malignant neoplasm of breast: Secondary | ICD-10-CM | POA: Diagnosis not present

## 2018-04-27 DIAGNOSIS — I5030 Unspecified diastolic (congestive) heart failure: Secondary | ICD-10-CM | POA: Diagnosis not present

## 2018-04-27 DIAGNOSIS — R1084 Generalized abdominal pain: Secondary | ICD-10-CM | POA: Diagnosis not present

## 2018-04-27 DIAGNOSIS — Z6838 Body mass index (BMI) 38.0-38.9, adult: Secondary | ICD-10-CM | POA: Diagnosis not present

## 2018-04-27 DIAGNOSIS — Z299 Encounter for prophylactic measures, unspecified: Secondary | ICD-10-CM | POA: Diagnosis not present

## 2018-04-27 DIAGNOSIS — I10 Essential (primary) hypertension: Secondary | ICD-10-CM | POA: Diagnosis not present

## 2018-04-27 DIAGNOSIS — E1165 Type 2 diabetes mellitus with hyperglycemia: Secondary | ICD-10-CM | POA: Diagnosis not present

## 2018-05-10 DIAGNOSIS — R69 Illness, unspecified: Secondary | ICD-10-CM | POA: Diagnosis not present

## 2018-05-16 DIAGNOSIS — E119 Type 2 diabetes mellitus without complications: Secondary | ICD-10-CM | POA: Diagnosis not present

## 2018-05-16 DIAGNOSIS — J449 Chronic obstructive pulmonary disease, unspecified: Secondary | ICD-10-CM | POA: Diagnosis not present

## 2018-05-16 DIAGNOSIS — I509 Heart failure, unspecified: Secondary | ICD-10-CM | POA: Diagnosis not present

## 2018-05-26 NOTE — Progress Notes (Signed)
Cardiology Office Note  Date: 05/29/2018   ID: Miranda Sexton, DOB 06-May-1941, MRN [id number]  PCP: Glenda Chroman, MD  Primary Cardiologist: Rozann Lesches, MD   Chief Complaint  Patient presents with  . Atrial Fibrillation    History of Present Illness: Miranda Sexton is a 77 y.o. female last seen in March.  She is here today for a routine visit.  She reports occasional palpitations and some dyspnea on exertion, has been doing a lot to get ready for the holidays this week.  Patient underwent a right hemicolectomy and April after finding of dysplastic polyp.  Final pathology was negative for invasive carcinoma.  Hemoglobin was 9.2 back in April.  He does not report any obvious change in stools.  She sees Dr. Rayann Heman in the device clinic, Medtronic dual-chamber pacemaker in place.  Last interrogation showed 3.4% AT/AF burden.  I reviewed her current cardiac medications which are outlined below.  Past Medical History:  Diagnosis Date  . Anxiety   . Asthma   . Carotid artery occlusion   . Chronic bronchitis ([place])   . Chronic diastolic heart failure ([place])   . CKD (chronic kidney disease) stage 2, GFR 60-89 ml/min   . COPD (chronic obstructive pulmonary disease) ([place])   . Coronary atherosclerosis of native coronary artery    Nonobstructive 08/2008  . Daily headache   . DJD (degenerative joint disease)   . Essential hypertension   . GERD (gastroesophageal reflux disease)   . Hyperlipidemia   . Obesity   . On home oxygen therapy   . PAD (peripheral artery disease) (HCC)    Moderate right renal artery stenosis 08/2008  . Paroxysmal atrial fibrillation (HCC)   . Sick sinus syndrome (HCC)    Medtronic dual-chamber his bundle pacemaker - Dr. Rayann Heman  . Type 2 diabetes mellitus ([place])     Past Surgical History:  Procedure Laterality Date  . BIOPSY  08/03/2017   Procedure: BIOPSY;  Surgeon: [name], MD;  [facility];  Service: Endoscopy;;  gastric   .  CARDIAC CATHETERIZATION  2014  . CATARACT EXTRACTION W/ INTRAOCULAR LENS  IMPLANT, BILATERAL Bilateral 2008  . COLONOSCOPY N/A 08/03/2017   Procedure: COLONOSCOPY;  Surgeon: [name], MD;  [facility];  Service: Endoscopy;  Laterality: N/A;  . ENDARTERECTOMY Right 12/08/2012   Procedure: ENDARTERECTOMY CAROTID;  Surgeon: [name], MD;  Location: [facility];  Service: Vascular;  Laterality: Right;  . EP IMPLANTABLE DEVICE N/A 03/02/2016   Procedure: Pacemaker Implant;  Surgeon: [name], MD;  Location: [place] [facility];  Service: Cardiovascular;  Laterality: N/A;  . ESOPHAGOGASTRODUODENOSCOPY N/A 08/03/2017   Procedure: ESOPHAGOGASTRODUODENOSCOPY (EGD);  Surgeon: [name], MD;  [facility];  Service: Endoscopy;  Laterality: N/A;  . INSERT / REPLACE / REMOVE PACEMAKER  03/02/2016  . PARTIAL COLECTOMY N/A 10/19/2017   Procedure: PARTIAL COLECTOMY;  Surgeon: [name], MD;  [facility];  Service: General;  Laterality: N/A;  . POLYPECTOMY  08/03/2017   Procedure: POLYPECTOMY;  Surgeon: [name], MD;  [facility];  Service: Endoscopy;;  colon    Current Outpatient Medications  Medication Sig Dispense Refill  . albuterol (PROAIR HFA) 108 (90 Base) MCG/ACT inhaler Inhale 2 puffs into the lungs every 6 (six) hours as needed for wheezing or shortness of breath.     Marland Kitchen atorvastatin (LIPITOR) 40 MG tablet Take 40 mg by mouth daily.    Marland Kitchen  benazepril (LOTENSIN) 40 MG tablet Take 40 mg by mouth daily.    Marland Kitchen dicyclomine (BENTYL) 10 MG capsule Take 10 mg by mouth 4 (four) times daily -  before meals and at bedtime.    Marland Kitchen ELIQUIS 5 MG TABS tablet TAKE 1 TABLET BY MOUTH TWICE A DAY 60 tablet 9  . flecainide (TAMBOCOR) 50 MG tablet Take 1 tablet (50 mg total) by mouth 2 (two) times daily. (Patient taking differently: Take 50 mg by mouth daily. ) 180 tablet 3  . furosemide (LASIX) 40 MG tablet take 1 tablet by mouth once daily (Patient taking  differently: TAKE 1 TABLET (40 MG) BY MOUTH DAILY IN THE MORNING, MAY TAKE AN ADDITIONAL DOSE IN THE AFTERNOON IF NEEDED FOR FLUID RETENTION.) 15 tablet 0  . GAVILYTE-G 236 g solution Take 4,000 mLs by mouth once.  0  . glipiZIDE (GLUCOTROL XL) 10 MG 24 hr tablet Take 10 mg by mouth 2 (two) times daily.     . insulin aspart protamine- aspart (NOVOLOG 70/30) (70-30) 100 UNIT/ML injection Inject 44-56 Units into the skin See admin instructions. Inject 56 units in the morning & 44 units in the evening.    . meclizine (ANTIVERT) 25 MG tablet Take 12.5 mg by mouth 2 (two) times daily as needed for dizziness.    . metFORMIN (GLUCOPHAGE) 500 MG tablet Take 500 mg by mouth 2 (two) times daily.     . Multiple Vitamin (MULTIVITAMIN WITH MINERALS) TABS tablet Take 1 tablet by mouth daily. Centrum Silver    . olopatadine (PATANOL) 0.1 % ophthalmic solution Place 1 drop into both eyes 2 (two) times daily.     . pantoprazole (PROTONIX) 40 MG tablet TAKE 1 TABLET BY MOUTH TWICE DAILY BEFORE MEALS 60 tablet 5  . potassium chloride SA (K-DUR,KLOR-CON) 20 MEQ tablet Take 20 mEq by mouth daily.    . traMADol (ULTRAM) 50 MG tablet Take 1 tablet (50 mg total) by mouth every 6 (six) hours as needed. 25 tablet 0  . venlafaxine XR (EFFEXOR-XR) 150 MG 24 hr capsule Take 150 mg by mouth daily.    . metoprolol succinate (TOPROL XL) 25 MG 24 hr tablet Take 0.5 tablets (12.5 mg total) by mouth daily. 45 tablet 2   No current facility-administered medications for this visit.    Allergies:  Codeine and Penicillins   Social History: The patient  reports that she has never smoked. She has never used smokeless tobacco. She reports that she does not drink alcohol or use drugs.   ROS:  Please see the history of present illness. Otherwise, complete review of systems is positive for none.  All other systems are reviewed and negative.   Physical Exam: VS:  BP (!) 156/68   Pulse 68   Ht 5\' 5"  (1.651 m)   Wt 232 lb (105.2 kg)    SpO2 96%   BMI 38.61 kg/m , BMI Body mass index is 38.61 kg/m.  Wt Readings from Last 3 Encounters:  05/29/18 232 lb (105.2 kg)  12/16/17 215 lb (97.5 kg)  11/03/17 213 lb (96.6 kg)    General: Patient appears comfortable at rest. HEENT: Conjunctiva and lids normal, oropharynx clear. Neck: Supple, no elevated JVP or carotid bruits, no thyromegaly. Lungs: Clear to auscultation, nonlabored breathing at rest. Cardiac: Regular rate and rhythm, no S3, soft systolic murmur. Abdomen: Soft, nontender, bowel sounds present. Extremities: No pitting edema, distal pulses 2+. Skin: Warm and dry. Musculoskeletal: No kyphosis. Neuropsychiatric: Alert and oriented x3,  affect grossly appropriate.  ECG: I personally reviewed the tracing from 09/05/2017 which showed dual-chamber pacing.  Recent Labwork: 10/14/2017: ALT 12; AST 18 10/21/2017: BUN 9; Creatinine, Ser 0.96; Hemoglobin 9.2; Magnesium 2.0; Platelets 307; Potassium 3.7; Sodium 137   Other Studies Reviewed Today:  Echocardiogram 03/02/2016: Study Conclusions  - Left ventricle: The cavity size was normal. Wall thickness was increased in a pattern of mild LVH. Systolic function was normal. The estimated ejection fraction was in the range of 55% to 60%. Wall motion was normal; there were no regional wall motion abnormalities. - Aortic valve: Valve area (VTI): 1.37 cm^2. Valve area (Vmax): 1.32 cm^2. Valve area (Vmean): 1.5 cm^2. - Mitral valve: There was mild regurgitation. - Right atrium: The atrium was mildly dilated.  Assessment and Plan:  1.  Paroxysmal atrial fibrillation.  Plan to continue flecainide along with Eliquis.  We will also place her on low-dose Toprol-XL 12.5 mg daily.  Check CBC and BMET in comparison to April.  2.  Essential hypertension, no changes made to present antihypertensive regimen.  She continues to follow with Dr. Woody Seller.  3.  Previously documented mild nonobstructive CAD, no active angina symptoms.   She is on statin therapy, not on aspirin given concurrent use of Eliquis.  4.  Sick sinus syndrome with Medtronic His bundle pacemaker in place, followed by Dr. Rayann Heman.  Current medicines were reviewed with the patient today.   Orders Placed This Encounter  Procedures  . Basic metabolic panel  . CBC    Disposition: Follow-up in 6 months.  Signed, Satira Sark, MD, [facility] 05/29/2018 10:53 AM    [facility], [place], [place] [address] Phone: [phone]; Fax: [phone]

## 2018-05-29 ENCOUNTER — Ambulatory Visit (INDEPENDENT_AMBULATORY_CARE_PROVIDER_SITE_OTHER): Payer: Medicare HMO | Admitting: Cardiology

## 2018-05-29 ENCOUNTER — Encounter: Payer: Self-pay | Admitting: Cardiology

## 2018-05-29 VITALS — BP 156/68 | HR 68 | Ht 65.0 in | Wt 232.0 lb

## 2018-05-29 DIAGNOSIS — I10 Essential (primary) hypertension: Secondary | ICD-10-CM | POA: Diagnosis not present

## 2018-05-29 DIAGNOSIS — I119 Hypertensive heart disease without heart failure: Secondary | ICD-10-CM | POA: Diagnosis not present

## 2018-05-29 DIAGNOSIS — I480 Paroxysmal atrial fibrillation: Secondary | ICD-10-CM | POA: Diagnosis not present

## 2018-05-29 DIAGNOSIS — I495 Sick sinus syndrome: Secondary | ICD-10-CM | POA: Diagnosis not present

## 2018-05-29 MED ORDER — METOPROLOL SUCCINATE ER 25 MG PO TB24: 12.5000 mg | ORAL_TABLET | Freq: Every day | ORAL | 2 refills | Status: DC

## 2018-05-29 NOTE — Patient Instructions (Addendum)
Medication Instructions:   Your physician has recommended you make the following change in your medication:   Start metoprolol succinate (toprol xl) 12.5 mg by mouth daily.  Continue all other medications the same.  Labwork:  Your physician recommends that you return for lab work in: TODAY to check your BMET & CBC.  Testing/Procedures:  NONE  Follow-Up:  Your physician recommends that you schedule a follow-up appointment in: 6 months. You will receive a reminder letter in the mail in about 4 months reminding you to call and schedule your appointment. If you don't receive this letter, please contact our office.  Any Other Special Instructions Will Be Listed Below (If Applicable).  If you need a refill on your cardiac medications before your next appointment, please call your pharmacy.

## 2018-05-30 ENCOUNTER — Other Ambulatory Visit (HOSPITAL_COMMUNITY)
Admission: RE | Admit: 2018-05-30 | Discharge: 2018-05-30 | Disposition: A | Discharge status: Home / Self Care | Payer: Medicare HMO | Source: Ambulatory Visit | Attending: Cardiology | Admitting: Cardiology

## 2018-05-30 DIAGNOSIS — I480 Paroxysmal atrial fibrillation: Secondary | ICD-10-CM | POA: Diagnosis not present

## 2018-05-30 LAB — BASIC METABOLIC PANEL
Anion gap: 9 (ref 5–15)
BUN: 21 mg/dL (ref 8–23)
CO2: 28 mmol/L (ref 22–32)
CREATININE: 1.16 mg/dL — AB (ref 0.44–1.00)
Calcium: 9 mg/dL (ref 8.9–10.3)
Chloride: 104 mmol/L (ref 98–111)
GFR calc Af Amer: 53 mL/min — ABNORMAL LOW (ref >60)
GFR, EST NON AFRICAN AMERICAN: 45 mL/min — AB (ref >60)
Glucose, Bld: 124 mg/dL — ABNORMAL HIGH (ref 70–99)
POTASSIUM: 3.5 mmol/L (ref 3.5–5.1)
SODIUM: 141 mmol/L (ref 135–145)

## 2018-05-30 LAB — CBC
HCT: 37 % (ref 36.0–46.0)
Hemoglobin: 11.2 g/dL — ABNORMAL LOW (ref 12.0–15.0)
MCH: 27.3 pg (ref 26.0–34.0)
MCHC: 30.3 g/dL (ref 30.0–36.0)
MCV: 90.2 fL (ref 80.0–100.0)
PLATELETS: 300 10*3/uL (ref 150–400)
RBC: 4.1 MIL/uL (ref 3.87–5.11)
RDW: 14.5 % (ref 11.5–15.5)
WBC: 5.3 10*3/uL (ref 4.0–10.5)
nRBC: 0 % (ref 0.0–0.2)

## 2018-05-31 ENCOUNTER — Telehealth: Payer: Self-pay | Admitting: *Deleted

## 2018-05-31 NOTE — Telephone Encounter (Signed)
Patient informed. Copy sent to PCP °

## 2018-05-31 NOTE — Telephone Encounter (Signed)
-----   Message from Satira Sark, MD sent at 05/30/2018 10:42 AM EST ----- Results reviewed.  Potassium normal, creatinine 1.16, generally stable and fluctuates mildly over time based on review of lab work.  No change in Eliquis dose. A copy of this test should be forwarded to Glenda Chroman, MD.

## 2018-05-31 NOTE — Telephone Encounter (Signed)
-----   Message from Satira Sark, MD sent at 05/30/2018 10:40 AM EST ----- Results reviewed.  Hemoglobin has increased to 11.2 from 9.2 since April.  Continue with current medication. A copy of this test should be forwarded to Glenda Chroman, MD.

## 2018-06-09 ENCOUNTER — Ambulatory Visit (INDEPENDENT_AMBULATORY_CARE_PROVIDER_SITE_OTHER): Payer: Medicare HMO | Admitting: Internal Medicine

## 2018-06-09 ENCOUNTER — Encounter: Payer: Self-pay | Admitting: Internal Medicine

## 2018-06-09 VITALS — BP 134/79 | HR 71 | Ht 65.0 in | Wt 231.2 lb

## 2018-06-09 DIAGNOSIS — I442 Atrioventricular block, complete: Secondary | ICD-10-CM

## 2018-06-09 DIAGNOSIS — I495 Sick sinus syndrome: Secondary | ICD-10-CM | POA: Diagnosis not present

## 2018-06-09 DIAGNOSIS — I4819 Other persistent atrial fibrillation: Secondary | ICD-10-CM

## 2018-06-09 DIAGNOSIS — I480 Paroxysmal atrial fibrillation: Secondary | ICD-10-CM

## 2018-06-09 MED ORDER — FLECAINIDE ACETATE 100 MG PO TABS: 100.0000 mg | ORAL_TABLET | Freq: Two times a day (BID) | ORAL | 6 refills | Status: DC

## 2018-06-09 NOTE — Patient Instructions (Signed)
Medication Instructions:   Increase Flecainide to 100mg  twice a day.  Continue all other medications.    Labwork: none  Testing/Procedures:  Your physician has recommended that you have a Cardioversion (DCCV). Electrical Cardioversion uses a jolt of electricity to your heart either through paddles or wired patches attached to your chest. This is a controlled, usually prescheduled, procedure. Defibrillation is done under light anesthesia in the hospital, and you usually go home the day of the procedure. This is done to get your heart back into a normal rhythm. You are not awake for the procedure. Please see the instruction sheet given to you today.  Office will call when scheduled.    Follow-Up: 2 months with Dr. Rayann Heman after Cardioversion   Any Other Special Instructions Will Be Listed Below (If Applicable). You have been referred to:  Afib Clinic - 2 weeks after cardioversion   If you need a refill on your cardiac medications before your next appointment, please call your pharmacy.

## 2018-06-09 NOTE — Progress Notes (Signed)
PCP: Glenda Chroman, MD Primary Cardiologist: Dr Domenic Polite Primary EP:  Dr Rayann Heman  Miranda Sexton is a 77 y.o. female who presents today for routine electrophysiology followup.  Since last being seen in our clinic, the patient reports doing very well.  She is persistently in afib.  She has symptoms of SOB and fatigue. Today, she denies symptoms of palpitations, chest pain,   lower extremity edema, dizziness, presyncope, or syncope.  The patient is otherwise without complaint today.   Past Medical History:  Diagnosis Date  . Anxiety   . Asthma   . Carotid artery occlusion   . Chronic bronchitis ([place])   . Chronic diastolic heart failure ([place])   . CKD (chronic kidney disease) stage 2, GFR 60-89 ml/min   . COPD (chronic obstructive pulmonary disease) ([place])   . Coronary atherosclerosis of native coronary artery    Nonobstructive 08/2008  . Daily headache   . DJD (degenerative joint disease)   . Essential hypertension   . GERD (gastroesophageal reflux disease)   . Hyperlipidemia   . Obesity   . On home oxygen therapy   . PAD (peripheral artery disease) (HCC)    Moderate right renal artery stenosis 08/2008  . Paroxysmal atrial fibrillation (HCC)   . Sick sinus syndrome (HCC)    Medtronic dual-chamber his bundle pacemaker - Dr. Rayann Heman  . Type 2 diabetes mellitus ([place])    Past Surgical History:  Procedure Laterality Date  . BIOPSY  08/03/2017   Procedure: BIOPSY;  Surgeon: [name], MD;  [facility];  Service: Endoscopy;;  gastric   . CARDIAC CATHETERIZATION  2014  . CATARACT EXTRACTION W/ INTRAOCULAR LENS  IMPLANT, BILATERAL Bilateral 2008  . COLONOSCOPY N/A 08/03/2017   Procedure: COLONOSCOPY;  Surgeon: [name], MD;  [facility];  Service: Endoscopy;  Laterality: N/A;  . ENDARTERECTOMY Right 12/08/2012   Procedure: ENDARTERECTOMY CAROTID;  Surgeon: [name], MD;  Location: [facility];  Service: Vascular;  Laterality: Right;  . EP IMPLANTABLE  DEVICE N/A 03/02/2016   Procedure: Pacemaker Implant;  Surgeon: [name], MD;  Location: [place];  Service: Cardiovascular;  Laterality: N/A;  . ESOPHAGOGASTRODUODENOSCOPY N/A 08/03/2017   Procedure: ESOPHAGOGASTRODUODENOSCOPY (EGD);  Surgeon: [name], MD;  [facility];  Service: Endoscopy;  Laterality: N/A;  . INSERT / REPLACE / REMOVE PACEMAKER  03/02/2016  . PARTIAL COLECTOMY N/A 10/19/2017   Procedure: PARTIAL COLECTOMY;  Surgeon: [name], MD;  [facility];  Service: General;  Laterality: N/A;  . POLYPECTOMY  08/03/2017   Procedure: POLYPECTOMY;  Surgeon: [name], MD;  [facility];  Service: Endoscopy;;  colon    ROS- all systems are reviewed and negative except as per HPI above  Current Outpatient Medications  Medication Sig Dispense Refill  . albuterol (PROAIR HFA) 108 (90 Base) MCG/ACT inhaler Inhale 2 puffs into the lungs every 6 (six) hours as needed for wheezing or shortness of breath.     Marland Kitchen atorvastatin (LIPITOR) 40 MG tablet Take 40 mg by mouth daily.    . benazepril (LOTENSIN) 40 MG tablet Take 40 mg by mouth daily.    Marland Kitchen dicyclomine (BENTYL) 10 MG capsule Take 10 mg by mouth 4 (four) times daily -  before meals and at bedtime.    Marland Kitchen ELIQUIS 5 MG TABS tablet TAKE 1 TABLET BY MOUTH TWICE A DAY 60 tablet 9  . flecainide (TAMBOCOR) 50 MG tablet  Take 1 tablet (50 mg total) by mouth 2 (two) times daily. (Patient taking differently: Take 50 mg by mouth daily. ) 180 tablet 3  . furosemide (LASIX) 40 MG tablet take 1 tablet by mouth once daily (Patient taking differently: TAKE 1 TABLET (40 MG) BY MOUTH DAILY IN THE MORNING, MAY TAKE AN ADDITIONAL DOSE IN THE AFTERNOON IF NEEDED FOR FLUID RETENTION.) 15 tablet 0  . GAVILYTE-G 236 g solution Take 4,000 mLs by mouth once.  0  . glipiZIDE (GLUCOTROL XL) 10 MG 24 hr tablet Take 10 mg by mouth 2 (two) times daily.     . insulin aspart protamine- aspart (NOVOLOG 70/30) (70-30) 100  UNIT/ML injection Inject 44-56 Units into the skin See admin instructions. Inject 56 units in the morning & 44 units in the evening.    . meclizine (ANTIVERT) 25 MG tablet Take 12.5 mg by mouth 2 (two) times daily as needed for dizziness.    . metFORMIN (GLUCOPHAGE) 500 MG tablet Take 500 mg by mouth 2 (two) times daily.     . metoprolol succinate (TOPROL XL) 25 MG 24 hr tablet Take 0.5 tablets (12.5 mg total) by mouth daily. 45 tablet 2  . Multiple Vitamin (MULTIVITAMIN WITH MINERALS) TABS tablet Take 1 tablet by mouth daily. Centrum Silver    . olopatadine (PATANOL) 0.1 % ophthalmic solution Place 1 drop into both eyes 2 (two) times daily.     . pantoprazole (PROTONIX) 40 MG tablet TAKE 1 TABLET BY MOUTH TWICE DAILY BEFORE MEALS 60 tablet 5  . potassium chloride SA (K-DUR,KLOR-CON) 20 MEQ tablet Take 20 mEq by mouth daily.    . traMADol (ULTRAM) 50 MG tablet Take 1 tablet (50 mg total) by mouth every 6 (six) hours as needed. 25 tablet 0  . venlafaxine XR (EFFEXOR-XR) 150 MG 24 hr capsule Take 150 mg by mouth daily.     No current facility-administered medications for this visit.     Physical Exam: Vitals:   06/09/18 0932  BP: 134/79  Pulse: 71  SpO2: 97%  Weight: 231 lb 3.2 oz (104.9 kg)  Height: 5\' 5"  (1.651 m)    GEN- The patient is well appearing, alert and oriented x 3 today.   Head- normocephalic, atraumatic Eyes-  Sclera clear, conjunctiva pink Ears- hearing intact Oropharynx- clear Lungs- Clear to ausculation bilaterally, normal work of breathing Chest- pacemaker pocket is well healed Heart- Regular rate and rhythm (paced) GI- soft, NT, ND, + BS Extremities- no clubbing, cyanosis, or edema  Pacemaker interrogation- reviewed in detail today,  See PACEART report  ekg tracing ordered today is personally reviewed and shows afib, his bundle paced  Assessment and Plan:  1. Symptomatic sick sinus with complete heart block Normal pacemaker function See Pace Art report No  changes today His bundle lead threshold previously 1.75V @1  msec. today's threshold is 2V @1  msec.  She is programmed 4V @1  msec.  Could consider reducing output on return if stable with increased flecainide.  2. Persistent afib She has been in afib for several weeks She is symptomatic Increase flecainide to 100mg  BID She has chads2vasc score of 6.  She is on eliquis Will arrange cardioversion at next available time.  Risks of procedure discussed with patient who wishes to proceed. Follow-up in AF clinic 2 weeks post cardioversion Of note, ekg with His bundle pacing is regular and narrow.  It is difficult to determine AF or not by surface ekg.  Clearly in AF by device interrogation today.  3. HTN Stable No change required today  [facility] Follow-up in AF clinic in 3 weeks I will see in 2 months    [name] MD, [facility] 06/09/2018 10:16 AM

## 2018-06-12 ENCOUNTER — Telehealth (HOSPITAL_COMMUNITY): Payer: Self-pay | Admitting: *Deleted

## 2018-06-12 NOTE — Telephone Encounter (Signed)
I cld pt to schedule 3 week follow up per referral from Dr. Rayann Heman.   Pt stated that she would need to call us back to schedule when she has her daughters input

## 2018-06-16 ENCOUNTER — Telehealth: Payer: Self-pay | Admitting: Internal Medicine

## 2018-06-16 NOTE — Telephone Encounter (Signed)
Informed patient we are working on getting this scheduled, trying for the week of Christmas if this is okay with her.  Anytime for patient is good.

## 2018-06-16 NOTE — Telephone Encounter (Signed)
Returning Gayle's call  

## 2018-06-17 ENCOUNTER — Encounter (HOSPITAL_COMMUNITY): Payer: Self-pay | Admitting: Emergency Medicine

## 2018-06-17 ENCOUNTER — Other Ambulatory Visit: Payer: Self-pay

## 2018-06-17 ENCOUNTER — Emergency Department (HOSPITAL_COMMUNITY): Payer: Medicare HMO

## 2018-06-17 ENCOUNTER — Inpatient Hospital Stay (HOSPITAL_COMMUNITY)
Admission: EM | Admit: 2018-06-17 | Discharge: 2018-06-21 | DRG: 287 | Disposition: A | Discharge status: Home / Self Care | Payer: Medicare HMO | Attending: Internal Medicine | Admitting: Internal Medicine

## 2018-06-17 DIAGNOSIS — R079 Chest pain, unspecified: Secondary | ICD-10-CM | POA: Diagnosis not present

## 2018-06-17 DIAGNOSIS — Z880 Allergy status to penicillin: Secondary | ICD-10-CM

## 2018-06-17 DIAGNOSIS — E1151 Type 2 diabetes mellitus with diabetic peripheral angiopathy without gangrene: Secondary | ICD-10-CM | POA: Diagnosis present

## 2018-06-17 DIAGNOSIS — E11649 Type 2 diabetes mellitus with hypoglycemia without coma: Secondary | ICD-10-CM | POA: Diagnosis not present

## 2018-06-17 DIAGNOSIS — E669 Obesity, unspecified: Secondary | ICD-10-CM | POA: Diagnosis present

## 2018-06-17 DIAGNOSIS — Z86010 Personal history of colonic polyps: Secondary | ICD-10-CM

## 2018-06-17 DIAGNOSIS — I10 Essential (primary) hypertension: Secondary | ICD-10-CM | POA: Diagnosis present

## 2018-06-17 DIAGNOSIS — Z79899 Other long term (current) drug therapy: Secondary | ICD-10-CM

## 2018-06-17 DIAGNOSIS — E1122 Type 2 diabetes mellitus with diabetic chronic kidney disease: Secondary | ICD-10-CM | POA: Diagnosis present

## 2018-06-17 DIAGNOSIS — I25110 Atherosclerotic heart disease of native coronary artery with unstable angina pectoris: Principal | ICD-10-CM | POA: Diagnosis present

## 2018-06-17 DIAGNOSIS — Z9981 Dependence on supplemental oxygen: Secondary | ICD-10-CM

## 2018-06-17 DIAGNOSIS — J4540 Moderate persistent asthma, uncomplicated: Secondary | ICD-10-CM | POA: Diagnosis present

## 2018-06-17 DIAGNOSIS — I495 Sick sinus syndrome: Secondary | ICD-10-CM | POA: Diagnosis present

## 2018-06-17 DIAGNOSIS — Z961 Presence of intraocular lens: Secondary | ICD-10-CM | POA: Diagnosis present

## 2018-06-17 DIAGNOSIS — Z885 Allergy status to narcotic agent status: Secondary | ICD-10-CM

## 2018-06-17 DIAGNOSIS — K219 Gastro-esophageal reflux disease without esophagitis: Secondary | ICD-10-CM | POA: Diagnosis present

## 2018-06-17 DIAGNOSIS — I200 Unstable angina: Secondary | ICD-10-CM | POA: Clinically undetermined

## 2018-06-17 DIAGNOSIS — I4819 Other persistent atrial fibrillation: Secondary | ICD-10-CM | POA: Diagnosis present

## 2018-06-17 DIAGNOSIS — Z7901 Long term (current) use of anticoagulants: Secondary | ICD-10-CM

## 2018-06-17 DIAGNOSIS — J449 Chronic obstructive pulmonary disease, unspecified: Secondary | ICD-10-CM | POA: Diagnosis present

## 2018-06-17 DIAGNOSIS — Z9841 Cataract extraction status, right eye: Secondary | ICD-10-CM

## 2018-06-17 DIAGNOSIS — Z833 Family history of diabetes mellitus: Secondary | ICD-10-CM

## 2018-06-17 DIAGNOSIS — N182 Chronic kidney disease, stage 2 (mild): Secondary | ICD-10-CM | POA: Diagnosis present

## 2018-06-17 DIAGNOSIS — I130 Hypertensive heart and chronic kidney disease with heart failure and stage 1 through stage 4 chronic kidney disease, or unspecified chronic kidney disease: Secondary | ICD-10-CM | POA: Diagnosis present

## 2018-06-17 DIAGNOSIS — Z8249 Family history of ischemic heart disease and other diseases of the circulatory system: Secondary | ICD-10-CM

## 2018-06-17 DIAGNOSIS — I5032 Chronic diastolic (congestive) heart failure: Secondary | ICD-10-CM | POA: Diagnosis present

## 2018-06-17 DIAGNOSIS — Z6837 Body mass index (BMI) 37.0-37.9, adult: Secondary | ICD-10-CM

## 2018-06-17 DIAGNOSIS — E876 Hypokalemia: Secondary | ICD-10-CM | POA: Diagnosis present

## 2018-06-17 DIAGNOSIS — R0609 Other forms of dyspnea: Secondary | ICD-10-CM

## 2018-06-17 DIAGNOSIS — Z794 Long term (current) use of insulin: Secondary | ICD-10-CM

## 2018-06-17 DIAGNOSIS — Z9842 Cataract extraction status, left eye: Secondary | ICD-10-CM

## 2018-06-17 DIAGNOSIS — E785 Hyperlipidemia, unspecified: Secondary | ICD-10-CM | POA: Diagnosis present

## 2018-06-17 LAB — TROPONIN I
Troponin I: 0.07 ng/mL (ref <0.03)
Troponin I: 0.06 ng/mL (ref <0.03)
Troponin I: 0.04 ng/mL (ref <0.03)

## 2018-06-17 LAB — CBC
HCT: 34.4 % — ABNORMAL LOW (ref 36.0–46.0)
Hemoglobin: 10.4 g/dL — ABNORMAL LOW (ref 12.0–15.0)
MCH: 26.7 pg (ref 26.0–34.0)
MCHC: 30.2 g/dL (ref 30.0–36.0)
MCV: 88.4 fL (ref 80.0–100.0)
Platelets: 275 10*3/uL (ref 150–400)
RBC: 3.89 MIL/uL (ref 3.87–5.11)
RDW: 14.8 % (ref 11.5–15.5)
WBC: 5.1 10*3/uL (ref 4.0–10.5)
nRBC: 0 % (ref 0.0–0.2)

## 2018-06-17 LAB — APTT: aPTT: 38 seconds — ABNORMAL HIGH (ref 24–36)

## 2018-06-17 LAB — BRAIN NATRIURETIC PEPTIDE: B Natriuretic Peptide: 402 pg/mL — ABNORMAL HIGH (ref 0.0–100.0)

## 2018-06-17 LAB — BASIC METABOLIC PANEL
Anion gap: 8 (ref 5–15)
BUN: 19 mg/dL (ref 8–23)
CO2: 27 mmol/L (ref 22–32)
Calcium: 8.7 mg/dL — ABNORMAL LOW (ref 8.9–10.3)
Chloride: 102 mmol/L (ref 98–111)
Creatinine, Ser: 1.18 mg/dL — ABNORMAL HIGH (ref 0.44–1.00)
GFR calc Af Amer: 52 mL/min — ABNORMAL LOW (ref >60)
GFR calc non Af Amer: 44 mL/min — ABNORMAL LOW (ref >60)
GLUCOSE: 205 mg/dL — AB (ref 70–99)
Potassium: 3.4 mmol/L — ABNORMAL LOW (ref 3.5–5.1)
Sodium: 137 mmol/L (ref 135–145)

## 2018-06-17 LAB — MAGNESIUM: Magnesium: 1.8 mg/dL (ref 1.7–2.4)

## 2018-06-17 LAB — I-STAT TROPONIN, ED: Troponin i, poc: 0.03 ng/mL (ref 0.00–0.08)

## 2018-06-17 LAB — HEPARIN LEVEL (UNFRACTIONATED)

## 2018-06-17 MED ORDER — HEPARIN (PORCINE) 25000 UT/250ML-% IV SOLN
900.0000 [IU]/h | INTRAVENOUS | Status: DC
  Administered 2018-06-17: 1200 [IU]/h via INTRAVENOUS
  Administered 2018-06-18 – 2018-06-19 (×2): 900 [IU]/h via INTRAVENOUS
  Filled 2018-06-17 (×3): qty 250

## 2018-06-17 MED ORDER — POLYETHYLENE GLYCOL 3350 17 G PO PACK: 17.0000 g | PACK | Freq: Every day | ORAL | Status: DC | PRN

## 2018-06-17 MED ORDER — ASPIRIN 81 MG PO CHEW
324.0000 mg | CHEWABLE_TABLET | Freq: Once | ORAL | Status: AC
  Administered 2018-06-17: 324 mg via ORAL
  Filled 2018-06-17: qty 4

## 2018-06-17 MED ORDER — VENLAFAXINE HCL ER 150 MG PO CP24
150.0000 mg | ORAL_CAPSULE | Freq: Every day | ORAL | Status: DC
  Administered 2018-06-18 – 2018-06-21 (×4): 150 mg via ORAL
  Filled 2018-06-17 (×2): qty 2
  Filled 2018-06-17: qty 1
  Filled 2018-06-17: qty 2
  Filled 2018-06-17: qty 1

## 2018-06-17 MED ORDER — FLECAINIDE ACETATE 100 MG PO TABS
100.0000 mg | ORAL_TABLET | Freq: Two times a day (BID) | ORAL | Status: DC
  Administered 2018-06-17 – 2018-06-21 (×8): 100 mg via ORAL
  Filled 2018-06-17 (×2): qty 2
  Filled 2018-06-17: qty 1
  Filled 2018-06-17: qty 2
  Filled 2018-06-17 (×2): qty 1
  Filled 2018-06-17: qty 2
  Filled 2018-06-17 (×2): qty 1

## 2018-06-17 MED ORDER — ONDANSETRON HCL 4 MG/2ML IJ SOLN
4.0000 mg | INTRAMUSCULAR | Status: DC | PRN
  Administered 2018-06-17: 4 mg via INTRAVENOUS
  Filled 2018-06-17: qty 2

## 2018-06-17 MED ORDER — HEPARIN BOLUS VIA INFUSION
4000.0000 [IU] | Freq: Once | INTRAVENOUS | Status: AC
  Administered 2018-06-17: 4000 [IU] via INTRAVENOUS
  Filled 2018-06-17: qty 4000

## 2018-06-17 MED ORDER — INSULIN ASPART 100 UNIT/ML ~~LOC~~ SOLN
0.0000 [IU] | Freq: Three times a day (TID) | SUBCUTANEOUS | Status: DC
  Administered 2018-06-18 – 2018-06-19 (×2): 2 [IU] via SUBCUTANEOUS

## 2018-06-17 MED ORDER — ALBUTEROL SULFATE (2.5 MG/3ML) 0.083% IN NEBU: 2.5000 mg | INHALATION_SOLUTION | Freq: Four times a day (QID) | RESPIRATORY_TRACT | Status: DC | PRN

## 2018-06-17 MED ORDER — ONDANSETRON HCL 4 MG/2ML IJ SOLN: 4.0000 mg | Freq: Four times a day (QID) | INTRAMUSCULAR | Status: DC | PRN

## 2018-06-17 MED ORDER — ALBUTEROL SULFATE (2.5 MG/3ML) 0.083% IN NEBU: 2.5000 mg | INHALATION_SOLUTION | Freq: Once | RESPIRATORY_TRACT | Status: DC

## 2018-06-17 MED ORDER — FUROSEMIDE 40 MG PO TABS
40.0000 mg | ORAL_TABLET | Freq: Every day | ORAL | Status: DC
  Administered 2018-06-18 – 2018-06-20 (×3): 40 mg via ORAL
  Filled 2018-06-17 (×4): qty 1

## 2018-06-17 MED ORDER — ATORVASTATIN CALCIUM 40 MG PO TABS
40.0000 mg | ORAL_TABLET | Freq: Every day | ORAL | Status: DC
  Administered 2018-06-18 – 2018-06-21 (×4): 40 mg via ORAL
  Filled 2018-06-17 (×4): qty 1

## 2018-06-17 MED ORDER — PANTOPRAZOLE SODIUM 40 MG PO TBEC
40.0000 mg | DELAYED_RELEASE_TABLET | Freq: Two times a day (BID) | ORAL | Status: DC
  Administered 2018-06-18 – 2018-06-21 (×8): 40 mg via ORAL
  Filled 2018-06-17 (×8): qty 1

## 2018-06-17 MED ORDER — ACETAMINOPHEN 325 MG PO TABS: 650.0000 mg | ORAL_TABLET | Freq: Four times a day (QID) | ORAL | Status: DC | PRN

## 2018-06-17 MED ORDER — POTASSIUM CHLORIDE CRYS ER 20 MEQ PO TBCR
20.0000 meq | EXTENDED_RELEASE_TABLET | Freq: Every day | ORAL | Status: DC
  Administered 2018-06-18 – 2018-06-21 (×4): 20 meq via ORAL
  Filled 2018-06-17 (×4): qty 1

## 2018-06-17 MED ORDER — POTASSIUM CHLORIDE CRYS ER 20 MEQ PO TBCR
40.0000 meq | EXTENDED_RELEASE_TABLET | Freq: Once | ORAL | Status: AC
  Administered 2018-06-17: 40 meq via ORAL
  Filled 2018-06-17: qty 2

## 2018-06-17 MED ORDER — NITROGLYCERIN 0.4 MG SL SUBL
0.4000 mg | SUBLINGUAL_TABLET | SUBLINGUAL | Status: DC | PRN
  Filled 2018-06-17: qty 1

## 2018-06-17 MED ORDER — INSULIN ASPART PROT & ASPART (70-30 MIX) 100 UNIT/ML ~~LOC~~ SUSP
20.0000 [IU] | Freq: Two times a day (BID) | SUBCUTANEOUS | Status: DC
  Administered 2018-06-18 (×2): 20 [IU] via SUBCUTANEOUS
  Filled 2018-06-17: qty 10

## 2018-06-17 MED ORDER — METOPROLOL SUCCINATE ER 25 MG PO TB24
12.5000 mg | ORAL_TABLET | Freq: Every day | ORAL | Status: DC
  Administered 2018-06-18 – 2018-06-20 (×3): 12.5 mg via ORAL
  Filled 2018-06-17 (×4): qty 1

## 2018-06-17 MED ORDER — BENAZEPRIL HCL 10 MG PO TABS
40.0000 mg | ORAL_TABLET | Freq: Every day | ORAL | Status: DC
  Administered 2018-06-18 – 2018-06-20 (×3): 40 mg via ORAL
  Filled 2018-06-17 (×4): qty 4

## 2018-06-17 MED ORDER — ACETAMINOPHEN 650 MG RE SUPP: 650.0000 mg | Freq: Four times a day (QID) | RECTAL | Status: DC | PRN

## 2018-06-17 MED ORDER — ONDANSETRON HCL 4 MG PO TABS: 4.0000 mg | ORAL_TABLET | Freq: Four times a day (QID) | ORAL | Status: DC | PRN

## 2018-06-17 NOTE — ED Notes (Signed)
Date and time results received: 06/17/18 3:14 PM  (use smartphrase ".now" to insert current time)  Test: troponin  Critical Value: 0.07  Name of Provider Notified: Dr.  Thurnell Garbe   Orders Received? Or Actions Taken?:

## 2018-06-17 NOTE — Progress Notes (Signed)
ANTICOAGULATION CONSULT NOTE - Initial Consult  Pharmacy Consult for heparin Indication: ACS/STEMI  Allergies  Allergen Reactions  . Codeine Nausea And Vomiting  . Penicillins Rash and Other (See Comments)    Has patient had a PCN reaction causing immediate rash, facial/tongue/throat swelling, SOB or lightheadedness with hypotension: Yes Has patient had a PCN reaction causing severe rash involving mucus membranes or skin necrosis: No Has patient had a PCN reaction that required hospitalization No Has patient had a PCN reaction occurring within the last 10 years: No If all of the above answers are "NO", then may proceed with Cephalosporin use.     Patient Measurements: Height: 5\' 5"  (165.1 cm) Weight: 230 lb (104.3 kg) IBW/kg (Calculated) : 57 Heparin Dosing Weight: 81 kg  Vital Signs: Temp: 98.2 F (36.8 C) (12/14 1410) BP: 131/68 (12/14 1530) Pulse Rate: 59 (12/14 1500)  Labs: Recent Labs    06/17/18 1431  HGB 10.4*  HCT 34.4*  PLT 275  CREATININE 1.18*  TROPONINI 0.07*    Estimated Creatinine Clearance: 47.8 mL/min (A) (by C-G formula based on SCr of 1.18 mg/dL (H)).   Medical History: Past Medical History:  Diagnosis Date  . Anxiety   . Asthma   . Carotid artery occlusion   . Chronic bronchitis ([place])   . Chronic diastolic heart failure ([place])   . CKD (chronic kidney disease) stage 2, GFR 60-89 ml/min   . COPD (chronic obstructive pulmonary disease) ([place])   . Coronary atherosclerosis of native coronary artery    Nonobstructive 08/2008  . Daily headache   . DJD (degenerative joint disease)   . Essential hypertension   . GERD (gastroesophageal reflux disease)   . Hyperlipidemia   . Obesity   . On home oxygen therapy   . PAD (peripheral artery disease) (HCC)    Moderate right renal artery stenosis 08/2008  . Paroxysmal atrial fibrillation (HCC)   . Sick sinus syndrome (HCC)    Medtronic dual-chamber his bundle pacemaker - Dr. Rayann Heman  . Type 2 diabetes  mellitus (HCC)     Medications:  (Not in a hospital admission)   Assessment: Pharmacy consulted to dose heparin in patient with ACS/STEMI.  Patient's troponin on admission is 0.07.  Patient is on eliquis prior to admission with last dose taken at 1300 on 12/14.  Therefore, will wait until next apixaban dose is due at 2200 to start heparin and monitor levels based on aPTT until correlates with heparin levels.  Goal of Therapy:  Heparin level 0.3-0.7 units/ml aPTT 66-102 sec seconds Monitor platelets by anticoagulation protocol: Yes   Plan:  Give 4000 units bolus x 1 Start heparin infusion at 1200 units/hr Check anti-Xa level in 8 hours and daily while on heparin Continue to monitor H&H and platelets  Ramond Craver 06/17/2018,3:52 PM

## 2018-06-17 NOTE — ED Provider Notes (Signed)
[facility] EMERGENCY DEPARTMENT Provider Note   CSN: [id number] Arrival date & time: 06/17/18  1400     History   Chief Complaint Chief Complaint  Patient presents with  . Chest Pain    HPI Miranda Sexton is a 77 y.o. female.  HPI  Pt was seen at 1320. Per pt, c/o gradual onset and persistence of waxing and waning left sided chest "pain" for the past 3 days. Has been associated with SOB, nausea and increasing pedal edema. Describes the CP as "heaviness," with radiation into her bilat shoulders/arms. SOB worsens on exertion and laying flat. CP worsens on exertion and improves with resting. Pt states she has home O2 for prn use and she has been wearing it to treat her symptoms. Pt has not taken any medications for her symptoms. Denies palpitations, no cough, no abd pain, no vomiting/diarrhea, no fevers.    Past Medical History:  Diagnosis Date  . Anxiety   . Asthma   . Carotid artery occlusion   . Chronic bronchitis ([place])   . Chronic diastolic heart failure ([place])   . CKD (chronic kidney disease) stage 2, GFR 60-89 ml/min   . COPD (chronic obstructive pulmonary disease) ([place])   . Coronary atherosclerosis of native coronary artery    Nonobstructive 08/2008  . Daily headache   . DJD (degenerative joint disease)   . Essential hypertension   . GERD (gastroesophageal reflux disease)   . Hyperlipidemia   . Obesity   . On home oxygen therapy   . PAD (peripheral artery disease) (HCC)    Moderate right renal artery stenosis 08/2008  . Paroxysmal atrial fibrillation (HCC)   . Sick sinus syndrome (HCC)    Medtronic dual-chamber his bundle pacemaker - Dr. Rayann Heman  . Type 2 diabetes mellitus [facility])     Patient Active Problem List   Diagnosis Date Noted  . S/P partial colectomy 10/19/2017  . Dysplastic colon polyp   . Absolute anemia 06/23/2017  . Cardiac device in situ   . Sick sinus syndrome ([place]) 03/02/2016  . Secondary pulmonary hypertension 12/20/2013  . Dysphagia  03/14/2013  . Occlusion and stenosis of carotid artery without mention of cerebral infarction 12/05/2012  . Exertional dyspnea 11/09/2012  . Essential hypertension, benign 10/26/2012  . Chronic diastolic heart failure ([place]) 10/26/2012  . Mixed hyperlipidemia 09/10/2008  . CORONARY ATHEROSCLEROSIS NATIVE CORONARY ARTERY 09/10/2008    Past Surgical History:  Procedure Laterality Date  . BIOPSY  08/03/2017   Procedure: BIOPSY;  Surgeon: [name], MD;  Location: [facility];  Service: Endoscopy;;  gastric   . CARDIAC CATHETERIZATION  2014  . CATARACT EXTRACTION W/ INTRAOCULAR LENS  IMPLANT, BILATERAL Bilateral 2008  . COLONOSCOPY N/A 08/03/2017   Procedure: COLONOSCOPY;  Surgeon: [name], MD;  Location: [facility];  Service: Endoscopy;  Laterality: N/A;  . ENDARTERECTOMY Right 12/08/2012   Procedure: ENDARTERECTOMY CAROTID;  Surgeon: [name], MD;  Location: [place];  Service: Vascular;  Laterality: Right;  . EP IMPLANTABLE DEVICE N/A 03/02/2016   Procedure: Pacemaker Implant;  Surgeon: [name], MD;  Location: [place] CV LAB;  Service: Cardiovascular;  Laterality: N/A;  . ESOPHAGOGASTRODUODENOSCOPY N/A 08/03/2017   Procedure: ESOPHAGOGASTRODUODENOSCOPY (EGD);  Surgeon: [name], MD;  [facility];  Service: Endoscopy;  Laterality: N/A;  . INSERT / REPLACE / REMOVE PACEMAKER  03/02/2016  . PARTIAL COLECTOMY N/A 10/19/2017   Procedure: PARTIAL COLECTOMY;  Surgeon: [name], MD;  Location: [facility]  ORS;  Service: General;  Laterality: N/A;  . POLYPECTOMY  08/03/2017   Procedure: POLYPECTOMY;  Surgeon: [name], MD;  [facility];  Service: Endoscopy;;  colon     OB History   No obstetric history on file.      Home Medications    Prior to Admission medications   Medication Sig Start Date End Date Taking? Authorizing Provider  acetaminophen (TYLENOL) 325 MG tablet Take 325 mg by mouth every 6 (six) hours as needed for mild  pain or headache.   Yes [provider]  albuterol (PROAIR HFA) 108 (90 Base) MCG/ACT inhaler Inhale 2 puffs into the lungs every 6 (six) hours as needed for wheezing or shortness of breath.    Yes [provider]  atorvastatin (LIPITOR) 40 MG tablet Take 40 mg by mouth daily.   Yes [provider]  benazepril (LOTENSIN) 40 MG tablet Take 40 mg by mouth daily.   Yes [provider]  dicyclomine (BENTYL) 10 MG capsule Take 10 mg by mouth 4 (four) times daily -  before meals and at bedtime.   Yes [provider]  ELIQUIS 5 MG TABS tablet TAKE 1 TABLET BY MOUTH TWICE A DAY 02/13/18  Yes Allred, Jeneen Rinks, MD  flecainide (TAMBOCOR) 100 MG tablet Take 1 tablet (100 mg total) by mouth 2 (two) times daily. 06/09/18  Yes Allred, Jeneen Rinks, MD  furosemide (LASIX) 40 MG tablet take 1 tablet by mouth once daily Patient taking differently: TAKE 1 TABLET (40 MG) BY MOUTH DAILY IN THE MORNING, MAY TAKE AN ADDITIONAL DOSE IN THE AFTERNOON IF NEEDED FOR FLUID RETENTION. 10/20/15  Yes Satira Sark, MD  glipiZIDE (GLUCOTROL XL) 10 MG 24 hr tablet Take 10 mg by mouth 2 (two) times daily.    Yes [provider]  insulin aspart protamine- aspart (NOVOLOG 70/30) (70-30) 100 UNIT/ML injection Inject 44-56 Units into the skin See admin instructions. Inject 56 units in the morning & 44 units in the evening.   Yes [provider]  meclizine (ANTIVERT) 25 MG tablet Take 12.5 mg by mouth 2 (two) times daily as needed for dizziness.   Yes [provider]  metFORMIN (GLUCOPHAGE) 500 MG tablet Take 500 mg by mouth 2 (two) times daily.    Yes [provider]  metoprolol succinate (TOPROL XL) 25 MG 24 hr tablet Take 0.5 tablets (12.5 mg total) by mouth daily. 05/29/18  Yes Satira Sark, MD  Multiple Vitamin (MULTIVITAMIN WITH MINERALS) TABS tablet Take 1 tablet by mouth daily. Centrum Silver   Yes [provider]  olopatadine (PATANOL) 0.1 %  ophthalmic solution Place 1 drop into both eyes 2 (two) times daily.  02/25/16  Yes [provider]  pantoprazole (PROTONIX) 40 MG tablet TAKE 1 TABLET BY MOUTH TWICE DAILY BEFORE MEALS 04/13/18  Yes Setzer, Terri L, NP  potassium chloride SA (K-DUR,KLOR-CON) 20 MEQ tablet Take 20 mEq by mouth daily.   Yes [provider]  traMADol (ULTRAM) 50 MG tablet Take 1 tablet (50 mg total) by mouth every 6 (six) hours as needed. 10/22/17  Yes [name], MD  venlafaxine XR (EFFEXOR-XR) 150 MG 24 hr capsule Take 150 mg by mouth daily.   Yes [provider]  GAVILYTE-G 236 g solution Take 4,000 mLs by mouth once. 09/01/17   [provider]    Family History Family History  Problem Relation Age of Onset  . Cancer Mother  Stomach  . Deep vein thrombosis Mother   . Diabetes Mother   . Hypertension Mother   . Heart disease Mother   . Hypertension Father   . Diabetes Sister   . Hyperlipidemia Sister   . Hypertension Sister   . Heart disease Sister   . Diabetes Brother   . Hyperlipidemia Brother   . Hypertension Brother     Social History Social History   Tobacco Use  . Smoking status: Never Smoker  . Smokeless tobacco: Never Used  Substance Use Topics  . Alcohol use: No    Alcohol/week: 0.0 standard drinks  . Drug use: No     Allergies   Codeine and Penicillins   Review of Systems Review of Systems ROS: Statement: All systems negative except as marked or noted in the HPI; Constitutional: Negative for fever and chills. ; ; Eyes: Negative for eye pain, redness and discharge. ; ; ENMT: Negative for ear pain, hoarseness, nasal congestion, sinus pressure and sore throat. ; ; Cardiovascular: Negative for palpitations, diaphoresis, +CP, dyspnea and peripheral edema. ; ; Respiratory: Negative for cough, wheezing and stridor. ; ; Gastrointestinal: +nausea. Negative for vomiting, diarrhea, abdominal pain, blood in stool, hematemesis, jaundice and rectal  bleeding. . ; ; Genitourinary: Negative for dysuria, flank pain and hematuria. ; ; Musculoskeletal: Negative for back pain and neck pain. Negative for swelling and trauma.; ; Skin: Negative for pruritus, rash, abrasions, blisters, bruising and skin lesion.; ; Neuro: Negative for headache, lightheadedness and neck stiffness. Negative for weakness, altered level of consciousness, altered mental status, extremity weakness, paresthesias, involuntary movement, seizure and syncope.       Physical Exam Updated Vital Signs BP 131/68   Pulse (!) 59   Temp 98.2 F (36.8 C)   Resp (!) 22   Ht 5\' 5"  (1.651 m)   Wt 104.3 kg   SpO2 97%   BMI 38.27 kg/m    Patient Vitals for the past 24 hrs:  BP Temp Pulse Resp SpO2 Height Weight  06/17/18 1530 131/68 - - (!) 22 - - -  06/17/18 1500 (!) 114/59 - (!) 59 (!) 25 97 % - -  06/17/18 1430 (!) 118/57 - (!) 59 (!) 28 98 % - -  06/17/18 1411 - - - - 98 % - -  06/17/18 1410 (!) 136/55 98.2 F (36.8 C) 63 (!) 26 (!) 88 % - -  06/17/18 1408 - - - - - 5\' 5"  (1.651 m) 104.3 kg     Physical Exam 1325: Physical examination:  Nursing notes reviewed; Vital signs and O2 SAT reviewed;  Constitutional: Well developed, Well nourished, Well hydrated, In no acute distress; Head:  Normocephalic, atraumatic; Eyes: EOMI, PERRL, No scleral icterus; ENMT: Mouth and pharynx normal, Mucous membranes moist; Neck: Supple, Full range of motion, No lymphadenopathy; Cardiovascular: Regular rate and rhythm, No gallop; Respiratory: Breath sounds clear & equal bilaterally, No wheezes.  Speaking full sentences with ease, Normal respiratory effort/excursion; Chest: Nontender, Movement normal; Abdomen: Soft, Nontender, Nondistended, Normal bowel sounds; Genitourinary: No CVA tenderness; Extremities: Peripheral pulses normal, No tenderness, +1 pedal edema bilat. No calf edema or asymmetry.; Neuro: AA&Ox3, Major CN grossly intact.  Speech clear. No gross focal motor or sensory deficits in  extremities.; Skin: Color normal, Warm, Dry.   ED Treatments / Results  Labs (all labs ordered are listed, but only abnormal results are displayed)   EKG EKG Interpretation  Date/Time:  Saturday June 17 2018 14:08:05 EST Ventricular Rate:  68 PR  Interval:    QRS Duration: 129 QT Interval:  510 QTC Calculation: 543 R Axis:   16 Text Interpretation:  Atrial-ventricular dual-paced rhythm No further analysis attempted due to paced rhythm Baseline wander When compared with ECG of 04/03/2016 No significant change was found Confirmed by Francine Graven [phone]) on 06/17/2018 2:31:12 PM   Radiology   Procedures Procedures (including critical care time)  Medications Ordered in ED Medications  nitroGLYCERIN (NITROSTAT) SL tablet 0.4 mg (has no administration in time range)  ondansetron (ZOFRAN) injection 4 mg (4 mg Intravenous Given 06/17/18 1505)  aspirin chewable tablet 324 mg (324 mg Oral Given 06/17/18 1505)     Initial Impression / Assessment and Plan / ED Course  I have reviewed the triage vital signs and the nursing notes.  Pertinent labs & imaging results that were available during my care of the patient were reviewed by me and considered in my medical decision making (see chart for details).  MDM Reviewed: previous chart, nursing note and vitals Reviewed previous: labs and ECG Interpretation: labs, ECG and x-ray Total time providing critical care: 30-74 minutes. This excludes time spent performing separately reportable procedures and services. Consults: cardiology and admitting MD   CRITICAL CARE Performed by: Francine Graven Total critical care time: 35 minutes Critical care time was exclusive of separately billable procedures and treating other patients. Critical care was necessary to treat or prevent imminent or life-threatening deterioration. Critical care was time spent personally by me on the following activities: development of treatment plan with  patient and/or surrogate as well as nursing, discussions with consultants, evaluation of patient's response to treatment, examination of patient, obtaining history from patient or surrogate, ordering and performing treatments and interventions, ordering and review of laboratory studies, ordering and review of radiographic studies, pulse oximetry and re-evaluation of patient's condition.   Results for orders placed or performed during the hospital encounter of 35/59/74  Basic metabolic panel  Result Value Ref Range   Sodium 137 135 - 145 mmol/L   Potassium 3.4 (L) 3.5 - 5.1 mmol/L   Chloride 102 98 - 111 mmol/L   CO2 27 22 - 32 mmol/L   Glucose, Bld 205 (H) 70 - 99 mg/dL   BUN 19 8 - 23 mg/dL   Creatinine, Ser 1.18 (H) 0.44 - 1.00 mg/dL   Calcium 8.7 (L) 8.9 - 10.3 mg/dL   GFR calc non Af Amer 44 (L) >60 mL/min   GFR calc Af Amer 52 (L) >60 mL/min   Anion gap 8 5 - 15  CBC  Result Value Ref Range   WBC 5.1 4.0 - 10.5 K/uL   RBC 3.89 3.87 - 5.11 MIL/uL   Hemoglobin 10.4 (L) 12.0 - 15.0 g/dL   HCT 34.4 (L) 36.0 - 46.0 %   MCV 88.4 80.0 - 100.0 fL   MCH 26.7 26.0 - 34.0 pg   MCHC 30.2 30.0 - 36.0 g/dL   RDW 14.8 11.5 - 15.5 %   Platelets 275 150 - 400 K/uL   nRBC 0.0 0.0 - 0.2 %  Troponin I - ONCE - STAT  Result Value Ref Range   Troponin I 0.07 (HH) <0.03 ng/mL  Brain natriuretic peptide  Result Value Ref Range   B Natriuretic Peptide 402.0 (H) 0.0 - 100.0 pg/mL  I-stat troponin, ED  Result Value Ref Range   Troponin i, poc 0.03 0.00 - 0.08 ng/mL   Comment 3           Dg Chest 2 View  Result Date: 06/17/2018 CLINICAL DATA:  Chest pain for several days EXAM: CHEST - 2 VIEW COMPARISON:  December 31, 2017 FINDINGS: Stable mild cardiomegaly. The hila and mediastinum are stable. The pacemaker stable. Mild atelectasis in the bases. No suspicious infiltrates. No overt edema. IMPRESSION: No active cardiopulmonary disease. Electronically Signed   By: Dorise Bullion III M.D   On: 06/17/2018  14:44     1545:  Troponin (lab and I-stat) both drawn at same time; will draw 2nd troponin level now. ASA, SL ntg and zofran ordered with improvement of her symptoms. T/C returned from [facility] Dr. Acie Fredrickson, case discussed, including:  HPI, pertinent PM/SHx, VS/PE, dx testing, ED course and treatment:  Agrees pt needs admit to cycle enzymes, start IV heparin gtt now, start IV ntg gtt prn, considering no beds currently at [facility] with 15 pt hold (including those from OSH to come to [facility]), as pt is stable it may be most appropriate to keep pt at [facility] for now, transfer to [facility] (or other OSH) if condition warrants.   1605:  T/C returned from [facility] Dr. Denton Brick, case discussed, including:  HPI, pertinent PM/SHx, VS/PE, dx testing, ED course and treatment:  Agreeable to admit.     Final Clinical Impressions(s) / ED Diagnoses   Final diagnoses:  None    ED Discharge Orders    None       Francine Graven, DO 06/18/18 1932

## 2018-06-17 NOTE — ED Triage Notes (Addendum)
Patient c/o mid-sternal chest pain that radiates into arms and shoulders bilaterally as well as back. Pain started on Wednesday and is getting progressively worse. Per patient shortness of breath, nausea, vomiting, weakness, dizziness, and swelling in lower extremities. Patient has hx of a-fib.

## 2018-06-17 NOTE — ED Notes (Signed)
Report to floor

## 2018-06-17 NOTE — H&P (Signed)
History and Physical    Miranda Sexton ZOX:[id number] DOB: 07-07-1940 DOA: 06/17/2018  PCP: Glenda Chroman, MD   Patient coming from: Home  I have personally briefly reviewed patient's old medical records in [place]  Chief Complaint: Chest Pain  HPI: Miranda Sexton is a 77 y.o. female with medical history significant for sick sinus syndrome status post dual chamber pacer, accessible A. fib diastolic CHF, carotid artery disease, who presented to the ED with complaints of left-sided chest pain of 3 days duration, radiates to both arms, lasting 10 to 15 minutes, aggravated by exertion, relieved by rest.  Chest pain is associated with dizziness shortness of breath, nausea.  No vomiting.  She reports chronic mild loose stools 1 to twice a day which is unchanged.  She reports improved lower extremity swelling.  Reports twice daily compliance with Eliquis, and daily Lasix.  She is on 2 L O2 chronically at night.  Never smoked cigarettes. Denies IV drug use or cocaine use.  ED Course: O2 sats 88% on RA, Stable vitals. Trop 0.07, EKG- Dual paced rhythm. 2- view. No acute abnormality, no edema.  K3.4.  Hemoglobin 10.4.  Creatinine at baseline 1.18. BNP-elevated at 407, no prior to compare. EDP talked to cardiology on-call who recommended admission here for now, as 15 patients are currently holding, awaiting transfer.  Start Heparin drip nitro if needed.  The time of my evaluation patient has been chest pain-free for at least 3 hours.  Review of Systems: As per HPI all other systems reviewed and negative  Past Medical History:  Diagnosis Date  . Anxiety   . Asthma   . Carotid artery occlusion   . Chronic bronchitis ([place])   . Chronic diastolic heart failure ([place])   . CKD (chronic kidney disease) stage 2, GFR 60-89 ml/min   . COPD (chronic obstructive pulmonary disease) ([place])   . Coronary atherosclerosis of native coronary artery    Nonobstructive 08/2008  . Daily headache   . DJD  (degenerative joint disease)   . Essential hypertension   . GERD (gastroesophageal reflux disease)   . Hyperlipidemia   . Obesity   . On home oxygen therapy   . PAD (peripheral artery disease) (HCC)    Moderate right renal artery stenosis 08/2008  . Paroxysmal atrial fibrillation (HCC)   . Sick sinus syndrome (HCC)    Medtronic dual-chamber his bundle pacemaker - Dr. Rayann Heman  . Type 2 diabetes mellitus ([place])     Past Surgical History:  Procedure Laterality Date  . BIOPSY  08/03/2017   Procedure: BIOPSY;  Surgeon: [name], MD;  [facility];  Service: Endoscopy;;  gastric   . CARDIAC CATHETERIZATION  2014  . CATARACT EXTRACTION W/ INTRAOCULAR LENS  IMPLANT, BILATERAL Bilateral 2008  . COLONOSCOPY N/A 08/03/2017   Procedure: COLONOSCOPY;  Surgeon: [name], MD;  [facility];  Service: Endoscopy;  Laterality: N/A;  . ENDARTERECTOMY Right 12/08/2012   Procedure: ENDARTERECTOMY CAROTID;  Surgeon: [name], MD;  Location: [facility];  Service: Vascular;  Laterality: Right;  . EP IMPLANTABLE DEVICE N/A 03/02/2016   Procedure: Pacemaker Implant;  Surgeon: [name], MD;  Location: [facility];  Service: Cardiovascular;  Laterality: N/A;  . ESOPHAGOGASTRODUODENOSCOPY N/A 08/03/2017   Procedure: ESOPHAGOGASTRODUODENOSCOPY (EGD);  Surgeon: [name], MD;  [facility];  Service: Endoscopy;  Laterality: N/A;  . INSERT / REPLACE / REMOVE PACEMAKER  03/02/2016  . PARTIAL  COLECTOMY N/A 10/19/2017   Procedure: PARTIAL COLECTOMY;  Surgeon: [name], MD;  Location: [facility];  Service: General;  Laterality: N/A;  . POLYPECTOMY  08/03/2017   Procedure: POLYPECTOMY;  Surgeon: [name], MD;  [facility];  Service: Endoscopy;;  colon     reports that she has never smoked. She has never used smokeless tobacco. She reports that she does not drink alcohol or use drugs.  Allergies  Allergen Reactions  . Codeine Nausea And  Vomiting  . Penicillins Rash and Other (See Comments)    Has patient had a PCN reaction causing immediate rash, facial/tongue/throat swelling, SOB or lightheadedness with hypotension: Yes Has patient had a PCN reaction causing severe rash involving mucus membranes or skin necrosis: No Has patient had a PCN reaction that required hospitalization No Has patient had a PCN reaction occurring within the last 10 years: No If all of the above answers are "NO", then may proceed with Cephalosporin use.     Family History  Problem Relation Age of Onset  . Cancer Mother        Stomach  . Deep vein thrombosis Mother   . Diabetes Mother   . Hypertension Mother   . Heart disease Mother   . Hypertension Father   . Diabetes Sister   . Hyperlipidemia Sister   . Hypertension Sister   . Heart disease Sister   . Diabetes Brother   . Hyperlipidemia Brother   . Hypertension Brother     Prior to Admission medications   Medication Sig Start Date End Date Taking? Authorizing Provider  acetaminophen (TYLENOL) 325 MG tablet Take 325 mg by mouth every 6 (six) hours as needed for mild pain or headache.   Yes [provider]  albuterol (PROAIR HFA) 108 (90 Base) MCG/ACT inhaler Inhale 2 puffs into the lungs every 6 (six) hours as needed for wheezing or shortness of breath.    Yes [provider]  atorvastatin (LIPITOR) 40 MG tablet Take 40 mg by mouth daily.   Yes [provider]  benazepril (LOTENSIN) 40 MG tablet Take 40 mg by mouth daily.   Yes [provider]  dicyclomine (BENTYL) 10 MG capsule Take 10 mg by mouth 4 (four) times daily -  before meals and at bedtime.   Yes [provider]  ELIQUIS 5 MG TABS tablet TAKE 1 TABLET BY MOUTH TWICE A DAY 02/13/18  Yes Allred, Jeneen Rinks, MD  flecainide (TAMBOCOR) 100 MG tablet Take 1 tablet (100 mg total) by mouth 2 (two) times daily. 06/09/18  Yes Allred, Jeneen Rinks, MD  furosemide (LASIX) 40 MG tablet take 1 tablet by mouth  once daily Patient taking differently: TAKE 1 TABLET (40 MG) BY MOUTH DAILY IN THE MORNING, MAY TAKE AN ADDITIONAL DOSE IN THE AFTERNOON IF NEEDED FOR FLUID RETENTION. 10/20/15  Yes Satira Sark, MD  glipiZIDE (GLUCOTROL XL) 10 MG 24 hr tablet Take 10 mg by mouth 2 (two) times daily.    Yes [provider]  insulin aspart protamine- aspart (NOVOLOG 70/30) (70-30) 100 UNIT/ML injection Inject 44-56 Units into the skin See admin instructions. Inject 56 units in the morning & 44 units in the evening.   Yes [provider]  meclizine (ANTIVERT) 25 MG tablet Take 12.5 mg by mouth 2 (two) times daily as needed for dizziness.   Yes [provider]  metFORMIN (GLUCOPHAGE) 500 MG tablet Take 500 mg by mouth 2 (two) times daily.    Yes [provider]  metoprolol succinate (TOPROL XL) 25 MG 24 hr tablet Take 0.5 tablets (12.5 mg total) by mouth daily. 05/29/18  Yes Satira Sark, MD  Multiple Vitamin (MULTIVITAMIN WITH MINERALS) TABS tablet Take 1 tablet by mouth daily. Centrum Silver   Yes [provider]  olopatadine (PATANOL) 0.1 % ophthalmic solution Place 1 drop into both eyes 2 (two) times daily.  02/25/16  Yes [provider]  pantoprazole (PROTONIX) 40 MG tablet TAKE 1 TABLET BY MOUTH TWICE DAILY BEFORE MEALS 04/13/18  Yes Setzer, Terri L, NP  potassium chloride SA (K-DUR,KLOR-CON) 20 MEQ tablet Take 20 mEq by mouth daily.   Yes [provider]  traMADol (ULTRAM) 50 MG tablet Take 1 tablet (50 mg total) by mouth every 6 (six) hours as needed. 10/22/17  Yes [name], MD  venlafaxine XR (EFFEXOR-XR) 150 MG 24 hr capsule Take 150 mg by mouth daily.   Yes [provider]  GAVILYTE-G 236 g solution Take 4,000 mLs by mouth once. 09/01/17   [provider]    Physical Exam: Vitals:   06/17/18 1430 06/17/18 1500 06/17/18 1530 06/17/18 1600  BP: (!) 118/57 (!) 114/59 131/68 137/70  Pulse: (!) 59 (!) 59    Resp: (!)  28 (!) 25 (!) 22 (!) 21  Temp:      SpO2: 98% 97%    Weight:      Height:        Constitutional: NAD, calm, comfortable Vitals:   06/17/18 1430 06/17/18 1500 06/17/18 1530 06/17/18 1600  BP: (!) 118/57 (!) 114/59 131/68 137/70  Pulse: (!) 59 (!) 59    Resp: (!) 28 (!) 25 (!) 22 (!) 21  Temp:      SpO2: 98% 97%    Weight:      Height:       Eyes: PERRL, lids and conjunctivae normal ENMT: Mucous membranes are dry. Posterior pharynx clear of any exudate or lesions.  Neck: normal, supple, no masses, no thyromegaly Respiratory: very Faint expiratory wheezing diffuse, no crackles. Normal respiratory effort. No accessory muscle use.  Cardiovascular: Regular rate and rhythm, no murmurs / rubs / gallops.Trace bilat extremity edema. 2+ pedal pulses.  Abdomen: no tenderness, no masses palpated. No hepatosplenomegaly. Bowel sounds positive.  Musculoskeletal: no clubbing / cyanosis. No joint deformity upper and lower extremities. Good ROM, no contractures. Normal muscle tone.  Skin: no rashes, lesions, ulcers. No induration Neurologic: CN 2-12 grossly intact.  Strength 5/5 in all 4.  Psychiatric: Normal judgment and insight. Alert and oriented x 3. Normal mood.   Labs on Admission: I have personally reviewed following labs and imaging studies  CBC: Recent Labs  Lab 06/17/18 1431  WBC 5.1  HGB 10.4*  HCT 34.4*  MCV 88.4  PLT 443   Basic Metabolic Panel: Recent Labs  Lab 06/17/18 1431  NA 137  K 3.4*  CL 102  CO2 27  GLUCOSE 205*  BUN 19  CREATININE 1.18*  CALCIUM 8.7*   Cardiac Enzymes: Recent Labs  Lab 06/17/18 1431  TROPONINI 0.07*    Radiological Exams on Admission: Dg Chest 2 View  Result Date: 06/17/2018 CLINICAL DATA:  Chest pain for several days EXAM: CHEST - 2 VIEW COMPARISON:  December 31, 2017 FINDINGS: Stable mild cardiomegaly. The hila and mediastinum are stable. The pacemaker stable. Mild atelectasis in the bases. No suspicious infiltrates. No overt edema.  IMPRESSION: No active cardiopulmonary disease. Electronically Signed   By: Dorise Bullion III M.D  On: 06/17/2018 14:44    EKG: Independently reviewed.  Dual paced rhythm.  Assessment/Plan Principal Problem:   Chest pain Active Problems:   CORONARY ATHEROSCLEROSIS NATIVE CORONARY ARTERY   Essential hypertension, benign   Chronic diastolic heart failure (HCC)   Sick sinus syndrome (HCC)   Chest Pain - Typical. Trop X 1- 0.07.  EKG - dual paced rhythm. At this time chest pain-free.  History of carotid artery disease. EDP to cardiology on-call recommended admission here, as patient as patients are in holding-Heparin GTT and start nitro if needed -Heparin GTT. Patient took her Eliquis this morning heparin GTT will start in p.m. - ECHO - TRops x 3, Q3H - Cardiology consult here in A.m if patient is not transferred for elevated troponin, or change in clinical status - Aspirin 325 given - Cont metop, Statin, PRN nitro -Hold Eliquis, while on heparin - UDS - NPO midnight  Chronic diastolic CHF-BNP-elevated 407 no prior to compare.  Chest x-ray without edema.  Trace bilateral pitting pedal edema. On chronic 2 L O2 at night. -Continue home Lasix 40 daily  Sick sinus syndrome, paroxysmal A. Fib-currently dual paced rhythm on EKG.  Rates 50s to 11s. -Continue home flecainide, metoprolol -Hold home Eliquis while on heparin GTT  Hypokalemia-mild.  K3.4. -Replete, continue home K-Dur 12meq supplements -Check magnesium  HTN-stable -Continue home metoprolol, Lasix, benazepril  DM- glucose to 205. Hgba1c 10/2017- 7.7. -Resume home medications 7030 at reduced dose 20 twice daily - Hold metformin - SSI  Asthma- never smoker. - Albuterol nebs   Prolonged QTC- Dual chamber pacing, on flecainide and venlafaxine also.   DVT prophylaxis: Heparin Code Status: Full Family Communication: None at bedside  disposition Plan: Per rounding team Consults called: None Admission status: Obs,  tele   Bethena Roys MD [facility] Pager [phone] From 3PM-11PM.  Otherwise please contact night-coverage [email or web address] Password [facility]  06/17/2018, 4:58 PM

## 2018-06-18 ENCOUNTER — Observation Stay (HOSPITAL_BASED_OUTPATIENT_CLINIC_OR_DEPARTMENT_OTHER): Payer: Medicare HMO

## 2018-06-18 DIAGNOSIS — I5032 Chronic diastolic (congestive) heart failure: Secondary | ICD-10-CM | POA: Diagnosis not present

## 2018-06-18 DIAGNOSIS — R079 Chest pain, unspecified: Secondary | ICD-10-CM | POA: Diagnosis not present

## 2018-06-18 DIAGNOSIS — I10 Essential (primary) hypertension: Secondary | ICD-10-CM | POA: Diagnosis not present

## 2018-06-18 DIAGNOSIS — I25110 Atherosclerotic heart disease of native coronary artery with unstable angina pectoris: Secondary | ICD-10-CM | POA: Diagnosis not present

## 2018-06-18 DIAGNOSIS — I361 Nonrheumatic tricuspid (valve) insufficiency: Secondary | ICD-10-CM | POA: Diagnosis not present

## 2018-06-18 DIAGNOSIS — I495 Sick sinus syndrome: Secondary | ICD-10-CM

## 2018-06-18 LAB — ECHOCARDIOGRAM COMPLETE
HEIGHTINCHES: 65 in
Weight: 3680 oz

## 2018-06-18 LAB — BASIC METABOLIC PANEL
Anion gap: 7 (ref 5–15)
BUN: 19 mg/dL (ref 8–23)
CO2: 28 mmol/L (ref 22–32)
Calcium: 8.7 mg/dL — ABNORMAL LOW (ref 8.9–10.3)
Chloride: 105 mmol/L (ref 98–111)
Creatinine, Ser: 1.01 mg/dL — ABNORMAL HIGH (ref 0.44–1.00)
GFR calc Af Amer: >60 mL/min (ref >60)
GFR calc non Af Amer: 54 mL/min — ABNORMAL LOW (ref >60)
GLUCOSE: 90 mg/dL (ref 70–99)
Potassium: 4 mmol/L (ref 3.5–5.1)
Sodium: 140 mmol/L (ref 135–145)

## 2018-06-18 LAB — CBC
HEMATOCRIT: 33.6 % — AB (ref 36.0–46.0)
Hemoglobin: 10 g/dL — ABNORMAL LOW (ref 12.0–15.0)
MCH: 26.7 pg (ref 26.0–34.0)
MCHC: 29.8 g/dL — ABNORMAL LOW (ref 30.0–36.0)
MCV: 89.8 fL (ref 80.0–100.0)
Platelets: 281 10*3/uL (ref 150–400)
RBC: 3.74 MIL/uL — ABNORMAL LOW (ref 3.87–5.11)
RDW: 14.9 % (ref 11.5–15.5)
WBC: 6.3 10*3/uL (ref 4.0–10.5)
nRBC: 0 % (ref 0.0–0.2)

## 2018-06-18 LAB — APTT
aPTT: 181 seconds (ref 24–36)
aPTT: 82 seconds — ABNORMAL HIGH (ref 24–36)

## 2018-06-18 LAB — LIPID PANEL
Cholesterol: 97 mg/dL (ref 0–200)
HDL: 36 mg/dL — ABNORMAL LOW (ref >40)
LDL Cholesterol: 49 mg/dL (ref 0–99)
Total CHOL/HDL Ratio: 2.7 RATIO
Triglycerides: 62 mg/dL (ref <150)
VLDL: 12 mg/dL (ref 0–40)

## 2018-06-18 LAB — GLUCOSE, CAPILLARY: Glucose-Capillary: 87 mg/dL (ref 70–99)

## 2018-06-18 LAB — HEPARIN LEVEL (UNFRACTIONATED): Heparin Unfractionated: >2.2 IU/mL — ABNORMAL HIGH (ref 0.30–0.70)

## 2018-06-18 NOTE — Progress Notes (Signed)
  Echocardiogram 2D Echocardiogram has been performed.  Matilde Bash 06/18/2018, 10:46 AM

## 2018-06-18 NOTE — Progress Notes (Signed)
PTT  81  Contacted pharmacist at [facility] for any new heparin adjustments.  She will check

## 2018-06-18 NOTE — Progress Notes (Signed)
[facility] for heparin Indication: ACS/STEMI  Allergies  Allergen Reactions  . Codeine Nausea And Vomiting  . Penicillins Rash and Other (See Comments)    Has patient had a PCN reaction causing immediate rash, facial/tongue/throat swelling, SOB or lightheadedness with hypotension: Yes Has patient had a PCN reaction causing severe rash involving mucus membranes or skin necrosis: No Has patient had a PCN reaction that required hospitalization No Has patient had a PCN reaction occurring within the last 10 years: No If all of the above answers are "NO", then may proceed with Cephalosporin use.     Patient Measurements: Height: 5\' 5"  (165.1 cm) Weight: 230 lb (104.3 kg) IBW/kg (Calculated) : 57 Heparin Dosing Weight: 81 kg  Vital Signs: Temp: 98.2 F (36.8 C) (12/15 1431) BP: 127/63 (12/15 1431) Pulse Rate: 59 (12/15 1431)  Labs: Recent Labs    06/17/18 1431 06/17/18 1604 06/17/18 2157 06/18/18 0724 06/18/18 1647  HGB 10.4*  --   --  10.0*  --   HCT 34.4*  --   --  33.6*  --   PLT 275  --   --  281  --   APTT  --  38*  --  181* 82*  HEPARINUNFRC  --  >2.20*  --  >2.20*  --   CREATININE 1.18*  --   --  1.01*  --   TROPONINI 0.07* 0.06* 0.04*  --   --     Estimated Creatinine Clearance: 55.9 mL/min (A) (by C-G formula based on SCr of 1.01 mg/dL (H)).   Medical History: Past Medical History:  Diagnosis Date  . Anxiety   . Asthma   . Carotid artery occlusion   . Chronic bronchitis ([place])   . Chronic diastolic heart failure ([place])   . CKD (chronic kidney disease) stage 2, GFR 60-89 ml/min   . COPD (chronic obstructive pulmonary disease) ([place])   . Coronary atherosclerosis of native coronary artery    Nonobstructive 08/2008  . Daily headache   . DJD (degenerative joint disease)   . Essential hypertension   . GERD (gastroesophageal reflux disease)   . Hyperlipidemia   . Obesity   . On home oxygen therapy   . PAD (peripheral  artery disease) (HCC)    Moderate right renal artery stenosis 08/2008  . Paroxysmal atrial fibrillation (HCC)   . Sick sinus syndrome (HCC)    Medtronic dual-chamber his bundle pacemaker - Dr. Rayann Heman  . Type 2 diabetes mellitus (HCC)     Medications:  Medications Prior to Admission  Medication Sig Dispense Refill Last Dose  . acetaminophen (TYLENOL) 325 MG tablet Take 325 mg by mouth every 6 (six) hours as needed for mild pain or headache.   Past Week at Unknown time  . albuterol (PROAIR HFA) 108 (90 Base) MCG/ACT inhaler Inhale 2 puffs into the lungs every 6 (six) hours as needed for wheezing or shortness of breath.    Past Week at Unknown time  . atorvastatin (LIPITOR) 40 MG tablet Take 40 mg by mouth daily.   06/17/2018 at Unknown time  . benazepril (LOTENSIN) 40 MG tablet Take 40 mg by mouth daily.   06/17/2018 at Unknown time  . dicyclomine (BENTYL) 10 MG capsule Take 10 mg by mouth 4 (four) times daily -  before meals and at bedtime.   06/17/2018 at Unknown time  . ELIQUIS 5 MG TABS tablet TAKE 1 TABLET BY MOUTH TWICE A DAY 60 tablet 9 06/17/2018 at Unknown  time  . flecainide (TAMBOCOR) 100 MG tablet Take 1 tablet (100 mg total) by mouth 2 (two) times daily. 60 tablet 6 06/17/2018 at Unknown time  . furosemide (LASIX) 40 MG tablet take 1 tablet by mouth once daily (Patient taking differently: TAKE 1 TABLET (40 MG) BY MOUTH DAILY IN THE MORNING, MAY TAKE AN ADDITIONAL DOSE IN THE AFTERNOON IF NEEDED FOR FLUID RETENTION.) 15 tablet 0 06/17/2018 at Unknown time  . glipiZIDE (GLUCOTROL XL) 10 MG 24 hr tablet Take 10 mg by mouth 2 (two) times daily.    06/17/2018 at Unknown time  . insulin aspart protamine- aspart (NOVOLOG 70/30) (70-30) 100 UNIT/ML injection Inject 44-56 Units into the skin See admin instructions. Inject 56 units in the morning & 44 units in the evening.   06/17/2018 at Unknown time  . meclizine (ANTIVERT) 25 MG tablet Take 12.5 mg by mouth 2 (two) times daily as needed for  dizziness.   06/17/2018 at Unknown time  . metFORMIN (GLUCOPHAGE) 500 MG tablet Take 500 mg by mouth 2 (two) times daily.    06/17/2018 at Unknown time  . metoprolol succinate (TOPROL XL) 25 MG 24 hr tablet Take 0.5 tablets (12.5 mg total) by mouth daily. 45 tablet 2 06/17/2018 at 1300  . Multiple Vitamin (MULTIVITAMIN WITH MINERALS) TABS tablet Take 1 tablet by mouth daily. Centrum Silver   06/17/2018 at Unknown time  . olopatadine (PATANOL) 0.1 % ophthalmic solution Place 1 drop into both eyes 2 (two) times daily.    Past Week at Unknown time  . pantoprazole (PROTONIX) 40 MG tablet TAKE 1 TABLET BY MOUTH TWICE DAILY BEFORE MEALS 60 tablet 5 06/17/2018 at Unknown time  . potassium chloride SA (K-DUR,KLOR-CON) 20 MEQ tablet Take 20 mEq by mouth daily.   06/17/2018 at Unknown time  . traMADol (ULTRAM) 50 MG tablet Take 1 tablet (50 mg total) by mouth every 6 (six) hours as needed. 25 tablet 0 Past Month at Unknown time  . venlafaxine XR (EFFEXOR-XR) 150 MG 24 hr capsule Take 150 mg by mouth daily.   06/17/2018 at Unknown time  . GAVILYTE-G 236 g solution Take 4,000 mLs by mouth once.  0 Taking    Assessment: Pharmacy consulted to dose heparin in patient with ACS/STEMI.  Patient's troponin on admission is 0.07.  Patient is on eliquis prior to admission with last dose taken at 1300 on 12/14.  APTT is therapeutic. Heparin level still supratherapeutic due to apixaban . Monitor levels based on aPTT until correlates with heparin levels.  Goal of Therapy:  Heparin level 0.3-0.7 units/ml aPTT 66-102 sec seconds Monitor platelets by anticoagulation protocol: Yes   Plan:  Continue heparin infusion at 900 units/hr Check anti-Xa level and aPTT daily while on heparin Continue to monitor H&H and platelets.   Revonda Standard Amonie Wisser 06/18/2018,8:43 PM

## 2018-06-18 NOTE — Progress Notes (Signed)
Critical  PTT of 181 .  Contacted Dr. Wynetta Emery and pharmacy and stopped heparin. Pharmacy switched dose to 28ml/hr

## 2018-06-18 NOTE — Progress Notes (Signed)
PROGRESS NOTE  Miranda Sexton  DJM:[id number]  DOB: 06/20/1941  DOA: 06/17/2018 PCP: Glenda Chroman, MD   Brief Admission Hx: 77 y.o. female with medical history significant for sick sinus syndrome status post dual chamber pacer, accessible A. fib diastolic CHF, carotid artery disease, who presented to the ED with complaints of left-sided chest pain of 3 days duration, radiates to both arms, lasting 10 to 15 minutes, aggravated by exertion, relieved by rest.  Chest pain is associated with dizziness shortness of breath, nausea.  No vomiting.   MDM/Assessment & Plan:   1. Chest pain at rest-patient has a history of coronary artery disease.  Her initial troponin was 0.07 it is now trended down.  Her chest pain symptoms have resolved since being in the hospital.  She remains on a heparin infusion.  Her apixaban is being held.  She was continued on metoprolol, statin and nitroglycerin.  Cardiology is consulted to see the patient in the morning.  Continue on heparin infusion.  Continue supportive care.  I am going to make the patient n.p.o. after midnight. 2. Chronic diastolic CHF- her initial BNP was elevated.  Her chest x-ray was without pulmonary edema.  She is on Lasix 40 mg daily to control symptoms.  She is also on chronic oxygen nightly 2 L. 3. Sick sinus syndrome with paroxysmal atrial fibrillation-patient is currently dual paced.  She is followed by EP cardiology.  She is on flecainide and metoprolol which has been resumed and she is fully anticoagulated on an IV heparin infusion. 4. Hypokalemia- repleted at admission.  Check BMP this morning and follow.  Check magnesium. 5. Essential hypertension-has remained stable and following closely while on home medications. 6. Insulin-dependent type 2 diabetes mellitus-metformin is being held, patient on sliding scale insulin and resumed her insulin 70/30 at a reduced dose.  Monitor CBG 5 times daily. 7. Moderate persistent asthma- albuterol nebs  ordered as needed. 8. Prolonged QTC-patient has a dual chamber pacemaker and is on flecainide and followed by EP cardiology.  DVT prophylaxis: IV heparin infusion Code Status: Full Family Communication: Patient updated at bedside Disposition Plan: Home when medically stabilized   Consultants:  Inpatient cardiology heart care  Procedures:  N/A  Antimicrobials:  N/A  Subjective: Patient says she is not having chest pain or shortness of breath at this time.  She did have quite a bit of chest pain when she was admitted.  However she has no shortness of breath.  She would like to eat breakfast this morning.  Objective: Vitals:   06/17/18 1855 06/17/18 2058 06/17/18 2135 06/18/18 0627  BP: 120/60  (!) 105/59 (!) 98/57  Pulse: 64  (!) 59 62  Resp: 16  18 17   Temp: 97.9 F (36.6 C)  98.1 F (36.7 C) (!) 97.4 F (36.3 C)  TempSrc: Oral  Oral Oral  SpO2: 100% 98% 100% 100%  Weight:      Height:        Intake/Output Summary (Last 24 hours) at 06/18/2018 0908 Last data filed at 06/18/2018 2229 Gross per 24 hour  Intake 139.65 ml  Output -  Net 139.65 ml   Filed Weights   06/17/18 1408  Weight: 104.3 kg   REVIEW OF SYSTEMS  As per history otherwise all reviewed and reported negative  Exam:  General exam: Awake, alert, no distress, cooperative and pleasant Respiratory system: Bilateral breath sounds. No increased work of breathing. Cardiovascular system: Normal S1 & S2 heard. No JVD, murmurs, gallops, clicks  or pedal edema. Gastrointestinal system: Abdomen is nondistended, soft and nontender. Normal bowel sounds heard. Central nervous system: Alert and oriented. No focal neurological deficits. Extremities: Trace pretibial edema bilateral lower extremities.  2+ pulses in upper and lower extremities noted.  Data Reviewed: Basic Metabolic Panel: Recent Labs  Lab 06/17/18 1431 06/17/18 1620  NA 137  --   K 3.4*  --   CL 102  --   CO2 27  --   GLUCOSE 205*  --     BUN 19  --   CREATININE 1.18*  --   CALCIUM 8.7*  --   MG  --  1.8   Liver Function Tests: No results for input(s): AST, ALT, ALKPHOS, BILITOT, PROT, ALBUMIN in the last 168 hours. No results for input(s): LIPASE, AMYLASE in the last 168 hours. No results for input(s): AMMONIA in the last 168 hours. CBC: Recent Labs  Lab 06/17/18 1431 06/18/18 0724  WBC 5.1 6.3  HGB 10.4* 10.0*  HCT 34.4* 33.6*  MCV 88.4 89.8  PLT 275 281   Cardiac Enzymes: Recent Labs  Lab 06/17/18 1431 06/17/18 1604 06/17/18 2157  TROPONINI 0.07* 0.06* 0.04*   CBG (last 3)  Recent Labs    06/18/18 0759  GLUCAP 87   No results found for this or any previous visit (from the past 240 hour(s)).   Studies: Dg Chest 2 View  Result Date: 06/17/2018 CLINICAL DATA:  Chest pain for several days EXAM: CHEST - 2 VIEW COMPARISON:  December 31, 2017 FINDINGS: Stable mild cardiomegaly. The hila and mediastinum are stable. The pacemaker stable. Mild atelectasis in the bases. No suspicious infiltrates. No overt edema. IMPRESSION: No active cardiopulmonary disease. Electronically Signed   By: Dorise Bullion III M.D   On: 06/17/2018 14:44   Scheduled Meds: . albuterol  2.5 mg Nebulization Once  . atorvastatin  40 mg Oral Daily  . benazepril  40 mg Oral Daily  . flecainide  100 mg Oral BID  . furosemide  40 mg Oral Daily  . insulin aspart  0-15 Units Subcutaneous TID WC  . insulin aspart protamine- aspart  20 Units Subcutaneous BID WC  . metoprolol succinate  12.5 mg Oral Daily  . pantoprazole  40 mg Oral BID AC  . potassium chloride SA  20 mEq Oral Daily  . venlafaxine XR  150 mg Oral Daily   Continuous Infusions: . heparin 900 Units/hr (06/18/18 0849)    Principal Problem:   Chest pain Active Problems:   CORONARY ATHEROSCLEROSIS NATIVE CORONARY ARTERY   Essential hypertension, benign   Chronic diastolic heart failure (HCC)   Sick sinus syndrome ([place])  Time spent:   Irwin Brakeman, MD,  FAAFP [facility] Pager [phone]  If 7PM-7AM, please contact night-coverage [email or web address] Password [email or web address] 06/18/2018, 9:08 AM    LOS: 0 days

## 2018-06-18 NOTE — Progress Notes (Signed)
[place] for heparin Indication: ACS/STEMI  Allergies  Allergen Reactions  . Codeine Nausea And Vomiting  . Penicillins Rash and Other (See Comments)    Has patient had a PCN reaction causing immediate rash, facial/tongue/throat swelling, SOB or lightheadedness with hypotension: Yes Has patient had a PCN reaction causing severe rash involving mucus membranes or skin necrosis: No Has patient had a PCN reaction that required hospitalization No Has patient had a PCN reaction occurring within the last 10 years: No If all of the above answers are "NO", then may proceed with Cephalosporin use.     Patient Measurements: Height: 5\' 5"  (165.1 cm) Weight: 230 lb (104.3 kg) IBW/kg (Calculated) : 57 Heparin Dosing Weight: 81 kg  Vital Signs: Temp: 97.4 F (36.3 C) (12/15 0627) Temp Source: Oral (12/15 0627) BP: 98/57 (12/15 0627) Pulse Rate: 62 (12/15 0627)  Labs: Recent Labs    06/17/18 1431 06/17/18 1604 06/17/18 2157 06/18/18 0724  HGB 10.4*  --   --  10.0*  HCT 34.4*  --   --  33.6*  PLT 275  --   --  281  APTT  --  38*  --  181*  HEPARINUNFRC  --  >2.20*  --  >2.20*  CREATININE 1.18*  --   --   --   TROPONINI 0.07* 0.06* 0.04*  --     Estimated Creatinine Clearance: 47.8 mL/min (A) (by C-G formula based on SCr of 1.18 mg/dL (H)).   Medical History: Past Medical History:  Diagnosis Date  . Anxiety   . Asthma   . Carotid artery occlusion   . Chronic bronchitis ([place])   . Chronic diastolic heart failure ([place])   . CKD (chronic kidney disease) stage 2, GFR 60-89 ml/min   . COPD (chronic obstructive pulmonary disease) ([place])   . Coronary atherosclerosis of native coronary artery    Nonobstructive 08/2008  . Daily headache   . DJD (degenerative joint disease)   . Essential hypertension   . GERD (gastroesophageal reflux disease)   . Hyperlipidemia   . Obesity   . On home oxygen therapy   . PAD (peripheral artery disease) (HCC)     Moderate right renal artery stenosis 08/2008  . Paroxysmal atrial fibrillation (HCC)   . Sick sinus syndrome (HCC)    Medtronic dual-chamber his bundle pacemaker - Dr. Rayann Heman  . Type 2 diabetes mellitus (HCC)     Medications:  Medications Prior to Admission  Medication Sig Dispense Refill Last Dose  . acetaminophen (TYLENOL) 325 MG tablet Take 325 mg by mouth every 6 (six) hours as needed for mild pain or headache.   Past Week at Unknown time  . albuterol (PROAIR HFA) 108 (90 Base) MCG/ACT inhaler Inhale 2 puffs into the lungs every 6 (six) hours as needed for wheezing or shortness of breath.    Past Week at Unknown time  . atorvastatin (LIPITOR) 40 MG tablet Take 40 mg by mouth daily.   06/17/2018 at Unknown time  . benazepril (LOTENSIN) 40 MG tablet Take 40 mg by mouth daily.   06/17/2018 at Unknown time  . dicyclomine (BENTYL) 10 MG capsule Take 10 mg by mouth 4 (four) times daily -  before meals and at bedtime.   06/17/2018 at Unknown time  . ELIQUIS 5 MG TABS tablet TAKE 1 TABLET BY MOUTH TWICE A DAY 60 tablet 9 06/17/2018 at Unknown time  . flecainide (TAMBOCOR) 100 MG tablet Take 1 tablet (100 mg total)  by mouth 2 (two) times daily. 60 tablet 6 06/17/2018 at Unknown time  . furosemide (LASIX) 40 MG tablet take 1 tablet by mouth once daily (Patient taking differently: TAKE 1 TABLET (40 MG) BY MOUTH DAILY IN THE MORNING, MAY TAKE AN ADDITIONAL DOSE IN THE AFTERNOON IF NEEDED FOR FLUID RETENTION.) 15 tablet 0 06/17/2018 at Unknown time  . glipiZIDE (GLUCOTROL XL) 10 MG 24 hr tablet Take 10 mg by mouth 2 (two) times daily.    06/17/2018 at Unknown time  . insulin aspart protamine- aspart (NOVOLOG 70/30) (70-30) 100 UNIT/ML injection Inject 44-56 Units into the skin See admin instructions. Inject 56 units in the morning & 44 units in the evening.   06/17/2018 at Unknown time  . meclizine (ANTIVERT) 25 MG tablet Take 12.5 mg by mouth 2 (two) times daily as needed for dizziness.   06/17/2018  at Unknown time  . metFORMIN (GLUCOPHAGE) 500 MG tablet Take 500 mg by mouth 2 (two) times daily.    06/17/2018 at Unknown time  . metoprolol succinate (TOPROL XL) 25 MG 24 hr tablet Take 0.5 tablets (12.5 mg total) by mouth daily. 45 tablet 2 06/17/2018 at 1300  . Multiple Vitamin (MULTIVITAMIN WITH MINERALS) TABS tablet Take 1 tablet by mouth daily. Centrum Silver   06/17/2018 at Unknown time  . olopatadine (PATANOL) 0.1 % ophthalmic solution Place 1 drop into both eyes 2 (two) times daily.    Past Week at Unknown time  . pantoprazole (PROTONIX) 40 MG tablet TAKE 1 TABLET BY MOUTH TWICE DAILY BEFORE MEALS 60 tablet 5 06/17/2018 at Unknown time  . potassium chloride SA (K-DUR,KLOR-CON) 20 MEQ tablet Take 20 mEq by mouth daily.   06/17/2018 at Unknown time  . traMADol (ULTRAM) 50 MG tablet Take 1 tablet (50 mg total) by mouth every 6 (six) hours as needed. 25 tablet 0 Past Month at Unknown time  . venlafaxine XR (EFFEXOR-XR) 150 MG 24 hr capsule Take 150 mg by mouth daily.   06/17/2018 at Unknown time  . GAVILYTE-G 236 g solution Take 4,000 mLs by mouth once.  0 Taking    Assessment: Pharmacy consulted to dose heparin in patient with ACS/STEMI.  Patient's troponin on admission is 0.07.  Patient is on eliquis prior to admission with last dose taken at 1300 on 12/14.  Heparin level still supratherapeutic due to apixaban and aPTT elevated at 181. Monitor levels based on aPTT until correlates with heparin levels.  Goal of Therapy:  Heparin level 0.3-0.7 units/ml aPTT 66-102 sec seconds Monitor platelets by anticoagulation protocol: Yes   Plan:  Hold heparin for 90 minutes. Decrease heparin infusion to 900 units/hr Check anti-Xa level in 8 hours and daily while on heparin Continue to monitor H&H and platelets.   Revonda Standard Ionna Avis 06/18/2018,8:41 AM

## 2018-06-19 DIAGNOSIS — N182 Chronic kidney disease, stage 2 (mild): Secondary | ICD-10-CM | POA: Diagnosis present

## 2018-06-19 DIAGNOSIS — I5032 Chronic diastolic (congestive) heart failure: Secondary | ICD-10-CM | POA: Diagnosis not present

## 2018-06-19 DIAGNOSIS — J4540 Moderate persistent asthma, uncomplicated: Secondary | ICD-10-CM | POA: Diagnosis present

## 2018-06-19 DIAGNOSIS — E669 Obesity, unspecified: Secondary | ICD-10-CM | POA: Diagnosis not present

## 2018-06-19 DIAGNOSIS — E785 Hyperlipidemia, unspecified: Secondary | ICD-10-CM | POA: Diagnosis not present

## 2018-06-19 DIAGNOSIS — E876 Hypokalemia: Secondary | ICD-10-CM | POA: Diagnosis present

## 2018-06-19 DIAGNOSIS — Z7901 Long term (current) use of anticoagulants: Secondary | ICD-10-CM | POA: Diagnosis not present

## 2018-06-19 DIAGNOSIS — Z9841 Cataract extraction status, right eye: Secondary | ICD-10-CM | POA: Diagnosis not present

## 2018-06-19 DIAGNOSIS — E1122 Type 2 diabetes mellitus with diabetic chronic kidney disease: Secondary | ICD-10-CM | POA: Diagnosis present

## 2018-06-19 DIAGNOSIS — Z79899 Other long term (current) drug therapy: Secondary | ICD-10-CM | POA: Diagnosis not present

## 2018-06-19 DIAGNOSIS — I25110 Atherosclerotic heart disease of native coronary artery with unstable angina pectoris: Secondary | ICD-10-CM | POA: Diagnosis not present

## 2018-06-19 DIAGNOSIS — Z86010 Personal history of colonic polyps: Secondary | ICD-10-CM | POA: Diagnosis not present

## 2018-06-19 DIAGNOSIS — Z9842 Cataract extraction status, left eye: Secondary | ICD-10-CM | POA: Diagnosis not present

## 2018-06-19 DIAGNOSIS — E1151 Type 2 diabetes mellitus with diabetic peripheral angiopathy without gangrene: Secondary | ICD-10-CM | POA: Diagnosis not present

## 2018-06-19 DIAGNOSIS — Z9981 Dependence on supplemental oxygen: Secondary | ICD-10-CM | POA: Diagnosis not present

## 2018-06-19 DIAGNOSIS — Z961 Presence of intraocular lens: Secondary | ICD-10-CM | POA: Diagnosis not present

## 2018-06-19 DIAGNOSIS — R0609 Other forms of dyspnea: Secondary | ICD-10-CM | POA: Diagnosis not present

## 2018-06-19 DIAGNOSIS — I130 Hypertensive heart and chronic kidney disease with heart failure and stage 1 through stage 4 chronic kidney disease, or unspecified chronic kidney disease: Secondary | ICD-10-CM | POA: Diagnosis not present

## 2018-06-19 DIAGNOSIS — Z833 Family history of diabetes mellitus: Secondary | ICD-10-CM | POA: Diagnosis not present

## 2018-06-19 DIAGNOSIS — K219 Gastro-esophageal reflux disease without esophagitis: Secondary | ICD-10-CM | POA: Diagnosis not present

## 2018-06-19 DIAGNOSIS — J449 Chronic obstructive pulmonary disease, unspecified: Secondary | ICD-10-CM | POA: Diagnosis present

## 2018-06-19 DIAGNOSIS — R079 Chest pain, unspecified: Secondary | ICD-10-CM

## 2018-06-19 DIAGNOSIS — Z794 Long term (current) use of insulin: Secondary | ICD-10-CM | POA: Diagnosis not present

## 2018-06-19 DIAGNOSIS — Z8249 Family history of ischemic heart disease and other diseases of the circulatory system: Secondary | ICD-10-CM | POA: Diagnosis not present

## 2018-06-19 DIAGNOSIS — I495 Sick sinus syndrome: Secondary | ICD-10-CM | POA: Diagnosis not present

## 2018-06-19 DIAGNOSIS — I200 Unstable angina: Secondary | ICD-10-CM | POA: Diagnosis not present

## 2018-06-19 DIAGNOSIS — I4819 Other persistent atrial fibrillation: Secondary | ICD-10-CM | POA: Diagnosis not present

## 2018-06-19 DIAGNOSIS — I10 Essential (primary) hypertension: Secondary | ICD-10-CM | POA: Diagnosis not present

## 2018-06-19 LAB — BASIC METABOLIC PANEL
Anion gap: 5 (ref 5–15)
BUN: 18 mg/dL (ref 8–23)
CO2: 34 mmol/L — ABNORMAL HIGH (ref 22–32)
Calcium: 8.9 mg/dL (ref 8.9–10.3)
Chloride: 103 mmol/L (ref 98–111)
Creatinine, Ser: 1.09 mg/dL — ABNORMAL HIGH (ref 0.44–1.00)
GFR calc Af Amer: 57 mL/min — ABNORMAL LOW (ref >60)
GFR calc non Af Amer: 49 mL/min — ABNORMAL LOW (ref >60)
Glucose, Bld: 69 mg/dL — ABNORMAL LOW (ref 70–99)
Potassium: 4.6 mmol/L (ref 3.5–5.1)
SODIUM: 142 mmol/L (ref 135–145)

## 2018-06-19 LAB — CBC
HCT: 33.6 % — ABNORMAL LOW (ref 36.0–46.0)
Hemoglobin: 9.8 g/dL — ABNORMAL LOW (ref 12.0–15.0)
MCH: 26.4 pg (ref 26.0–34.0)
MCHC: 29.2 g/dL — AB (ref 30.0–36.0)
MCV: 90.6 fL (ref 80.0–100.0)
Platelets: 296 10*3/uL (ref 150–400)
RBC: 3.71 MIL/uL — ABNORMAL LOW (ref 3.87–5.11)
RDW: 14.9 % (ref 11.5–15.5)
WBC: 6.4 10*3/uL (ref 4.0–10.5)
nRBC: 0 % (ref 0.0–0.2)

## 2018-06-19 LAB — GLUCOSE, CAPILLARY
Glucose-Capillary: 74 mg/dL (ref 70–99)
Glucose-Capillary: 75 mg/dL (ref 70–99)
Glucose-Capillary: 144 mg/dL — ABNORMAL HIGH (ref 70–99)
Glucose-Capillary: 132 mg/dL — ABNORMAL HIGH (ref 70–99)
Glucose-Capillary: 100 mg/dL — ABNORMAL HIGH (ref 70–99)

## 2018-06-19 LAB — MAGNESIUM: Magnesium: 1.8 mg/dL (ref 1.7–2.4)

## 2018-06-19 LAB — HEPARIN LEVEL (UNFRACTIONATED): Heparin Unfractionated: 0.71 IU/mL — ABNORMAL HIGH (ref 0.30–0.70)

## 2018-06-19 LAB — APTT: aPTT: 83 seconds — ABNORMAL HIGH (ref 24–36)

## 2018-06-19 MED ORDER — SODIUM CHLORIDE 0.9 % IV SOLN
INTRAVENOUS | Status: DC
  Administered 2018-06-20: 06:00:00 via INTRAVENOUS

## 2018-06-19 MED ORDER — SODIUM CHLORIDE 0.9 % IV SOLN: 250.0000 mL | INTRAVENOUS | Status: DC | PRN

## 2018-06-19 MED ORDER — INSULIN ASPART PROT & ASPART (70-30 MIX) 100 UNIT/ML ~~LOC~~ SUSP
15.0000 [IU] | Freq: Two times a day (BID) | SUBCUTANEOUS | Status: DC
  Administered 2018-06-19 – 2018-06-21 (×3): 15 [IU] via SUBCUTANEOUS
  Filled 2018-06-19 (×2): qty 10

## 2018-06-19 MED ORDER — ASPIRIN EC 81 MG PO TBEC
81.0000 mg | DELAYED_RELEASE_TABLET | Freq: Every day | ORAL | Status: DC
  Administered 2018-06-19 – 2018-06-21 (×2): 81 mg via ORAL
  Filled 2018-06-19 (×3): qty 1

## 2018-06-19 MED ORDER — ASPIRIN 81 MG PO CHEW
81.0000 mg | CHEWABLE_TABLET | ORAL | Status: AC
  Administered 2018-06-20: 81 mg via ORAL
  Filled 2018-06-19: qty 1

## 2018-06-19 MED ORDER — SODIUM CHLORIDE 0.9% FLUSH: 3.0000 mL | INTRAVENOUS | Status: DC | PRN

## 2018-06-19 MED ORDER — SODIUM CHLORIDE 0.9% FLUSH
3.0000 mL | Freq: Two times a day (BID) | INTRAVENOUS | Status: DC
  Administered 2018-06-19: 3 mL via INTRAVENOUS

## 2018-06-19 NOTE — H&P (View-Only) (Signed)
Cardiology Consultation:   Patient ID: Miranda Sexton MRN: [id number]; DOB: Aug 02, 1940  Admit date: 06/17/2018 Date of Consult: 06/19/2018  Primary Care Provider: Glenda Chroman, MD Primary Cardiologist: Rozann Lesches, MD  Primary Electrophysiologist:  [name], MD    Patient Profile:   Miranda Sexton is a 77 y.o. female with a hx of nonobstructive CAD, sick sinus syndrome status post pacemaker, atrial fibrillation on flecainide who is being seen today for the evaluation of chest pain at the request of Dr. Wynetta Emery.  History of Present Illness:   Ms. Miranda Sexton is a 77 year old female patient with nonobstructive CAD on cath in 2010, PAF followed closely by Dr. Rayann Heman who just saw her 06/09/2018 and flecainide increased to 100 mg twice daily because she has been in atrial fibrillation for several weeks.  She is symptomatic with this.  Mali vas score equals 6 on Eliquis.  Dr. Rayann Heman planned cardioversion with follow-up in A. fib clinic.  Pacemaker was functioning normally.  It was difficult to tell if she was in A. fib on EKG but was clear on pacer interrogation.  Patient also has chronic diastolic CHF, hypertension, IDDM type II, moderate persistent asthma.  Patient was admitted to the hospital 06/17/2018 with left-sided chest pain relieved with rest.  Pain was lasting 10 to 15 minutes.  She had associated dyspnea dizziness and nausea.  Eliquis has been on hold and she was placed on IV heparin.  Troponin 0 0.07, 0.06 and 0.04, BNP 402, potassium 3.4, creatinine 1.18, hemoglobin 10.4 on admission.  Potassium replaced.  Hemoglobin 9.8 today.  She is currently n.p.o.  2D echo 06/18/2018 LVEF 65 to 70% normal wall motion, moderately calcified aortic valve, mild TR increase pulmonary artery pressure 55 mmHg.  Patient says she was at grocery store and developed severe chest pressure radiating down both arms associated with dyspnea."Thought I was going to die". She's never had this before. It  returned with little activity-trying to go upstairs or any exertion. Had mild burning in her chest yesterday. Cardioversion hasn't been scheduled yet.  Past Medical History:  Diagnosis Date  . Anxiety   . Asthma   . Carotid artery occlusion   . Chronic bronchitis ([place])   . Chronic diastolic heart failure ([place])   . CKD (chronic kidney disease) stage 2, GFR 60-89 ml/min   . COPD (chronic obstructive pulmonary disease) ([place])   . Coronary atherosclerosis of native coronary artery    Nonobstructive 08/2008  . Daily headache   . DJD (degenerative joint disease)   . Essential hypertension   . GERD (gastroesophageal reflux disease)   . Hyperlipidemia   . Obesity   . On home oxygen therapy   . PAD (peripheral artery disease) (HCC)    Moderate right renal artery stenosis 08/2008  . Paroxysmal atrial fibrillation (HCC)   . Sick sinus syndrome (HCC)    Medtronic dual-chamber his bundle pacemaker - Dr. Rayann Heman  . Type 2 diabetes mellitus ([place])     Past Surgical History:  Procedure Laterality Date  . BIOPSY  08/03/2017   Procedure: BIOPSY;  Surgeon: [name], MD;  [facility];  Service: Endoscopy;;  gastric   . CARDIAC CATHETERIZATION  2014  . CATARACT EXTRACTION W/ INTRAOCULAR LENS  IMPLANT, BILATERAL Bilateral 2008  . COLONOSCOPY N/A 08/03/2017   Procedure: COLONOSCOPY;  Surgeon: [name], MD;  [facility]: [facility];  Service: Endoscopy;  Laterality: N/A;  . ENDARTERECTOMY Right 12/08/2012   Procedure: ENDARTERECTOMY CAROTID;  Surgeon: [name], MD;  Location: [place];  Service: Vascular;  Laterality: Right;  . EP IMPLANTABLE DEVICE N/A 03/02/2016   Procedure: Pacemaker Implant;  Surgeon: [name], MD;  Location: [facility];  Service: Cardiovascular;  Laterality: N/A;  . ESOPHAGOGASTRODUODENOSCOPY N/A 08/03/2017   Procedure: ESOPHAGOGASTRODUODENOSCOPY (EGD);  Surgeon: [name], MD;  [facility];  Service: Endoscopy;  Laterality:  N/A;  . INSERT / REPLACE / REMOVE PACEMAKER  03/02/2016  . PARTIAL COLECTOMY N/A 10/19/2017   Procedure: PARTIAL COLECTOMY;  Surgeon: [name], MD;  Location: [facility];  Service: General;  Laterality: N/A;  . POLYPECTOMY  08/03/2017   Procedure: POLYPECTOMY;  Surgeon: [name], MD;  [facility];  Service: Endoscopy;;  colon     Home Medications:  Prior to Admission medications   Medication Sig Start Date End Date Taking? Authorizing Provider  acetaminophen (TYLENOL) 325 MG tablet Take 325 mg by mouth every 6 (six) hours as needed for mild pain or headache.   Yes [provider]  albuterol (PROAIR HFA) 108 (90 Base) MCG/ACT inhaler Inhale 2 puffs into the lungs every 6 (six) hours as needed for wheezing or shortness of breath.    Yes [provider]  atorvastatin (LIPITOR) 40 MG tablet Take 40 mg by mouth daily.   Yes [provider]  benazepril (LOTENSIN) 40 MG tablet Take 40 mg by mouth daily.   Yes [provider]  dicyclomine (BENTYL) 10 MG capsule Take 10 mg by mouth 4 (four) times daily -  before meals and at bedtime.   Yes [provider]  ELIQUIS 5 MG TABS tablet TAKE 1 TABLET BY MOUTH TWICE A DAY 02/13/18  Yes Allred, Jeneen Rinks, MD  flecainide (TAMBOCOR) 100 MG tablet Take 1 tablet (100 mg total) by mouth 2 (two) times daily. 06/09/18  Yes Allred, Jeneen Rinks, MD  furosemide (LASIX) 40 MG tablet take 1 tablet by mouth once daily Patient taking differently: TAKE 1 TABLET (40 MG) BY MOUTH DAILY IN THE MORNING, MAY TAKE AN ADDITIONAL DOSE IN THE AFTERNOON IF NEEDED FOR FLUID RETENTION. 10/20/15  Yes Satira Sark, MD  glipiZIDE (GLUCOTROL XL) 10 MG 24 hr tablet Take 10 mg by mouth 2 (two) times daily.    Yes [provider]  insulin aspart protamine- aspart (NOVOLOG 70/30) (70-30) 100 UNIT/ML injection Inject 44-56 Units into the skin See admin instructions. Inject 56 units in the morning & 44 units in the evening.   Yes  [provider]  meclizine (ANTIVERT) 25 MG tablet Take 12.5 mg by mouth 2 (two) times daily as needed for dizziness.   Yes [provider]  metFORMIN (GLUCOPHAGE) 500 MG tablet Take 500 mg by mouth 2 (two) times daily.    Yes [provider]  metoprolol succinate (TOPROL XL) 25 MG 24 hr tablet Take 0.5 tablets (12.5 mg total) by mouth daily. 05/29/18  Yes Satira Sark, MD  Multiple Vitamin (MULTIVITAMIN WITH MINERALS) TABS tablet Take 1 tablet by mouth daily. Centrum Silver   Yes [provider]  olopatadine (PATANOL) 0.1 % ophthalmic solution Place 1 drop into both eyes 2 (two) times daily.  02/25/16  Yes [provider]  pantoprazole (PROTONIX) 40 MG tablet TAKE 1 TABLET BY MOUTH TWICE DAILY BEFORE MEALS 04/13/18  Yes Setzer, Terri L, NP  potassium chloride SA (K-DUR,KLOR-CON) 20 MEQ tablet Take 20 mEq by mouth daily.   Yes [provider]  traMADol Veatrice Bourbon)  50 MG tablet Take 1 tablet (50 mg total) by mouth every 6 (six) hours as needed. 10/22/17  Yes [name], MD  venlafaxine XR (EFFEXOR-XR) 150 MG 24 hr capsule Take 150 mg by mouth daily.   Yes [provider]  GAVILYTE-G 236 g solution Take 4,000 mLs by mouth once. 09/01/17   [provider]    Inpatient Medications: Scheduled Meds: . albuterol  2.5 mg Nebulization Once  . atorvastatin  40 mg Oral Daily  . benazepril  40 mg Oral Daily  . flecainide  100 mg Oral BID  . furosemide  40 mg Oral Daily  . insulin aspart  0-15 Units Subcutaneous TID WC  . insulin aspart protamine- aspart  20 Units Subcutaneous BID WC  . metoprolol succinate  12.5 mg Oral Daily  . pantoprazole  40 mg Oral BID AC  . potassium chloride SA  20 mEq Oral Daily  . venlafaxine XR  150 mg Oral Daily   Continuous Infusions: . heparin 900 Units/hr (06/18/18 1723)   PRN Meds: acetaminophen **OR** acetaminophen, albuterol, nitroGLYCERIN, ondansetron **OR** ondansetron (ZOFRAN) IV,  polyethylene glycol  Allergies:    Allergies  Allergen Reactions  . Codeine Nausea And Vomiting  . Penicillins Rash and Other (See Comments)    Has patient had a PCN reaction causing immediate rash, facial/tongue/throat swelling, SOB or lightheadedness with hypotension: Yes Has patient had a PCN reaction causing severe rash involving mucus membranes or skin necrosis: No Has patient had a PCN reaction that required hospitalization No Has patient had a PCN reaction occurring within the last 10 years: No If all of the above answers are "NO", then may proceed with Cephalosporin use.     Social History:   Social History   Socioeconomic History  . Marital status: Married    Spouse name: Not on file  . Number of children: Not on file  . Years of education: Not on file  . Highest education level: Not on file  Occupational History  . Not on file  Social Needs  . Financial resource strain: Not on file  . Food insecurity:    Worry: Not on file    Inability: Not on file  . Transportation needs:    Medical: Not on file    Non-medical: Not on file  Tobacco Use  . Smoking status: Never Smoker  . Smokeless tobacco: Never Used  Substance and Sexual Activity  . Alcohol use: No    Alcohol/week: 0.0 standard drinks  . Drug use: No  . Sexual activity: Never    Birth control/protection: None  Lifestyle  . Physical activity:    Days per week: Not on file    Minutes per session: Not on file  . Stress: Not on file  Relationships  . Social connections:    Talks on phone: Not on file    Gets together: Not on file    Attends religious service: Not on file    Active member of club or organization: Not on file    Attends meetings of clubs or organizations: Not on file    Relationship status: Not on file  . Intimate partner violence:    Fear of current or ex partner: Not on file    Emotionally abused: Not on file    Physically abused: Not on file    Forced sexual activity: Not on file    Other Topics Concern  . Not on file  Social History Narrative   Lives in [place] with  spouse.  4 grown children.   Retired but continues to clean houses    Family History:    Family History  Problem Relation Age of Onset  . Cancer Mother        Stomach  . Deep vein thrombosis Mother   . Diabetes Mother   . Hypertension Mother   . Heart disease Mother   . Hypertension Father   . Diabetes Sister   . Hyperlipidemia Sister   . Hypertension Sister   . Heart disease Sister   . Diabetes Brother   . Hyperlipidemia Brother   . Hypertension Brother      ROS:  Please see the history of present illness.  Review of Systems  Constitution: Negative.  HENT: Negative.   Eyes: Negative.   Cardiovascular: Positive for chest pain and dyspnea on exertion.  Respiratory: Positive for wheezing.   Hematologic/Lymphatic: Negative.   Musculoskeletal: Negative.  Negative for joint pain.  Gastrointestinal: Negative.   Genitourinary: Negative.   Neurological: Negative.     All other ROS reviewed and negative.     Physical Exam/Data:   Vitals:   06/18/18 1431 06/18/18 1952 06/18/18 2103 06/19/18 0526  BP: 127/63  (!) 114/55 137/68  Pulse: (!) 59  (!) 59 61  Resp: 17     Temp: 98.2 F (36.8 C)  98 F (36.7 C) 98 F (36.7 C)  TempSrc:   Oral Oral  SpO2: 99% 98% 100% 100%  Weight:      Height:        Intake/Output Summary (Last 24 hours) at 06/19/2018 0809 Last data filed at 06/19/2018 0602 Gross per 24 hour  Intake 441.44 ml  Output -  Net 441.44 ml   Filed Weights   06/17/18 1408  Weight: 104.3 kg   Body mass index is 38.27 kg/m.  General:  Obese, in no acute distress  HEENT: normal Lymph: no adenopathy Neck: slight increase JVD Endocrine:  No thryomegaly Vascular: No carotid bruits; FA pulses 2+ bilaterally without bruits  Cardiac:  normal S1, S2; RRR; 1/6 systolic murmur LSB Lungs:  clear to auscultation bilaterally, no wheezing, rhonchi or rales  Abd: soft,  nontender, no hepatomegaly  Ext: no edema Musculoskeletal:  No deformities, BUE and BLE strength normal and equal Skin: warm and dry  Neuro:  CNs 2-12 intact, no focal abnormalities noted Psych:  Normal affect   EKG:  The EKG was personally reviewed and demonstrates:  Paced rhythm looks like underlying afib Telemetry:  Telemetry was personally reviewed and demonstrates:  Paced   Relevant CV Studies:  2D echo 12/15/2019Study Conclusions   - Left ventricle: The cavity size was normal. Wall thickness was   normal. Systolic function was vigorous. The estimated ejection   fraction was in the range of 65% to 70%. Wall motion was normal;   there were no regional wall motion abnormalities. Left   ventricular diastolic function parameters were normal. - Ventricular septum: The contour showed diastolic flattening. - Aortic valve: Trileaflet; moderately thickened, moderately   calcified leaflets. Valve mobility was restricted. Valve area   (Vmax): 1.45 cm^2. - Tricuspid valve: There was mild regurgitation. - Pulmonary arteries: Systolic pressure was moderately increased.   PA peak pressure: 55 mm Hg (S).    Cardiac catheterization 2010  Coronary angiography: Coronary dominance: left   Left mainstem: No angiographic CAD.    Left anterior descending (LAD): Luminal irregularities.   Left circumflex (LCx): Dominant vessel, no angiographic CAD.    Right coronary artery (RCA):  Small nondominant vessel.    Left ventriculography: Left ventricular systolic function is normal, LVEF is estimated at 60-65%, there is no significant mitral regurgitation    Final Conclusions:  RHC showed that left and right heart filling pressures are not significantly elevated.  I do not note pulmonary hypertension (echo suggested pulmonary HTN).  No significant CAD.  Normal LV systolic function.  I cannot explain the patient's dyspnea from the findings of this procedure.    Recommendations:  Followup in [place]  office in 2 wks.    Loralie Champagne 11/16/2012, 10:31 AM   Laboratory Data:  Chemistry Recent Labs  Lab 06/17/18 1431 06/18/18 0724 06/19/18 0542  NA 137 140 142  K 3.4* 4.0 4.6  CL 102 105 103  CO2 27 28 34*  GLUCOSE 205* 90 69*  BUN 19 19 18   CREATININE 1.18* 1.01* 1.09*  CALCIUM 8.7* 8.7* 8.9  GFRNONAA 44* 54* 49*  GFRAA 52* >60 57*  ANIONGAP 8 7 5     No results for input(s): PROT, ALBUMIN, AST, ALT, ALKPHOS, BILITOT in the last 168 hours. Hematology Recent Labs  Lab 06/17/18 1431 06/18/18 0724 06/19/18 0542  WBC 5.1 6.3 6.4  RBC 3.89 3.74* 3.71*  HGB 10.4* 10.0* 9.8*  HCT 34.4* 33.6* 33.6*  MCV 88.4 89.8 90.6  MCH 26.7 26.7 26.4  MCHC 30.2 29.8* 29.2*  RDW 14.8 14.9 14.9  PLT 275 281 296   Cardiac Enzymes Recent Labs  Lab 06/17/18 1431 06/17/18 1604 06/17/18 2157  TROPONINI 0.07* 0.06* 0.04*    Recent Labs  Lab 06/17/18 1436  TROPIPOC 0.03    BNP Recent Labs  Lab 06/17/18 1431  BNP 402.0*    DDimer No results for input(s): DDIMER in the last 168 hours.  Radiology/Studies:  Dg Chest 2 View  Result Date: 06/17/2018 CLINICAL DATA:  Chest pain for several days EXAM: CHEST - 2 VIEW COMPARISON:  December 31, 2017 FINDINGS: Stable mild cardiomegaly. The hila and mediastinum are stable. The pacemaker stable. Mild atelectasis in the bases. No suspicious infiltrates. No overt edema. IMPRESSION: No active cardiopulmonary disease. Electronically Signed   By: Dorise Bullion III M.D   On: 06/17/2018 14:44    Assessment and Plan:   1. Chest pain with elevated but flat troponins.  2D echo yesterday with normal LV function no wall motion abnormalities. Symptoms consistent with unstable angina.  Question wether could be related to increase in flecainide and mild CHF.No evidence of rapid afib. Discussed with Dr. Rayann Heman who recommends we transfer to [facility] with plans for cath and then cardioversion prior to discharge. Pacer interrogation can be done at [facility]. Dr.  Harl Bowie to see. 2. Nonobstructive CAD on cath in 2010 but with symptoms of unstable angina.  3. Persistent atrial fibrillation on flecainide just increased to 100 mg twice daily 06/09/2018 with plans for cardioversion.  Mali vas score equals 6 on Eliquis but this is on hold while she is on IV heparin  4. Sick sinus syndrome status post Medtronic pacemaker followed closely by Dr. Rayann Heman 5. Hypertension 6. IDDM type II 7. Persistent asthma with wheezing on exam 8. Diastolic CHF-on Lasix 40 mg daily.      For questions or updates, please contact [place] Please consult [email or web address] for contact info under    Signed, Ermalinda Barrios, PA-C  06/19/2018 8:09 AM  Patient seen and discussed with PA Bonnell Public, I agree with her documentation above. 77 yo female history of medtronic dual chamber pacemaker, AFib, COPD on home O2,  CKD 2, PAD, nonobstructive CAD by cath 2010. Presents with chest pain.  She reports episodes of sharp left sided chest pain starting 2-3 days ago. Associated with signiifcant SOB. Last about 12-13 minutes. Can be worst with position or movement. Has been progressing in severity since onset. Never had this typo of pain before.   Admission labs  BNP 402, WBC 5.1 Hgb 10.4 Plt 275 K 3.4 Cr 1.18 LDL 49  Trop 0.07-->0.06-->0.04--> EKG AV paced CXR no acute process Echo LVE 65-70%, no WMAs, flattened ventricular septum, PASP 55 11/2012 cath: no significant CAD. RHC without significant pulm HTN, calculated mean PA 16   Patient presents with escalating chest pain, mildly elevated troponin. Some typical and atypical characterisitcs, however given her DM2 at risk for atypical angina. We will plan for a cath to defintively evaluate for obstructive disease. Medical therapy with statin, ACE-I, hep gtt. eliquis on hold, start ASA for now, d/c if normal coronaries. Regarding her afib flecanide recently increased by EP, plans for outpatient DCCV, technically hep gtt was started day eliquis  was held and thus timing of DCCV may not be affected. Our PA discussed with Dr Rayann Heman and to consider DCCV this admission pending cath.  If CAD confiremd would need to come off flecanide.  Elevated PASP by echos historically, RHC in 2014 with normal PA pressures.    Carlyle Dolly MD

## 2018-06-19 NOTE — Consult Note (Addendum)
Cardiology Consultation:   Patient ID: [name] MRN: [id number]; DOB: 25-Feb-1941  Admit date: 06/17/2018 Date of Consult: 06/19/2018  Primary Care Provider: Glenda Chroman, MD Primary Cardiologist: Rozann Lesches, MD  Primary Electrophysiologist:  [name], MD    Patient Profile:   Miranda Sexton is a 77 y.o. female with a hx of nonobstructive CAD, sick sinus syndrome status post pacemaker, atrial fibrillation on flecainide who is being seen today for the evaluation of chest pain at the request of Dr. Wynetta Emery.  History of Present Illness:   Ms. Miranda Sexton is a 77 year old female patient with nonobstructive CAD on cath in 2010, PAF followed closely by Dr. Rayann Heman who just saw her 06/09/2018 and flecainide increased to 100 mg twice daily because she has been in atrial fibrillation for several weeks.  She is symptomatic with this.  Mali vas score equals 6 on Eliquis.  Dr. Rayann Heman planned cardioversion with follow-up in A. fib clinic.  Pacemaker was functioning normally.  It was difficult to tell if she was in A. fib on EKG but was clear on pacer interrogation.  Patient also has chronic diastolic CHF, hypertension, IDDM type II, moderate persistent asthma.  Patient was admitted to the hospital 06/17/2018 with left-sided chest pain relieved with rest.  Pain was lasting 10 to 15 minutes.  She had associated dyspnea dizziness and nausea.  Eliquis has been on hold and she was placed on IV heparin.  Troponin 0 0.07, 0.06 and 0.04, BNP 402, potassium 3.4, creatinine 1.18, hemoglobin 10.4 on admission.  Potassium replaced.  Hemoglobin 9.8 today.  She is currently n.p.o.  2D echo 06/18/2018 LVEF 65 to 70% normal wall motion, moderately calcified aortic valve, mild TR increase pulmonary artery pressure 55 mmHg.  Patient says she was at grocery store and developed severe chest pressure radiating down both arms associated with dyspnea."Thought I was going to die". She's never had this before. It  returned with little activity-trying to go upstairs or any exertion. Had mild burning in her chest yesterday. Cardioversion hasn't been scheduled yet.  Past Medical History:  Diagnosis Date  . Anxiety   . Asthma   . Carotid artery occlusion   . Chronic bronchitis ([place])   . Chronic diastolic heart failure ([place])   . CKD (chronic kidney disease) stage 2, GFR 60-89 ml/min   . COPD (chronic obstructive pulmonary disease) ([place])   . Coronary atherosclerosis of native coronary artery    Nonobstructive 08/2008  . Daily headache   . DJD (degenerative joint disease)   . Essential hypertension   . GERD (gastroesophageal reflux disease)   . Hyperlipidemia   . Obesity   . On home oxygen therapy   . PAD (peripheral artery disease) ([facility])    Moderate right renal artery stenosis 08/2008  . Paroxysmal atrial fibrillation ([facility])   . Sick sinus syndrome ([facility])    Medtronic dual-chamber his bundle pacemaker - Dr. Rayann Heman  . Type 2 diabetes mellitus ([place])     Past Surgical History:  Procedure Laterality Date  . BIOPSY  08/03/2017   Procedure: BIOPSY;  Surgeon: [name], MD;  [facility];  Service: Endoscopy;;  gastric   . CARDIAC CATHETERIZATION  2014  . CATARACT EXTRACTION W/ INTRAOCULAR LENS  IMPLANT, BILATERAL Bilateral 2008  . COLONOSCOPY N/A 08/03/2017   Procedure: COLONOSCOPY;  Surgeon: [name], MD;  [facility];  Service: Endoscopy;  Laterality: N/A;  . ENDARTERECTOMY Right 12/08/2012   Procedure: ENDARTERECTOMY CAROTID;  Surgeon: [name], MD;  Location: [facility];  Service: Vascular;  Laterality: Right;  . EP IMPLANTABLE DEVICE N/A 03/02/2016   Procedure: Pacemaker Implant;  Surgeon: [name], MD;  Location: [facility];  Service: Cardiovascular;  Laterality: N/A;  . ESOPHAGOGASTRODUODENOSCOPY N/A 08/03/2017   Procedure: ESOPHAGOGASTRODUODENOSCOPY (EGD);  Surgeon: [name], MD;  [facility];  Service: Endoscopy;  Laterality:  N/A;  . INSERT / REPLACE / REMOVE PACEMAKER  03/02/2016  . PARTIAL COLECTOMY N/A 10/19/2017   Procedure: PARTIAL COLECTOMY;  Surgeon: [name], MD;  Location: [facility];  Service: General;  Laterality: N/A;  . POLYPECTOMY  08/03/2017   Procedure: POLYPECTOMY;  Surgeon: [name], MD;  [facility];  Service: Endoscopy;;  colon     Home Medications:  Prior to Admission medications   Medication Sig Start Date End Date Taking? Authorizing Provider  acetaminophen (TYLENOL) 325 MG tablet Take 325 mg by mouth every 6 (six) hours as needed for mild pain or headache.   Yes [provider]  albuterol (PROAIR HFA) 108 (90 Base) MCG/ACT inhaler Inhale 2 puffs into the lungs every 6 (six) hours as needed for wheezing or shortness of breath.    Yes [provider]  atorvastatin (LIPITOR) 40 MG tablet Take 40 mg by mouth daily.   Yes [provider]  benazepril (LOTENSIN) 40 MG tablet Take 40 mg by mouth daily.   Yes [provider]  dicyclomine (BENTYL) 10 MG capsule Take 10 mg by mouth 4 (four) times daily -  before meals and at bedtime.   Yes [provider]  ELIQUIS 5 MG TABS tablet TAKE 1 TABLET BY MOUTH TWICE A DAY 02/13/18  Yes Allred, Jeneen Rinks, MD  flecainide (TAMBOCOR) 100 MG tablet Take 1 tablet (100 mg total) by mouth 2 (two) times daily. 06/09/18  Yes Allred, Jeneen Rinks, MD  furosemide (LASIX) 40 MG tablet take 1 tablet by mouth once daily Patient taking differently: TAKE 1 TABLET (40 MG) BY MOUTH DAILY IN THE MORNING, MAY TAKE AN ADDITIONAL DOSE IN THE AFTERNOON IF NEEDED FOR FLUID RETENTION. 10/20/15  Yes Satira Sark, MD  glipiZIDE (GLUCOTROL XL) 10 MG 24 hr tablet Take 10 mg by mouth 2 (two) times daily.    Yes [provider]  insulin aspart protamine- aspart (NOVOLOG 70/30) (70-30) 100 UNIT/ML injection Inject 44-56 Units into the skin See admin instructions. Inject 56 units in the morning & 44 units in the evening.   Yes  [provider]  meclizine (ANTIVERT) 25 MG tablet Take 12.5 mg by mouth 2 (two) times daily as needed for dizziness.   Yes [provider]  metFORMIN (GLUCOPHAGE) 500 MG tablet Take 500 mg by mouth 2 (two) times daily.    Yes [provider]  metoprolol succinate (TOPROL XL) 25 MG 24 hr tablet Take 0.5 tablets (12.5 mg total) by mouth daily. 05/29/18  Yes Satira Sark, MD  Multiple Vitamin (MULTIVITAMIN WITH MINERALS) TABS tablet Take 1 tablet by mouth daily. Centrum Silver   Yes [provider]  olopatadine (PATANOL) 0.1 % ophthalmic solution Place 1 drop into both eyes 2 (two) times daily.  02/25/16  Yes [provider]  pantoprazole (PROTONIX) 40 MG tablet TAKE 1 TABLET BY MOUTH TWICE DAILY BEFORE MEALS 04/13/18  Yes Setzer, Terri L, NP  potassium chloride SA (K-DUR,KLOR-CON) 20 MEQ tablet Take 20 mEq by mouth daily.   Yes [provider]  traMADol Veatrice Bourbon)  50 MG tablet Take 1 tablet (50 mg total) by mouth every 6 (six) hours as needed. 10/22/17  Yes [name], MD  venlafaxine XR (EFFEXOR-XR) 150 MG 24 hr capsule Take 150 mg by mouth daily.   Yes [provider]  GAVILYTE-G 236 g solution Take 4,000 mLs by mouth once. 09/01/17   [provider]    Inpatient Medications: Scheduled Meds: . albuterol  2.5 mg Nebulization Once  . atorvastatin  40 mg Oral Daily  . benazepril  40 mg Oral Daily  . flecainide  100 mg Oral BID  . furosemide  40 mg Oral Daily  . insulin aspart  0-15 Units Subcutaneous TID WC  . insulin aspart protamine- aspart  20 Units Subcutaneous BID WC  . metoprolol succinate  12.5 mg Oral Daily  . pantoprazole  40 mg Oral BID AC  . potassium chloride SA  20 mEq Oral Daily  . venlafaxine XR  150 mg Oral Daily   Continuous Infusions: . heparin 900 Units/hr (06/18/18 1723)   PRN Meds: acetaminophen **OR** acetaminophen, albuterol, nitroGLYCERIN, ondansetron **OR** ondansetron (ZOFRAN) IV,  polyethylene glycol  Allergies:    Allergies  Allergen Reactions  . Codeine Nausea And Vomiting  . Penicillins Rash and Other (See Comments)    Has patient had a PCN reaction causing immediate rash, facial/tongue/throat swelling, SOB or lightheadedness with hypotension: Yes Has patient had a PCN reaction causing severe rash involving mucus membranes or skin necrosis: No Has patient had a PCN reaction that required hospitalization No Has patient had a PCN reaction occurring within the last 10 years: No If all of the above answers are "NO", then may proceed with Cephalosporin use.     Social History:   Social History   Socioeconomic History  . Marital status: Married    Spouse name: Not on file  . Number of children: Not on file  . Years of education: Not on file  . Highest education level: Not on file  Occupational History  . Not on file  Social Needs  . Financial resource strain: Not on file  . Food insecurity:    Worry: Not on file    Inability: Not on file  . Transportation needs:    Medical: Not on file    Non-medical: Not on file  Tobacco Use  . Smoking status: Never Smoker  . Smokeless tobacco: Never Used  Substance and Sexual Activity  . Alcohol use: No    Alcohol/week: 0.0 standard drinks  . Drug use: No  . Sexual activity: Never    Birth control/protection: None  Lifestyle  . Physical activity:    Days per week: Not on file    Minutes per session: Not on file  . Stress: Not on file  Relationships  . Social connections:    Talks on phone: Not on file    Gets together: Not on file    Attends religious service: Not on file    Active member of club or organization: Not on file    Attends meetings of clubs or organizations: Not on file    Relationship status: Not on file  . Intimate partner violence:    Fear of current or ex partner: Not on file    Emotionally abused: Not on file    Physically abused: Not on file    Forced sexual activity: Not on file    Other Topics Concern  . Not on file  Social History Narrative   Lives in [place] with  spouse.  4 grown children.   Retired but continues to clean houses    Family History:    Family History  Problem Relation Age of Onset  . Cancer Mother        Stomach  . Deep vein thrombosis Mother   . Diabetes Mother   . Hypertension Mother   . Heart disease Mother   . Hypertension Father   . Diabetes Sister   . Hyperlipidemia Sister   . Hypertension Sister   . Heart disease Sister   . Diabetes Brother   . Hyperlipidemia Brother   . Hypertension Brother      ROS:  Please see the history of present illness.  Review of Systems  Constitution: Negative.  HENT: Negative.   Eyes: Negative.   Cardiovascular: Positive for chest pain and dyspnea on exertion.  Respiratory: Positive for wheezing.   Hematologic/Lymphatic: Negative.   Musculoskeletal: Negative.  Negative for joint pain.  Gastrointestinal: Negative.   Genitourinary: Negative.   Neurological: Negative.     All other ROS reviewed and negative.     Physical Exam/Data:   Vitals:   06/18/18 1431 06/18/18 1952 06/18/18 2103 06/19/18 0526  BP: 127/63  (!) 114/55 137/68  Pulse: (!) 59  (!) 59 61  Resp: 17     Temp: 98.2 F (36.8 C)  98 F (36.7 C) 98 F (36.7 C)  TempSrc:   Oral Oral  SpO2: 99% 98% 100% 100%  Weight:      Height:        Intake/Output Summary (Last 24 hours) at 06/19/2018 0809 Last data filed at 06/19/2018 0602 Gross per 24 hour  Intake 441.44 ml  Output -  Net 441.44 ml   Filed Weights   06/17/18 1408  Weight: 104.3 kg   Body mass index is 38.27 kg/m.  General:  Obese, in no acute distress  HEENT: normal Lymph: no adenopathy Neck: slight increase JVD Endocrine:  No thryomegaly Vascular: No carotid bruits; FA pulses 2+ bilaterally without bruits  Cardiac:  normal S1, S2; RRR; 1/6 systolic murmur LSB Lungs:  clear to auscultation bilaterally, no wheezing, rhonchi or rales  Abd: soft,  nontender, no hepatomegaly  Ext: no edema Musculoskeletal:  No deformities, BUE and BLE strength normal and equal Skin: warm and dry  Neuro:  CNs 2-12 intact, no focal abnormalities noted Psych:  Normal affect   EKG:  The EKG was personally reviewed and demonstrates:  Paced rhythm looks like underlying afib Telemetry:  Telemetry was personally reviewed and demonstrates:  Paced   Relevant CV Studies:  2D echo 12/15/2019Study Conclusions   - Left ventricle: The cavity size was normal. Wall thickness was   normal. Systolic function was vigorous. The estimated ejection   fraction was in the range of 65% to 70%. Wall motion was normal;   there were no regional wall motion abnormalities. Left   ventricular diastolic function parameters were normal. - Ventricular septum: The contour showed diastolic flattening. - Aortic valve: Trileaflet; moderately thickened, moderately   calcified leaflets. Valve mobility was restricted. Valve area   (Vmax): 1.45 cm^2. - Tricuspid valve: There was mild regurgitation. - Pulmonary arteries: Systolic pressure was moderately increased.   PA peak pressure: 55 mm Hg (S).    Cardiac catheterization 2010  Coronary angiography: Coronary dominance: left   Left mainstem: No angiographic CAD.    Left anterior descending (LAD): Luminal irregularities.   Left circumflex (LCx): Dominant vessel, no angiographic CAD.    Right coronary artery (RCA):  Small nondominant vessel.    Left ventriculography: Left ventricular systolic function is normal, LVEF is estimated at 60-65%, there is no significant mitral regurgitation    Final Conclusions:  RHC showed that left and right heart filling pressures are not significantly elevated.  I do not note pulmonary hypertension (echo suggested pulmonary HTN).  No significant CAD.  Normal LV systolic function.  I cannot explain the patient's dyspnea from the findings of this procedure.    Recommendations:  Followup in [place]  office in 2 wks.    Loralie Champagne 11/16/2012, 10:31 AM   Laboratory Data:  Chemistry Recent Labs  Lab 06/17/18 1431 06/18/18 0724 06/19/18 0542  NA 137 140 142  K 3.4* 4.0 4.6  CL 102 105 103  CO2 27 28 34*  GLUCOSE 205* 90 69*  BUN 19 19 18   CREATININE 1.18* 1.01* 1.09*  CALCIUM 8.7* 8.7* 8.9  GFRNONAA 44* 54* 49*  GFRAA 52* >60 57*  ANIONGAP 8 7 5     No results for input(s): PROT, ALBUMIN, AST, ALT, ALKPHOS, BILITOT in the last 168 hours. Hematology Recent Labs  Lab 06/17/18 1431 06/18/18 0724 06/19/18 0542  WBC 5.1 6.3 6.4  RBC 3.89 3.74* 3.71*  HGB 10.4* 10.0* 9.8*  HCT 34.4* 33.6* 33.6*  MCV 88.4 89.8 90.6  MCH 26.7 26.7 26.4  MCHC 30.2 29.8* 29.2*  RDW 14.8 14.9 14.9  PLT 275 281 296   Cardiac Enzymes Recent Labs  Lab 06/17/18 1431 06/17/18 1604 06/17/18 2157  TROPONINI 0.07* 0.06* 0.04*    Recent Labs  Lab 06/17/18 1436  TROPIPOC 0.03    BNP Recent Labs  Lab 06/17/18 1431  BNP 402.0*    DDimer No results for input(s): DDIMER in the last 168 hours.  Radiology/Studies:  Dg Chest 2 View  Result Date: 06/17/2018 CLINICAL DATA:  Chest pain for several days EXAM: CHEST - 2 VIEW COMPARISON:  December 31, 2017 FINDINGS: Stable mild cardiomegaly. The hila and mediastinum are stable. The pacemaker stable. Mild atelectasis in the bases. No suspicious infiltrates. No overt edema. IMPRESSION: No active cardiopulmonary disease. Electronically Signed   By: Dorise Bullion III M.D   On: 06/17/2018 14:44    Assessment and Plan:   1. Chest pain with elevated but flat troponins.  2D echo yesterday with normal LV function no wall motion abnormalities. Symptoms consistent with unstable angina.  Question wether could be related to increase in flecainide and mild CHF.No evidence of rapid afib. Discussed with Dr. Rayann Heman who [facility] with plans for cath and then cardioversion prior to discharge. Pacer interrogation [facility] be done at [facility] Dr.  Harl Bowie to see. 2. Nonobstructive CAD on cath in 2010 but with symptoms of unstable angina.  3. Persistent atrial fibrillation on flecainide just increased to 100 mg twice daily 06/09/2018 with plans for cardioversion.  Mali vas score equals 6 on Eliquis but this is on hold while she is on IV heparin  4. Sick sinus syndrome status post Medtronic pacemaker followed closely by Dr. Rayann Heman 5. Hypertension 6. IDDM type II 7. Persistent asthma with wheezing on exam 8. Diastolic CHF-on Lasix 40 mg daily.      For questions or updates, please contact [place] Please consult [email or web address] for contact info under    Signed, Ermalinda Barrios, PA-C  06/19/2018 8:09 AM  Patient seen and discussed with PA Bonnell Public, I agree with her documentation above. 77 yo female history of medtronic dual chamber pacemaker, AFib, COPD on home O2,  CKD 2, PAD, nonobstructive CAD by cath 2010. Presents with chest pain.  She reports episodes of sharp left sided chest pain starting 2-3 days ago. Associated with signiifcant SOB. Last about 12-13 minutes. Can be worst with position or movement. Has been progressing in severity since onset. Never had this typo of pain before.   Admission labs  BNP 402, WBC 5.1 Hgb 10.4 Plt 275 K 3.4 Cr 1.18 LDL 49  Trop 0.07-->0.06-->0.04--> EKG AV paced CXR no acute process Echo LVE 65-70%, no WMAs, flattened ventricular septum, PASP 55 11/2012 cath: no significant CAD. RHC without significant pulm HTN, calculated mean PA 16   Patient presents with escalating chest pain, mildly elevated troponin. Some typical and atypical characterisitcs, however given her DM2 at risk for atypical angina. We will plan for a cath to defintively evaluate for obstructive disease. Medical therapy with statin, ACE-I, hep gtt. eliquis on hold, start ASA for now, d/c if normal coronaries. Regarding her afib flecanide recently increased by EP, plans for outpatient DCCV, technically hep gtt was started day eliquis  was held and thus timing of DCCV may not be affected. Our PA discussed with Dr Rayann Heman and to consider DCCV this admission pending cath.  If CAD confiremd would need to come off flecanide.  Elevated PASP by echos historically, RHC in 2014 with normal PA pressures.    Carlyle Dolly MD

## 2018-06-19 NOTE — Progress Notes (Signed)
Inpatient Diabetes Program Recommendations  AACE/ADA: New Consensus Statement on Inpatient Glycemic Control (2019)  Target Ranges:  Prepandial:   less than 140 mg/dL      Peak postprandial:   less than 180 mg/dL (1-2 hours)      Critically ill patients:  140 - 180 mg/dL  Results for SHIR, BERGMAN (MRN [id number]) as of 06/19/2018 09:38  Ref. Range 06/18/2018 07:59 06/19/2018 08:13  Glucose-Capillary Latest Ref Range: 70 - 99 mg/dL 87 74   Results for ERNESTINE, ROHMAN (MRN [id number]) as of 06/19/2018 09:38  Ref. Range 06/17/2018 14:31 06/18/2018 07:24 06/19/2018 05:42  Glucose Latest Ref Range: 70 - 99 mg/dL 205 (H) 90 69 (L)   Review of Glycemic Control  Diabetes history: DM2 Outpatient Diabetes medications: 70/30 56 units QAM, 70/30 44 units QPM, Glipizide XL 10 mg BID, Metformin 500 mg BID Current orders for Inpatient glycemic control: 70/30 20 units BID, Novolog 0-15 units TID with meals  Inpatient Diabetes Program Recommendations:  Insulin - Basal: Please consider decreasing 70/30 to 17 units BID.   Thanks, Barnie Alderman, RN, MSN, CDE Diabetes Coordinator Inpatient Diabetes Program [phone] (Team Pager from 8am to 5pm)

## 2018-06-19 NOTE — Progress Notes (Signed)
PROGRESS NOTE  Miranda Sexton  OXB:[id number]  DOB: 29-Dec-1940  DOA: 06/17/2018 PCP: Glenda Chroman, MD   Brief Admission Hx: 77 y.o. female with medical history significant for sick sinus syndrome status post dual chamber pacer, accessible A. fib diastolic CHF, carotid artery disease, who presented to the ED with complaints of left-sided chest pain of 3 days duration, radiates to both arms, lasting 10 to 15 minutes, aggravated by exertion, relieved by rest.  Chest pain is associated with dizziness shortness of breath, nausea.  No vomiting.   MDM/Assessment & Plan:   1. Chest pain at rest-patient has a history of coronary artery disease.  Pt had a small chest pain episode yesterday that lasted briefly but frightened her.  Her initial troponin was 0.07 it is now trended down.    She remains on a heparin infusion.  Her apixaban is being held.  She was continued on metoprolol, statin and nitroglycerin.  Cardiology is consulted and plan to transfer patient to [facility] for cath and possible cardioversion.  Continue on heparin infusion.  Continue supportive care.  2. Chronic diastolic CHF- her initial BNP was elevated.  Her chest x-ray was without pulmonary edema.  She is on Lasix 40 mg daily to control symptoms.  She is also on chronic oxygen nightly 2 L. 3. Sick sinus syndrome with paroxysmal atrial fibrillation-patient is currently dual paced.  She is followed by EP cardiology.  She is on flecainide and metoprolol which has been resumed and she is fully anticoagulated on an IV heparin infusion. 4. Hypokalemia- repleted at admission. Following.  5. Essential hypertension-has remained stable and following closely while on home medications. 6. Insulin-dependent type 2 diabetes mellitus-metformin is being held, patient on sliding scale insulin and resumed her insulin 70/30 at a reduced dose.  Monitor CBG 5 times daily.   7. Hypoglycemia - reduced 70/30 to 15 units BID with meals only.  Hold if NPO or not  eating over 40% of meal.  8. Moderate persistent asthma- albuterol nebs ordered as needed. 9. Prolonged QTC-patient has a dual chamber pacemaker and is on flecainide and followed by EP cardiology.  DVT prophylaxis: IV heparin infusion Code Status: Full Family Communication: Patient updated at bedside Disposition Plan: transfer to [facility]   Consultants:  Inpatient cardiology heart care  Procedures:  N/A  Antimicrobials:  N/A  Subjective: Patient says she had some chest pain for brief episode yesterday but has since resolved. No SOB.   Objective: Vitals:   06/18/18 1952 06/18/18 2103 06/19/18 0526 06/19/18 0849  BP:  (!) 114/55 137/68 113/68  Pulse:  (!) 59 61 65  Resp:      Temp:  98 F (36.7 C) 98 F (36.7 C)   TempSrc:  Oral Oral   SpO2: 98% 100% 100%   Weight:      Height:        Intake/Output Summary (Last 24 hours) at 06/19/2018 1114 Last data filed at 06/19/2018 0602 Gross per 24 hour  Intake 441.44 ml  Output -  Net 441.44 ml   Filed Weights   06/17/18 1408  Weight: 104.3 kg   REVIEW OF SYSTEMS  As per history otherwise all reviewed and reported negative  Exam:  General exam: Awake, alert, no distress, cooperative and pleasant Respiratory system: Bilateral breath sounds. No increased work of breathing. Cardiovascular system: Normal S1 & S2 heard. No JVD, murmurs, gallops, clicks or pedal edema. Gastrointestinal system: Abdomen is nondistended, soft and nontender. Normal bowel sounds heard.  Central nervous system: Alert and oriented. No focal neurological deficits. Extremities: Trace pretibial edema bilateral lower extremities.  2+ pulses in upper and lower extremities noted.  Data Reviewed: Basic Metabolic Panel: Recent Labs  Lab 06/17/18 1431 06/17/18 1620 06/18/18 0724 06/19/18 0542  NA 137  --  140 142  K 3.4*  --  4.0 4.6  CL 102  --  105 103  CO2 27  --  28 34*  GLUCOSE 205*  --  90 69*  BUN 19  --  19 18  CREATININE 1.18*   --  1.01* 1.09*  CALCIUM 8.7*  --  8.7* 8.9  MG  --  1.8  --  1.8   Liver Function Tests: No results for input(s): AST, ALT, ALKPHOS, BILITOT, PROT, ALBUMIN in the last 168 hours. No results for input(s): LIPASE, AMYLASE in the last 168 hours. No results for input(s): AMMONIA in the last 168 hours. CBC: Recent Labs  Lab 06/17/18 1431 06/18/18 0724 06/19/18 0542  WBC 5.1 6.3 6.4  HGB 10.4* 10.0* 9.8*  HCT 34.4* 33.6* 33.6*  MCV 88.4 89.8 90.6  PLT 275 281 296   Cardiac Enzymes: Recent Labs  Lab 06/17/18 1431 06/17/18 1604 06/17/18 2157  TROPONINI 0.07* 0.06* 0.04*   CBG (last 3)  Recent Labs    06/18/18 0759 06/19/18 0813  GLUCAP 87 74   No results found for this or any previous visit (from the past 240 hour(s)).   Studies: Dg Chest 2 View  Result Date: 06/17/2018 CLINICAL DATA:  Chest pain for several days EXAM: CHEST - 2 VIEW COMPARISON:  December 31, 2017 FINDINGS: Stable mild cardiomegaly. The hila and mediastinum are stable. The pacemaker stable. Mild atelectasis in the bases. No suspicious infiltrates. No overt edema. IMPRESSION: No active cardiopulmonary disease. Electronically Signed   By: Dorise Bullion III M.D   On: 06/17/2018 14:44   Scheduled Meds: . albuterol  2.5 mg Nebulization Once  . aspirin EC  81 mg Oral Daily  . atorvastatin  40 mg Oral Daily  . benazepril  40 mg Oral Daily  . flecainide  100 mg Oral BID  . furosemide  40 mg Oral Daily  . insulin aspart  0-15 Units Subcutaneous TID WC  . insulin aspart protamine- aspart  15 Units Subcutaneous BID WC  . metoprolol succinate  12.5 mg Oral Daily  . pantoprazole  40 mg Oral BID AC  . potassium chloride SA  20 mEq Oral Daily  . venlafaxine XR  150 mg Oral Daily   Continuous Infusions: . heparin 900 Units/hr (06/18/18 1723)    Principal Problem:   Chest pain Active Problems:   CORONARY ATHEROSCLEROSIS NATIVE CORONARY ARTERY   Essential hypertension, benign   Chronic diastolic heart failure  (HCC)   Sick sinus syndrome ([place])  Time spent:   Irwin Brakeman, MD, FAAFP [facility] Pager [phone]  If 7PM-7AM, please contact night-coverage [email or web address] Password [email or web address] 06/19/2018, 11:14 AM    LOS: 0 days

## 2018-06-19 NOTE — Progress Notes (Signed)
[place] for heparin Indication: ACS/STEMI  Allergies  Allergen Reactions  . Codeine Nausea And Vomiting  . Penicillins Rash and Other (See Comments)    Has patient had a PCN reaction causing immediate rash, facial/tongue/throat swelling, SOB or lightheadedness with hypotension: Yes Has patient had a PCN reaction causing severe rash involving mucus membranes or skin necrosis: No Has patient had a PCN reaction that required hospitalization No Has patient had a PCN reaction occurring within the last 10 years: No If all of the above answers are "NO", then may proceed with Cephalosporin use.     Patient Measurements: Height: 5\' 5"  (165.1 cm) Weight: 230 lb (104.3 kg) IBW/kg (Calculated) : 57 Heparin Dosing Weight: 81 kg  Vital Signs: Temp: 98 F (36.7 C) (12/16 0526) Temp Source: Oral (12/16 0526) BP: 137/68 (12/16 0526) Pulse Rate: 61 (12/16 0526)  Labs: Recent Labs    06/17/18 1431  06/17/18 1604 06/17/18 2157 06/18/18 0724 06/18/18 1647 06/19/18 0542  HGB 10.4*  --   --   --  10.0*  --  9.8*  HCT 34.4*  --   --   --  33.6*  --  33.6*  PLT 275  --   --   --  281  --  296  APTT  --    < > 38*  --  181* 82* 83*  HEPARINUNFRC  --   --  >2.20*  --  >2.20*  --  0.71*  CREATININE 1.18*  --   --   --  1.01*  --  1.09*  TROPONINI 0.07*  --  0.06* 0.04*  --   --   --    < > = values in this interval not displayed.    Estimated Creatinine Clearance: 51.8 mL/min (A) (by C-G formula based on SCr of 1.09 mg/dL (H)).   Medical History: Past Medical History:  Diagnosis Date  . Anxiety   . Asthma   . Carotid artery occlusion   . Chronic bronchitis ([place])   . Chronic diastolic heart failure ([place])   . CKD (chronic kidney disease) stage 2, GFR 60-89 ml/min   . COPD (chronic obstructive pulmonary disease) ([place])   . Coronary atherosclerosis of native coronary artery    Nonobstructive 08/2008  . Daily headache   . DJD (degenerative joint  disease)   . Essential hypertension   . GERD (gastroesophageal reflux disease)   . Hyperlipidemia   . Obesity   . On home oxygen therapy   . PAD (peripheral artery disease) (HCC)    Moderate right renal artery stenosis 08/2008  . Paroxysmal atrial fibrillation (HCC)   . Sick sinus syndrome (HCC)    Medtronic dual-chamber his bundle pacemaker - Dr. Rayann Heman  . Type 2 diabetes mellitus (HCC)     Medications:  Medications Prior to Admission  Medication Sig Dispense Refill Last Dose  . acetaminophen (TYLENOL) 325 MG tablet Take 325 mg by mouth every 6 (six) hours as needed for mild pain or headache.   Past Week at Unknown time  . albuterol (PROAIR HFA) 108 (90 Base) MCG/ACT inhaler Inhale 2 puffs into the lungs every 6 (six) hours as needed for wheezing or shortness of breath.    Past Week at Unknown time  . atorvastatin (LIPITOR) 40 MG tablet Take 40 mg by mouth daily.   06/17/2018 at Unknown time  . benazepril (LOTENSIN) 40 MG tablet Take 40 mg by mouth daily.   06/17/2018 at Unknown time  .  dicyclomine (BENTYL) 10 MG capsule Take 10 mg by mouth 4 (four) times daily -  before meals and at bedtime.   06/17/2018 at Unknown time  . ELIQUIS 5 MG TABS tablet TAKE 1 TABLET BY MOUTH TWICE A DAY 60 tablet 9 06/17/2018 at Unknown time  . flecainide (TAMBOCOR) 100 MG tablet Take 1 tablet (100 mg total) by mouth 2 (two) times daily. 60 tablet 6 06/17/2018 at Unknown time  . furosemide (LASIX) 40 MG tablet take 1 tablet by mouth once daily (Patient taking differently: TAKE 1 TABLET (40 MG) BY MOUTH DAILY IN THE MORNING, MAY TAKE AN ADDITIONAL DOSE IN THE AFTERNOON IF NEEDED FOR FLUID RETENTION.) 15 tablet 0 06/17/2018 at Unknown time  . glipiZIDE (GLUCOTROL XL) 10 MG 24 hr tablet Take 10 mg by mouth 2 (two) times daily.    06/17/2018 at Unknown time  . insulin aspart protamine- aspart (NOVOLOG 70/30) (70-30) 100 UNIT/ML injection Inject 44-56 Units into the skin See admin instructions. Inject 56 units in  the morning & 44 units in the evening.   06/17/2018 at Unknown time  . meclizine (ANTIVERT) 25 MG tablet Take 12.5 mg by mouth 2 (two) times daily as needed for dizziness.   06/17/2018 at Unknown time  . metFORMIN (GLUCOPHAGE) 500 MG tablet Take 500 mg by mouth 2 (two) times daily.    06/17/2018 at Unknown time  . metoprolol succinate (TOPROL XL) 25 MG 24 hr tablet Take 0.5 tablets (12.5 mg total) by mouth daily. 45 tablet 2 06/17/2018 at 1300  . Multiple Vitamin (MULTIVITAMIN WITH MINERALS) TABS tablet Take 1 tablet by mouth daily. Centrum Silver   06/17/2018 at Unknown time  . olopatadine (PATANOL) 0.1 % ophthalmic solution Place 1 drop into both eyes 2 (two) times daily.    Past Week at Unknown time  . pantoprazole (PROTONIX) 40 MG tablet TAKE 1 TABLET BY MOUTH TWICE DAILY BEFORE MEALS 60 tablet 5 06/17/2018 at Unknown time  . potassium chloride SA (K-DUR,KLOR-CON) 20 MEQ tablet Take 20 mEq by mouth daily.   06/17/2018 at Unknown time  . traMADol (ULTRAM) 50 MG tablet Take 1 tablet (50 mg total) by mouth every 6 (six) hours as needed. 25 tablet 0 Past Month at Unknown time  . venlafaxine XR (EFFEXOR-XR) 150 MG 24 hr capsule Take 150 mg by mouth daily.   06/17/2018 at Unknown time  . GAVILYTE-G 236 g solution Take 4,000 mLs by mouth once.  0 Taking    Assessment: Pharmacy consulted to dose heparin in patient with ACS/STEMI.  Patient's troponin on admission is 0.07.  Patient is on eliquis prior to admission with last dose taken at 1300 on 12/14.  APTT is therapeutic. Heparin level still 0.71 and effects of apixaban almost gone. Monitor levels based on heparin level starting 12/17.  Goal of Therapy:  Heparin level 0.3-0.7 units/ml aPTT 66-102 sec seconds Monitor platelets by anticoagulation protocol: Yes   Plan:  Continue heparin infusion at 900 units/hr Check anti-Xa level daily while on heparin Continue to monitor H&H and platelets.  Margot Ables, PharmD Clinical Pharmacist 06/19/2018  8:05 AM

## 2018-06-20 ENCOUNTER — Encounter (HOSPITAL_COMMUNITY): Payer: Self-pay | Admitting: Cardiology

## 2018-06-20 ENCOUNTER — Encounter (HOSPITAL_COMMUNITY)
Admission: EM | Disposition: A | Discharge status: Home / Self Care | Payer: Self-pay | Source: Home / Self Care | Attending: Family Medicine

## 2018-06-20 ENCOUNTER — Telehealth: Payer: Self-pay

## 2018-06-20 ENCOUNTER — Ambulatory Visit (HOSPITAL_COMMUNITY): Admit: 2018-06-20 | Payer: Medicare HMO | Admitting: Cardiovascular Disease

## 2018-06-20 DIAGNOSIS — I25110 Atherosclerotic heart disease of native coronary artery with unstable angina pectoris: Principal | ICD-10-CM

## 2018-06-20 DIAGNOSIS — R0609 Other forms of dyspnea: Secondary | ICD-10-CM

## 2018-06-20 DIAGNOSIS — I4819 Other persistent atrial fibrillation: Secondary | ICD-10-CM

## 2018-06-20 HISTORY — PX: LEFT HEART CATH AND CORONARY ANGIOGRAPHY: CATH118249

## 2018-06-20 HISTORY — PX: CARDIAC CATHETERIZATION: SHX172

## 2018-06-20 LAB — GLUCOSE, CAPILLARY
Glucose-Capillary: 85 mg/dL (ref 70–99)
Glucose-Capillary: 89 mg/dL (ref 70–99)
Glucose-Capillary: 158 mg/dL — ABNORMAL HIGH (ref 70–99)
Glucose-Capillary: 228 mg/dL — ABNORMAL HIGH (ref 70–99)

## 2018-06-20 SURGERY — LEFT HEART CATH AND CORONARY ANGIOGRAPHY
Anesthesia: LOCAL

## 2018-06-20 MED ORDER — ACETAMINOPHEN 325 MG PO TABS: 650.0000 mg | ORAL_TABLET | ORAL | Status: DC | PRN

## 2018-06-20 MED ORDER — HEPARIN (PORCINE) IN NACL 1000-0.9 UT/500ML-% IV SOLN
INTRAVENOUS | Status: DC | PRN
  Administered 2018-06-20 (×2): 500 mL

## 2018-06-20 MED ORDER — IOHEXOL 350 MG/ML SOLN
INTRAVENOUS | Status: DC | PRN
  Administered 2018-06-20: 40 mL via INTRA_ARTERIAL

## 2018-06-20 MED ORDER — FENTANYL CITRATE (PF) 100 MCG/2ML IJ SOLN
INTRAMUSCULAR | Status: DC | PRN
  Administered 2018-06-20: 25 ug via INTRAVENOUS

## 2018-06-20 MED ORDER — LIDOCAINE HCL (PF) 1 % IJ SOLN
INTRAMUSCULAR | Status: AC
  Filled 2018-06-20: qty 30

## 2018-06-20 MED ORDER — MIDAZOLAM HCL 2 MG/2ML IJ SOLN
INTRAMUSCULAR | Status: AC
  Filled 2018-06-20: qty 2

## 2018-06-20 MED ORDER — LIDOCAINE HCL (PF) 1 % IJ SOLN
INTRAMUSCULAR | Status: DC | PRN
  Administered 2018-06-20: 2 mL

## 2018-06-20 MED ORDER — ONDANSETRON HCL 4 MG/2ML IJ SOLN: 4.0000 mg | Freq: Four times a day (QID) | INTRAMUSCULAR | Status: DC | PRN

## 2018-06-20 MED ORDER — APIXABAN 5 MG PO TABS
5.0000 mg | ORAL_TABLET | Freq: Two times a day (BID) | ORAL | Status: DC
  Administered 2018-06-20 – 2018-06-21 (×2): 5 mg via ORAL
  Filled 2018-06-20 (×2): qty 1

## 2018-06-20 MED ORDER — MIDAZOLAM HCL 2 MG/2ML IJ SOLN
INTRAMUSCULAR | Status: DC | PRN
  Administered 2018-06-20: 1 mg via INTRAVENOUS

## 2018-06-20 MED ORDER — FENTANYL CITRATE (PF) 100 MCG/2ML IJ SOLN
INTRAMUSCULAR | Status: AC
  Filled 2018-06-20: qty 2

## 2018-06-20 MED ORDER — SODIUM CHLORIDE 0.9 % IV SOLN: 250.0000 mL | INTRAVENOUS | Status: DC | PRN

## 2018-06-20 MED ORDER — HEPARIN (PORCINE) IN NACL 1000-0.9 UT/500ML-% IV SOLN
INTRAVENOUS | Status: AC
  Filled 2018-06-20: qty 1000

## 2018-06-20 MED ORDER — VERAPAMIL HCL 2.5 MG/ML IV SOLN
INTRAVENOUS | Status: AC
  Filled 2018-06-20: qty 2

## 2018-06-20 MED ORDER — SODIUM CHLORIDE 0.9 % IV SOLN: INTRAVENOUS | Status: AC

## 2018-06-20 MED ORDER — HEPARIN SODIUM (PORCINE) 1000 UNIT/ML IJ SOLN
INTRAMUSCULAR | Status: AC
  Filled 2018-06-20: qty 1

## 2018-06-20 MED ORDER — SODIUM CHLORIDE 0.9% FLUSH: 3.0000 mL | INTRAVENOUS | Status: DC | PRN

## 2018-06-20 MED ORDER — SODIUM CHLORIDE 0.9% FLUSH
3.0000 mL | Freq: Two times a day (BID) | INTRAVENOUS | Status: DC
  Administered 2018-06-21: 3 mL via INTRAVENOUS

## 2018-06-20 MED ORDER — HEPARIN SODIUM (PORCINE) 1000 UNIT/ML IJ SOLN
INTRAMUSCULAR | Status: DC | PRN
  Administered 2018-06-20: 5000 [IU] via INTRAVENOUS

## 2018-06-20 MED ORDER — VERAPAMIL HCL 2.5 MG/ML IV SOLN
INTRAVENOUS | Status: DC | PRN
  Administered 2018-06-20: 10 mL via INTRA_ARTERIAL

## 2018-06-20 SURGICAL SUPPLY — 9 items
CATH OPTITORQUE TIG 4.0 5F (CATHETERS) ×2 IMPLANT
DEVICE RAD COMP TR BAND LRG (VASCULAR PRODUCTS) ×2 IMPLANT
GLIDESHEATH SLEND SS 6F .021 (SHEATH) ×2 IMPLANT
GUIDEWIRE INQWIRE 1.5J.035X260 (WIRE) ×1 IMPLANT
INQWIRE 1.5J .035X260CM (WIRE) ×2
KIT HEART LEFT (KITS) ×2 IMPLANT
PACK CARDIAC CATHETERIZATION (CUSTOM PROCEDURE TRAY) ×2 IMPLANT
TRANSDUCER W/STOPCOCK (MISCELLANEOUS) ×2 IMPLANT
TUBING CIL FLEX 10 FLL-RA (TUBING) ×2 IMPLANT

## 2018-06-20 NOTE — Progress Notes (Signed)
Progress Note  Patient Name: Miranda Sexton Date of Encounter: 06/20/2018  Primary Cardiologist: Rozann Lesches, MD   Subjective   Ms. Had her heart cath this am and is doing well.  She is still having some mild chest burning/tightness that is intermittent.  Her discomfort is the same character as what she had last week but not nearly to the intensity.  No shortness of breath or lightheadedness.  Inpatient Medications    Scheduled Meds: . albuterol  2.5 mg Nebulization Once  . apixaban  5 mg Oral BID  . aspirin EC  81 mg Oral Daily  . atorvastatin  40 mg Oral Daily  . benazepril  40 mg Oral Daily  . flecainide  100 mg Oral BID  . furosemide  40 mg Oral Daily  . insulin aspart protamine- aspart  15 Units Subcutaneous BID WC  . metoprolol succinate  12.5 mg Oral Daily  . pantoprazole  40 mg Oral BID AC  . potassium chloride SA  20 mEq Oral Daily  . sodium chloride flush  3 mL Intravenous Q12H  . venlafaxine XR  150 mg Oral Daily   Continuous Infusions: . sodium chloride 100 mL/hr at 06/20/18 1014  . sodium chloride     PRN Meds: sodium chloride, acetaminophen, albuterol, nitroGLYCERIN, ondansetron (ZOFRAN) IV, ondansetron **OR** [DISCONTINUED] ondansetron (ZOFRAN) IV, polyethylene glycol, sodium chloride flush   Vital Signs    Vitals:   06/20/18 0919 06/20/18 0924 06/20/18 0929 06/20/18 1014  BP: 134/76 (!) 141/72  (!) 104/47  Pulse: 60 60 (!) 108 60  Resp: (!) 22 (!) 23 (!) 0   Temp:      TempSrc:      SpO2: 98% 98% (!) 0%   Weight:      Height:        Intake/Output Summary (Last 24 hours) at 06/20/2018 1100 Last data filed at 06/20/2018 1037 Gross per 24 hour  Intake 476.2 ml  Output 200 ml  Net 276.2 ml   Filed Weights   06/17/18 1408 06/19/18 2120 06/20/18 0554  Weight: 104.3 kg 104.3 kg 102.8 kg    Telemetry    AV pacing at 60- Personally Reviewed  ECG    No new tracings- Personally Reviewed  Physical Exam   GEN: No acute distress.     Neck: No JVD Cardiac: RRR, no murmurs, rubs, or gallops.  Respiratory: Clear to auscultation bilaterally. GI: Soft, nontender, non-distended  MS: No edema; No deformity. Neuro:  Nonfocal  Psych: Normal affect   TR band in place to right wrist, no bleeding  Labs    Chemistry Recent Labs  Lab 06/17/18 1431 06/18/18 0724 06/19/18 0542  NA 137 140 142  K 3.4* 4.0 4.6  CL 102 105 103  CO2 27 28 34*  GLUCOSE 205* 90 69*  BUN 19 19 18   CREATININE 1.18* 1.01* 1.09*  CALCIUM 8.7* 8.7* 8.9  GFRNONAA 44* 54* 49*  GFRAA 52* >60 57*  ANIONGAP 8 7 5      Hematology Recent Labs  Lab 06/17/18 1431 06/18/18 0724 06/19/18 0542  WBC 5.1 6.3 6.4  RBC 3.89 3.74* 3.71*  HGB 10.4* 10.0* 9.8*  HCT 34.4* 33.6* 33.6*  MCV 88.4 89.8 90.6  MCH 26.7 26.7 26.4  MCHC 30.2 29.8* 29.2*  RDW 14.8 14.9 14.9  PLT 275 281 296    Cardiac Enzymes Recent Labs  Lab 06/17/18 1431 06/17/18 1604 06/17/18 2157  TROPONINI 0.07* 0.06* 0.04*    Recent Labs  Lab 06/17/18 1436  TROPIPOC 0.03     BNP Recent Labs  Lab 06/17/18 1431  BNP 402.0*     DDimer No results for input(s): DDIMER in the last 168 hours.   Radiology    No results found.  Cardiac Studies    Left heart cath 06/20/2018  Prox LAD lesion is 40% stenosed. Otherwise minimal CAD with a left dominant system.  The left ventricular systolic function is normal.  LV end diastolic pressure is moderately elevated.   SUMMARY:  Angiographically normal coronary arteries with only mild to moderate proximal LAD disease.  Left dominant system.  Normal LVEF with moderately elevated EDP.  RECOMMENDATIONS:  Evaluate for other non-anginal cause for chest pain (if cardiac, could consider coronary spasm versus hypertensive urgency)  Discharge plans per primary team  Okay to restart Eliquis tonight  Glenetta Hew, M.D., M.S.  Echocardiogram 06/18/2018 Study Conclusions  - Left ventricle: The cavity size was normal.  Wall thickness was   normal. Systolic function was vigorous. The estimated ejection   fraction was in the range of 65% to 70%. Wall motion was normal;   there were no regional wall motion abnormalities. Left   ventricular diastolic function parameters were normal. - Ventricular septum: The contour showed diastolic flattening. - Aortic valve: Trileaflet; moderately thickened, moderately   calcified leaflets. Valve mobility was restricted. Valve area   (Vmax): 1.45 cm^2. - Tricuspid valve: There was mild regurgitation. - Pulmonary arteries: Systolic pressure was moderately increased.   PA peak pressure: 55 mm Hg (S).  Patient Profile     77 y.o. female with a hx of nonobstructive CAD, sick sinus syndrome status post pacemaker, atrial fibrillation on flecainide  and Eliquis  Assessment & Plan    Chest pain -Troponins were mildly elevated and flat pattern.  BNP 402 -Echocardiogram on 06/18/2018 revealed normal LV systolic function with EF 65-70%, no regional wall motion abnormalities, normal diastolic function -Left heart cath done today, 06/20/2018, revealed angiographically normal coronary arteries with only mild-moderate proximal LAD disease, 40% stenosis.  Normal LVEF with moderately elevated EDP. -Patient is still having mild intermittent chest burning/tenseness, not nearly to the intensity that it was last week.  -Recommendation is to evaluate for other non-anginal cause of chest pain.  Could consider coronary spasm versus hypertensive urgency if still felt to be cardiac related.  Persistent atrial fibrillation -Rhythm is currently AV pacing at 60 bpm.  We will have Medtronic rep interrogate pacemaker for current rhythm -Continues on Flecainide 100 mg twice daily -Eliquis 5 mg twice daily for stroke risk reduction -The plan was for cardioversion after her cardiac cath was done. Will check rhythm on PPM.   Hypertension -Management at home includes benazepril 40 mg daily, Lasix 40 mg  daily  Medtronic pacemaker in place for sick sinus syndrome -Followed by Dr. Rayann Heman -Interrogation requested.   Hyperlipidemia  -LDL 49 on atorvastatin 40 mg daily, continue current therapy  Diabetes type 2 on insulin -A1c was 7.7 in 10/2017.  Could be a little better controlled, but avoid hypoglycemia.  Anemia -Appears to be chronic.  Hemoglobin 9.8 today, stable.      For questions or updates, please contact [place] Please consult [email or web address] for contact info under        Signed, Daune Perch, NP  06/20/2018, 11:00 AM

## 2018-06-20 NOTE — Progress Notes (Addendum)
PROGRESS NOTE    Miranda Sexton   MVE:[id number]  DOB: 16-Feb-1941  DOA: 06/17/2018 PCP: Glenda Chroman, MD   Brief Narrative:  Miranda Sexton with medical history significantforsick sinus syndrome status post dualchamber pacer,accessible A. fib diastolic CHF, carotid artery disease, who presented to the ED with complaints of left-sided chest pain of 3 days duration, radiatesto both arms,lasting 10 to 15 minutes, aggravated by exertion,relieved by rest.  Chest pain is associated with dizziness shortness of breath,nausea. No vomiting.  She was transferred from [facility] to [facility] for a cardiac cath and possible cardioversion   Subjective: She has no complaints this AM.  ROS: no complaints of nausea, vomiting, constipation diarrhea, cough, dyspnea or dysuria. No other complaints.   Assessment & Plan:   Principal Problem:   Chest pain - s/p cath- only mild to mod CAD - follow for recurrence of pain  Active Problems: A-fib - cont Eliquis - to be assessed by EP for possible cardioversion  HFpEF - cont Lasix daily- euvoloemic  HTN - cont home doses of Lasix, Benazepril, Metoprolol  CKD 2-3 - Cr stable - (0.9-1.1 at baseline)  DM2 - on 70/30 at reduced dose- stop ISS which is not needed when on 70/30 - hold Metformin - follow sugars   SSS - s/p pacer  Asthma - cont PRN Albuterol  DVT prophylaxis: Eliquis Code Status: full code Family Communication:  Disposition Plan: per cardiology Consultants:   Cardiology  Procedures:   Cardiac cath today Antimicrobials:  Anti-infectives (From admission, onward)   None       Objective: Vitals:   06/20/18 1128 06/20/18 1144 06/20/18 1200 06/20/18 1506  BP: (!) 109/57 (!) 102/49 101/85 (!) 110/58  Pulse:    68  Resp:      Temp:    98.9 F (37.2 C)  TempSrc:    Oral  SpO2:    95%  Weight:      Height:        Intake/Output Summary (Last 24 hours) at 06/20/2018 1534 Last data filed at 06/20/2018  1508 Gross per 24 hour  Intake 476.2 ml  Output 400 ml  Net 76.2 ml   Filed Weights   06/17/18 1408 06/19/18 2120 06/20/18 0554  Weight: 104.3 kg 104.3 kg 102.8 kg    Examination: General exam: Appears comfortable  HEENT: PERRLA, oral mucosa moist, no sclera icterus or thrush Respiratory system: Clear to auscultation. Respiratory effort normal. Cardiovascular system: S1 & S2 heard, RRR.   Gastrointestinal system: Abdomen soft, non-tender, nondistended. Normal bowel sounds. Central nervous system: Alert and oriented. No focal neurological deficits. Extremities: No cyanosis, clubbing or edema Skin: No rashes or ulcers Psychiatry:  Mood & affect appropriate.     Data Reviewed: I have personally reviewed following labs and imaging studies  CBC: Recent Labs  Lab 06/17/18 1431 06/18/18 0724 06/19/18 0542  WBC 5.1 6.3 6.4  HGB 10.4* 10.0* 9.8*  HCT 34.4* 33.6* 33.6*  MCV 88.4 89.8 90.6  PLT 275 281 283   Basic Metabolic Panel: Recent Labs  Lab 06/17/18 1431 06/17/18 1620 06/18/18 0724 06/19/18 0542  NA 137  --  140 142  K 3.4*  --  4.0 4.6  CL 102  --  105 103  CO2 27  --  28 34*  GLUCOSE 205*  --  90 69*  BUN 19  --  19 18  CREATININE 1.18*  --  1.01* 1.09*  CALCIUM 8.7*  --  8.7* 8.9  MG  --  1.8  --  1.8   GFR: Estimated Creatinine Clearance: 51.4 mL/min (A) (by C-G formula based on SCr of 1.09 mg/dL (H)). Liver Function Tests: No results for input(s): AST, ALT, ALKPHOS, BILITOT, PROT, ALBUMIN in the last 168 hours. No results for input(s): LIPASE, AMYLASE in the last 168 hours. No results for input(s): AMMONIA in the last 168 hours. Coagulation Profile: No results for input(s): INR, PROTIME in the last 168 hours. Cardiac Enzymes: Recent Labs  Lab 06/17/18 1431 06/17/18 1604 06/17/18 2157  TROPONINI 0.07* 0.06* 0.04*   BNP (last 3 results) No results for input(s): PROBNP in the last 8760 hours. HbA1C: No results for input(s): HGBA1C in the last 72  hours. CBG: Recent Labs  Lab 06/19/18 1626 06/19/18 2000 06/19/18 2120 06/20/18 0738 06/20/18 1136  GLUCAP 144* 132* 100* 85 89   Lipid Profile: Recent Labs    06/18/18 0724  CHOL 97  HDL 36*  LDLCALC 49  TRIG 62  CHOLHDL 2.7   Thyroid Function Tests: No results for input(s): TSH, T4TOTAL, FREET4, T3FREE, THYROIDAB in the last 72 hours. Anemia Panel: No results for input(s): VITAMINB12, FOLATE, FERRITIN, TIBC, IRON, RETICCTPCT in the last 72 hours. Urine analysis:    Component Value Date/Time   COLORURINE YELLOW 01/14/2017 1143   APPEARANCEUR HAZY (A) 01/14/2017 1143   LABSPEC 1.023 01/14/2017 1143   PHURINE 5.0 01/14/2017 1143   GLUCOSEU NEGATIVE 01/14/2017 1143   HGBUR MODERATE (A) 01/14/2017 1143   BILIRUBINUR NEGATIVE 01/14/2017 1143   KETONESUR NEGATIVE 01/14/2017 1143   PROTEINUR 30 (A) 01/14/2017 1143   UROBILINOGEN 0.2 12/05/2012 1431   NITRITE NEGATIVE 01/14/2017 1143   LEUKOCYTESUR SMALL (A) 01/14/2017 1143   Sepsis Labs: @LABRCNTIP (procalcitonin:4,lacticidven:4) )No results found for this or any previous visit (from the past 240 hour(s)).       Radiology Studies: No results found.    Scheduled Meds: . albuterol  2.5 mg Nebulization Once  . apixaban  5 mg Oral BID  . aspirin EC  81 mg Oral Daily  . atorvastatin  40 mg Oral Daily  . benazepril  40 mg Oral Daily  . flecainide  100 mg Oral BID  . furosemide  40 mg Oral Daily  . insulin aspart protamine- aspart  15 Units Subcutaneous BID WC  . metoprolol succinate  12.5 mg Oral Daily  . pantoprazole  40 mg Oral BID AC  . potassium chloride SA  20 mEq Oral Daily  . sodium chloride flush  3 mL Intravenous Q12H  . venlafaxine XR  150 mg Oral Daily   Continuous Infusions: . sodium chloride       LOS: 1 day    Time spent in minutes: 35    Debbe Odea, MD [facility] Pager: [email or web address] Password [email or web address] 06/20/2018, 3:34 PM

## 2018-06-20 NOTE — Telephone Encounter (Signed)
Patient currently admitted at [facility].

## 2018-06-20 NOTE — Telephone Encounter (Signed)
Confirmed remote transmission w/ pt husband.  Pt was admitted to the hospital with chest pains. Pt should be released today or tomorrow.

## 2018-06-20 NOTE — Progress Notes (Signed)
   Pacemaker interrogation revealed that pt currently in sinus rhythm with AV pacing. Her PM did show afib for 12 minutes yest am and 8 minutes this am. Since her last interrogation on 06/09/18 she has had 39.9% AF burden, it had been ~81% prior to that. She had had several days of AF around the 06/09/18 interrogation but she converted to [place].  She does not need cardioversion at this time.   Daune Perch, AGNP-C [facility] 06/20/2018  1:50 PM Pager: [phone]

## 2018-06-20 NOTE — Interval H&P Note (Signed)
History and Physical Interval Note:  06/20/2018 8:51 AM  Miranda Sexton  has presented today for surgery, with the diagnosis of unstable angina.   The various methods of treatment have been discussed with the patient and family. After consideration of risks, benefits and other options for treatment, the patient has consented to  Procedure(s): LEFT HEART CATH AND CORONARY ANGIOGRAPHY (N/A) WITH POSSIBLE PERCUTANEOUS CORONARY INTERVENTION As a surgical intervention .  The patient's history has been reviewed, patient examined, no change in status, stable for surgery.  I have reviewed the patient's chart and labs.  Questions were answered to the patient's satisfaction.     Cath Lab Visit (complete for each Cath Lab visit)  Clinical Evaluation Leading to the Procedure:   ACS: Yes.    Non-ACS:    Anginal Classification: CCS IV  Anti-ischemic medical therapy: Minimal Therapy (1 class of medications)  Non-Invasive Test Results: No non-invasive testing performed  Prior CABG: No previous CABG   Glenetta Hew

## 2018-06-21 ENCOUNTER — Encounter (HOSPITAL_COMMUNITY): Admit: 2018-06-21 | Payer: Medicare HMO

## 2018-06-21 ENCOUNTER — Encounter: Payer: Self-pay | Admitting: Cardiology

## 2018-06-21 DIAGNOSIS — I200 Unstable angina: Secondary | ICD-10-CM

## 2018-06-21 LAB — BASIC METABOLIC PANEL
Anion gap: 9 (ref 5–15)
BUN: 15 mg/dL (ref 8–23)
CO2: 32 mmol/L (ref 22–32)
Calcium: 9 mg/dL (ref 8.9–10.3)
Chloride: 97 mmol/L — ABNORMAL LOW (ref 98–111)
Creatinine, Ser: 1.28 mg/dL — ABNORMAL HIGH (ref 0.44–1.00)
GFR calc Af Amer: 47 mL/min — ABNORMAL LOW (ref >60)
GFR calc non Af Amer: 40 mL/min — ABNORMAL LOW (ref >60)
Glucose, Bld: 107 mg/dL — ABNORMAL HIGH (ref 70–99)
Potassium: 3.9 mmol/L (ref 3.5–5.1)
Sodium: 138 mmol/L (ref 135–145)

## 2018-06-21 LAB — GLUCOSE, CAPILLARY
Glucose-Capillary: 122 mg/dL — ABNORMAL HIGH (ref 70–99)
Glucose-Capillary: 92 mg/dL (ref 70–99)
Glucose-Capillary: 175 mg/dL — ABNORMAL HIGH (ref 70–99)
Glucose-Capillary: 155 mg/dL — ABNORMAL HIGH (ref 70–99)

## 2018-06-21 LAB — CBC
HCT: 32.4 % — ABNORMAL LOW (ref 36.0–46.0)
HEMOGLOBIN: 9.9 g/dL — AB (ref 12.0–15.0)
MCH: 26.3 pg (ref 26.0–34.0)
MCHC: 30.6 g/dL (ref 30.0–36.0)
MCV: 86.2 fL (ref 80.0–100.0)
Platelets: 308 10*3/uL (ref 150–400)
RBC: 3.76 MIL/uL — ABNORMAL LOW (ref 3.87–5.11)
RDW: 14.9 % (ref 11.5–15.5)
WBC: 5.9 10*3/uL (ref 4.0–10.5)
nRBC: 0 % (ref 0.0–0.2)

## 2018-06-21 MED ORDER — NITROGLYCERIN 0.4 MG SL SUBL: 0.4000 mg | SUBLINGUAL_TABLET | SUBLINGUAL | 2 refills | Status: AC | PRN

## 2018-06-21 MED ORDER — ASPIRIN 81 MG PO TBEC: 81.0000 mg | DELAYED_RELEASE_TABLET | Freq: Every day | ORAL | 0 refills | Status: DC

## 2018-06-21 NOTE — Discharge Summary (Signed)
Physician Discharge Summary  Miranda Sexton WGN:[id number] DOB: [birth year] DOA: 06/17/2018  PCP: Glenda Chroman, MD  Admit date: 06/17/2018 Discharge date: 06/21/2018  Admitted From: Home.  Disposition:  Home  Recommendations for Outpatient Follow-up:  1. Follow up with PCP in 1-2 weeks 2. Please obtain BMP/CBC in one week 3. Please follow up with cardiology as recommended.    Discharge Condition:guarded.  CODE STATUS:full code.  Diet recommendation: Heart Healthy   Brief/Interim Summary: Miranda Sexton with medical history significantforsick sinus syndrome status post dualchamber pacer,accessible A. fib diastolic CHF, carotid artery disease, who presented to the ED with complaints of left-sided chest pain of 3 days duration, radiatesto both arms,lasting 10 to 15 minutes, aggravated by exertion,relieved by rest. Chest pain is associated with dizziness shortness of breath,nausea. No vomiting.  She was transferred from [facility] to [facility] for a cardiac cath and possible cardioversion Underwent cardiac cath showing mild to mod CAD.    Discharge Diagnoses:  Principal Problem:   Unstable angina (HCC) Active Problems:   Essential hypertension, benign   Chronic diastolic heart failure (HCC)   Exertional dyspnea   Sick sinus syndrome ([place])   UNSTABLE ANGINA:  Resolved.  Cardiac cath showing mild to mod CAD, medical management.    Atrial fib:  Rate controlled.  EP recommended medical management at this time.  Resume eliquis, flecainide and metoprolol.     Hypertension.  Well controlled.    Chronic diastolic heart failure.  Stable , resume lasix.    Stage 2 to3  CKD; At baseline,   Type 2 DM; CBG (last 3)  Recent Labs    06/21/18 0308 06/21/18 0732 06/21/18 1156  GLUCAP 122* 92 175*   RESUME home meds.    SSS; s/p pacer, interrogated.   Discharge Instructions  Discharge Instructions    Diet - low sodium heart healthy   Complete by:  As  directed    Discharge instructions   Complete by:  As directed    Please follow up with cardiology as recommended.     Allergies as of 06/21/2018      Reactions   Codeine Nausea And Vomiting   Penicillins Rash, Other (See Comments)   Has patient had a PCN reaction causing immediate rash, facial/tongue/throat swelling, SOB or lightheadedness with hypotension: Yes Has patient had a PCN reaction causing severe rash involving mucus membranes or skin necrosis: No Has patient had a PCN reaction that required hospitalization No Has patient had a PCN reaction occurring within the last 10 years: No If all of the above answers are "NO", then may proceed with Cephalosporin use.      Medication List    STOP taking these medications   GAVILYTE-G 236 g solution Generic drug:  polyethylene glycol   meclizine 25 MG tablet Commonly known as:  ANTIVERT     TAKE these medications   acetaminophen 325 MG tablet Commonly known as:  TYLENOL Take 325 mg by mouth every 6 (six) hours as needed for mild pain or headache.   aspirin 81 MG EC tablet Take 1 tablet (81 mg total) by mouth daily. Start taking on:  June 22, 2018   atorvastatin 40 MG tablet Commonly known as:  LIPITOR Take 40 mg by mouth daily.   benazepril 40 MG tablet Commonly known as:  LOTENSIN Take 40 mg by mouth daily.   dicyclomine 10 MG capsule Commonly known as:  BENTYL Take 10 mg by mouth 4 (four) times daily -  before meals  and at bedtime.   ELIQUIS 5 MG Tabs tablet Generic drug:  apixaban TAKE 1 TABLET BY MOUTH TWICE A DAY   flecainide 100 MG tablet Commonly known as:  TAMBOCOR Take 1 tablet (100 mg total) by mouth 2 (two) times daily.   furosemide 40 MG tablet Commonly known as:  LASIX take 1 tablet by mouth once daily What changed:    how much to take  how to take this  when to take this   glipiZIDE 10 MG 24 hr tablet Commonly known as:  GLUCOTROL XL Take 10 mg by mouth 2 (two) times daily.    insulin aspart protamine- aspart (70-30) 100 UNIT/ML injection Commonly known as:  NOVOLOG MIX 70/30 Inject 44-56 Units into the skin See admin instructions. Inject 56 units in the morning & 44 units in the evening.   metFORMIN 500 MG tablet Commonly known as:  GLUCOPHAGE Take 500 mg by mouth 2 (two) times daily.   metoprolol succinate 25 MG 24 hr tablet Commonly known as:  TOPROL XL Take 0.5 tablets (12.5 mg total) by mouth daily.   multivitamin with minerals Tabs tablet Take 1 tablet by mouth daily. Centrum Silver   nitroGLYCERIN 0.4 MG SL tablet Commonly known as:  NITROSTAT Place 1 tablet (0.4 mg total) under the tongue every 5 (five) minutes as needed for chest pain.   olopatadine 0.1 % ophthalmic solution Commonly known as:  PATANOL Place 1 drop into both eyes 2 (two) times daily.   pantoprazole 40 MG tablet Commonly known as:  PROTONIX TAKE 1 TABLET BY MOUTH TWICE DAILY BEFORE MEALS   potassium chloride SA 20 MEQ tablet Commonly known as:  K-DUR,KLOR-CON Take 20 mEq by mouth daily.   PROAIR HFA 108 (90 Base) MCG/ACT inhaler Generic drug:  albuterol Inhale 2 puffs into the lungs every 6 (six) hours as needed for wheezing or shortness of breath.   traMADol 50 MG tablet Commonly known as:  ULTRAM Take 1 tablet (50 mg total) by mouth every 6 (six) hours as needed.   venlafaxine XR 150 MG 24 hr capsule Commonly known as:  EFFEXOR-XR Take 150 mg by mouth daily.      Follow-up Information    [facility] Follow up.   Specialty:  Cardiology Why:  Hospital follow-up on 07/03/2018 at 9:30 AM.  Please call our office at the above number for parking instructions. Contact information: [address] [id number] Danice Goltz [place], Dhruv B, MD. Schedule an appointment as soon as possible for a visit in 1 week(s).   Specialty:  Internal Medicine Contact information: [place]  [address] [phone]        Satira Sark, MD .   Specialty:  Cardiology Contact information: [place] [address] [phone]        [name], MD .   Specialty:  Cardiology Contact information: [place] [address] [phone]          Allergies  Allergen Reactions  . Codeine Nausea And Vomiting  . Penicillins Rash and Other (See Comments)    Has patient had a PCN reaction causing immediate rash, facial/tongue/throat swelling, SOB or lightheadedness with hypotension: Yes Has patient had a PCN reaction causing severe rash involving mucus membranes or skin necrosis: No Has patient had a PCN reaction that required hospitalization No Has patient had a PCN  reaction occurring within the last 10 years: No If all of the above answers are "NO", then may proceed with Cephalosporin use.     Consultations:  Cardiology.    Procedures/Studies: Dg Chest 2 View  Result Date: 06/17/2018 CLINICAL DATA:  Chest pain for several days EXAM: CHEST - 2 VIEW COMPARISON:  December 31, 2017 FINDINGS: Stable mild cardiomegaly. The hila and mediastinum are stable. The pacemaker stable. Mild atelectasis in the bases. No suspicious infiltrates. No overt edema. IMPRESSION: No active cardiopulmonary disease. Electronically Signed   By: Dorise Bullion III M.D   On: 06/17/2018 14:44       Subjective: No new complaints   Discharge Exam: Vitals:   06/21/18 0427 06/21/18 0816  BP:  92/79  Pulse: 62   Resp:    Temp: 97.9 F (36.6 C)   SpO2: 95%    Vitals:   06/20/18 1506 06/20/18 1956 06/21/18 0427 06/21/18 0816  BP: (!) 110/58 (!) 113/52  92/79  Pulse: 68 60 62   Resp:      Temp: 98.9 F (37.2 C) 98 F (36.7 C) 97.9 F (36.6 C)   TempSrc: Oral Oral Oral   SpO2: 95% 95% 95%   Weight:   103 kg   Height:        General: Pt is alert, awake, not in acute distress Cardiovascular: RRR, S1/S2 +, no rubs, no gallops Respiratory:  CTA bilaterally, no wheezing, no rhonchi Abdominal: Soft, NT, ND, bowel sounds + Extremities: no edema, no cyanosis    The results of significant diagnostics from this hospitalization (including imaging, microbiology, ancillary and laboratory) are listed below for reference.     Microbiology: No results found for this or any previous visit (from the past 240 hour(s)).   Labs: BNP (last 3 results) Recent Labs    06/17/18 1431  BNP 638.4*   Basic Metabolic Panel: Recent Labs  Lab 06/17/18 1431 06/17/18 1620 06/18/18 0724 06/19/18 0542 06/21/18 0307  NA 137  --  140 142 138  K 3.4*  --  4.0 4.6 3.9  CL 102  --  105 103 97*  CO2 27  --  28 34* 32  GLUCOSE 205*  --  90 69* 107*  BUN 19  --  19 18 15   CREATININE 1.18*  --  1.01* 1.09* 1.28*  CALCIUM 8.7*  --  8.7* 8.9 9.0  MG  --  1.8  --  1.8  --    Liver Function Tests: No results for input(s): AST, ALT, ALKPHOS, BILITOT, PROT, ALBUMIN in the last 168 hours. No results for input(s): LIPASE, AMYLASE in the last 168 hours. No results for input(s): AMMONIA in the last 168 hours. CBC: Recent Labs  Lab 06/17/18 1431 06/18/18 0724 06/19/18 0542 06/21/18 0307  WBC 5.1 6.3 6.4 5.9  HGB 10.4* 10.0* 9.8* 9.9*  HCT 34.4* 33.6* 33.6* 32.4*  MCV 88.4 89.8 90.6 86.2  PLT 275 281 296 308   Cardiac Enzymes: Recent Labs  Lab 06/17/18 1431 06/17/18 1604 06/17/18 2157  TROPONINI 0.07* 0.06* 0.04*   BNP: Invalid input(s): POCBNP CBG: Recent Labs  Lab 06/20/18 1606 06/20/18 2054 06/21/18 0308 06/21/18 0732 06/21/18 1156  GLUCAP 158* 228* 122* 92 175*   D-Dimer No results for input(s): DDIMER in the last 72 hours. Hgb A1c No results for input(s): HGBA1C in the last 72 hours. Lipid Profile No results for input(s): CHOL, HDL, LDLCALC, TRIG, CHOLHDL, LDLDIRECT in the last 72 hours. Thyroid function studies No results  for input(s): TSH, T4TOTAL, T3FREE, THYROIDAB in the last 72 hours.  Invalid input(s):  FREET3 Anemia work up No results for input(s): VITAMINB12, FOLATE, FERRITIN, TIBC, IRON, RETICCTPCT in the last 72 hours. Urinalysis    Component Value Date/Time   COLORURINE YELLOW 01/14/2017 1143   APPEARANCEUR HAZY (A) 01/14/2017 1143   LABSPEC 1.023 01/14/2017 1143   PHURINE 5.0 01/14/2017 1143   GLUCOSEU NEGATIVE 01/14/2017 1143   HGBUR MODERATE (A) 01/14/2017 1143   BILIRUBINUR NEGATIVE 01/14/2017 1143   KETONESUR NEGATIVE 01/14/2017 1143   PROTEINUR 30 (A) 01/14/2017 1143   UROBILINOGEN 0.2 12/05/2012 1431   NITRITE NEGATIVE 01/14/2017 1143   LEUKOCYTESUR SMALL (A) 01/14/2017 1143   Sepsis Labs Invalid input(s): PROCALCITONIN,  WBC,  LACTICIDVEN Microbiology No results found for this or any previous visit (from the past 240 hour(s)).   Time coordinating discharge: 36 minutes  SIGNED:   Hosie Poisson, MD  [facility] 06/21/2018, 4:58 PM Pager   If 7PM-7AM, please contact night-coverage [email or web address] Password [email or web address]

## 2018-06-21 NOTE — Discharge Instructions (Signed)
Information on my medicine - ELIQUIS® (apixaban) ° °This medication education was reviewed with me or my healthcare representative as part of my discharge preparation.  You were taking this medication prior to this hospitalization.  ° °Why was Eliquis® prescribed for you? °Eliquis® was prescribed for you to reduce the risk of a blood clot forming that can cause a stroke if you have a medical condition called atrial fibrillation (a type of irregular heartbeat). ° °What do You need to know about Eliquis® ? °Take your Eliquis® TWICE DAILY - one tablet in the morning and one tablet in the evening with or without food. If you have difficulty swallowing the tablet whole please discuss with your pharmacist how to take the medication safely. ° °Take Eliquis® exactly as prescribed by your doctor and DO NOT stop taking Eliquis® without talking to the doctor who prescribed the medication.  Stopping may increase your risk of developing a stroke.  Refill your prescription before you run out. ° °After discharge, you should have regular check-up appointments with your healthcare provider that is prescribing your Eliquis®.  In the future your dose may need to be changed if your kidney function or weight changes by a significant amount or as you get older. ° °What do you do if you miss a dose? °If you miss a dose, take it as soon as you remember on the same day and resume taking twice daily.  Do not take more than one dose of ELIQUIS at the same time to make up a missed dose. ° °Important Safety Information °A possible side effect of Eliquis® is bleeding. You should call your healthcare provider right away if you experience any of the following: °? Bleeding from an injury or your nose that does not stop. °? Unusual colored urine (red or dark brown) or unusual colored stools (red or black). °? Unusual bruising for unknown reasons. °? A serious fall or if you hit your head (even if there is no bleeding). ° °Some medicines may interact  with Eliquis® and might increase your risk of bleeding or clotting while on Eliquis®. To help avoid this, consult your healthcare provider or pharmacist prior to using any new prescription or non-prescription medications, including herbals, vitamins, non-steroidal anti-inflammatory drugs (NSAIDs) and supplements. ° °This website has more information on Eliquis® (apixaban): http://www.eliquis.com/eliquis/home °

## 2018-06-21 NOTE — Progress Notes (Signed)
Patient received discharge information and acknowledged understanding of it. Patient received ulnar site care education. Patient IVs were removed.

## 2018-06-21 NOTE — Progress Notes (Signed)
Progress Note  Patient Name: Miranda Sexton Date of Encounter: 06/21/2018  Primary Cardiologist: Rozann Lesches, MD   Subjective   Miranda Sexton says that she slept very well last night and is feeling much better today.  She says she has not had chest pain in 2 days.  She is feeling back to normal.  Inpatient Medications    Scheduled Meds: . albuterol  2.5 mg Nebulization Once  . apixaban  5 mg Oral BID  . aspirin EC  81 mg Oral Daily  . atorvastatin  40 mg Oral Daily  . benazepril  40 mg Oral Daily  . flecainide  100 mg Oral BID  . furosemide  40 mg Oral Daily  . insulin aspart protamine- aspart  15 Units Subcutaneous BID WC  . metoprolol succinate  12.5 mg Oral Daily  . pantoprazole  40 mg Oral BID AC  . potassium chloride SA  20 mEq Oral Daily  . sodium chloride flush  3 mL Intravenous Q12H  . venlafaxine XR  150 mg Oral Daily   Continuous Infusions: . sodium chloride     PRN Meds: sodium chloride, acetaminophen, albuterol, nitroGLYCERIN, ondansetron (ZOFRAN) IV, ondansetron **OR** [DISCONTINUED] ondansetron (ZOFRAN) IV, polyethylene glycol, sodium chloride flush   Vital Signs    Vitals:   06/20/18 1506 06/20/18 1956 06/21/18 0427 06/21/18 0816  BP: (!) 110/58 (!) 113/52  92/79  Pulse: 68 60 62   Resp:      Temp: 98.9 F (37.2 C) 98 F (36.7 C) 97.9 F (36.6 C)   TempSrc: Oral Oral Oral   SpO2: 95% 95% 95%   Weight:   103 kg   Height:        Intake/Output Summary (Last 24 hours) at 06/21/2018 9163 Last data filed at 06/20/2018 2115 Gross per 24 hour  Intake 758.33 ml  Output 200 ml  Net 558.33 ml   Filed Weights   06/19/18 2120 06/20/18 0554 06/21/18 0427  Weight: 104.3 kg 102.8 kg 103 kg    Telemetry    AV pacing- Personally Reviewed  ECG    No new tracings- Personally Reviewed  Physical Exam   GEN: Obese female. No acute distress.   Neck: No JVD Cardiac: RRR, 2/6 systolic murmur at RUSB Respiratory: Clear to auscultation  bilaterally. GI: Soft, nontender, non-distended  MS: No edema; No deformity. Neuro:  Nonfocal  Psych: Normal affect   Labs    Chemistry Recent Labs  Lab 06/18/18 0724 06/19/18 0542 06/21/18 0307  NA 140 142 138  K 4.0 4.6 3.9  CL 105 103 97*  CO2 28 34* 32  GLUCOSE 90 69* 107*  BUN 19 18 15   CREATININE 1.01* 1.09* 1.28*  CALCIUM 8.7* 8.9 9.0  GFRNONAA 54* 49* 40*  GFRAA >60 57* 47*  ANIONGAP 7 5 9      Hematology Recent Labs  Lab 06/18/18 0724 06/19/18 0542 06/21/18 0307  WBC 6.3 6.4 5.9  RBC 3.74* 3.71* 3.76*  HGB 10.0* 9.8* 9.9*  HCT 33.6* 33.6* 32.4*  MCV 89.8 90.6 86.2  MCH 26.7 26.4 26.3  MCHC 29.8* 29.2* 30.6  RDW 14.9 14.9 14.9  PLT 281 296 308    Cardiac Enzymes Recent Labs  Lab 06/17/18 1431 06/17/18 1604 06/17/18 2157  TROPONINI 0.07* 0.06* 0.04*    Recent Labs  Lab 06/17/18 1436  TROPIPOC 0.03     BNP Recent Labs  Lab 06/17/18 1431  BNP 402.0*     DDimer No results for input(s): DDIMER  in the last 168 hours.   Radiology    No results found.  Cardiac Studies    Left heart cath 06/20/2018  Prox LAD lesion is 40% stenosed. Otherwise minimal CAD with a left dominant system.  The left ventricular systolic function is normal.  LV end diastolic pressure is moderately elevated.  SUMMARY:  Angiographically normal coronary arteries with only mild to moderate proximal LAD disease.  Left dominant system.  Normal LVEF with moderately elevated EDP.  RECOMMENDATIONS:  Evaluate for other non-anginal cause for chest pain (if cardiac, could consider coronary spasm versus hypertensive urgency)  Discharge plans per primary team  Okay to restart Eliquis tonight  Glenetta Hew, M.D., M.S.  Echocardiogram 06/18/2018 Study Conclusions  - Left ventricle: The cavity size was normal. Wall thickness was normal. Systolic function was vigorous. The estimated ejection fraction was in the range of 65% to 70%. Wall motion was  normal; there were no regional wall motion abnormalities. Left ventricular diastolic function parameters were normal. - Ventricular septum: The contour showed diastolic flattening. - Aortic valve: Trileaflet; moderately thickened, moderately calcified leaflets. Valve mobility was restricted. Valve area (Vmax): 1.45 cm^2. - Tricuspid valve: There was mild regurgitation. - Pulmonary arteries: Systolic pressure was moderately increased. PA peak pressure: 55 mm Hg (S).   Patient Profile     77 y.o. female  with a hx of nonobstructive CAD, sick sinus syndrome status post pacemaker, atrial fibrillation on flecainide and Eliquis  Assessment & Plan    Chest pain -Echo with normal LV function and cath showing only moderate nonobstructive disease.  -It is unclear what is causing the patient's symptoms, no cardiac cause has been identified -Per Dr. Judeth Cornfield recommendations yesterday, order placed for orthostatic vital signs and to have patient walk in the hall with blood pressure cuff to check vital signs if she has any symptoms. -If no significant symptoms with walking, the patient has likely ready for discharge today.  Persistent atrial fibrillation -Rhythm is currently AV paced -Pacemaker interrogation yesterday revealed that the patient is predominantly in sinus rhythm with AV pacing.  She has had a few brief episodes of A. fib in the last few days.  A. fib burden since 06/09/2018 has been 39.9%.  No indication for cardioversion since maintaining sinus rhythm. -Continue flecainide for rhythm control. -She is on Eliquis 5 mg twice daily for stroke risk reduction. -She has an appointment for 07/03/2018 at the A. fib clinic.  Hypertension -Blood pressure well controlled, a little low this morning at 92/79. -We will check orthostatic vital signs  [employer] pacemaker in place for sick sinus syndrome -Followed by Dr. Rayann Heman     For questions or updates, please contact [facility] Please consult [email or web address] for contact info under        Signed, Daune Perch, NP  06/21/2018, 8:32 AM

## 2018-06-21 NOTE — Progress Notes (Signed)
Patient ambulated 28ft in hall with RN. Patient denied any dizziness, lightheadedness, or discomfort. Patient did have some SOB and O2 sats did drop to 80s on RA. Placed patient on 2L O2 and O2 sats went up to 93%. Patient wears oxygen at home. BP when walk was finished was 126/75 and pulse was 60.

## 2018-06-21 NOTE — Telephone Encounter (Signed)
Per hospital documentation - may be going home soon.  Had heart cath while there.  No need currently for DCCV per chart.    Persistent atrial fibrillation -Rhythm is currently AV paced -Pacemaker interrogation yesterday revealed that the patient is predominantly in sinus rhythm with AV pacing.  She has had a few brief episodes of A. fib in the last few days.  A. fib burden since 06/09/2018 has been 39.9%.  No indication for cardioversion since maintaining sinus rhythm. -Continue flecainide for rhythm control. -She is on Eliquis 5 mg twice daily for stroke risk reduction. -She has an appointment for 07/03/2018 at the A. fib clinic.

## 2018-06-23 LAB — CUP PACEART INCLINIC DEVICE CHECK
Date Time Interrogation Session: 20191220135527
Implantable Lead Implant Date: 20170829
Implantable Lead Implant Date: 20170829
Implantable Lead Location: 753859
Implantable Lead Location: 753860
Implantable Lead Model: 3830
Implantable Lead Model: 5076
MDC IDC PG IMPLANT DT: 20170829

## 2018-06-26 LAB — GLUCOSE, CAPILLARY
Glucose-Capillary: 235 mg/dL — ABNORMAL HIGH (ref 70–99)
Glucose-Capillary: 118 mg/dL — ABNORMAL HIGH (ref 70–99)
Glucose-Capillary: 141 mg/dL — ABNORMAL HIGH (ref 70–99)
Glucose-Capillary: 74 mg/dL (ref 70–99)
Glucose-Capillary: 61 mg/dL — ABNORMAL LOW (ref 70–99)

## 2018-06-27 ENCOUNTER — Encounter (HOSPITAL_COMMUNITY): Admission: RE | Payer: Self-pay | Source: Home / Self Care

## 2018-06-27 ENCOUNTER — Ambulatory Visit (HOSPITAL_COMMUNITY): Admission: RE | Admit: 2018-06-27 | Payer: Medicare HMO | Source: Home / Self Care | Admitting: Cardiology

## 2018-06-27 SURGERY — CARDIOVERSION
Anesthesia: Monitor Anesthesia Care

## 2018-06-29 DIAGNOSIS — R079 Chest pain, unspecified: Secondary | ICD-10-CM | POA: Diagnosis not present

## 2018-06-29 DIAGNOSIS — J449 Chronic obstructive pulmonary disease, unspecified: Secondary | ICD-10-CM | POA: Diagnosis not present

## 2018-06-29 DIAGNOSIS — I4891 Unspecified atrial fibrillation: Secondary | ICD-10-CM | POA: Diagnosis not present

## 2018-06-29 DIAGNOSIS — I10 Essential (primary) hypertension: Secondary | ICD-10-CM | POA: Diagnosis not present

## 2018-06-29 DIAGNOSIS — Z6838 Body mass index (BMI) 38.0-38.9, adult: Secondary | ICD-10-CM | POA: Diagnosis not present

## 2018-06-29 DIAGNOSIS — I4892 Unspecified atrial flutter: Secondary | ICD-10-CM | POA: Diagnosis not present

## 2018-06-29 DIAGNOSIS — Z299 Encounter for prophylactic measures, unspecified: Secondary | ICD-10-CM | POA: Diagnosis not present

## 2018-07-03 ENCOUNTER — Encounter (HOSPITAL_COMMUNITY): Payer: Self-pay | Admitting: Nurse Practitioner

## 2018-07-03 ENCOUNTER — Ambulatory Visit (HOSPITAL_COMMUNITY)
Admission: RE | Admit: 2018-07-03 | Discharge: 2018-07-03 | Disposition: A | Discharge status: Home / Self Care | Payer: Medicare HMO | Source: Ambulatory Visit | Attending: Nurse Practitioner | Admitting: Nurse Practitioner

## 2018-07-03 VITALS — BP 132/72 | HR 65 | Ht 64.0 in | Wt 229.0 lb

## 2018-07-03 DIAGNOSIS — I495 Sick sinus syndrome: Secondary | ICD-10-CM | POA: Diagnosis not present

## 2018-07-03 DIAGNOSIS — J449 Chronic obstructive pulmonary disease, unspecified: Secondary | ICD-10-CM | POA: Insufficient documentation

## 2018-07-03 DIAGNOSIS — Z7901 Long term (current) use of anticoagulants: Secondary | ICD-10-CM | POA: Insufficient documentation

## 2018-07-03 DIAGNOSIS — Z833 Family history of diabetes mellitus: Secondary | ICD-10-CM | POA: Diagnosis not present

## 2018-07-03 DIAGNOSIS — Z880 Allergy status to penicillin: Secondary | ICD-10-CM | POA: Insufficient documentation

## 2018-07-03 DIAGNOSIS — I5032 Chronic diastolic (congestive) heart failure: Secondary | ICD-10-CM | POA: Diagnosis not present

## 2018-07-03 DIAGNOSIS — Z8249 Family history of ischemic heart disease and other diseases of the circulatory system: Secondary | ICD-10-CM | POA: Diagnosis not present

## 2018-07-03 DIAGNOSIS — Z800 Family history of malignant neoplasm of digestive organs: Secondary | ICD-10-CM | POA: Insufficient documentation

## 2018-07-03 DIAGNOSIS — I480 Paroxysmal atrial fibrillation: Secondary | ICD-10-CM | POA: Insufficient documentation

## 2018-07-03 DIAGNOSIS — N182 Chronic kidney disease, stage 2 (mild): Secondary | ICD-10-CM | POA: Insufficient documentation

## 2018-07-03 DIAGNOSIS — E1122 Type 2 diabetes mellitus with diabetic chronic kidney disease: Secondary | ICD-10-CM | POA: Insufficient documentation

## 2018-07-03 DIAGNOSIS — Z7982 Long term (current) use of aspirin: Secondary | ICD-10-CM | POA: Insufficient documentation

## 2018-07-03 DIAGNOSIS — Z950 Presence of cardiac pacemaker: Secondary | ICD-10-CM | POA: Insufficient documentation

## 2018-07-03 DIAGNOSIS — I2510 Atherosclerotic heart disease of native coronary artery without angina pectoris: Secondary | ICD-10-CM | POA: Insufficient documentation

## 2018-07-03 DIAGNOSIS — I130 Hypertensive heart and chronic kidney disease with heart failure and stage 1 through stage 4 chronic kidney disease, or unspecified chronic kidney disease: Secondary | ICD-10-CM | POA: Diagnosis not present

## 2018-07-03 DIAGNOSIS — K219 Gastro-esophageal reflux disease without esophagitis: Secondary | ICD-10-CM | POA: Insufficient documentation

## 2018-07-03 DIAGNOSIS — Z79899 Other long term (current) drug therapy: Secondary | ICD-10-CM | POA: Insufficient documentation

## 2018-07-03 DIAGNOSIS — E785 Hyperlipidemia, unspecified: Secondary | ICD-10-CM | POA: Insufficient documentation

## 2018-07-03 DIAGNOSIS — R079 Chest pain, unspecified: Secondary | ICD-10-CM | POA: Diagnosis not present

## 2018-07-03 DIAGNOSIS — I4891 Unspecified atrial fibrillation: Secondary | ICD-10-CM | POA: Diagnosis present

## 2018-07-03 DIAGNOSIS — Z885 Allergy status to narcotic agent status: Secondary | ICD-10-CM | POA: Insufficient documentation

## 2018-07-03 DIAGNOSIS — Z794 Long term (current) use of insulin: Secondary | ICD-10-CM | POA: Insufficient documentation

## 2018-07-03 DIAGNOSIS — Z9981 Dependence on supplemental oxygen: Secondary | ICD-10-CM | POA: Diagnosis not present

## 2018-07-03 NOTE — Progress Notes (Signed)
Primary Care Physician: Glenda Chroman, MD Referring Physician: Dr. Novella Olive is a 77 y.o. female with a h/o SSS s/p PPM. Afib was recently found on interrogation. Pt was seen by Dr.Allred in [place], started on flecainde and scheduled for a  cardioversion. She had an admission 12/14 for chest  pain. She was in SR and  cardioversion was cancelled.  Heart cath was done and showed mild to moderate CAD.She is now in the afib clinic. Chest pain  resolved. Brief interrogation today shows  SR. She did have 55 mins of afib noted on 12/23.  Today, she denies symptoms of palpitations, chest pain, shortness of breath, orthopnea, PND, lower extremity edema, dizziness, presyncope, syncope, or neurologic sequela. The patient is tolerating medications without difficulties and is otherwise without complaint today.   Past Medical History:  Diagnosis Date  . Anxiety   . Asthma   . Carotid artery occlusion   . Chronic bronchitis ([place])   . Chronic diastolic heart failure ([place])   . CKD (chronic kidney disease) stage 2, GFR 60-89 ml/min   . COPD (chronic obstructive pulmonary disease) ([place])   . Coronary atherosclerosis of native coronary artery    Nonobstructive 08/2008  . Daily headache   . DJD (degenerative joint disease)   . Essential hypertension   . GERD (gastroesophageal reflux disease)   . Hyperlipidemia   . Obesity   . On home oxygen therapy   . PAD (peripheral artery disease) (HCC)    Moderate right renal artery stenosis 08/2008  . Paroxysmal atrial fibrillation (HCC)   . Sick sinus syndrome (HCC)    Medtronic dual-chamber his bundle pacemaker - Dr. Rayann Heman  . Type 2 diabetes mellitus ([place])    Past Surgical History:  Procedure Laterality Date  . BIOPSY  08/03/2017   Procedure: BIOPSY;  Surgeon: [name], MD;  [facility];  Service: Endoscopy;;  gastric   . CARDIAC CATHETERIZATION  2014  . CATARACT EXTRACTION W/ INTRAOCULAR LENS  IMPLANT, BILATERAL Bilateral  2008  . COLONOSCOPY N/A 08/03/2017   Procedure: COLONOSCOPY;  Surgeon: [name], MD;  [facility];  Service: Endoscopy;  Laterality: N/A;  . ENDARTERECTOMY Right 12/08/2012   Procedure: ENDARTERECTOMY CAROTID;  Surgeon: [name], MD;  Location: [facility];  Service: Vascular;  Laterality: Right;  . EP IMPLANTABLE DEVICE N/A 03/02/2016   Procedure: Pacemaker Implant;  Surgeon: [name], MD;  Location: [facility];  Service: Cardiovascular;  Laterality: N/A;  . ESOPHAGOGASTRODUODENOSCOPY N/A 08/03/2017   Procedure: ESOPHAGOGASTRODUODENOSCOPY (EGD);  Surgeon: [name], MD;  [facility];  Service: Endoscopy;  Laterality: N/A;  . INSERT / REPLACE / REMOVE PACEMAKER  03/02/2016  . LEFT HEART CATH AND CORONARY ANGIOGRAPHY N/A 06/20/2018   Procedure: LEFT HEART CATH AND CORONARY ANGIOGRAPHY;  Surgeon: [name], MD;  Location: [facility];  Service: Cardiovascular;  Laterality: N/A;  . PARTIAL COLECTOMY N/A 10/19/2017   Procedure: PARTIAL COLECTOMY;  Surgeon: [name], MD;  [facility];  Service: General;  Laterality: N/A;  . POLYPECTOMY  08/03/2017   Procedure: POLYPECTOMY;  Surgeon: [name], MD;  [facility];  Service: Endoscopy;;  colon    Current Outpatient Medications  Medication Sig Dispense Refill  . acetaminophen (TYLENOL) 325 MG tablet Take 325 mg by mouth every 6 (six) hours as needed for mild pain or headache.    . albuterol (PROAIR HFA) 108 (  90 Base) MCG/ACT inhaler Inhale 2 puffs into the lungs every 6 (six) hours as needed for wheezing or shortness of breath.     Marland Kitchen aspirin EC 81 MG EC tablet Take 1 tablet (81 mg total) by mouth daily. 30 tablet 0  . atorvastatin (LIPITOR) 40 MG tablet Take 40 mg by mouth daily.    . benazepril (LOTENSIN) 40 MG tablet Take 40 mg by mouth daily.    Marland Kitchen dicyclomine (BENTYL) 10 MG capsule Take 10 mg by mouth 4 (four) times daily -  before meals and at bedtime.    Marland Kitchen  ELIQUIS 5 MG TABS tablet TAKE 1 TABLET BY MOUTH TWICE A DAY 60 tablet 9  . flecainide (TAMBOCOR) 100 MG tablet Take 1 tablet (100 mg total) by mouth 2 (two) times daily. 60 tablet 6  . furosemide (LASIX) 40 MG tablet take 1 tablet by mouth once daily (Patient taking differently: TAKE 1 TABLET (40 MG) BY MOUTH DAILY IN THE MORNING, MAY TAKE AN ADDITIONAL DOSE IN THE AFTERNOON IF NEEDED FOR FLUID RETENTION.) 15 tablet 0  . glipiZIDE (GLUCOTROL XL) 10 MG 24 hr tablet Take 10 mg by mouth 2 (two) times daily.     . insulin aspart protamine- aspart (NOVOLOG 70/30) (70-30) 100 UNIT/ML injection Inject 44-56 Units into the skin See admin instructions. Inject 56 units in the morning & 44 units in the evening.    . metFORMIN (GLUCOPHAGE) 500 MG tablet Take 500 mg by mouth 2 (two) times daily.     . metoprolol succinate (TOPROL XL) 25 MG 24 hr tablet Take 0.5 tablets (12.5 mg total) by mouth daily. 45 tablet 2  . Multiple Vitamin (MULTIVITAMIN WITH MINERALS) TABS tablet Take 1 tablet by mouth daily. Centrum Silver    . nitroGLYCERIN (NITROSTAT) 0.4 MG SL tablet Place 1 tablet (0.4 mg total) under the tongue every 5 (five) minutes as needed for chest pain. 30 tablet 2  . olopatadine (PATANOL) 0.1 % ophthalmic solution Place 1 drop into both eyes 2 (two) times daily.     . pantoprazole (PROTONIX) 40 MG tablet TAKE 1 TABLET BY MOUTH TWICE DAILY BEFORE MEALS 60 tablet 5  . potassium chloride SA (K-DUR,KLOR-CON) 20 MEQ tablet Take 20 mEq by mouth daily.    . traMADol (ULTRAM) 50 MG tablet Take 1 tablet (50 mg total) by mouth every 6 (six) hours as needed. 25 tablet 0  . venlafaxine XR (EFFEXOR-XR) 150 MG 24 hr capsule Take 150 mg by mouth daily.     No current facility-administered medications for this encounter.     Allergies  Allergen Reactions  . Codeine Nausea And Vomiting  . Penicillins Rash and Other (See Comments)    Has patient had a PCN reaction causing immediate rash, facial/tongue/throat swelling,  SOB or lightheadedness with hypotension: Yes Has patient had a PCN reaction causing severe rash involving mucus membranes or skin necrosis: No Has patient had a PCN reaction that required hospitalization No Has patient had a PCN reaction occurring within the last 10 years: No If all of the above answers are "NO", then may proceed with Cephalosporin use.     Social History   Socioeconomic History  . Marital status: Married    Spouse name: Not on file  . Number of children: Not on file  . Years of education: Not on file  . Highest education level: Not on file  Occupational History  . Not on file  Social Needs  . Financial resource strain: Not  on file  . Food insecurity:    Worry: Not on file    Inability: Not on file  . Transportation needs:    Medical: Not on file    Non-medical: Not on file  Tobacco Use  . Smoking status: Never Smoker  . Smokeless tobacco: Never Used  Substance and Sexual Activity  . Alcohol use: No    Alcohol/week: 0.0 standard drinks  . Drug use: No  . Sexual activity: Never    Birth control/protection: None  Lifestyle  . Physical activity:    Days per week: Not on file    Minutes per session: Not on file  . Stress: Not on file  Relationships  . Social connections:    Talks on phone: Not on file    Gets together: Not on file    Attends religious service: Not on file    Active member of club or organization: Not on file    Attends meetings of clubs or organizations: Not on file    Relationship status: Not on file  . Intimate partner violence:    Fear of current or ex partner: Not on file    Emotionally abused: Not on file    Physically abused: Not on file    Forced sexual activity: Not on file  Other Topics Concern  . Not on file  Social History Narrative   Lives in [place] with spouse.  4 grown children.   Retired but continues to clean houses    Family History  Problem Relation Age of Onset  . Cancer Mother        Stomach  . Deep vein  thrombosis Mother   . Diabetes Mother   . Hypertension Mother   . Heart disease Mother   . Hypertension Father   . Diabetes Sister   . Hyperlipidemia Sister   . Hypertension Sister   . Heart disease Sister   . Diabetes Brother   . Hyperlipidemia Brother   . Hypertension Brother     ROS- All systems are reviewed and negative except as per the HPI above  Physical Exam: Vitals:   07/03/18 0922  BP: 132/72  Pulse: 65  Weight: 103.9 kg  Height: 5\' 4"  (1.626 m)   Wt Readings from Last 3 Encounters:  07/03/18 103.9 kg  06/21/18 103 kg  06/09/18 104.9 kg    Labs: Lab Results  Component Value Date   NA 138 06/21/2018   K 3.9 06/21/2018   CL 97 (L) 06/21/2018   CO2 32 06/21/2018   GLUCOSE 107 (H) 06/21/2018   BUN 15 06/21/2018   CREATININE 1.28 (H) 06/21/2018   CALCIUM 9.0 06/21/2018   PHOS 3.1 10/21/2017   MG 1.8 06/19/2018   Lab Results  Component Value Date   INR 1.06 08/24/2016   Lab Results  Component Value Date   CHOL 97 06/18/2018   HDL 36 (L) 06/18/2018   LDLCALC 49 06/18/2018   TRIG 62 06/18/2018     GEN- The patient is well appearing, alert and oriented x 3 today.   Head- normocephalic, atraumatic Eyes-  Sclera clear, conjunctiva pink Ears- hearing intact Oropharynx- clear Neck- supple, no JVP Lymph- no cervical lymphadenopathy Lungs- Clear to ausculation bilaterally, normal work of breathing Heart- Regular rate and rhythm, no murmurs, rubs or gallops, PMI not laterally displaced GI- soft, NT, ND, + BS Extremities- no clubbing, cyanosis, or edema MS- no significant deformity or atrophy Skin- no rash or lesion Psych- euthymic mood, full affect Neuro- strength  and sensation are intact  EKG- shows AV paced rhythm at 65 bpm Brief interrogation confirms SR  12/17-Prox LAD lesion is 40% stenosed. Otherwise minimal CAD with a left dominant system.  The left ventricular systolic function is normal.  LV end diastolic pressure is moderately  elevated.   SUMMARY:  Angiographically normal coronary arteries with only mild to moderate proximal LAD disease.  Left dominant system. Normal LVEF with moderately elevated EDP.   Assessment and Plan: 1. Paroxysmal afib Now self converted on flecainide 100 mg bid and did not need cardioversion Continue flecainide 100 mg bid  Continue metoprolol without change  2. Chest pain Resolved Recent cath showed mild to mod CAD  Will forward my note to make sure Dr. Rayann Heman is OK to continue flecainide  3. Chadsvasc score of at least 6 Continue Eliquis 5 mg bid  F/u with Dr. Rayann Heman 08/04/18  Geroge Baseman. Elisheva Fallas, [facility] [address] [place], [place] [address] [phone]

## 2018-08-04 ENCOUNTER — Ambulatory Visit (INDEPENDENT_AMBULATORY_CARE_PROVIDER_SITE_OTHER): Payer: Medicare HMO | Admitting: Internal Medicine

## 2018-08-04 ENCOUNTER — Encounter: Payer: Self-pay | Admitting: Internal Medicine

## 2018-08-04 VITALS — BP 134/68 | HR 82 | Ht 65.0 in | Wt 230.0 lb

## 2018-08-04 DIAGNOSIS — Z959 Presence of cardiac and vascular implant and graft, unspecified: Secondary | ICD-10-CM | POA: Diagnosis not present

## 2018-08-04 DIAGNOSIS — I4819 Other persistent atrial fibrillation: Secondary | ICD-10-CM | POA: Diagnosis not present

## 2018-08-04 DIAGNOSIS — I442 Atrioventricular block, complete: Secondary | ICD-10-CM

## 2018-08-04 DIAGNOSIS — I495 Sick sinus syndrome: Secondary | ICD-10-CM

## 2018-08-04 NOTE — Progress Notes (Signed)
PCP: Glenda Chroman, MD Primary Cardiologist: Dr Domenic Polite Primary EP:  Dr Rayann Heman  Miranda Sexton is a 78 y.o. female who presents today for routine electrophysiology followup.  Since her last visit, the patient reports doing very well.  She spontaneously converted to sinus and did not require cardioversion.  Today, she denies symptoms of palpitations, chest pain, shortness of breath,  lower extremity edema, dizziness, presyncope, or syncope.  The patient is otherwise without complaint today.   Past Medical History:  Diagnosis Date  . Anxiety   . Asthma   . Carotid artery occlusion   . Chronic bronchitis ([place])   . Chronic diastolic heart failure ([place])   . CKD (chronic kidney disease) stage 2, GFR 60-89 ml/min   . COPD (chronic obstructive pulmonary disease) ([place])   . Coronary atherosclerosis of native coronary artery    Nonobstructive 08/2008  . Daily headache   . DJD (degenerative joint disease)   . Essential hypertension   . GERD (gastroesophageal reflux disease)   . Hyperlipidemia   . Obesity   . On home oxygen therapy   . PAD (peripheral artery disease) (HCC)    Moderate right renal artery stenosis 08/2008  . Paroxysmal atrial fibrillation (HCC)   . Sick sinus syndrome (HCC)    Medtronic dual-chamber his bundle pacemaker - Dr. Rayann Heman  . Type 2 diabetes mellitus ([place])    Past Surgical History:  Procedure Laterality Date  . BIOPSY  08/03/2017   Procedure: BIOPSY;  Surgeon: [name], MD;  [facility];  Service: Endoscopy;;  gastric   . CARDIAC CATHETERIZATION  2014  . CATARACT EXTRACTION W/ INTRAOCULAR LENS  IMPLANT, BILATERAL Bilateral 2008  . COLONOSCOPY N/A 08/03/2017   Procedure: COLONOSCOPY;  Surgeon: [name], MD;  [facility];  Service: Endoscopy;  Laterality: N/A;  . ENDARTERECTOMY Right 12/08/2012   Procedure: ENDARTERECTOMY CAROTID;  Surgeon: [name], MD;  Location: [facility];  Service: Vascular;  Laterality: Right;  . EP  IMPLANTABLE DEVICE N/A 03/02/2016   Procedure: Pacemaker Implant;  Surgeon: [name], MD;  Location: [place] [facility];  Service: Cardiovascular;  Laterality: N/A;  . ESOPHAGOGASTRODUODENOSCOPY N/A 08/03/2017   Procedure: ESOPHAGOGASTRODUODENOSCOPY (EGD);  Surgeon: [name], MD;  [facility];  Service: Endoscopy;  Laterality: N/A;  . INSERT / REPLACE / REMOVE PACEMAKER  03/02/2016  . LEFT HEART CATH AND CORONARY ANGIOGRAPHY N/A 06/20/2018   Procedure: LEFT HEART CATH AND CORONARY ANGIOGRAPHY;  Surgeon: [name], MD;  Location: [facility];  Service: Cardiovascular;  Laterality: N/A;  . PARTIAL COLECTOMY N/A 10/19/2017   Procedure: PARTIAL COLECTOMY;  Surgeon: [name], MD;  [facility];  Service: General;  Laterality: N/A;  . POLYPECTOMY  08/03/2017   Procedure: POLYPECTOMY;  Surgeon: [name], MD;  [facility];  Service: Endoscopy;;  colon    ROS- all systems are reviewed and negative except as per HPI above  Current Outpatient Medications  Medication Sig Dispense Refill  . acetaminophen (TYLENOL) 325 MG tablet Take 325 mg by mouth every 6 (six) hours as needed for mild pain or headache.    . albuterol (PROAIR HFA) 108 (90 Base) MCG/ACT inhaler Inhale 2 puffs into the lungs every 6 (six) hours as needed for wheezing or shortness of breath.     Marland Kitchen aspirin EC 81 MG EC tablet Take 1 tablet (81 mg total) by mouth daily. 30 tablet 0  .  atorvastatin (LIPITOR) 40 MG tablet Take 40 mg by mouth daily.    . benazepril (LOTENSIN) 40 MG tablet Take 40 mg by mouth daily.    Marland Kitchen dicyclomine (BENTYL) 10 MG capsule Take 10 mg by mouth 4 (four) times daily -  before meals and at bedtime.    Marland Kitchen ELIQUIS 5 MG TABS tablet TAKE 1 TABLET BY MOUTH TWICE A DAY 60 tablet 9  . flecainide (TAMBOCOR) 100 MG tablet Take 1 tablet (100 mg total) by mouth 2 (two) times daily. 60 tablet 6  . furosemide (LASIX) 40 MG tablet take 1 tablet by mouth once daily  (Patient taking differently: TAKE 1 TABLET (40 MG) BY MOUTH DAILY IN THE MORNING, MAY TAKE AN ADDITIONAL DOSE IN THE AFTERNOON IF NEEDED FOR FLUID RETENTION.) 15 tablet 0  . glipiZIDE (GLUCOTROL XL) 10 MG 24 hr tablet Take 10 mg by mouth 2 (two) times daily.     . insulin aspart protamine- aspart (NOVOLOG 70/30) (70-30) 100 UNIT/ML injection Inject 44-56 Units into the skin See admin instructions. Inject 56 units in the morning & 44 units in the evening.    . metFORMIN (GLUCOPHAGE) 500 MG tablet Take 500 mg by mouth 2 (two) times daily.     . metoprolol succinate (TOPROL XL) 25 MG 24 hr tablet Take 0.5 tablets (12.5 mg total) by mouth daily. 45 tablet 2  . Multiple Vitamin (MULTIVITAMIN WITH MINERALS) TABS tablet Take 1 tablet by mouth daily. Centrum Silver    . nitroGLYCERIN (NITROSTAT) 0.4 MG SL tablet Place 1 tablet (0.4 mg total) under the tongue every 5 (five) minutes as needed for chest pain. 30 tablet 2  . olopatadine (PATANOL) 0.1 % ophthalmic solution Place 1 drop into both eyes 2 (two) times daily.     . pantoprazole (PROTONIX) 40 MG tablet TAKE 1 TABLET BY MOUTH TWICE DAILY BEFORE MEALS 60 tablet 5  . potassium chloride SA (K-DUR,KLOR-CON) 20 MEQ tablet Take 20 mEq by mouth daily.    . traMADol (ULTRAM) 50 MG tablet Take 1 tablet (50 mg total) by mouth every 6 (six) hours as needed. 25 tablet 0  . venlafaxine XR (EFFEXOR-XR) 150 MG 24 hr capsule Take 150 mg by mouth daily.     No current facility-administered medications for this visit.     Physical Exam: Vitals:   08/04/18 1021  BP: 134/68  Pulse: 82  SpO2: 91%  Weight: 230 lb (104.3 kg)  Height: 5\' 5"  (1.651 m)    GEN- The patient is well appearing, alert and oriented x 3 today.   Head- normocephalic, atraumatic Eyes-  Sclera clear, conjunctiva pink Ears- hearing intact Oropharynx- clear Lungs- Clear to ausculation bilaterally, normal work of breathing Chest- pacemaker pocket is well healed Heart- Regular rate and  rhythm, no murmurs, rubs or gallops, PMI not laterally displaced GI- soft, NT, ND, + BS Extremities- no clubbing, cyanosis, or edema  Pacemaker interrogation- reviewed in detail today,  See PACEART report   ekg today reveals atrial paced rhythm  Assessment and Plan:  1. Symptomatic sinus bradycardia and complete heart block Normal pacemaker function See Pace Art report No changes today RV threshold continues to be an issue.  Threshold has gone 1.75V @1  msec-->2V@1msec  (in December) to 2.5V@1msec  today.  Though unipolar pacing was much better, she has pocket stimulation which limits this programming I have increased output to 5V@1  msec today   2. Persistent afib Remains in afib (none since cardioversion) Continue flecainide 100mg  BID chads2vasc score is  6.  On eliquis  3. HTN Stable No change required today  4. Atypical chest pain Cath showed mild to moderate CAD improved with sinus  carelink Return in 3 months to see me  [name] MD, [facility] 08/04/2018 10:49 AM

## 2018-08-04 NOTE — Patient Instructions (Signed)
Medication Instructions:   Stop Aspirin.   Continue all other medications.    Labwork: none  Testing/Procedures: none  Follow-Up: 3 months   Any Other Special Instructions Will Be Listed Below (If Applicable).  If you need a refill on your cardiac medications before your next appointment, please call your pharmacy.

## 2018-08-07 DIAGNOSIS — I5030 Unspecified diastolic (congestive) heart failure: Secondary | ICD-10-CM | POA: Diagnosis not present

## 2018-08-07 DIAGNOSIS — Z6838 Body mass index (BMI) 38.0-38.9, adult: Secondary | ICD-10-CM | POA: Diagnosis not present

## 2018-08-07 DIAGNOSIS — Z299 Encounter for prophylactic measures, unspecified: Secondary | ICD-10-CM | POA: Diagnosis not present

## 2018-08-07 DIAGNOSIS — I10 Essential (primary) hypertension: Secondary | ICD-10-CM | POA: Diagnosis not present

## 2018-08-07 DIAGNOSIS — J449 Chronic obstructive pulmonary disease, unspecified: Secondary | ICD-10-CM | POA: Diagnosis not present

## 2018-08-07 DIAGNOSIS — R42 Dizziness and giddiness: Secondary | ICD-10-CM | POA: Diagnosis not present

## 2018-08-07 DIAGNOSIS — E1165 Type 2 diabetes mellitus with hyperglycemia: Secondary | ICD-10-CM | POA: Diagnosis not present

## 2018-08-10 DIAGNOSIS — I110 Hypertensive heart disease with heart failure: Secondary | ICD-10-CM | POA: Diagnosis not present

## 2018-08-10 DIAGNOSIS — E1142 Type 2 diabetes mellitus with diabetic polyneuropathy: Secondary | ICD-10-CM | POA: Diagnosis not present

## 2018-08-10 DIAGNOSIS — I25119 Atherosclerotic heart disease of native coronary artery with unspecified angina pectoris: Secondary | ICD-10-CM | POA: Diagnosis not present

## 2018-08-10 DIAGNOSIS — I509 Heart failure, unspecified: Secondary | ICD-10-CM | POA: Diagnosis not present

## 2018-08-10 DIAGNOSIS — J449 Chronic obstructive pulmonary disease, unspecified: Secondary | ICD-10-CM | POA: Diagnosis not present

## 2018-08-10 DIAGNOSIS — Z6841 Body Mass Index (BMI) 40.0 and over, adult: Secondary | ICD-10-CM | POA: Diagnosis not present

## 2018-08-10 DIAGNOSIS — E1151 Type 2 diabetes mellitus with diabetic peripheral angiopathy without gangrene: Secondary | ICD-10-CM | POA: Diagnosis not present

## 2018-08-10 DIAGNOSIS — I4891 Unspecified atrial fibrillation: Secondary | ICD-10-CM | POA: Diagnosis not present

## 2018-08-10 DIAGNOSIS — Z008 Encounter for other general examination: Secondary | ICD-10-CM | POA: Diagnosis not present

## 2018-08-10 DIAGNOSIS — Z794 Long term (current) use of insulin: Secondary | ICD-10-CM | POA: Diagnosis not present

## 2018-08-14 DIAGNOSIS — R69 Illness, unspecified: Secondary | ICD-10-CM | POA: Diagnosis not present

## 2018-08-15 DIAGNOSIS — E1140 Type 2 diabetes mellitus with diabetic neuropathy, unspecified: Secondary | ICD-10-CM | POA: Diagnosis not present

## 2018-08-15 DIAGNOSIS — H81393 Other peripheral vertigo, bilateral: Secondary | ICD-10-CM | POA: Diagnosis not present

## 2018-08-15 DIAGNOSIS — E1159 Type 2 diabetes mellitus with other circulatory complications: Secondary | ICD-10-CM | POA: Diagnosis not present

## 2018-08-24 DIAGNOSIS — E119 Type 2 diabetes mellitus without complications: Secondary | ICD-10-CM | POA: Diagnosis not present

## 2018-08-24 DIAGNOSIS — J449 Chronic obstructive pulmonary disease, unspecified: Secondary | ICD-10-CM | POA: Diagnosis not present

## 2018-08-24 DIAGNOSIS — I509 Heart failure, unspecified: Secondary | ICD-10-CM | POA: Diagnosis not present

## 2018-08-28 DIAGNOSIS — H1045 Other chronic allergic conjunctivitis: Secondary | ICD-10-CM | POA: Diagnosis not present

## 2018-08-28 DIAGNOSIS — Z794 Long term (current) use of insulin: Secondary | ICD-10-CM | POA: Diagnosis not present

## 2018-08-28 DIAGNOSIS — E119 Type 2 diabetes mellitus without complications: Secondary | ICD-10-CM | POA: Diagnosis not present

## 2018-08-28 DIAGNOSIS — Z7984 Long term (current) use of oral hypoglycemic drugs: Secondary | ICD-10-CM | POA: Diagnosis not present

## 2018-08-30 DIAGNOSIS — Z299 Encounter for prophylactic measures, unspecified: Secondary | ICD-10-CM | POA: Diagnosis not present

## 2018-08-30 DIAGNOSIS — E538 Deficiency of other specified B group vitamins: Secondary | ICD-10-CM | POA: Diagnosis not present

## 2018-08-30 DIAGNOSIS — Z6837 Body mass index (BMI) 37.0-37.9, adult: Secondary | ICD-10-CM | POA: Diagnosis not present

## 2018-08-30 DIAGNOSIS — I10 Essential (primary) hypertension: Secondary | ICD-10-CM | POA: Diagnosis not present

## 2018-08-30 DIAGNOSIS — E559 Vitamin D deficiency, unspecified: Secondary | ICD-10-CM | POA: Diagnosis not present

## 2018-08-30 DIAGNOSIS — R42 Dizziness and giddiness: Secondary | ICD-10-CM | POA: Diagnosis not present

## 2018-09-20 LAB — CUP PACEART INCLINIC DEVICE CHECK
Battery Remaining Longevity: 37 mo
Battery Voltage: 2.98 V
Brady Statistic AP VP Percent: 99.92 %
Brady Statistic AP VS Percent: 0 %
Brady Statistic AS VP Percent: 0.07 %
Brady Statistic AS VS Percent: 0 %
Brady Statistic RA Percent Paced: 99.91 %
Implantable Lead Implant Date: 20170829
Implantable Lead Implant Date: 20170829
Implantable Lead Location: 753860
Implantable Lead Model: 3830
Implantable Lead Model: 5076
Implantable Pulse Generator Implant Date: 20170829
Lead Channel Impedance Value: 266 Ohm
Lead Channel Impedance Value: 304 Ohm
Lead Channel Impedance Value: 437 Ohm
Lead Channel Impedance Value: 475 Ohm
Lead Channel Pacing Threshold Amplitude: 1 V
Lead Channel Pacing Threshold Amplitude: 2.75 V
Lead Channel Pacing Threshold Pulse Width: 0.4 ms
Lead Channel Pacing Threshold Pulse Width: 1 ms
Lead Channel Setting Pacing Amplitude: 2 V
Lead Channel Setting Pacing Amplitude: 5 V
Lead Channel Setting Pacing Pulse Width: 1 ms
MDC IDC LEAD LOCATION: 753859
MDC IDC MSMT LEADCHNL RV SENSING INTR AMPL: 4.375 mV
MDC IDC SESS DTM: 20200131164326
MDC IDC SET LEADCHNL RV SENSING SENSITIVITY: 0.9 mV
MDC IDC STAT BRADY RV PERCENT PACED: 99.99 %

## 2018-10-05 DIAGNOSIS — J449 Chronic obstructive pulmonary disease, unspecified: Secondary | ICD-10-CM | POA: Diagnosis not present

## 2018-10-05 DIAGNOSIS — I509 Heart failure, unspecified: Secondary | ICD-10-CM | POA: Diagnosis not present

## 2018-10-05 DIAGNOSIS — E119 Type 2 diabetes mellitus without complications: Secondary | ICD-10-CM | POA: Diagnosis not present

## 2018-11-01 ENCOUNTER — Telehealth: Payer: Self-pay

## 2018-11-02 NOTE — Telephone Encounter (Signed)
Spoke with pt daughter about upcoming appt on 11/03/18. Pt daughter stated pt does not have access to smart device and won't be able to be with pt at the time of appt. Pt daughter was advise to have pt keep phone visit, check vitals prior to appt, and call me back if she has any questions.

## 2018-11-03 ENCOUNTER — Telehealth: Payer: Self-pay | Admitting: *Deleted

## 2018-11-03 ENCOUNTER — Telehealth (INDEPENDENT_AMBULATORY_CARE_PROVIDER_SITE_OTHER): Payer: Medicare HMO | Admitting: Internal Medicine

## 2018-11-03 ENCOUNTER — Ambulatory Visit (INDEPENDENT_AMBULATORY_CARE_PROVIDER_SITE_OTHER): Payer: Medicare HMO | Admitting: *Deleted

## 2018-11-03 ENCOUNTER — Other Ambulatory Visit: Payer: Self-pay

## 2018-11-03 ENCOUNTER — Telehealth: Payer: Self-pay | Admitting: Internal Medicine

## 2018-11-03 ENCOUNTER — Encounter: Payer: Self-pay | Admitting: Internal Medicine

## 2018-11-03 VITALS — BP 157/70 | HR 63 | Ht 65.0 in | Wt 221.0 lb

## 2018-11-03 DIAGNOSIS — R0602 Shortness of breath: Secondary | ICD-10-CM

## 2018-11-03 DIAGNOSIS — I4819 Other persistent atrial fibrillation: Secondary | ICD-10-CM | POA: Diagnosis not present

## 2018-11-03 DIAGNOSIS — Z959 Presence of cardiac and vascular implant and graft, unspecified: Secondary | ICD-10-CM

## 2018-11-03 DIAGNOSIS — I10 Essential (primary) hypertension: Secondary | ICD-10-CM | POA: Diagnosis not present

## 2018-11-03 DIAGNOSIS — I495 Sick sinus syndrome: Secondary | ICD-10-CM | POA: Diagnosis not present

## 2018-11-03 LAB — CUP PACEART REMOTE DEVICE CHECK
Battery Remaining Longevity: 22 mo
Battery Voltage: 2.96 V
Brady Statistic AP VP Percent: 99.91 %
Brady Statistic AP VS Percent: 0 %
Brady Statistic AS VP Percent: 0.09 %
Brady Statistic AS VS Percent: 0 %
Brady Statistic RA Percent Paced: 99.9 %
Brady Statistic RV Percent Paced: 99.97 %
Date Time Interrogation Session: 20200501140645
Implantable Lead Implant Date: 20170829
Implantable Lead Implant Date: 20170829
Implantable Lead Location: 753859
Implantable Lead Location: 753860
Implantable Lead Model: 3830
Implantable Lead Model: 5076
Implantable Pulse Generator Implant Date: 20170829
Lead Channel Impedance Value: 266 Ohm
Lead Channel Impedance Value: 304 Ohm
Lead Channel Impedance Value: 437 Ohm
Lead Channel Impedance Value: 437 Ohm
Lead Channel Pacing Threshold Amplitude: 0.75 V
Lead Channel Pacing Threshold Amplitude: 2.5 V
Lead Channel Pacing Threshold Pulse Width: 0.4 ms
Lead Channel Pacing Threshold Pulse Width: 0.4 ms
Lead Channel Sensing Intrinsic Amplitude: 0.625 mV
Lead Channel Sensing Intrinsic Amplitude: 0.625 mV
Lead Channel Sensing Intrinsic Amplitude: 4.5 mV
Lead Channel Sensing Intrinsic Amplitude: 4.5 mV
Lead Channel Setting Pacing Amplitude: 2 V
Lead Channel Setting Pacing Amplitude: 5 V
Lead Channel Setting Pacing Pulse Width: 1 ms
Lead Channel Setting Sensing Sensitivity: 0.9 mV

## 2018-11-03 NOTE — Telephone Encounter (Signed)
Message  Received: Today  Message Contents  [name], MD sent to Laurine Blazer, LPN        Please let her know that her pacemaker remote from today looks normal  No afib  Device is functioning well.  I am not sure why she doesn't feel well.  If persists, she should follow-up with Domenic Polite.   Follow-up with me in 6 months.   Thanks!

## 2018-11-03 NOTE — Telephone Encounter (Signed)
Spoke with pt regarding her medications Pt meds have been updated.

## 2018-11-03 NOTE — Progress Notes (Signed)
Edd Fabian,  Please let her know that her pacemaker remote from today looks normal No afib Device is functioning well. I am not sure why she doesn't feel well. If persists, she should follow-up with Domenic Polite.  Follow-up with me in 6 months.  [name] MD, [place] 11/03/2018 2:11 PM

## 2018-11-03 NOTE — Telephone Encounter (Signed)
Patient notified and verbalized understanding. 

## 2018-11-03 NOTE — Progress Notes (Signed)
Electrophysiology TeleHealth Note   Due to national recommendations of social distancing due to [place] 19, an audio telehealth visit is felt to be most appropriate for this patient at this time.  Verbal consent was obtained today.  She states that she is unable to connect virtually for video visit due to technology issues.   Date:  11/03/2018   ID:  Miranda Sexton, DOB December 24, 1940, MRN [id number]  Location: patient's home  Provider location: [facility]  Evaluation Performed: Follow-up visit  PCP:  Glenda Chroman, MD  Cardiologist:  Rozann Lesches, MD  Electrophysiologist:  Dr Rayann Heman  Chief Complaint:  SOB  History of Present Illness:    Miranda Sexton is a 78 y.o. female who presents via audio conferencing for a telehealth visit today.  Since last being seen in our clinic, the patient reports doing reasonably well.  She has noticed increased SOB and fatigue for 2 weeks.  Today, she denies symptoms of palpitations, chest pain,  lower extremity edema, dizziness, presyncope, or syncope.  The patient is otherwise without complaint today.  The patient denies symptoms of fevers, chills, cough, or new SOB worrisome for COVID 19.  Past Medical History:  Diagnosis Date  . Anxiety   . Asthma   . Carotid artery occlusion   . Chronic bronchitis ([place])   . Chronic diastolic heart failure ([place])   . CKD (chronic kidney disease) stage 2, GFR 60-89 ml/min   . COPD (chronic obstructive pulmonary disease) ([place])   . Coronary atherosclerosis of native coronary artery    Nonobstructive 08/2008  . Daily headache   . DJD (degenerative joint disease)   . Essential hypertension   . GERD (gastroesophageal reflux disease)   . Hyperlipidemia   . Obesity   . On home oxygen therapy   . PAD (peripheral artery disease) (HCC)    Moderate right renal artery stenosis 08/2008  . Paroxysmal atrial fibrillation (HCC)   . Sick sinus syndrome (HCC)    Medtronic dual-chamber his bundle pacemaker - Dr. Rayann Heman  .  Type 2 diabetes mellitus ([place])     Past Surgical History:  Procedure Laterality Date  . BIOPSY  08/03/2017   Procedure: BIOPSY;  Surgeon: [name], MD;  [facility];  Service: Endoscopy;;  gastric   . CARDIAC CATHETERIZATION  2014  . CATARACT EXTRACTION W/ INTRAOCULAR LENS  IMPLANT, BILATERAL Bilateral 2008  . COLONOSCOPY N/A 08/03/2017   Procedure: COLONOSCOPY;  Surgeon: [name], MD;  [facility];  Service: Endoscopy;  Laterality: N/A;  . ENDARTERECTOMY Right 12/08/2012   Procedure: ENDARTERECTOMY CAROTID;  Surgeon: [name], MD;  Location: [place];  Service: Vascular;  Laterality: Right;  . EP IMPLANTABLE DEVICE N/A 03/02/2016   Procedure: Pacemaker Implant;  Surgeon: [name], MD;  Location: [place] [facility];  Service: Cardiovascular;  Laterality: N/A;  . ESOPHAGOGASTRODUODENOSCOPY N/A 08/03/2017   Procedure: ESOPHAGOGASTRODUODENOSCOPY (EGD);  Surgeon: [name], MD;  [facility];  Service: Endoscopy;  Laterality: N/A;  . INSERT / REPLACE / REMOVE PACEMAKER  03/02/2016  . LEFT HEART CATH AND CORONARY ANGIOGRAPHY N/A 06/20/2018   Procedure: LEFT HEART CATH AND CORONARY ANGIOGRAPHY;  Surgeon: [name], MD;  Location: [place] [facility];  Service: Cardiovascular;  Laterality: N/A;  . PARTIAL COLECTOMY N/A 10/19/2017   Procedure: PARTIAL COLECTOMY;  Surgeon: [name], MD;  Location: [facility];  Service: General;  Laterality: N/A;  . POLYPECTOMY  08/03/2017  Procedure: POLYPECTOMY;  Surgeon: [name], MD;  [facility];  Service: Endoscopy;;  colon    Current Outpatient Medications  Medication Sig Dispense Refill  . acetaminophen (TYLENOL) 325 MG tablet Take 325 mg by mouth every 6 (six) hours as needed for mild pain or headache.    . albuterol (PROAIR HFA) 108 (90 Base) MCG/ACT inhaler Inhale 2 puffs into the lungs every 6 (six) hours as needed for wheezing or shortness of breath.     Marland Kitchen  atorvastatin (LIPITOR) 40 MG tablet Take 40 mg by mouth daily.    . benazepril (LOTENSIN) 40 MG tablet Take 40 mg by mouth daily.    Marland Kitchen dicyclomine (BENTYL) 10 MG capsule Take 10 mg by mouth 4 (four) times daily -  before meals and at bedtime.    Marland Kitchen ELIQUIS 5 MG TABS tablet TAKE 1 TABLET BY MOUTH TWICE A DAY 60 tablet 9  . flecainide (TAMBOCOR) 100 MG tablet Take 1 tablet (100 mg total) by mouth 2 (two) times daily. 60 tablet 6  . furosemide (LASIX) 40 MG tablet take 1 tablet by mouth once daily (Patient taking differently: TAKE 1 TABLET (40 MG) BY MOUTH DAILY IN THE MORNING, MAY TAKE AN ADDITIONAL DOSE IN THE AFTERNOON IF NEEDED FOR FLUID RETENTION.) 15 tablet 0  . glipiZIDE (GLUCOTROL XL) 10 MG 24 hr tablet Take 10 mg by mouth 2 (two) times daily.     . insulin aspart protamine- aspart (NOVOLOG 70/30) (70-30) 100 UNIT/ML injection Inject 44-56 Units into the skin See admin instructions. Inject 56 units in the morning & 44 units in the evening.    . metFORMIN (GLUCOPHAGE) 500 MG tablet Take 500 mg by mouth 2 (two) times daily.     . metoprolol succinate (TOPROL XL) 25 MG 24 hr tablet Take 0.5 tablets (12.5 mg total) by mouth daily. 45 tablet 2  . Multiple Vitamin (MULTIVITAMIN WITH MINERALS) TABS tablet Take 1 tablet by mouth daily. Centrum Silver    . nitroGLYCERIN (NITROSTAT) 0.4 MG SL tablet Place 1 tablet (0.4 mg total) under the tongue every 5 (five) minutes as needed for chest pain. 30 tablet 2  . olopatadine (PATANOL) 0.1 % ophthalmic solution Place 1 drop into both eyes 2 (two) times daily.     . pantoprazole (PROTONIX) 40 MG tablet TAKE 1 TABLET BY MOUTH TWICE DAILY BEFORE MEALS 60 tablet 5  . potassium chloride SA (K-DUR,KLOR-CON) 20 MEQ tablet Take 20 mEq by mouth daily.    . traMADol (ULTRAM) 50 MG tablet Take 1 tablet (50 mg total) by mouth every 6 (six) hours as needed. 25 tablet 0  . venlafaxine XR (EFFEXOR-XR) 150 MG 24 hr capsule Take 150 mg by mouth daily.     No current  facility-administered medications for this visit.     Allergies:   Codeine and Penicillins   Social History:  The patient  reports that she has never smoked. She has never used smokeless tobacco. She reports that she does not drink alcohol or use drugs.   Family History:  The patient's  family history includes Cancer in her mother; Deep vein thrombosis in her mother; Diabetes in her brother, mother, and sister; Heart disease in her mother and sister; Hyperlipidemia in her brother and sister; Hypertension in her brother, father, mother, and sister.   ROS:  Please see the history of present illness.   All other systems are personally reviewed and negative.    Exam:    Vital  Signs:  BP (!) 157/70   Pulse 63   Ht 5\' 5"  (1.651 m)   Wt 221 lb (100.2 kg)   BMI 36.78 kg/m   Well sounding   Labs/Other Tests and Data Reviewed:    Recent Labs: 06/17/2018: B Natriuretic Peptide 402.0 06/19/2018: Magnesium 1.8 06/21/2018: BUN 15; Creatinine, Ser 1.28; Hemoglobin 9.9; Platelets 308; Potassium 3.9; Sodium 138   Wt Readings from Last 3 Encounters:  11/03/18 221 lb (100.2 kg)  08/04/18 230 lb (104.3 kg)  07/03/18 229 lb (103.9 kg)     Other studies personally reviewed: Additional studies/ records that were reviewed today include: my prior office notes  Review of the above records today demonstrates: as above Prior cat h  Remotes are overdue   ASSESSMENT & PLAN:    1.  Fatigue and SOB I worry that this could be due to recurrence of afib. I will have her send a remote transmission today.   If in afib, we should have her follow-up closely in the AF clinic.  Previously afib resulted in hospitalization and cath due to intolerance.  She is on eliquis for chads2vasc score of 6 Continue flecainide  2. Sinus bradycardia/ AV block Chronically elevated RV threshold Will have her send a remote today  3. Persistent afib As above  4. HTN Stable No change required today  5. COVID 19  screen The patient denies symptoms of COVID 19 at this time.  The importance of social distancing was discussed today.  Follow-up:  Will need close follow-up depending on results of remote Next remote: today  Current medicines are reviewed at length with the patient today.   The patient does not have concerns regarding her medicines.  The following changes were made today:  none  Labs/ tests ordered today include:  No orders of the defined types were placed in this encounter.  Patient Risk:  after full review of this patients clinical status, I feel that they are at moderate risk at this time.  Today, I have spent 20 minutes with the patient with telehealth technology discussing afib and SOB .    SignedThompson Grayer, MD  11/03/2018 1:37 PM     [place] [id number] [phone] (office) [phone] (fax)

## 2018-11-03 NOTE — Telephone Encounter (Signed)
New Message:    Patient calling stating she is suppose to call before her appt to tell them about some medication. Please call patient back.

## 2018-11-08 ENCOUNTER — Encounter: Payer: Self-pay | Admitting: Cardiology

## 2018-11-08 NOTE — Progress Notes (Signed)
Remote pacemaker transmission.   

## 2018-11-18 DIAGNOSIS — R69 Illness, unspecified: Secondary | ICD-10-CM | POA: Diagnosis not present

## 2018-11-22 DIAGNOSIS — I4892 Unspecified atrial flutter: Secondary | ICD-10-CM | POA: Diagnosis not present

## 2018-11-22 DIAGNOSIS — I10 Essential (primary) hypertension: Secondary | ICD-10-CM | POA: Diagnosis not present

## 2018-11-22 DIAGNOSIS — Z6839 Body mass index (BMI) 39.0-39.9, adult: Secondary | ICD-10-CM | POA: Diagnosis not present

## 2018-11-22 DIAGNOSIS — I4891 Unspecified atrial fibrillation: Secondary | ICD-10-CM | POA: Diagnosis not present

## 2018-11-22 DIAGNOSIS — I509 Heart failure, unspecified: Secondary | ICD-10-CM | POA: Diagnosis not present

## 2018-11-22 DIAGNOSIS — E1165 Type 2 diabetes mellitus with hyperglycemia: Secondary | ICD-10-CM | POA: Diagnosis not present

## 2018-11-22 DIAGNOSIS — Z299 Encounter for prophylactic measures, unspecified: Secondary | ICD-10-CM | POA: Diagnosis not present

## 2018-11-28 DIAGNOSIS — J449 Chronic obstructive pulmonary disease, unspecified: Secondary | ICD-10-CM | POA: Diagnosis not present

## 2018-11-28 DIAGNOSIS — I509 Heart failure, unspecified: Secondary | ICD-10-CM | POA: Diagnosis not present

## 2018-11-28 DIAGNOSIS — E119 Type 2 diabetes mellitus without complications: Secondary | ICD-10-CM | POA: Diagnosis not present

## 2018-12-20 ENCOUNTER — Other Ambulatory Visit: Payer: Self-pay | Admitting: *Deleted

## 2018-12-20 MED ORDER — FLECAINIDE ACETATE 100 MG PO TABS: 100.0000 mg | ORAL_TABLET | Freq: Two times a day (BID) | ORAL | 6 refills | Status: DC

## 2019-01-07 ENCOUNTER — Other Ambulatory Visit: Payer: Self-pay | Admitting: Internal Medicine

## 2019-01-08 NOTE — Telephone Encounter (Signed)
Prescription refill request for Eliquis received.  Last office visit: Dr. Rayann Heman (11-03-2018) Scr:  1.28 (06-21-2018) Age: 78 y.o.  Weight: 104.3 kg (08-04-2018)  Prescription refill sent

## 2019-01-16 DIAGNOSIS — J449 Chronic obstructive pulmonary disease, unspecified: Secondary | ICD-10-CM | POA: Diagnosis not present

## 2019-01-16 DIAGNOSIS — I509 Heart failure, unspecified: Secondary | ICD-10-CM | POA: Diagnosis not present

## 2019-01-16 DIAGNOSIS — E119 Type 2 diabetes mellitus without complications: Secondary | ICD-10-CM | POA: Diagnosis not present

## 2019-01-22 ENCOUNTER — Other Ambulatory Visit: Payer: Self-pay | Admitting: Cardiology

## 2019-01-27 DIAGNOSIS — T1490XA Injury, unspecified, initial encounter: Secondary | ICD-10-CM | POA: Diagnosis not present

## 2019-01-27 DIAGNOSIS — R5381 Other malaise: Secondary | ICD-10-CM | POA: Diagnosis not present

## 2019-01-28 DIAGNOSIS — S3991XA Unspecified injury of abdomen, initial encounter: Secondary | ICD-10-CM | POA: Diagnosis not present

## 2019-01-28 DIAGNOSIS — M25512 Pain in left shoulder: Secondary | ICD-10-CM | POA: Diagnosis not present

## 2019-01-28 DIAGNOSIS — R109 Unspecified abdominal pain: Secondary | ICD-10-CM | POA: Diagnosis not present

## 2019-01-28 DIAGNOSIS — M542 Cervicalgia: Secondary | ICD-10-CM | POA: Diagnosis not present

## 2019-01-28 DIAGNOSIS — M25511 Pain in right shoulder: Secondary | ICD-10-CM | POA: Diagnosis not present

## 2019-01-28 DIAGNOSIS — S3993XA Unspecified injury of pelvis, initial encounter: Secondary | ICD-10-CM | POA: Diagnosis not present

## 2019-01-28 DIAGNOSIS — S3992XA Unspecified injury of lower back, initial encounter: Secondary | ICD-10-CM | POA: Diagnosis not present

## 2019-01-28 DIAGNOSIS — S299XXA Unspecified injury of thorax, initial encounter: Secondary | ICD-10-CM | POA: Diagnosis not present

## 2019-01-28 DIAGNOSIS — S199XXA Unspecified injury of neck, initial encounter: Secondary | ICD-10-CM | POA: Diagnosis not present

## 2019-01-28 DIAGNOSIS — M546 Pain in thoracic spine: Secondary | ICD-10-CM | POA: Diagnosis not present

## 2019-01-28 DIAGNOSIS — R51 Headache: Secondary | ICD-10-CM | POA: Diagnosis not present

## 2019-01-28 DIAGNOSIS — M549 Dorsalgia, unspecified: Secondary | ICD-10-CM | POA: Diagnosis not present

## 2019-01-28 DIAGNOSIS — S0990XA Unspecified injury of head, initial encounter: Secondary | ICD-10-CM | POA: Diagnosis not present

## 2019-02-02 ENCOUNTER — Encounter: Payer: Medicare HMO | Admitting: *Deleted

## 2019-02-02 ENCOUNTER — Telehealth: Payer: Self-pay

## 2019-02-02 NOTE — Telephone Encounter (Signed)
Left message for patient to remind of missed remote transmission.  

## 2019-02-05 ENCOUNTER — Other Ambulatory Visit: Payer: Self-pay | Admitting: Cardiology

## 2019-02-05 MED ORDER — POTASSIUM CHLORIDE CRYS ER 20 MEQ PO TBCR: 20.0000 meq | EXTENDED_RELEASE_TABLET | Freq: Every day | ORAL | 0 refills | Status: AC

## 2019-02-05 NOTE — Telephone Encounter (Signed)
Medication sent to pharmacy  

## 2019-02-05 NOTE — Telephone Encounter (Signed)
° ° ° °  1. Which medications need to be refilled? (please list name of each medication and dose if known)  potassium chloride SA (K-DUR,KLOR-CON) 20 MEQ tablet [[id number]]    2. Which pharmacy/location (including street and city if local pharmacy) is medication to be sent to?[facility], [place], [place]  3. Do they need a 30 day or 90 day supply?

## 2019-02-06 ENCOUNTER — Ambulatory Visit: Payer: Medicare HMO | Admitting: Cardiology

## 2019-02-08 ENCOUNTER — Encounter: Payer: Self-pay | Admitting: Cardiology

## 2019-02-12 DIAGNOSIS — Z1211 Encounter for screening for malignant neoplasm of colon: Secondary | ICD-10-CM | POA: Diagnosis not present

## 2019-02-12 DIAGNOSIS — Z6839 Body mass index (BMI) 39.0-39.9, adult: Secondary | ICD-10-CM | POA: Diagnosis not present

## 2019-02-12 DIAGNOSIS — J449 Chronic obstructive pulmonary disease, unspecified: Secondary | ICD-10-CM | POA: Diagnosis not present

## 2019-02-12 DIAGNOSIS — Z7189 Other specified counseling: Secondary | ICD-10-CM | POA: Diagnosis not present

## 2019-02-12 DIAGNOSIS — E1165 Type 2 diabetes mellitus with hyperglycemia: Secondary | ICD-10-CM | POA: Diagnosis not present

## 2019-02-12 DIAGNOSIS — Z1331 Encounter for screening for depression: Secondary | ICD-10-CM | POA: Diagnosis not present

## 2019-02-12 DIAGNOSIS — Z299 Encounter for prophylactic measures, unspecified: Secondary | ICD-10-CM | POA: Diagnosis not present

## 2019-02-12 DIAGNOSIS — Z1339 Encounter for screening examination for other mental health and behavioral disorders: Secondary | ICD-10-CM | POA: Diagnosis not present

## 2019-02-12 DIAGNOSIS — I10 Essential (primary) hypertension: Secondary | ICD-10-CM | POA: Diagnosis not present

## 2019-02-12 DIAGNOSIS — Z0000 Encounter for general adult medical examination without abnormal findings: Secondary | ICD-10-CM | POA: Diagnosis not present

## 2019-02-15 DIAGNOSIS — R69 Illness, unspecified: Secondary | ICD-10-CM | POA: Diagnosis not present

## 2019-02-28 DIAGNOSIS — I4891 Unspecified atrial fibrillation: Secondary | ICD-10-CM | POA: Diagnosis not present

## 2019-02-28 DIAGNOSIS — I5030 Unspecified diastolic (congestive) heart failure: Secondary | ICD-10-CM | POA: Diagnosis not present

## 2019-02-28 DIAGNOSIS — I10 Essential (primary) hypertension: Secondary | ICD-10-CM | POA: Diagnosis not present

## 2019-02-28 DIAGNOSIS — I4892 Unspecified atrial flutter: Secondary | ICD-10-CM | POA: Diagnosis not present

## 2019-02-28 DIAGNOSIS — E1165 Type 2 diabetes mellitus with hyperglycemia: Secondary | ICD-10-CM | POA: Diagnosis not present

## 2019-02-28 DIAGNOSIS — Z299 Encounter for prophylactic measures, unspecified: Secondary | ICD-10-CM | POA: Diagnosis not present

## 2019-02-28 DIAGNOSIS — Z6839 Body mass index (BMI) 39.0-39.9, adult: Secondary | ICD-10-CM | POA: Diagnosis not present

## 2019-03-02 DIAGNOSIS — E119 Type 2 diabetes mellitus without complications: Secondary | ICD-10-CM | POA: Diagnosis not present

## 2019-03-02 DIAGNOSIS — J449 Chronic obstructive pulmonary disease, unspecified: Secondary | ICD-10-CM | POA: Diagnosis not present

## 2019-03-02 DIAGNOSIS — I509 Heart failure, unspecified: Secondary | ICD-10-CM | POA: Diagnosis not present

## 2019-03-06 DIAGNOSIS — Z961 Presence of intraocular lens: Secondary | ICD-10-CM | POA: Diagnosis not present

## 2019-03-06 DIAGNOSIS — H26493 Other secondary cataract, bilateral: Secondary | ICD-10-CM | POA: Diagnosis not present

## 2019-03-28 ENCOUNTER — Ambulatory Visit (INDEPENDENT_AMBULATORY_CARE_PROVIDER_SITE_OTHER): Payer: Medicare HMO | Admitting: Nurse Practitioner

## 2019-03-28 ENCOUNTER — Encounter (INDEPENDENT_AMBULATORY_CARE_PROVIDER_SITE_OTHER): Payer: Self-pay | Admitting: *Deleted

## 2019-03-28 ENCOUNTER — Other Ambulatory Visit: Payer: Self-pay

## 2019-03-28 ENCOUNTER — Encounter (INDEPENDENT_AMBULATORY_CARE_PROVIDER_SITE_OTHER): Payer: Self-pay | Admitting: Nurse Practitioner

## 2019-03-28 ENCOUNTER — Telehealth (INDEPENDENT_AMBULATORY_CARE_PROVIDER_SITE_OTHER): Payer: Self-pay | Admitting: *Deleted

## 2019-03-28 VITALS — BP 105/72 | HR 83 | Temp 98.3°F | Ht 64.3 in | Wt 228.0 lb

## 2019-03-28 DIAGNOSIS — R195 Other fecal abnormalities: Secondary | ICD-10-CM

## 2019-03-28 DIAGNOSIS — R1310 Dysphagia, unspecified: Secondary | ICD-10-CM | POA: Diagnosis not present

## 2019-03-28 NOTE — Telephone Encounter (Signed)
Patient is aware 

## 2019-03-28 NOTE — Telephone Encounter (Signed)
Patient is scheduled for endoscopy 05/11/19 and needs to stop Eliquis 2 days before procedure -- please advise if ok to stop, thanks
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
## 2019-03-28 NOTE — Patient Instructions (Signed)
1. Schedule an EGD  2. Complete the ordered blood tests  3. Call our office if you see further black stools

## 2019-03-28 NOTE — Progress Notes (Signed)
   Subjective:    Patient ID: Miranda Sexton, female    DOB: [birth year], 78 y.o.   MRN: [id number]  Past Medical History:  Diagnosis Date  . Anxiety   . Asthma   . Carotid artery occlusion   . Chronic bronchitis ([place])   . Chronic diastolic heart failure ([place])   . CKD (chronic kidney disease) stage 2, GFR 60-89 ml/min   . COPD (chronic obstructive pulmonary disease) ([place])   . Coronary atherosclerosis of native coronary artery    Nonobstructive 08/2008  . Daily headache   . DJD (degenerative joint disease)   . Essential hypertension   . GERD (gastroesophageal reflux disease)   . Hyperlipidemia   . Obesity   . On home oxygen therapy   . PAD (peripheral artery disease) (HCC)    Moderate right renal artery stenosis 08/2008  . Paroxysmal atrial fibrillation (HCC)   . Sick sinus syndrome (HCC)    Medtronic dual-chamber his bundle pacemaker - Dr. Rayann Heman  . Type 2 diabetes mellitus ([place])    Past Surgical History:  Procedure Laterality Date  . BIOPSY  08/03/2017   Procedure: BIOPSY;  Surgeon: [name], MD;  [facility];  Service: Endoscopy;;  gastric   . CARDIAC CATHETERIZATION  2014  . CATARACT EXTRACTION W/ INTRAOCULAR LENS  IMPLANT, BILATERAL Bilateral 2008  . COLONOSCOPY N/A 08/03/2017   Procedure: COLONOSCOPY;  Surgeon: [name], MD;  [facility];  Service: Endoscopy;  Laterality: N/A;  . ENDARTERECTOMY Right 12/08/2012   Procedure: ENDARTERECTOMY CAROTID;  Surgeon: [name], MD;  Location: [place];  Service: Vascular;  Laterality: Right;  . EP IMPLANTABLE DEVICE N/A 03/02/2016   Procedure: Pacemaker Implant;  Surgeon: [name], MD;  Location: [place] [facility];  Service: Cardiovascular;  Laterality: N/A;  . ESOPHAGOGASTRODUODENOSCOPY N/A 08/03/2017   Procedure: ESOPHAGOGASTRODUODENOSCOPY (EGD);  Surgeon: [name], MD;  [facility];  Service: Endoscopy;  Laterality: N/A;  . INSERT / REPLACE / REMOVE PACEMAKER   03/02/2016  . LEFT HEART CATH AND CORONARY ANGIOGRAPHY N/A 06/20/2018   Procedure: LEFT HEART CATH AND CORONARY ANGIOGRAPHY;  Surgeon: [name], MD;  Location: [place] [facility];  Service: Cardiovascular;  Laterality: N/A;  . PARTIAL COLECTOMY N/A 10/19/2017   Procedure: PARTIAL COLECTOMY;  Surgeon: [name], MD;  [facility];  Service: General;  Laterality: N/A;  . POLYPECTOMY  08/03/2017   Procedure: POLYPECTOMY;  Surgeon: [name], MD;  [facility];  Service: Endoscopy;;  colon     HPI    Review of Systems     Objective:   Physical Exam        Assessment & Plan:

## 2019-03-28 NOTE — Telephone Encounter (Signed)
Yes, may hold Eliquis as requested prior to endoscopy.

## 2019-03-29 LAB — COMPLETE METABOLIC PANEL WITH GFR
AG Ratio: 1.1 (calc) (ref 1.0–2.5)
ALT: 11 U/L (ref 6–29)
AST: 15 U/L (ref 10–35)
Albumin: 3.9 g/dL (ref 3.6–5.1)
Alkaline phosphatase (APISO): 88 U/L (ref 37–153)
BUN/Creatinine Ratio: 15 (calc) (ref 6–22)
BUN: 20 mg/dL (ref 7–25)
CO2: 33 mmol/L — ABNORMAL HIGH (ref 20–32)
Calcium: 9 mg/dL (ref 8.6–10.4)
Chloride: 101 mmol/L (ref 98–110)
Creat: 1.3 mg/dL — ABNORMAL HIGH (ref 0.60–0.93)
GFR, Est African American: 46 mL/min/{1.73_m2} — ABNORMAL LOW (ref >=60)
GFR, Est Non African American: 39 mL/min/{1.73_m2} — ABNORMAL LOW (ref >=60)
Globulin: 3.4 g/dL (calc) (ref 1.9–3.7)
Glucose, Bld: 108 mg/dL (ref 65–139)
Potassium: 4.2 mmol/L (ref 3.5–5.3)
Sodium: 141 mmol/L (ref 135–146)
Total Bilirubin: 0.3 mg/dL (ref 0.2–1.2)
Total Protein: 7.3 g/dL (ref 6.1–8.1)

## 2019-03-29 LAB — CBC WITH DIFFERENTIAL/PLATELET
Absolute Monocytes: 830 cells/uL (ref 200–950)
Basophils Absolute: 61 cells/uL (ref 0–200)
Basophils Relative: 0.9 %
Eosinophils Absolute: 449 cells/uL (ref 15–500)
Eosinophils Relative: 6.6 %
HCT: 36.1 % (ref 35.0–45.0)
Hemoglobin: 11.3 g/dL — ABNORMAL LOW (ref 11.7–15.5)
Lymphs Abs: 1822 cells/uL (ref 850–3900)
MCH: 27 pg (ref 27.0–33.0)
MCHC: 31.3 g/dL — ABNORMAL LOW (ref 32.0–36.0)
MCV: 86.2 fL (ref 80.0–100.0)
MPV: 11.5 fL (ref 7.5–12.5)
Monocytes Relative: 12.2 %
Neutro Abs: 3638 cells/uL (ref 1500–7800)
Neutrophils Relative %: 53.5 %
Platelets: 350 10*3/uL (ref 140–400)
RBC: 4.19 10*6/uL (ref 3.80–5.10)
RDW: 13.7 % (ref 11.0–15.0)
Total Lymphocyte: 26.8 %
WBC: 6.8 10*3/uL (ref 3.8–10.8)

## 2019-04-02 DIAGNOSIS — E119 Type 2 diabetes mellitus without complications: Secondary | ICD-10-CM | POA: Diagnosis not present

## 2019-04-02 DIAGNOSIS — J449 Chronic obstructive pulmonary disease, unspecified: Secondary | ICD-10-CM | POA: Diagnosis not present

## 2019-04-02 DIAGNOSIS — I509 Heart failure, unspecified: Secondary | ICD-10-CM | POA: Diagnosis not present

## 2019-04-06 ENCOUNTER — Telehealth (INDEPENDENT_AMBULATORY_CARE_PROVIDER_SITE_OTHER): Payer: Medicare HMO | Admitting: Internal Medicine

## 2019-04-06 ENCOUNTER — Telehealth: Payer: Self-pay

## 2019-04-06 DIAGNOSIS — I495 Sick sinus syndrome: Secondary | ICD-10-CM

## 2019-04-06 NOTE — Progress Notes (Signed)
Left multiple VMs for patient who did not answer phone for her virtual visit.  We will reschedule for in office visit.  Overdue for remotes  [name] MD, [place] 04/06/2019 12:39 PM

## 2019-04-06 NOTE — Telephone Encounter (Signed)
I help the pt send a manual transmission with her home monitor. The transmission was unsuccessful. I gave her the number to [facility] support to get additional help.

## 2019-04-06 NOTE — Telephone Encounter (Signed)
-----   Message from [name], MD sent at 04/06/2019 12:40 PM EDT ----- Overdue for remotes  Please have her send a transmission and schedule for remotes going forward

## 2019-04-09 ENCOUNTER — Telehealth: Payer: Self-pay | Admitting: Cardiology

## 2019-04-09 NOTE — Telephone Encounter (Signed)

## 2019-04-09 NOTE — Telephone Encounter (Signed)
Pt states she has not called [employer] tech support to see what is going on with her monitor. I asked her to call them if not today to make sure she call them tomorrow because Dr. Rayann Heman wants that transmission. The pt agreed to call [employer] soon.

## 2019-04-10 ENCOUNTER — Other Ambulatory Visit: Payer: Self-pay

## 2019-04-10 ENCOUNTER — Ambulatory Visit (INDEPENDENT_AMBULATORY_CARE_PROVIDER_SITE_OTHER): Payer: Medicare HMO | Admitting: Cardiology

## 2019-04-10 ENCOUNTER — Encounter: Payer: Self-pay | Admitting: Cardiology

## 2019-04-10 VITALS — BP 137/83 | HR 73 | Ht 65.0 in | Wt 236.6 lb

## 2019-04-10 DIAGNOSIS — I10 Essential (primary) hypertension: Secondary | ICD-10-CM | POA: Diagnosis not present

## 2019-04-10 DIAGNOSIS — I480 Paroxysmal atrial fibrillation: Secondary | ICD-10-CM | POA: Diagnosis not present

## 2019-04-10 DIAGNOSIS — R0600 Dyspnea, unspecified: Secondary | ICD-10-CM | POA: Diagnosis not present

## 2019-04-10 DIAGNOSIS — I495 Sick sinus syndrome: Secondary | ICD-10-CM | POA: Diagnosis not present

## 2019-04-10 DIAGNOSIS — R0609 Other forms of dyspnea: Secondary | ICD-10-CM

## 2019-04-10 NOTE — Patient Instructions (Signed)
Medication Instructions:   Your physician recommends that you continue on your current medications as directed. Please refer to the Current Medication list given to you today.  Labwork:  NONE  Testing/Procedures: Your physician has requested that you have an echocardiogram. Echocardiography is a painless test that uses sound waves to create images of your heart. It provides your doctor with information about the size and shape of your heart and how well your heart's chambers and valves are working. This procedure takes approximately one hour. There are no restrictions for this procedure.  Follow-Up:  Your physician recommends that you schedule a follow-up appointment in: 3 months.  Any Other Special Instructions Will Be Listed Below (If Applicable).  If you need a refill on your cardiac medications before your next appointment, please call your pharmacy. 

## 2019-04-10 NOTE — Telephone Encounter (Signed)
Pt has a monitor order date of 04/06/2019

## 2019-04-10 NOTE — Progress Notes (Signed)
Cardiology Office Note  Date: 04/10/2019   ID: Miranda Sexton, DOB [birth year], MRN [id number]  PCP:  Glenda Chroman, MD  Cardiologist:  Rozann Lesches, MD Electrophysiologist:  [name], MD   Chief Complaint  Patient presents with  . Cardiac follow-up    History of Present Illness: Miranda Sexton is a 78 y.o. female last seen in November 2019.  She presents for a routine visit today.  She tells me that she has been short of breath with activity over the last several weeks, no exertional chest pain or tightness, but lack of stamina.  She has had no dizziness or syncope.  She continues to follow with Dr. Rayann Heman in the device clinic, [employer] His bundle pacemaker in place.  Last device interrogation from May showed normal function.  She had 2 AF episodes, the longest lasting 18 minutes.  Unfortunately, her remote monitor is apparently malfunctioning and she has not had a recent EP follow-up or device interrogation, this is in the process of getting corrected.  I personally reviewed her ECG today which shows dual-chamber pacing at 63 bpm.  She states that she has been taking her medications regularly as outlined below.  No bleeding problems on Eliquis.  I reviewed her recent lab work which showed a hemoglobin of 11.3 and creatinine 1.3.  She is scheduled to undergo endoscopy and possible esophageal dilatation with Dr. Laural Golden in November, will be off Eliquis temporarily.  Past Medical History:  Diagnosis Date  . Anxiety   . Asthma   . Carotid artery occlusion   . Chronic bronchitis ([place])   . Chronic diastolic heart failure ([place])   . CKD (chronic kidney disease) stage 2, GFR 60-89 ml/min   . COPD (chronic obstructive pulmonary disease) ([place])   . Coronary atherosclerosis of native coronary artery    Nonobstructive 08/2008  . Daily headache   . DJD (degenerative joint disease)   . Essential hypertension   . GERD (gastroesophageal reflux disease)   . Hyperlipidemia   .  Obesity   . On home oxygen therapy   . PAD (peripheral artery disease) (H[facility])    Moderate right renal artery stenosis 08/2008  . Paroxysmal atrial fibrillation (H[facility])   . Sick sinus syndrome (H[facility])    Medtronic dual-chamber his bundle pacemaker - Dr. Rayann Heman  . Type 2 diabetes mellitus ([place])     Past Surgical History:  Procedure Laterality Date  . BIOPSY  08/03/2017   Procedure: BIOPSY;  Surgeon: [name], MD;  [facility];  Service: Endoscopy;;  gastric   . CARDIAC CATHETERIZATION  2014  . CATARACT EXTRACTION W/ INTRAOCULAR LENS  IMPLANT, BILATERAL Bilateral 2008  . COLONOSCOPY N/A 08/03/2017   Procedure: COLONOSCOPY;  Surgeon: [name], MD;  [facility];  Service: Endoscopy;  Laterality: N/A;  . ENDARTERECTOMY Right 12/08/2012   Procedure: ENDARTERECTOMY CAROTID;  Surgeon: [name], MD;  Location: [facility];  Service: Vascular;  Laterality: Right;  . EP IMPLANTABLE DEVICE N/A 03/02/2016   Procedure: Pacemaker Implant;  Surgeon: [name], MD;  Location: [facility];  Service: Cardiovascular;  Laterality: N/A;  . ESOPHAGOGASTRODUODENOSCOPY N/A 08/03/2017   Procedure: ESOPHAGOGASTRODUODENOSCOPY (EGD);  Surgeon: [name], MD;  [facility];  Service: Endoscopy;  Laterality: N/A;  . INSERT / REPLACE / REMOVE PACEMAKER  03/02/2016  . LEFT HEART CATH AND CORONARY ANGIOGRAPHY N/A 06/20/2018   Procedure: LEFT HEART CATH AND CORONARY ANGIOGRAPHY;  Surgeon: Ellyn Hack,  Leonie Green, MD;  Location: [facility];  Service: Cardiovascular;  Laterality: N/A;  . PARTIAL COLECTOMY N/A 10/19/2017   Procedure: PARTIAL COLECTOMY;  Surgeon: [name], MD;  Location: [facility];  Service: General;  Laterality: N/A;  . POLYPECTOMY  08/03/2017   Procedure: POLYPECTOMY;  Surgeon: [name], MD;  [facility];  Service: Endoscopy;;  colon    Current Outpatient Medications  Medication Sig Dispense Refill  . acetaminophen (TYLENOL)  325 MG tablet Take 325 mg by mouth every 6 (six) hours as needed for mild pain or headache.    . albuterol (PROAIR HFA) 108 (90 Base) MCG/ACT inhaler Inhale 2 puffs into the lungs every 6 (six) hours as needed for wheezing or shortness of breath.     Marland Kitchen atorvastatin (LIPITOR) 40 MG tablet Take 40 mg by mouth daily.    . baclofen (LIORESAL) 10 MG tablet Take 10 mg by mouth 2 (two) times daily.    . benazepril (LOTENSIN) 40 MG tablet Take 40 mg by mouth daily.    Marland Kitchen dicyclomine (BENTYL) 10 MG capsule Take 10 mg by mouth 4 (four) times daily -  before meals and at bedtime.    Marland Kitchen ELIQUIS 5 MG TABS tablet TAKE 1 TABLET BY MOUTH TWICE DAILY 60 tablet 5  . flecainide (TAMBOCOR) 100 MG tablet Take 1 tablet (100 mg total) by mouth 2 (two) times daily. 60 tablet 6  . furosemide (LASIX) 40 MG tablet take 1 tablet by mouth once daily (Patient taking differently: TAKE 1 TABLET (40 MG) BY MOUTH DAILY IN THE MORNING, MAY TAKE AN ADDITIONAL DOSE IN THE AFTERNOON IF NEEDED FOR FLUID RETENTION.) 15 tablet 0  . glipiZIDE (GLUCOTROL XL) 10 MG 24 hr tablet Take 10 mg by mouth daily.     . insulin aspart protamine- aspart (NOVOLOG 70/30) (70-30) 100 UNIT/ML injection Inject 44-56 Units into the skin See admin instructions. Inject 56 units in the morning & 44 units in the evening.    . metFORMIN (GLUCOPHAGE) 500 MG tablet Take 500 mg by mouth 2 (two) times daily.     . metoprolol succinate (TOPROL-XL) 25 MG 24 hr tablet TAKE 1/2 TABLET(12.5MG ) BY MOUTH DAILY 45 tablet 1  . Multiple Vitamin (MULTIVITAMIN WITH MINERALS) TABS tablet Take 1 tablet by mouth daily. Centrum Silver    . nitroGLYCERIN (NITROSTAT) 0.4 MG SL tablet Place 1 tablet (0.4 mg total) under the tongue every 5 (five) minutes as needed for chest pain. 30 tablet 2  . pantoprazole (PROTONIX) 40 MG tablet TAKE 1 TABLET BY MOUTH TWICE DAILY BEFORE MEALS 60 tablet 5  . potassium chloride SA (K-DUR) 20 MEQ tablet Take 1 tablet (20 mEq total) by mouth daily. 90 tablet  0  . venlafaxine XR (EFFEXOR-XR) 150 MG 24 hr capsule Take 150 mg by mouth daily.    Marland Kitchen olopatadine (PATANOL) 0.1 % ophthalmic solution Place 1 drop into both eyes 2 (two) times daily.      No current facility-administered medications for this visit.    Allergies:  Codeine and Penicillins   Social History: The patient  reports that she has never smoked. She has never used smokeless tobacco. She reports that she does not drink alcohol or use drugs.   ROS:  Please see the history of present illness. Otherwise, complete review of systems is positive for none.  All other systems are reviewed and negative.   Physical Exam: VS:  BP 137/83   Pulse 73   Ht  5\' 5"  (1.651 m)   Wt 236 lb 9.6 oz (107.3 kg)   SpO2 96% Comment: on room air  BMI 39.37 kg/m , BMI Body mass index is 39.37 kg/m.  Wt Readings from Last 3 Encounters:  04/10/19 236 lb 9.6 oz (107.3 kg)  03/28/19 228 lb (103.4 kg)  11/03/18 221 lb (100.2 kg)    General: Patient appears comfortable at rest. HEENT: Conjunctiva and lids normal, wearing a mask. Neck: Supple, no elevated JVP or carotid bruits, no thyromegaly. Lungs: Clear to auscultation, nonlabored breathing at rest. Cardiac: Regular rate and rhythm, no S3, soft systolic murmur. Abdomen: Soft, nontender, bowel sounds present. Extremities: No pitting edema, distal pulses 2+. Skin: Warm and dry. Musculoskeletal: No kyphosis. Neuropsychiatric: Alert and oriented x3, affect grossly appropriate.  ECG:  An ECG dated 08/04/2018 was personally reviewed today and demonstrated:  Atrial paced rhythm with possible preexcitation pattern.  Recent Labwork: 06/17/2018: B Natriuretic Peptide 402.0 06/19/2018: Magnesium 1.8 03/28/2019: ALT 11; AST 15; BUN 20; Creat 1.30; Hemoglobin 11.3; Platelets 350; Potassium 4.2; Sodium 141     Component Value Date/Time   CHOL 97 06/18/2018 0724   TRIG 62 06/18/2018 0724   HDL 36 (L) 06/18/2018 0724   CHOLHDL 2.7 06/18/2018 0724   VLDL 12  06/18/2018 0724   LDLCALC 49 06/18/2018 0724    Other Studies Reviewed Today:  Echocardiogram 06/18/2018: Study Conclusions  - Left ventricle: The cavity size was normal. Wall thickness was   normal. Systolic function was vigorous. The estimated ejection   fraction was in the range of 65% to 70%. Wall motion was normal;   there were no regional wall motion abnormalities. Left   ventricular diastolic function parameters were normal. - Ventricular septum: The contour showed diastolic flattening. - Aortic valve: Trileaflet; moderately thickened, moderately   calcified leaflets. Valve mobility was restricted. Valve area   (Vmax): 1.45 cm^2. - Tricuspid valve: There was mild regurgitation. - Pulmonary arteries: Systolic pressure was moderately increased.   PA peak pressure: 55 mm Hg (S).  Cardiac catheterization 06/20/2018:  Prox LAD lesion is 40% stenosed. Otherwise minimal CAD with a left dominant system.  The left ventricular systolic function is normal.  LV end diastolic pressure is moderately elevated.   SUMMARY:  Angiographically normal coronary arteries with only mild to moderate proximal LAD disease.  Left dominant system.  Normal LVEF with moderately elevated EDP.  Assessment and Plan:  1.  Paroxysmal atrial fibrillation.  She has continued on flecainide, low-dose Toprol-XL, and Eliquis.  Last device interrogation in May showed only 2 episodes of breakthrough atrial fibrillation.  She has a follow-up device check pending once her remote monitor is replaced.  Recent lab work reviewed.  2.  Dyspnea on exertion, possibly multifactorial.  She had only mild to moderate proximal LAD disease by recent cardiac catheterization in December 2019.  We will obtain a follow-up echocardiogram to ensure stability in LVEF.  She needs to have her pacemaker interrogated to assess atrial fibrillation burden and also make sure that pacing parameters do not require adjustment.  3.  Sick  sinus syndrome with Medtronic His bundle pacemaker in place.  4.  Essential hypertension, systolic is in the Q000111Q today.  Continue with current regimen.  Medication Adjustments/Labs and Tests Ordered: Current medicines are reviewed at length with the patient today.  Concerns regarding medicines are outlined above.   Tests Ordered: Orders Placed This Encounter  Procedures  . EKG 12-Lead  . ECHOCARDIOGRAM COMPLETE    Medication Changes:  No orders of the defined types were placed in this encounter.   Disposition:  Follow up 3 months in the [place] office.  Signed, Satira Sark, MD, [facility] 04/10/2019 1:18 PM    [facility] [address], [place], [place] [address] Phone: [phone]; Fax: [phone]

## 2019-04-13 ENCOUNTER — Ambulatory Visit (INDEPENDENT_AMBULATORY_CARE_PROVIDER_SITE_OTHER): Payer: Medicare HMO | Admitting: *Deleted

## 2019-04-13 DIAGNOSIS — I495 Sick sinus syndrome: Secondary | ICD-10-CM | POA: Diagnosis not present

## 2019-04-13 DIAGNOSIS — I5032 Chronic diastolic (congestive) heart failure: Secondary | ICD-10-CM

## 2019-04-13 NOTE — Telephone Encounter (Signed)
According to [facility] the pt monitor was shipped 04-10-2019

## 2019-04-14 LAB — CUP PACEART REMOTE DEVICE CHECK
Battery Remaining Longevity: 14 mo
Battery Voltage: 2.93 V
Brady Statistic AP VP Percent: 99.83 %
Brady Statistic AP VS Percent: 0 %
Brady Statistic AS VP Percent: 0.17 %
Brady Statistic AS VS Percent: 0 %
Brady Statistic RA Percent Paced: 99.8 %
Brady Statistic RV Percent Paced: 99.97 %
Date Time Interrogation Session: 20201009231310
Implantable Lead Implant Date: 20170829
Implantable Lead Implant Date: 20170829
Implantable Lead Location: 753859
Implantable Lead Location: 753860
Implantable Lead Model: 3830
Implantable Lead Model: 5076
Implantable Pulse Generator Implant Date: 20170829
Lead Channel Impedance Value: 247 Ohm
Lead Channel Impedance Value: 304 Ohm
Lead Channel Impedance Value: 418 Ohm
Lead Channel Impedance Value: 437 Ohm
Lead Channel Pacing Threshold Amplitude: 0.75 V
Lead Channel Pacing Threshold Amplitude: 2.5 V
Lead Channel Pacing Threshold Pulse Width: 0.4 ms
Lead Channel Pacing Threshold Pulse Width: 0.4 ms
Lead Channel Sensing Intrinsic Amplitude: 0.5 mV
Lead Channel Sensing Intrinsic Amplitude: 0.5 mV
Lead Channel Sensing Intrinsic Amplitude: 4 mV
Lead Channel Sensing Intrinsic Amplitude: 4 mV
Lead Channel Setting Pacing Amplitude: 2 V
Lead Channel Setting Pacing Amplitude: 5 V
Lead Channel Setting Pacing Pulse Width: 1 ms
Lead Channel Setting Sensing Sensitivity: 0.9 mV

## 2019-04-18 NOTE — Telephone Encounter (Signed)
Transmission received 04-13-2019

## 2019-04-23 NOTE — Progress Notes (Signed)
Remote pacemaker transmission.   

## 2019-04-30 DIAGNOSIS — E119 Type 2 diabetes mellitus without complications: Secondary | ICD-10-CM | POA: Diagnosis not present

## 2019-04-30 DIAGNOSIS — I509 Heart failure, unspecified: Secondary | ICD-10-CM | POA: Diagnosis not present

## 2019-04-30 DIAGNOSIS — J449 Chronic obstructive pulmonary disease, unspecified: Secondary | ICD-10-CM | POA: Diagnosis not present

## 2019-05-09 ENCOUNTER — Other Ambulatory Visit: Payer: Self-pay

## 2019-05-09 ENCOUNTER — Other Ambulatory Visit (HOSPITAL_COMMUNITY)
Admission: RE | Admit: 2019-05-09 | Discharge: 2019-05-09 | Disposition: A | Discharge status: Home / Self Care | Payer: Medicare HMO | Source: Ambulatory Visit | Attending: Internal Medicine | Admitting: Internal Medicine

## 2019-05-09 ENCOUNTER — Encounter (HOSPITAL_COMMUNITY)
Admission: RE | Admit: 2019-05-09 | Discharge: 2019-05-09 | Disposition: A | Discharge status: Home / Self Care | Payer: Medicare HMO | Source: Ambulatory Visit | Attending: Internal Medicine | Admitting: Internal Medicine

## 2019-05-09 DIAGNOSIS — Z01812 Encounter for preprocedural laboratory examination: Secondary | ICD-10-CM | POA: Insufficient documentation

## 2019-05-09 DIAGNOSIS — Z20828 Contact with and (suspected) exposure to other viral communicable diseases: Secondary | ICD-10-CM | POA: Diagnosis not present

## 2019-05-09 LAB — SARS CORONAVIRUS 2 (TAT 6-24 HRS): SARS Coronavirus 2: NEGATIVE

## 2019-05-10 ENCOUNTER — Other Ambulatory Visit: Payer: Medicare HMO

## 2019-05-11 ENCOUNTER — Other Ambulatory Visit: Payer: Self-pay

## 2019-05-11 ENCOUNTER — Ambulatory Visit (HOSPITAL_COMMUNITY)
Admission: RE | Admit: 2019-05-11 | Discharge: 2019-05-11 | Disposition: A | Discharge status: Home / Self Care | Payer: Medicare HMO | Attending: Internal Medicine | Admitting: Internal Medicine

## 2019-05-11 ENCOUNTER — Encounter (HOSPITAL_COMMUNITY)
Admission: RE | Disposition: A | Discharge status: Home / Self Care | Payer: Self-pay | Source: Home / Self Care | Attending: Internal Medicine

## 2019-05-11 ENCOUNTER — Encounter (HOSPITAL_COMMUNITY): Payer: Self-pay

## 2019-05-11 ENCOUNTER — Ambulatory Visit (HOSPITAL_COMMUNITY): Payer: Medicare HMO | Admitting: Anesthesiology

## 2019-05-11 ENCOUNTER — Other Ambulatory Visit (INDEPENDENT_AMBULATORY_CARE_PROVIDER_SITE_OTHER): Payer: Self-pay | Admitting: Internal Medicine

## 2019-05-11 DIAGNOSIS — E1022 Type 1 diabetes mellitus with diabetic chronic kidney disease: Secondary | ICD-10-CM | POA: Diagnosis not present

## 2019-05-11 DIAGNOSIS — I480 Paroxysmal atrial fibrillation: Secondary | ICD-10-CM | POA: Diagnosis not present

## 2019-05-11 DIAGNOSIS — I2510 Atherosclerotic heart disease of native coronary artery without angina pectoris: Secondary | ICD-10-CM | POA: Insufficient documentation

## 2019-05-11 DIAGNOSIS — I130 Hypertensive heart and chronic kidney disease with heart failure and stage 1 through stage 4 chronic kidney disease, or unspecified chronic kidney disease: Secondary | ICD-10-CM | POA: Diagnosis not present

## 2019-05-11 DIAGNOSIS — Z79899 Other long term (current) drug therapy: Secondary | ICD-10-CM | POA: Diagnosis not present

## 2019-05-11 DIAGNOSIS — F419 Anxiety disorder, unspecified: Secondary | ICD-10-CM | POA: Insufficient documentation

## 2019-05-11 DIAGNOSIS — N182 Chronic kidney disease, stage 2 (mild): Secondary | ICD-10-CM | POA: Insufficient documentation

## 2019-05-11 DIAGNOSIS — I495 Sick sinus syndrome: Secondary | ICD-10-CM | POA: Insufficient documentation

## 2019-05-11 DIAGNOSIS — R195 Other fecal abnormalities: Secondary | ICD-10-CM

## 2019-05-11 DIAGNOSIS — E669 Obesity, unspecified: Secondary | ICD-10-CM | POA: Diagnosis not present

## 2019-05-11 DIAGNOSIS — Z7901 Long term (current) use of anticoagulants: Secondary | ICD-10-CM | POA: Diagnosis not present

## 2019-05-11 DIAGNOSIS — R1310 Dysphagia, unspecified: Secondary | ICD-10-CM

## 2019-05-11 DIAGNOSIS — I5032 Chronic diastolic (congestive) heart failure: Secondary | ICD-10-CM | POA: Diagnosis not present

## 2019-05-11 DIAGNOSIS — Z880 Allergy status to penicillin: Secondary | ICD-10-CM | POA: Diagnosis not present

## 2019-05-11 DIAGNOSIS — Z9981 Dependence on supplemental oxygen: Secondary | ICD-10-CM | POA: Insufficient documentation

## 2019-05-11 DIAGNOSIS — E785 Hyperlipidemia, unspecified: Secondary | ICD-10-CM | POA: Diagnosis not present

## 2019-05-11 DIAGNOSIS — Z794 Long term (current) use of insulin: Secondary | ICD-10-CM | POA: Insufficient documentation

## 2019-05-11 DIAGNOSIS — Z885 Allergy status to narcotic agent status: Secondary | ICD-10-CM | POA: Insufficient documentation

## 2019-05-11 DIAGNOSIS — J449 Chronic obstructive pulmonary disease, unspecified: Secondary | ICD-10-CM | POA: Insufficient documentation

## 2019-05-11 DIAGNOSIS — E1051 Type 1 diabetes mellitus with diabetic peripheral angiopathy without gangrene: Secondary | ICD-10-CM | POA: Diagnosis not present

## 2019-05-11 DIAGNOSIS — R1314 Dysphagia, pharyngoesophageal phase: Secondary | ICD-10-CM | POA: Diagnosis not present

## 2019-05-11 DIAGNOSIS — K222 Esophageal obstruction: Secondary | ICD-10-CM | POA: Diagnosis not present

## 2019-05-11 DIAGNOSIS — M1990 Unspecified osteoarthritis, unspecified site: Secondary | ICD-10-CM | POA: Diagnosis not present

## 2019-05-11 DIAGNOSIS — Z8711 Personal history of peptic ulcer disease: Secondary | ICD-10-CM | POA: Insufficient documentation

## 2019-05-11 DIAGNOSIS — Z8249 Family history of ischemic heart disease and other diseases of the circulatory system: Secondary | ICD-10-CM | POA: Diagnosis not present

## 2019-05-11 DIAGNOSIS — Z9049 Acquired absence of other specified parts of digestive tract: Secondary | ICD-10-CM | POA: Diagnosis not present

## 2019-05-11 DIAGNOSIS — Z950 Presence of cardiac pacemaker: Secondary | ICD-10-CM | POA: Diagnosis not present

## 2019-05-11 DIAGNOSIS — I110 Hypertensive heart disease with heart failure: Secondary | ICD-10-CM | POA: Diagnosis not present

## 2019-05-11 DIAGNOSIS — K921 Melena: Secondary | ICD-10-CM | POA: Diagnosis not present

## 2019-05-11 HISTORY — PX: ESOPHAGOGASTRODUODENOSCOPY (EGD) WITH PROPOFOL: SHX5813

## 2019-05-11 LAB — GLUCOSE, CAPILLARY
Glucose-Capillary: 127 mg/dL — ABNORMAL HIGH (ref 70–99)
Glucose-Capillary: 115 mg/dL — ABNORMAL HIGH (ref 70–99)

## 2019-05-11 SURGERY — ESOPHAGOGASTRODUODENOSCOPY (EGD) WITH PROPOFOL
Anesthesia: General

## 2019-05-11 MED ORDER — PHENYLEPHRINE 40 MCG/ML (10ML) SYRINGE FOR IV PUSH (FOR BLOOD PRESSURE SUPPORT)
PREFILLED_SYRINGE | INTRAVENOUS | Status: AC
  Filled 2019-05-11: qty 10

## 2019-05-11 MED ORDER — PROMETHAZINE HCL 25 MG/ML IJ SOLN: 6.2500 mg | INTRAMUSCULAR | Status: DC | PRN

## 2019-05-11 MED ORDER — GLYCOPYRROLATE 0.2 MG/ML IJ SOLN
INTRAMUSCULAR | Status: DC | PRN
  Administered 2019-05-11: 0.1 mg via INTRAVENOUS

## 2019-05-11 MED ORDER — CHLORHEXIDINE GLUCONATE CLOTH 2 % EX PADS: 6.0000 | MEDICATED_PAD | Freq: Once | CUTANEOUS | Status: DC

## 2019-05-11 MED ORDER — EPHEDRINE SULFATE 50 MG/ML IJ SOLN
INTRAMUSCULAR | Status: DC | PRN
  Administered 2019-05-11: 5 mg via INTRAVENOUS
  Administered 2019-05-11: 10 mg via INTRAVENOUS

## 2019-05-11 MED ORDER — PROPOFOL 10 MG/ML IV BOLUS
INTRAVENOUS | Status: DC | PRN
  Administered 2019-05-11: 30 mg via INTRAVENOUS

## 2019-05-11 MED ORDER — KETAMINE HCL 50 MG/5ML IJ SOSY
PREFILLED_SYRINGE | INTRAMUSCULAR | Status: AC
  Filled 2019-05-11: qty 5

## 2019-05-11 MED ORDER — PHENYLEPHRINE 40 MCG/ML (10ML) SYRINGE FOR IV PUSH (FOR BLOOD PRESSURE SUPPORT)
PREFILLED_SYRINGE | INTRAVENOUS | Status: DC | PRN
  Administered 2019-05-11: 80 ug via INTRAVENOUS
  Administered 2019-05-11: 40 ug via INTRAVENOUS
  Administered 2019-05-11: 80 ug via INTRAVENOUS

## 2019-05-11 MED ORDER — GLYCOPYRROLATE PF 0.2 MG/ML IJ SOSY
PREFILLED_SYRINGE | INTRAMUSCULAR | Status: AC
  Filled 2019-05-11: qty 1

## 2019-05-11 MED ORDER — MIDAZOLAM HCL 2 MG/2ML IJ SOLN: 0.5000 mg | Freq: Once | INTRAMUSCULAR | Status: DC | PRN

## 2019-05-11 MED ORDER — LIDOCAINE HCL (CARDIAC) PF 100 MG/5ML IV SOSY
PREFILLED_SYRINGE | INTRAVENOUS | Status: DC | PRN
  Administered 2019-05-11: 40 mg via INTRATRACHEAL

## 2019-05-11 MED ORDER — LACTATED RINGERS IV SOLN
INTRAVENOUS | Status: DC
  Administered 2019-05-11: 1000 mL via INTRAVENOUS

## 2019-05-11 MED ORDER — PROPOFOL 500 MG/50ML IV EMUL
INTRAVENOUS | Status: DC | PRN
  Administered 2019-05-11: 125 ug/kg/min via INTRAVENOUS

## 2019-05-11 MED ORDER — EPHEDRINE 5 MG/ML INJ
INTRAVENOUS | Status: AC
  Filled 2019-05-11: qty 10

## 2019-05-11 MED ORDER — KETAMINE HCL 10 MG/ML IJ SOLN
INTRAMUSCULAR | Status: DC | PRN
  Administered 2019-05-11: 10 mg via INTRAVENOUS
  Administered 2019-05-11: 20 mg via INTRAVENOUS

## 2019-05-11 NOTE — Op Note (Signed)
[facility] Patient Name: Miranda Sexton Procedure Date: 05/11/2019 12:32 PM MRN: [id number] Date of Birth: 01-19-1941 Attending MD: Hildred Laser , MD CSN: [id number] Age: 78 Admit Type: Ambulatory Procedure:                Upper GI endoscopy Indications:              Esophageal dysphagia, Melena; h/o GU. Providers:                Hildred Laser, MD, Janeece Riggers, RN, Randa Spike,                            Technician Referring MD:             Glenda Chroman, MD Medicines:                Propofol per Anesthesia Complications:            No immediate complications. Estimated Blood Loss:     Estimated blood loss: none. Procedure:                Pre-Anesthesia Assessment:                           - Prior to the procedure, a History and Physical                            was performed, and patient medications and                            allergies were reviewed. The patient's tolerance of                            previous anesthesia was also reviewed. The risks                            and benefits of the procedure and the sedation                            options and risks were discussed with the patient.                            All questions were answered, and informed consent                            was obtained. Prior Anticoagulants: The patient                            last took Eliquis (apixaban) 3 days prior to the                            procedure. ASA Grade Assessment: IV - A patient                            with severe systemic disease that is a constant  threat to life. After reviewing the risks and                            benefits, the patient was deemed in satisfactory                            condition to undergo the procedure.                           After obtaining informed consent, the endoscope was                            passed under direct vision. Throughout the                            procedure, the  patient's blood pressure, pulse, and                            oxygen saturations were monitored continuously. The                            [id number]) was introduced through the                            mouth, and advanced to the second part of duodenum.                            The upper GI endoscopy was accomplished without                            difficulty. The patient tolerated the procedure                            well. The upper GI endoscopy was technically                            difficult and complex due to narrowing. Successful                            completion of the procedure was aided by                            withdrawing the scope and replacing with the                            'babyscope'. The patient tolerated the procedure                            well. Scope In: 1:39:29 PM Scope Out: 2:01:11 PM Total Procedure Duration: 0 hours 21 minutes 42 seconds  Findings:      The hypopharynx was normal.      One benign-appearing, intrinsic severe stenosis was found 20 cm from the       incisors. The stenosis was traversed after downsizing scope. Stricture  dilated by passing 32, 34, 36 and 42 Fr. Maloney dilators. The dilation       site was examined following endoscope reinsertion and showed no change,       mild improvement in luminal narrowing and no bleeding, mucosal tear or       perforation.      The exam of the esophagus was otherwise normal.      The Z-line was regular and was found 41 cm from the incisors.      The entire examined stomach was normal.      The duodenal bulb and second portion of the duodenum were normal. Impression:               - Normal hypopharynx.                           - Benign-appearing highgrade proximal esophageal                            stenosis. Dilated.                           - Z-line regular, 41 cm from the incisors.                           - Normal stomach. Gastric ulcer has completely                             healed.                           - Normal duodenal bulb and second portion of the                            duodenum.                           - No specimens collected. Moderate Sedation:      Per Anesthesia Care Recommendation:           - Patient has a contact number available for                            emergencies. The signs and symptoms of potential                            delayed complications were discussed with the                            patient. Return to normal activities tomorrow.                            Written discharge instructions were provided to the                            patient.                           - Mechanical soft diet today.                           -  Continue present medications.                           - Resume Eliquis (apixaban) at prior dose tomorrow.                           - Esophagogram to be scheduled. Procedure Code(s):        --- Professional ---                           [phone], Esophagogastroduodenoscopy, flexible,                            transoral; diagnostic, including collection of                            specimen(s) by brushing or washing, when performed                            (separate procedure) Diagnosis Code(s):        --- Professional ---                           K22.2, Esophageal obstruction                           R13.14, Dysphagia, pharyngoesophageal phase                           K92.1, Melena (includes Hematochezia) CPT copyright 2019 American Medical Association. All rights reserved. The codes documented in this report are preliminary and upon coder review may  be revised to meet current compliance requirements. Hildred Laser, MD Hildred Laser, MD 05/11/2019 2:17:22 PM This report has been signed electronically. Number of Addenda: 0

## 2019-05-11 NOTE — Anesthesia Preprocedure Evaluation (Signed)
Anesthesia Evaluation  Patient identified by MRN, date of birth, ID band Patient awake    Reviewed: Allergy & Precautions, NPO status , Patient's Chart, lab work & pertinent test results, reviewed documented beta blocker date and time   Airway Mallampati: I  TM Distance: >3 FB Neck ROM: Full    Dental no notable dental hx. (+) Teeth Intact   Pulmonary asthma , COPD,  COPD inhaler,    Pulmonary exam normal breath sounds clear to auscultation       Cardiovascular Exercise Tolerance: Poor hypertension, Pt. on medications and Pt. on home beta blockers + angina with exertion + CAD and + Peripheral Vascular Disease  Normal cardiovascular exam+ pacemaker II Rhythm:Regular Rate:Normal  States last NTG ~6 months prior  Poor ET States doesn't leave house much    Neuro/Psych  Headaches, Anxiety negative psych ROS   GI/Hepatic Neg liver ROS, GERD  Medicated and Controlled,  Endo/Other  negative endocrine ROSdiabetes, Type 1, Insulin Dependent, Oral Hypoglycemic Agents  Renal/GU Renal InsufficiencyRenal disease  negative genitourinary   Musculoskeletal  (+) Arthritis ,   Abdominal   Peds negative pediatric ROS (+)  Hematology negative hematology ROS (+) anemia ,   Anesthesia Other Findings   Reproductive/Obstetrics negative OB ROS                             Anesthesia Physical Anesthesia Plan  ASA: IV  Anesthesia Plan: General   Post-op Pain Management:    Induction: Intravenous  PONV Risk Score and Plan: 3 and TIVA, Propofol infusion, Treatment may vary due to age or medical condition and Ondansetron  Airway Management Planned: Simple Face Mask and Nasal Cannula  Additional Equipment:   Intra-op Plan:   Post-operative Plan:   Informed Consent: I have reviewed the patients History and Physical, chart, labs and discussed the procedure including the risks, benefits and alternatives for  the proposed anesthesia with the patient or authorized representative who has indicated his/her understanding and acceptance.     Dental advisory given  Plan Discussed with: CRNA  Anesthesia Plan Comments: (Plan Full PPE use  Plan GA with GETA as needed d/w pt -WTP with same after Q&A  )        Anesthesia Quick Evaluation

## 2019-05-11 NOTE — Anesthesia Postprocedure Evaluation (Signed)
Anesthesia Post Note  Patient: Miranda Sexton  Procedure(s) Performed: ESOPHAGOGASTRODUODENOSCOPY (EGD) WITH PROPOFOL (N/A )  Patient location during evaluation: PACU Anesthesia Type: General Level of consciousness: awake and alert Pain management: pain level controlled Vital Signs Assessment: post-procedure vital signs reviewed and stable Respiratory status: spontaneous breathing, respiratory function stable and nonlabored ventilation Cardiovascular status: stable Anesthetic complications: no     Last Vitals:  Vitals:   05/11/19 1234  BP: (!) 111/49  Temp: 36.8 C  SpO2: 96%    Last Pain:  Vitals:   05/11/19 1234  TempSrc: Oral  PainSc: 0-No pain                 Miranda Sexton

## 2019-05-11 NOTE — Discharge Instructions (Signed)
Resume Eliquis on 05/12/2019. Resume other medications as before. Resume usual diet.  Remember to eat slowly and chew food thoroughly. No driving for 24 hours. Esophagogram to be scheduled.  Office will call.

## 2019-05-11 NOTE — Transfer of Care (Signed)
Immediate Anesthesia Transfer of Care Note  Patient: Miranda Sexton  Procedure(s) Performed: ESOPHAGOGASTRODUODENOSCOPY (EGD) WITH PROPOFOL (N/A )  Patient Location: PACU  Anesthesia Type:General  Level of Consciousness: alert  and oriented  Airway & Oxygen Therapy: Patient Spontanous Breathing  Post-op Assessment: Report given to RN, Post -op Vital signs reviewed and stable and Patient moving all extremities  Post vital signs: Reviewed and stable  Last Vitals:  Vitals Value Taken Time  BP    Temp    Pulse 71 05/11/19 1411  Resp 21 05/11/19 1411  SpO2 97 % 05/11/19 1411  Vitals shown include unvalidated device data.  Last Pain:  Vitals:   05/11/19 1234  TempSrc: Oral  PainSc: 0-No pain      Patients Stated Pain Goal: 8 (Q000111Q [id number])  Complications: No apparent anesthesia complications

## 2019-05-11 NOTE — H&P (Signed)
Miranda Sexton is an 78 y.o. female.   Chief Complaint: Patient is here for esophagogastroduodenoscopy and esophageal dilation. HPI: Patient is 78 year old Afro-American female presented to the office few weeks ago with 25-month history of intermittent solid food dysphagia and she also reported melena which cleared after a few days.  No history of nausea vomiting or abdominal pain.  She complains of pain in the region of left hip where she feels like there is a lump there.  She is on Eliquis which is on hold for the procedure.  She does not take aspirin or other OTC NSAIDs.  She has good appetite and has not lost any weight. She was evaluated last year for iron deficiency anemia and she was found to have gastric ulcer without stigmata of bleed.  Biopsy revealed benign etiology and H. pylori.  She was advised to follow-up EGD 10 weeks later was not done.  She had large tubulovillous adenoma with high-grade dysplasia at ascending colon and underwent right hemicolectomy in April 2019.  This polyp measured over 4 cm and did not have carcinoma. Patient's hemoglobin on 03/28/2019 was 11.3 and 36.1.  Past Medical History:  Diagnosis Date  . Anxiety   . Asthma   . Carotid artery occlusion   . Chronic bronchitis ([place])   . Chronic diastolic heart failure ([place])   . CKD (chronic kidney disease) stage 2, GFR 60-89 ml/min   . COPD (chronic obstructive pulmonary disease) ([place])   . Coronary atherosclerosis of native coronary artery    Nonobstructive 08/2008  . Daily headache   . DJD (degenerative joint disease)   . Essential hypertension   . GERD (gastroesophageal reflux disease)   . Hyperlipidemia   . Obesity   . On home oxygen therapy   . PAD (peripheral artery disease) (H[facility])    Moderate right renal artery stenosis 08/2008  . Paroxysmal atrial fibrillation (HCC)   . Sick sinus syndrome (HCC)    Medtronic dual-chamber his bundle pacemaker - Dr. Rayann Heman  . Type 2 diabetes mellitus ([place])     Past  Surgical History:  Procedure Laterality Date  . BIOPSY  08/03/2017   Procedure: BIOPSY;  Surgeon: [name], MD;  [facility];  Service: Endoscopy;;  gastric   . CARDIAC CATHETERIZATION  2014  . CATARACT EXTRACTION W/ INTRAOCULAR LENS  IMPLANT, BILATERAL Bilateral 2008  . COLONOSCOPY N/A 08/03/2017   Procedure: COLONOSCOPY;  Surgeon: [name], MD;  [facility];  Service: Endoscopy;  Laterality: N/A;  . ENDARTERECTOMY Right 12/08/2012   Procedure: ENDARTERECTOMY CAROTID;  Surgeon: [name], MD;  Location: [facility];  Service: Vascular;  Laterality: Right;  . EP IMPLANTABLE DEVICE N/A 03/02/2016   Procedure: Pacemaker Implant;  Surgeon: [name], MD;  Location: [facility];  Service: Cardiovascular;  Laterality: N/A;  . ESOPHAGOGASTRODUODENOSCOPY N/A 08/03/2017   Procedure: ESOPHAGOGASTRODUODENOSCOPY (EGD);  Surgeon: [name], MD;  [facility];  Service: Endoscopy;  Laterality: N/A;  . INSERT / REPLACE / REMOVE PACEMAKER  03/02/2016  . LEFT HEART CATH AND CORONARY ANGIOGRAPHY N/A 06/20/2018   Procedure: LEFT HEART CATH AND CORONARY ANGIOGRAPHY;  Surgeon: [name], MD;  Location: [facility];  Service: Cardiovascular;  Laterality: N/A;  . PARTIAL COLECTOMY N/A 10/19/2017   Procedure: PARTIAL COLECTOMY;  Surgeon: [name], MD;  [facility];  Service: General;  Laterality: N/A;  . POLYPECTOMY  08/03/2017   Procedure: POLYPECTOMY;  Surgeon: Hildred Laser  U, MD;  [facility];  Service: Endoscopy;;  colon    Family History  Problem Relation Age of Onset  . Cancer Mother        Stomach  . Deep vein thrombosis Mother   . Diabetes Mother   . Hypertension Mother   . Heart disease Mother   . Hypertension Father   . Diabetes Sister   . Hyperlipidemia Sister   . Hypertension Sister   . Heart disease Sister   . Diabetes Brother   . Hyperlipidemia Brother   . Hypertension Brother    Social  History:  reports that she has never smoked. She has never used smokeless tobacco. She reports that she does not drink alcohol or use drugs.  Allergies:  Allergies  Allergen Reactions  . Codeine Nausea And Vomiting  . Penicillins Rash and Other (See Comments)    Has patient had a PCN reaction causing immediate rash, facial/tongue/throat swelling, SOB or lightheadedness with hypotension: Yes Has patient had a PCN reaction causing severe rash involving mucus membranes or skin necrosis: No Has patient had a PCN reaction that required hospitalization No Has patient had a PCN reaction occurring within the last 10 years: No If all of the above answers are "NO", then may proceed with Cephalosporin use.     Medications Prior to Admission  Medication Sig Dispense Refill  . acetaminophen (TYLENOL) 325 MG tablet Take 325 mg by mouth every 6 (six) hours as needed for mild pain or headache.    . albuterol (PROAIR HFA) 108 (90 Base) MCG/ACT inhaler Inhale 2 puffs into the lungs every 6 (six) hours as needed for wheezing or shortness of breath.     Marland Kitchen atorvastatin (LIPITOR) 40 MG tablet Take 40 mg by mouth daily.    . benazepril (LOTENSIN) 40 MG tablet Take 40 mg by mouth daily.    Marland Kitchen dicyclomine (BENTYL) 10 MG capsule Take 10 mg by mouth 2 (two) times daily.     Marland Kitchen ELIQUIS 5 MG TABS tablet TAKE 1 TABLET BY MOUTH TWICE DAILY (Patient taking differently: Take 5 mg by mouth 2 (two) times daily. ) 60 tablet 5  . flecainide (TAMBOCOR) 100 MG tablet Take 1 tablet (100 mg total) by mouth 2 (two) times daily. 60 tablet 6  . furosemide (LASIX) 40 MG tablet take 1 tablet by mouth once daily (Patient taking differently: Take 40 mg by mouth daily. ) 15 tablet 0  . glipiZIDE (GLUCOTROL XL) 10 MG 24 hr tablet Take 10 mg by mouth daily.     . insulin aspart protamine- aspart (NOVOLOG 70/30) (70-30) 100 UNIT/ML injection Inject 46-48 Units into the skin See admin instructions. Inject 46 units in the morning & 48 units in  the evening.    . metoprolol succinate (TOPROL-XL) 25 MG 24 hr tablet TAKE 1/2 TABLET(12.5MG ) BY MOUTH DAILY (Patient taking differently: Take 12.5 mg by mouth daily. ) 45 tablet 1  . olopatadine (PATANOL) 0.1 % ophthalmic solution Place 1 drop into both eyes 2 (two) times daily.     . potassium chloride SA (K-DUR) 20 MEQ tablet Take 1 tablet (20 mEq total) by mouth daily. 90 tablet 0  . venlafaxine XR (EFFEXOR-XR) 150 MG 24 hr capsule Take 150 mg by mouth daily.    . vitamin B-12 (CYANOCOBALAMIN) 50 MCG tablet Take 50 mcg by mouth daily.    . nitroGLYCERIN (NITROSTAT) 0.4 MG SL tablet Place 1 tablet (0.4 mg total) under the tongue every 5 (five) minutes  as needed for chest pain. 30 tablet 2  . pantoprazole (PROTONIX) 40 MG tablet TAKE 1 TABLET BY MOUTH TWICE DAILY BEFORE MEALS (Patient not taking: Reported on 05/04/2019) 60 tablet 5    Results for orders placed or performed during the hospital encounter of 05/11/19 (from the past 48 hour(s))  Glucose, capillary     Status: Abnormal   Collection Time: 05/11/19 12:43 PM  Result Value Ref Range   Glucose-Capillary 127 (H) 70 - 99 mg/dL   No results found.  ROS  Blood pressure (!) 111/49, temperature 98.3 F (36.8 C), temperature source Oral, SpO2 96 %. Physical Exam  Constitutional: She appears well-developed and well-nourished.  HENT:  Mouth/Throat: Oropharynx is clear and moist.  Eyes: Conjunctivae are normal. No scleral icterus.  Neck: No thyromegaly present.  Cardiovascular: Normal rate, regular rhythm and normal heart sounds.  No murmur heard. Respiratory: Effort normal and breath sounds normal.  GI:  Abdomen is full.  On palpation is soft and nontender with organomegaly or masses.  She does not have any lump in the region of right hip iliac spine area.  I just feel subcutaneous adipose  tissue.  Lymphadenopathy:    She has no cervical adenopathy.     Assessment/Plan Esophageal dysphagia and history of melena. History of  gastric ulcer diagnosed in January 2019 without follow-up to document healing. Eliquis is on hold. Esophagogastroduodenoscopy with esophageal dilation.  Hildred Laser, MD 05/11/2019, 1:19 PM

## 2019-05-13 NOTE — Telephone Encounter (Signed)
error 

## 2019-05-14 ENCOUNTER — Other Ambulatory Visit (INDEPENDENT_AMBULATORY_CARE_PROVIDER_SITE_OTHER): Payer: Self-pay | Admitting: *Deleted

## 2019-05-14 DIAGNOSIS — R1310 Dysphagia, unspecified: Secondary | ICD-10-CM

## 2019-05-15 NOTE — Addendum Note (Signed)
Addended by: Grayland Ormond on: 05/15/2019 09:35 AM   Modules accepted: Orders

## 2019-05-16 ENCOUNTER — Encounter (HOSPITAL_COMMUNITY): Payer: Self-pay | Admitting: Internal Medicine

## 2019-05-16 ENCOUNTER — Other Ambulatory Visit: Payer: Self-pay

## 2019-05-16 ENCOUNTER — Ambulatory Visit (INDEPENDENT_AMBULATORY_CARE_PROVIDER_SITE_OTHER): Payer: Medicare HMO

## 2019-05-16 DIAGNOSIS — R0609 Other forms of dyspnea: Secondary | ICD-10-CM

## 2019-05-16 DIAGNOSIS — R0600 Dyspnea, unspecified: Secondary | ICD-10-CM

## 2019-05-17 DIAGNOSIS — R69 Illness, unspecified: Secondary | ICD-10-CM | POA: Diagnosis not present

## 2019-05-18 ENCOUNTER — Telehealth: Payer: Self-pay | Admitting: *Deleted

## 2019-05-18 ENCOUNTER — Other Ambulatory Visit: Payer: Self-pay | Admitting: *Deleted

## 2019-05-18 DIAGNOSIS — I5032 Chronic diastolic (congestive) heart failure: Secondary | ICD-10-CM

## 2019-05-18 MED ORDER — FUROSEMIDE 40 MG PO TABS: 60.0000 mg | ORAL_TABLET | Freq: Every day | ORAL | 1 refills | Status: DC

## 2019-05-18 NOTE — Telephone Encounter (Signed)
-----   Message from Satira Sark, MD sent at 05/17/2019  7:56 AM EST ----- Results reviewed.  LVEF vigorous at 65 to 70% with increased filling pressures.  Moderate mitral regurgitation.  I wonder if she might do better on a higher dose of diuretic, increase Lasix to 60 mg daily, continue potassium supplements.  Follow-up BMET in 10 days.

## 2019-05-18 NOTE — Telephone Encounter (Signed)
Patient informed and verbalized understanding of plan. Copy sent to PCP 

## 2019-05-22 ENCOUNTER — Other Ambulatory Visit: Payer: Self-pay

## 2019-05-22 ENCOUNTER — Ambulatory Visit (HOSPITAL_COMMUNITY)
Admission: RE | Admit: 2019-05-22 | Discharge: 2019-05-22 | Disposition: A | Discharge status: Home / Self Care | Payer: Medicare HMO | Source: Ambulatory Visit | Attending: Internal Medicine | Admitting: Internal Medicine

## 2019-05-22 DIAGNOSIS — R1310 Dysphagia, unspecified: Secondary | ICD-10-CM | POA: Diagnosis not present

## 2019-05-24 ENCOUNTER — Other Ambulatory Visit (INDEPENDENT_AMBULATORY_CARE_PROVIDER_SITE_OTHER): Payer: Self-pay | Admitting: Internal Medicine

## 2019-05-24 DIAGNOSIS — R69 Illness, unspecified: Secondary | ICD-10-CM | POA: Diagnosis not present

## 2019-05-24 MED ORDER — PYLERA 140-125-125 MG PO CAPS: 3.0000 | ORAL_CAPSULE | Freq: Three times a day (TID) | ORAL | 0 refills | Status: DC

## 2019-05-25 ENCOUNTER — Other Ambulatory Visit (INDEPENDENT_AMBULATORY_CARE_PROVIDER_SITE_OTHER): Payer: Self-pay | Admitting: *Deleted

## 2019-05-25 DIAGNOSIS — D508 Other iron deficiency anemias: Secondary | ICD-10-CM

## 2019-05-25 DIAGNOSIS — R195 Other fecal abnormalities: Secondary | ICD-10-CM

## 2019-05-28 ENCOUNTER — Other Ambulatory Visit (INDEPENDENT_AMBULATORY_CARE_PROVIDER_SITE_OTHER): Payer: Self-pay | Admitting: *Deleted

## 2019-05-28 DIAGNOSIS — R195 Other fecal abnormalities: Secondary | ICD-10-CM

## 2019-05-28 DIAGNOSIS — D508 Other iron deficiency anemias: Secondary | ICD-10-CM

## 2019-05-28 DIAGNOSIS — I5032 Chronic diastolic (congestive) heart failure: Secondary | ICD-10-CM | POA: Diagnosis not present

## 2019-05-29 ENCOUNTER — Telehealth: Payer: Self-pay | Admitting: *Deleted

## 2019-05-29 DIAGNOSIS — I5032 Chronic diastolic (congestive) heart failure: Secondary | ICD-10-CM

## 2019-05-29 LAB — BASIC METABOLIC PANEL
BUN/Creatinine Ratio: 15 (calc) (ref 6–22)
BUN: 21 mg/dL (ref 7–25)
CO2: 29 mmol/L (ref 20–32)
Calcium: 9.3 mg/dL (ref 8.6–10.4)
Chloride: 100 mmol/L (ref 98–110)
Creat: 1.44 mg/dL — ABNORMAL HIGH (ref 0.60–0.93)
Glucose, Bld: 197 mg/dL — ABNORMAL HIGH (ref 65–139)
Potassium: 4.6 mmol/L (ref 3.5–5.3)
Sodium: 139 mmol/L (ref 135–146)

## 2019-05-29 NOTE — Telephone Encounter (Signed)
-----   Message from Satira Sark, MD sent at 05/29/2019  8:01 AM EST ----- Results reviewed.  Creatinine 1.44 up from 1.30 with normal potassium.  We increased Lasix at last visit.  Continue same for now but make sure she has a BMET for next office visit.

## 2019-05-29 NOTE — Telephone Encounter (Signed)
Patient informed and verbalized understanding of plan. Lab order faxed to [facility]. Copy sent to PCP

## 2019-05-30 ENCOUNTER — Other Ambulatory Visit: Payer: Self-pay

## 2019-05-30 ENCOUNTER — Other Ambulatory Visit: Payer: Self-pay | Admitting: *Deleted

## 2019-05-30 ENCOUNTER — Encounter: Payer: Self-pay | Admitting: Internal Medicine

## 2019-05-30 ENCOUNTER — Ambulatory Visit (INDEPENDENT_AMBULATORY_CARE_PROVIDER_SITE_OTHER): Payer: Medicare HMO | Admitting: Internal Medicine

## 2019-05-30 VITALS — BP 148/72 | HR 78 | Ht 65.0 in | Wt 228.0 lb

## 2019-05-30 DIAGNOSIS — R0600 Dyspnea, unspecified: Secondary | ICD-10-CM | POA: Diagnosis not present

## 2019-05-30 DIAGNOSIS — R0602 Shortness of breath: Secondary | ICD-10-CM | POA: Diagnosis not present

## 2019-05-30 DIAGNOSIS — I10 Essential (primary) hypertension: Secondary | ICD-10-CM

## 2019-05-30 DIAGNOSIS — I509 Heart failure, unspecified: Secondary | ICD-10-CM | POA: Diagnosis not present

## 2019-05-30 DIAGNOSIS — I480 Paroxysmal atrial fibrillation: Secondary | ICD-10-CM

## 2019-05-30 DIAGNOSIS — E119 Type 2 diabetes mellitus without complications: Secondary | ICD-10-CM | POA: Diagnosis not present

## 2019-05-30 DIAGNOSIS — J449 Chronic obstructive pulmonary disease, unspecified: Secondary | ICD-10-CM | POA: Diagnosis not present

## 2019-05-30 DIAGNOSIS — R0609 Other forms of dyspnea: Secondary | ICD-10-CM

## 2019-05-30 LAB — CUP PACEART INCLINIC DEVICE CHECK
Date Time Interrogation Session: 20201125112623
Implantable Lead Implant Date: 20170829
Implantable Lead Implant Date: 20170829
Implantable Lead Location: 753859
Implantable Lead Location: 753860
Implantable Lead Model: 3830
Implantable Lead Model: 5076
Implantable Pulse Generator Implant Date: 20170829

## 2019-05-30 MED ORDER — FLECAINIDE ACETATE 50 MG PO TABS: 50.0000 mg | ORAL_TABLET | Freq: Two times a day (BID) | ORAL | 6 refills | Status: DC

## 2019-05-30 MED ORDER — FLECAINIDE ACETATE 50 MG PO TABS: 50.0000 mg | ORAL_TABLET | Freq: Two times a day (BID) | ORAL | 3 refills | Status: DC

## 2019-05-30 NOTE — Progress Notes (Signed)
PCP: Glenda Chroman, MD Primary Cardiologist: Domenic Polite Primary EP:  Dr Rayann Heman  Miranda Sexton is a 78 y.o. female who presents today for routine electrophysiology followup.  Since last being seen in our clinic, the patient reports doing reasonably well. She has SOB with minimal exertion.  She is using her husbands O2 at times  Today, she denies symptoms of palpitations, chest pain, shortness of breath,  lower extremity edema, dizziness, presyncope, or syncope.  The patient is otherwise without complaint today.   Past Medical History:  Diagnosis Date  . Anxiety   . Asthma   . Carotid artery occlusion   . Chronic bronchitis ([place])   . Chronic diastolic heart failure ([place])   . CKD (chronic kidney disease) stage 2, GFR 60-89 ml/min   . COPD (chronic obstructive pulmonary disease) ([place])   . Coronary atherosclerosis of native coronary artery    Nonobstructive 08/2008  . Daily headache   . DJD (degenerative joint disease)   . Essential hypertension   . GERD (gastroesophageal reflux disease)   . Hyperlipidemia   . Obesity   . On home oxygen therapy   . PAD (peripheral artery disease) (H[facility])    Moderate right renal artery stenosis 08/2008  . Paroxysmal atrial fibrillation (H[facility])   . Sick sinus syndrome (H[facility])    Medtronic dual-chamber his bundle pacemaker - Dr. Rayann Heman  . Type 2 diabetes mellitus ([place])    Past Surgical History:  Procedure Laterality Date  . BIOPSY  08/03/2017   Procedure: BIOPSY;  Surgeon: [name], MD;  [facility];  Service: Endoscopy;;  gastric   . CARDIAC CATHETERIZATION  2014  . CATARACT EXTRACTION W/ INTRAOCULAR LENS  IMPLANT, BILATERAL Bilateral 2008  . COLONOSCOPY N/A 08/03/2017   Procedure: COLONOSCOPY;  Surgeon: [name], MD;  [facility];  Service: Endoscopy;  Laterality: N/A;  . ENDARTERECTOMY Right 12/08/2012   Procedure: ENDARTERECTOMY CAROTID;  Surgeon: [name], MD;  Location: [facility];  Service: Vascular;   Laterality: Right;  . EP IMPLANTABLE DEVICE N/A 03/02/2016   Procedure: Pacemaker Implant;  Surgeon: [name], MD;  Location: [facility];  Service: Cardiovascular;  Laterality: N/A;  . ESOPHAGOGASTRODUODENOSCOPY N/A 08/03/2017   Procedure: ESOPHAGOGASTRODUODENOSCOPY (EGD);  Surgeon: [name], MD;  [facility];  Service: Endoscopy;  Laterality: N/A;  . ESOPHAGOGASTRODUODENOSCOPY (EGD) WITH PROPOFOL N/A 05/11/2019   Procedure: ESOPHAGOGASTRODUODENOSCOPY (EGD) WITH PROPOFOL;  Surgeon: [name], MD;  [facility];  Service: Endoscopy;  Laterality: N/A;  145pm  . INSERT / REPLACE / REMOVE PACEMAKER  03/02/2016  . LEFT HEART CATH AND CORONARY ANGIOGRAPHY N/A 06/20/2018   Procedure: LEFT HEART CATH AND CORONARY ANGIOGRAPHY;  Surgeon: [name], MD;  Location: [facility];  Service: Cardiovascular;  Laterality: N/A;  . PARTIAL COLECTOMY N/A 10/19/2017   Procedure: PARTIAL COLECTOMY;  Surgeon: [name], MD;  [facility];  Service: General;  Laterality: N/A;  . POLYPECTOMY  08/03/2017   Procedure: POLYPECTOMY;  Surgeon: [name], MD;  [facility];  Service: Endoscopy;;  colon    ROS- all systems are reviewed and negative except as per HPI above  Current Outpatient Medications  Medication Sig Dispense Refill  . acetaminophen (TYLENOL) 325 MG tablet Take 325 mg by mouth every 6 (six) hours as needed for mild pain or headache.    . albuterol (PROAIR HFA) 108 (90 Base) MCG/ACT inhaler Inhale 2  puffs into the lungs every 6 (six) hours as needed for wheezing or shortness of breath.     Marland Kitchen atorvastatin (LIPITOR) 40 MG tablet Take 40 mg by mouth daily.    . benazepril (LOTENSIN) 40 MG tablet Take 40 mg by mouth daily.    Marland Kitchen bismuth-metronidazole-tetracycline (PYLERA) 140-125-125 MG capsule Take 3 capsules by mouth 4 (four) times daily -  before meals and at bedtime. 120 capsule 0  . dicyclomine (BENTYL) 10 MG capsule Take  10 mg by mouth 2 (two) times daily.     Marland Kitchen ELIQUIS 5 MG TABS tablet TAKE 1 TABLET BY MOUTH TWICE DAILY (Patient taking differently: Take 5 mg by mouth 2 (two) times daily. ) 60 tablet 5  . flecainide (TAMBOCOR) 100 MG tablet Take 1 tablet (100 mg total) by mouth 2 (two) times daily. 60 tablet 6  . furosemide (LASIX) 40 MG tablet Take 1.5 tablets (60 mg total) by mouth daily. 135 tablet 1  . glipiZIDE (GLUCOTROL XL) 10 MG 24 hr tablet Take 10 mg by mouth daily.     . insulin aspart protamine- aspart (NOVOLOG 70/30) (70-30) 100 UNIT/ML injection Inject 46-48 Units into the skin See admin instructions. Inject 46 units in the morning & 48 units in the evening.    . metoprolol succinate (TOPROL-XL) 25 MG 24 hr tablet TAKE 1/2 TABLET(12.5MG ) BY MOUTH DAILY (Patient taking differently: Take 12.5 mg by mouth daily. ) 45 tablet 1  . nitroGLYCERIN (NITROSTAT) 0.4 MG SL tablet Place 1 tablet (0.4 mg total) under the tongue every 5 (five) minutes as needed for chest pain. 30 tablet 2  . olopatadine (PATANOL) 0.1 % ophthalmic solution Place 1 drop into both eyes 2 (two) times daily.     . pantoprazole (PROTONIX) 40 MG tablet TAKE 1 TABLET BY MOUTH TWICE DAILY BEFORE MEALS 60 tablet 5  . potassium chloride SA (K-DUR) 20 MEQ tablet Take 1 tablet (20 mEq total) by mouth daily. 90 tablet 0  . venlafaxine XR (EFFEXOR-XR) 150 MG 24 hr capsule Take 150 mg by mouth daily.    . vitamin B-12 (CYANOCOBALAMIN) 50 MCG tablet Take 50 mcg by mouth daily.     No current facility-administered medications for this visit.     Physical Exam: Vitals:   05/30/19 1104  BP: (!) 148/72  Pulse: 78  SpO2: 90%  Weight: 228 lb (103.4 kg)  Height: 5\' 5"  (1.651 m)    GEN- The patient is overweight appearing, alert and oriented x 3 today.   Head- normocephalic, atraumatic Eyes-  Sclera clear, conjunctiva pink Ears- hearing intact Oropharynx- clear Lungs- normal work of breathing Chest- pacemaker pocket is well healed Heart-  Regular rate and rhythm  GI- soft, NT, ND, + BS Extremities- no clubbing, cyanosis, or edema  Pacemaker interrogation- reviewed in detail today,  See PACEART report   Echo 05/16/19- EF 65%, moderate MR  Assessment and Plan:  1. Symptomatic sinus bradycardia and complete heart block Normal pacemaker function See Pace Art report V threshold is 3V @ 1 msec today, which is stable for her.  She again wishes to defer RV lead revision, though she has only 13 months battery longevity. We will follow closely AV delay was lengthened to 150/130 (from 130/100) for symptoms of SOB She has a V escape today.  2. Paroxymsal afib Maintaining sinus rhythm (afib burden was 0.7% with no episodes since 10/20) chads2vasc score is 6 She is on eliquis Reduce flecainide to 50mg  BID  3. HTN Stable  No change required today  4. SOB/ fatigue Unclear etiolgy Echo reveals moderate MR but with preserved EF Obtain PFTs AV interval adjusted today Reduce flecainide as above  Return to see me in 3 months  [name] MD, [facility] 05/30/2019 11:13 AM

## 2019-05-30 NOTE — Patient Instructions (Signed)
Medication Instructions:   Decrease Flecainide to 50mg  twice a day.  Continue all other medications.    Labwork: none  Testing/Procedures:  Your physician has recommended that you have a pulmonary function test. Pulmonary Function Tests are a group of tests that measure how well air moves in and out of your lungs.  Office will contact with results via phone or letter.    Follow-Up: 3 months   Any Other Special Instructions Will Be Listed Below (If Applicable).  If you need a refill on your cardiac medications before your next appointment, please call your pharmacy.

## 2019-06-05 MED ORDER — FLECAINIDE ACETATE 50 MG PO TABS: 50.0000 mg | ORAL_TABLET | Freq: Two times a day (BID) | ORAL | 3 refills | Status: DC

## 2019-06-05 NOTE — Addendum Note (Signed)
Addended by: Laurine Blazer on: 06/05/2019 09:16 AM   Modules accepted: Orders

## 2019-06-11 DIAGNOSIS — I10 Essential (primary) hypertension: Secondary | ICD-10-CM | POA: Diagnosis not present

## 2019-06-11 DIAGNOSIS — Z299 Encounter for prophylactic measures, unspecified: Secondary | ICD-10-CM | POA: Diagnosis not present

## 2019-06-11 DIAGNOSIS — E1165 Type 2 diabetes mellitus with hyperglycemia: Secondary | ICD-10-CM | POA: Diagnosis not present

## 2019-06-11 DIAGNOSIS — I4892 Unspecified atrial flutter: Secondary | ICD-10-CM | POA: Diagnosis not present

## 2019-06-11 DIAGNOSIS — E119 Type 2 diabetes mellitus without complications: Secondary | ICD-10-CM | POA: Diagnosis not present

## 2019-06-11 DIAGNOSIS — J449 Chronic obstructive pulmonary disease, unspecified: Secondary | ICD-10-CM | POA: Diagnosis not present

## 2019-06-11 DIAGNOSIS — Z6839 Body mass index (BMI) 39.0-39.9, adult: Secondary | ICD-10-CM | POA: Diagnosis not present

## 2019-06-11 DIAGNOSIS — I4891 Unspecified atrial fibrillation: Secondary | ICD-10-CM | POA: Diagnosis not present

## 2019-07-02 ENCOUNTER — Other Ambulatory Visit: Payer: Self-pay

## 2019-07-02 ENCOUNTER — Other Ambulatory Visit (HOSPITAL_COMMUNITY)
Admission: RE | Admit: 2019-07-02 | Discharge: 2019-07-02 | Disposition: A | Discharge status: Home / Self Care | Payer: Medicare HMO | Source: Ambulatory Visit | Attending: Internal Medicine | Admitting: Internal Medicine

## 2019-07-02 DIAGNOSIS — Z20828 Contact with and (suspected) exposure to other viral communicable diseases: Secondary | ICD-10-CM | POA: Diagnosis not present

## 2019-07-03 LAB — SARS CORONAVIRUS 2 (TAT 6-24 HRS): SARS Coronavirus 2: NEGATIVE

## 2019-07-05 ENCOUNTER — Other Ambulatory Visit: Payer: Self-pay

## 2019-07-05 ENCOUNTER — Ambulatory Visit (HOSPITAL_COMMUNITY)
Admission: RE | Admit: 2019-07-05 | Discharge: 2019-07-05 | Disposition: A | Discharge status: Home / Self Care | Payer: Medicare HMO | Source: Ambulatory Visit | Attending: Internal Medicine | Admitting: Internal Medicine

## 2019-07-05 DIAGNOSIS — R0602 Shortness of breath: Secondary | ICD-10-CM | POA: Insufficient documentation

## 2019-07-05 LAB — PULMONARY FUNCTION TEST
DL/VA % pred: 111 %
DL/VA: 4.53 ml/min/mmHg/L
DLCO unc % pred: 55 %
DLCO unc: 10.82 ml/min/mmHg
FEF 25-75 Post: 0.93 L/sec
FEF 25-75 Pre: 0.5 L/sec
FEF2575-%Change-Post: 84 %
FEF2575-%Pred-Post: 63 %
FEF2575-%Pred-Pre: 34 %
FEV1-%Change-Post: 11 %
FEV1-%Pred-Post: 52 %
FEV1-%Pred-Pre: 46 %
FEV1-Post: 0.89 L
FEV1-Pre: 0.8 L
FEV1FVC-%Change-Post: 18 %
FEV1FVC-%Pred-Pre: 98 %
FEV6-%Change-Post: -1 %
FEV6-%Pred-Post: 47 %
FEV6-%Pred-Pre: 48 %
FEV6-Post: 1.01 L
FEV6-Pre: 1.02 L
FEV6FVC-%Change-Post: 4 %
FEV6FVC-%Pred-Post: 104 %
FEV6FVC-%Pred-Pre: 99 %
FVC-%Change-Post: -5 %
FVC-%Pred-Post: 45 %
FVC-%Pred-Pre: 48 %
FVC-Post: 1.01 L
FVC-Pre: 1.07 L
Post FEV1/FVC ratio: 88 %
Post FEV6/FVC ratio: 100 %
Pre FEV1/FVC ratio: 75 %
Pre FEV6/FVC Ratio: 96 %
RV % pred: 49 %
RV: 1.18 L
TLC % pred: 50 %
TLC: 2.65 L

## 2019-07-05 MED ORDER — ALBUTEROL SULFATE (2.5 MG/3ML) 0.083% IN NEBU
2.5000 mg | INHALATION_SOLUTION | Freq: Once | RESPIRATORY_TRACT | Status: AC
  Administered 2019-07-05: 2.5 mg via RESPIRATORY_TRACT

## 2019-07-07 ENCOUNTER — Emergency Department (HOSPITAL_COMMUNITY)
Admission: EM | Admit: 2019-07-07 | Discharge: 2019-07-07 | Disposition: A | Discharge status: Home / Self Care | Payer: Medicare HMO | Attending: Emergency Medicine | Admitting: Emergency Medicine

## 2019-07-07 ENCOUNTER — Emergency Department (HOSPITAL_COMMUNITY): Payer: Medicare HMO

## 2019-07-07 ENCOUNTER — Other Ambulatory Visit: Payer: Self-pay

## 2019-07-07 ENCOUNTER — Encounter (HOSPITAL_COMMUNITY): Payer: Self-pay | Admitting: Emergency Medicine

## 2019-07-07 DIAGNOSIS — I700 Atherosclerosis of aorta: Secondary | ICD-10-CM | POA: Diagnosis not present

## 2019-07-07 DIAGNOSIS — N182 Chronic kidney disease, stage 2 (mild): Secondary | ICD-10-CM | POA: Insufficient documentation

## 2019-07-07 DIAGNOSIS — R109 Unspecified abdominal pain: Secondary | ICD-10-CM | POA: Insufficient documentation

## 2019-07-07 DIAGNOSIS — J449 Chronic obstructive pulmonary disease, unspecified: Secondary | ICD-10-CM | POA: Diagnosis not present

## 2019-07-07 DIAGNOSIS — I5032 Chronic diastolic (congestive) heart failure: Secondary | ICD-10-CM | POA: Diagnosis not present

## 2019-07-07 DIAGNOSIS — I2510 Atherosclerotic heart disease of native coronary artery without angina pectoris: Secondary | ICD-10-CM | POA: Diagnosis not present

## 2019-07-07 DIAGNOSIS — E1122 Type 2 diabetes mellitus with diabetic chronic kidney disease: Secondary | ICD-10-CM | POA: Insufficient documentation

## 2019-07-07 DIAGNOSIS — R079 Chest pain, unspecified: Secondary | ICD-10-CM

## 2019-07-07 DIAGNOSIS — Z7984 Long term (current) use of oral hypoglycemic drugs: Secondary | ICD-10-CM | POA: Diagnosis not present

## 2019-07-07 DIAGNOSIS — Z950 Presence of cardiac pacemaker: Secondary | ICD-10-CM | POA: Insufficient documentation

## 2019-07-07 DIAGNOSIS — Z79899 Other long term (current) drug therapy: Secondary | ICD-10-CM | POA: Insufficient documentation

## 2019-07-07 DIAGNOSIS — R0789 Other chest pain: Secondary | ICD-10-CM | POA: Insufficient documentation

## 2019-07-07 DIAGNOSIS — I130 Hypertensive heart and chronic kidney disease with heart failure and stage 1 through stage 4 chronic kidney disease, or unspecified chronic kidney disease: Secondary | ICD-10-CM | POA: Insufficient documentation

## 2019-07-07 LAB — COMPREHENSIVE METABOLIC PANEL
ALT: 15 U/L (ref 0–44)
AST: 18 U/L (ref 15–41)
Albumin: 3.2 g/dL — ABNORMAL LOW (ref 3.5–5.0)
Alkaline Phosphatase: 72 U/L (ref 38–126)
Anion gap: 9 (ref 5–15)
BUN: 18 mg/dL (ref 8–23)
CO2: 30 mmol/L (ref 22–32)
Calcium: 8.9 mg/dL (ref 8.9–10.3)
Chloride: 102 mmol/L (ref 98–111)
Creatinine, Ser: 1.05 mg/dL — ABNORMAL HIGH (ref 0.44–1.00)
GFR calc Af Amer: 59 mL/min — ABNORMAL LOW (ref >60)
GFR calc non Af Amer: 51 mL/min — ABNORMAL LOW (ref >60)
Glucose, Bld: 111 mg/dL — ABNORMAL HIGH (ref 70–99)
Potassium: 4.4 mmol/L (ref 3.5–5.1)
Sodium: 141 mmol/L (ref 135–145)
Total Bilirubin: 0.4 mg/dL (ref 0.3–1.2)
Total Protein: 6.9 g/dL (ref 6.5–8.1)

## 2019-07-07 LAB — CBC WITH DIFFERENTIAL/PLATELET
Abs Immature Granulocytes: 0.01 10*3/uL (ref 0.00–0.07)
Basophils Absolute: 0 10*3/uL (ref 0.0–0.1)
Basophils Relative: 1 %
Eosinophils Absolute: 0.4 10*3/uL (ref 0.0–0.5)
Eosinophils Relative: 8 %
HCT: 37.8 % (ref 36.0–46.0)
Hemoglobin: 11.3 g/dL — ABNORMAL LOW (ref 12.0–15.0)
Immature Granulocytes: 0 %
Lymphocytes Relative: 25 %
Lymphs Abs: 1.3 10*3/uL (ref 0.7–4.0)
MCH: 27.1 pg (ref 26.0–34.0)
MCHC: 29.9 g/dL — ABNORMAL LOW (ref 30.0–36.0)
MCV: 90.6 fL (ref 80.0–100.0)
Monocytes Absolute: 0.5 10*3/uL (ref 0.1–1.0)
Monocytes Relative: 10 %
Neutro Abs: 3 10*3/uL (ref 1.7–7.7)
Neutrophils Relative %: 56 %
Platelets: 264 10*3/uL (ref 150–400)
RBC: 4.17 MIL/uL (ref 3.87–5.11)
RDW: 17.3 % — ABNORMAL HIGH (ref 11.5–15.5)
WBC: 5.3 10*3/uL (ref 4.0–10.5)
nRBC: 0 % (ref 0.0–0.2)

## 2019-07-07 LAB — TROPONIN I (HIGH SENSITIVITY)
Troponin I (High Sensitivity): 7 ng/L (ref <18)
Troponin I (High Sensitivity): 7 ng/L (ref <18)

## 2019-07-07 MED ORDER — IOHEXOL 350 MG/ML SOLN
100.0000 mL | Freq: Once | INTRAVENOUS | Status: AC | PRN
  Administered 2019-07-07: 100 mL via INTRAVENOUS

## 2019-07-07 NOTE — ED Provider Notes (Signed)
  Physical Exam  BP 138/72 (BP Location: Right Arm)   Pulse (!) 59   Temp 98 F (36.7 C) (Oral)   Resp 15   Ht 5\' 5"  (1.651 m)   Wt 104.3 kg   SpO2 100%   BMI 38.27 kg/m   Physical Exam  ED Course/Procedures     Procedures  MDM   Assuming care from Dr. Alvino Chapel.  Patient has history of COPD, nonobstructive CAD, diabetes, A. fib.  Patient had come in with chest pain. Exam reveals reproducible chest pain on the left side.  EKG is reassuring.  Plan is to get delta troponin to, which if negative patient can be discharged.  At 630 patient second troponin had resulted and is negative.  Results of the ER work-up discussed with the patient and with his daughter, Mrs. March Rummage.  We have advised close outpatient follow-up with PCP or cardiology and strict ER return precautions were also discussed. They are both ok with the planned.    Varney Biles, MD 07/07/19 [phone]

## 2019-07-07 NOTE — ED Triage Notes (Signed)
Pt reports she had some itching and burning on her left side from her neck down around her breast on Thursday and then began to have pain on her left side. Denies other s/s.

## 2019-07-07 NOTE — ED Provider Notes (Signed)
[facility] Provider Note   CSN: [id number] Arrival date & time: 07/07/19  1203     History Chief Complaint  Patient presents with  . Chest Pain    Miranda Sexton is a 79 y.o. female.  HPI Patient presents with left chest shoulder and back pain.  Began a couple days ago.  Began on the front and then went to the shoulder and then down the back.  Has not had pains like this before.  Worse with moving around.  No fevers or chills.  Trauma.  Has been seen recently for shortness of breath and had a pulmonary function test last week and reportedly had negative Covid test last week also.  No fevers or chills.  No cough.  No swelling in her legs.  Has a pacemaker.  She is also on anticoagulation for atrial fibrillation    Past Medical History:  Diagnosis Date  . Anxiety   . Asthma   . Carotid artery occlusion   . Chronic bronchitis ([place])   . Chronic diastolic heart failure ([place])   . CKD (chronic kidney disease) stage 2, GFR 60-89 ml/min   . COPD (chronic obstructive pulmonary disease) ([place])   . Coronary atherosclerosis of native coronary artery    Nonobstructive 08/2008  . Daily headache   . DJD (degenerative joint disease)   . Essential hypertension   . GERD (gastroesophageal reflux disease)   . Hyperlipidemia   . Obesity   . On home oxygen therapy   . PAD (peripheral artery disease) (HCC)    Moderate right renal artery stenosis 08/2008  . Paroxysmal atrial fibrillation (HCC)   . Sick sinus syndrome (HCC)    Medtronic dual-chamber his bundle pacemaker - Dr. Rayann Heman  . Type 2 diabetes mellitus [facility])     Patient Active Problem List   Diagnosis Date Noted  . Heme + stool 03/28/2019  . Unstable angina ([place]) 06/17/2018  . S/P partial colectomy 10/19/2017  . Dysplastic colon polyp   . Absolute anemia 06/23/2017  . Cardiac device in situ   . Sick sinus syndrome ([place]) 03/02/2016  . Secondary pulmonary hypertension 12/20/2013  . Dysphagia 03/14/2013  .  Occlusion and stenosis of carotid artery without mention of cerebral infarction 12/05/2012  . Exertional dyspnea 11/09/2012  . Essential hypertension, benign 10/26/2012  . Chronic diastolic heart failure ([place]) 10/26/2012  . Mixed hyperlipidemia 09/10/2008  . CORONARY ATHEROSCLEROSIS NATIVE CORONARY ARTERY 09/10/2008    Past Surgical History:  Procedure Laterality Date  . BIOPSY  08/03/2017   Procedure: BIOPSY;  Surgeon: [name], MD;  [facility];  Service: Endoscopy;;  gastric   . CARDIAC CATHETERIZATION  2014  . CATARACT EXTRACTION W/ INTRAOCULAR LENS  IMPLANT, BILATERAL Bilateral 2008  . COLONOSCOPY N/A 08/03/2017   Procedure: COLONOSCOPY;  Surgeon: [name], MD;  [facility];  Service: Endoscopy;  Laterality: N/A;  . ENDARTERECTOMY Right 12/08/2012   Procedure: ENDARTERECTOMY CAROTID;  Surgeon: [name], MD;  Location: [facility];  Service: Vascular;  Laterality: Right;  . EP IMPLANTABLE DEVICE N/A 03/02/2016   Procedure: Pacemaker Implant;  Surgeon: [name], MD;  Location: [facility];  Service: Cardiovascular;  Laterality: N/A;  . ESOPHAGOGASTRODUODENOSCOPY N/A 08/03/2017   Procedure: ESOPHAGOGASTRODUODENOSCOPY (EGD);  Surgeon: [name], MD;  [facility];  Service: Endoscopy;  Laterality: N/A;  . ESOPHAGOGASTRODUODENOSCOPY (EGD) WITH PROPOFOL N/A 05/11/2019   Procedure: ESOPHAGOGASTRODUODENOSCOPY (EGD) WITH PROPOFOL;  Surgeon: Hildred Laser  U, MD;  [facility];  Service: Endoscopy;  Laterality: N/A;  145pm  . INSERT / REPLACE / REMOVE PACEMAKER  03/02/2016  . LEFT HEART CATH AND CORONARY ANGIOGRAPHY N/A 06/20/2018   Procedure: LEFT HEART CATH AND CORONARY ANGIOGRAPHY;  Surgeon: [name], MD;  Location: [facility];  Service: Cardiovascular;  Laterality: N/A;  . PARTIAL COLECTOMY N/A 10/19/2017   Procedure: PARTIAL COLECTOMY;  Surgeon: [name], MD;  Location[facility];  Service: General;   Laterality: N/A;  . POLYPECTOMY  08/03/2017   Procedure: POLYPECTOMY;  Surgeon: [name], MD;  [facility];  Service: Endoscopy;;  colon     OB History   No obstetric history on file.     Family History  Problem Relation Age of Onset  . Cancer Mother        Stomach  . Deep vein thrombosis Mother   . Diabetes Mother   . Hypertension Mother   . Heart disease Mother   . Hypertension Father   . Diabetes Sister   . Hyperlipidemia Sister   . Hypertension Sister   . Heart disease Sister   . Diabetes Brother   . Hyperlipidemia Brother   . Hypertension Brother     Social History   Tobacco Use  . Smoking status: Never Smoker  . Smokeless tobacco: Never Used  Substance Use Topics  . Alcohol use: No    Alcohol/week: 0.0 standard drinks  . Drug use: No    Home Medications Prior to Admission medications   Medication Sig Start Date End Date Taking? Authorizing Provider  acetaminophen (TYLENOL) 325 MG tablet Take 325 mg by mouth every 6 (six) hours as needed for mild pain or headache.    [provider]  albuterol (PROAIR HFA) 108 (90 Base) MCG/ACT inhaler Inhale 2 puffs into the lungs every 6 (six) hours as needed for wheezing or shortness of breath.     [provider]  atorvastatin (LIPITOR) 40 MG tablet Take 40 mg by mouth daily.    [provider]  benazepril (LOTENSIN) 40 MG tablet Take 40 mg by mouth daily.    [provider]  bismuth-metronidazole-tetracycline [facility]) [phone] MG capsule Take 3 capsules by mouth 4 (four) times daily -  before meals and at bedtime. 05/24/19   Rehman, Mechele Dawley, MD  dicyclomine (BENTYL) 10 MG capsule Take 10 mg by mouth 2 (two) times daily.     [provider]  ELIQUIS 5 MG TABS tablet TAKE 1 TABLET BY MOUTH TWICE DAILY Patient taking differently: Take 5 mg by mouth 2 (two) times daily.  01/08/19   Allred, Jeneen Rinks, MD  flecainide (TAMBOCOR) 50 MG tablet Take 1 tablet (50 mg total)  by mouth 2 (two) times daily. 06/05/19   Allred, Jeneen Rinks, MD  furosemide (LASIX) 40 MG tablet Take 1.5 tablets (60 mg total) by mouth daily. 05/18/19   Satira Sark, MD  glipiZIDE (GLUCOTROL XL) 10 MG 24 hr tablet Take 10 mg by mouth daily.     [provider]  insulin aspart protamine- aspart (NOVOLOG 70/30) (70-30) 100 UNIT/ML injection Inject 46-48 Units into the skin See admin instructions. Inject 46 units in the morning & 48 units in the evening.    [provider]  metoprolol succinate (TOPROL-XL) 25 MG 24 hr tablet TAKE 1/2 TABLET(12.5MG ) BY MOUTH DAILY Patient taking differently: Take 12.5 mg by mouth daily.  01/22/19   Satira Sark, MD  nitroGLYCERIN Delilah Shan)  0.4 MG SL tablet Place 1 tablet (0.4 mg total) under the tongue every 5 (five) minutes as needed for chest pain. 06/21/18   Hosie Poisson, MD  olopatadine (PATANOL) 0.1 % ophthalmic solution Place 1 drop into both eyes 2 (two) times daily.  02/25/16   [provider]  pantoprazole (PROTONIX) 40 MG tablet TAKE 1 TABLET BY MOUTH TWICE DAILY BEFORE MEALS 04/13/18   Setzer, Terri L, NP  potassium chloride SA (K-DUR) 20 MEQ tablet Take 1 tablet (20 mEq total) by mouth daily. 02/05/19   Satira Sark, MD  venlafaxine XR (EFFEXOR-XR) 150 MG 24 hr capsule Take 150 mg by mouth daily.    [provider]  vitamin B-12 (CYANOCOBALAMIN) 50 MCG tablet Take 50 mcg by mouth daily.    [provider]    Allergies    Codeine and Penicillins  Review of Systems   Review of Systems  Constitutional: Negative for appetite change.  HENT: Negative for congestion and dental problem.   Respiratory: Positive for shortness of breath.   Cardiovascular: Positive for chest pain.  Gastrointestinal: Negative for abdominal pain.  Genitourinary: Negative for flank pain.  Musculoskeletal: Positive for back pain.  Skin: Negative for rash.  Neurological: Negative for weakness.  Psychiatric/Behavioral:  Negative for confusion.    Physical Exam Updated Vital Signs BP 138/72 (BP Location: Right Arm)   Pulse (!) 59   Temp 98 F (36.7 C) (Oral)   Resp 15   Ht 5\' 5"  (1.651 m)   Wt 104.3 kg   SpO2 100%   BMI 38.27 kg/m   Physical Exam Vitals and nursing note reviewed.  HENT:     Head: Normocephalic.  Cardiovascular:     Rate and Rhythm: Normal rate and regular rhythm.  Pulmonary:     Breath sounds: No wheezing, rhonchi or rales.  Chest:     Chest wall: No tenderness.  Abdominal:     Tenderness: There is no abdominal tenderness.  Musculoskeletal:     Right lower leg: No edema.     Left lower leg: No edema.     Comments: Some tenderness over left shoulder.  No deformity.  Pulse intact bilateral wrists.  Skin:    General: Skin is warm.     Capillary Refill: Capillary refill takes less than 2 seconds.  Neurological:     Mental Status: She is alert.     ED Results / Procedures / Treatments   Labs (all labs ordered are listed, but only abnormal results are displayed) Labs Reviewed  CBC WITH DIFFERENTIAL/PLATELET - Abnormal; Notable for the following components:      Result Value   Hemoglobin 11.3 (*)    MCHC 29.9 (*)    RDW 17.3 (*)    All other components within normal limits  COMPREHENSIVE METABOLIC PANEL - Abnormal; Notable for the following components:   Glucose, Bld 111 (*)    Creatinine, Ser 1.05 (*)    Albumin 3.2 (*)    GFR calc non Af Amer 51 (*)    GFR calc Af Amer 59 (*)    All other components within normal limits  TROPONIN I (HIGH SENSITIVITY)  TROPONIN I (HIGH SENSITIVITY)    EKG EKG Interpretation  Date/Time:  Saturday July 07 2019 12:25:14 EST Ventricular Rate:  83 PR Interval:    QRS Duration: 139 QT Interval:  385 QTC Calculation: 453 R Axis:   17 Text Interpretation: Atrial-sensed ventricular-paced rhythm Confirmed by Davonna Belling [phone]) on 07/07/2019 12:27:42 PM  Radiology DG Chest Portable 1 View  Result Date:  07/07/2019 CLINICAL DATA:  Chest pain EXAM: PORTABLE CHEST 1 VIEW COMPARISON:  01/28/2019 FINDINGS: Cardiac shadow is enlarged but stable. Pacing device is noted. The lungs are well aerated with patchy opacities bilaterally. No sizable effusion is noted. IMPRESSION: Patchy opacities bilaterally likely related atelectatic change or early infiltrate. Electronically Signed   By: Inez Catalina M.D.   On: 07/07/2019 13:10    Procedures Procedures (including critical care time)  Medications Ordered in ED Medications - No data to display  ED Course  I have reviewed the triage vital signs and the nursing notes.  Pertinent labs & imaging results that were available during my care of the patient were reviewed by me and considered in my medical decision making (see chart for details).    MDM Rules/Calculators/A&P                      Patient with left-sided chest pain going to arm.  Previous cardiac ischemic work-up reassuring, but does have history of atrial fibrillation.  Has a pacemaker.  She is on Eliquis.  EKG paced here.  With pain that started in the chest then went up to the shoulder and then down the back that is somewhat worrisome for a dissection.  Will get dissection protocol CT.  If negative likely be able to discharge home.  X-ray showed possible pneumonia.  Has not been coughing however.  Will also be better visualized on CT scan. Care turned over to Dr. Kathrynn Humble Final Clinical Impression(s) / ED Diagnoses Final diagnoses:  Nonspecific chest pain    Rx / DC Orders ED Discharge Orders    None       Davonna Belling, MD 07/07/19 1609

## 2019-07-07 NOTE — Discharge Instructions (Addendum)

## 2019-07-09 ENCOUNTER — Telehealth: Payer: Self-pay

## 2019-07-09 ENCOUNTER — Other Ambulatory Visit: Payer: Self-pay | Admitting: *Deleted

## 2019-07-09 DIAGNOSIS — R942 Abnormal results of pulmonary function studies: Secondary | ICD-10-CM

## 2019-07-09 DIAGNOSIS — R0602 Shortness of breath: Secondary | ICD-10-CM

## 2019-07-09 MED ORDER — APIXABAN 5 MG PO TABS: 5.0000 mg | ORAL_TABLET | Freq: Two times a day (BID) | ORAL | 5 refills | Status: DC

## 2019-07-09 NOTE — Telephone Encounter (Signed)
Call placed to Pt.  Advised per Dr. Delbert Harness reveals severe obstructive disease. Please refer to pulmonary (I dont see that she has seen them previously). She lives in [place]. Could see either Dr Luan Pulling or come to [place] to pulmonary.  Per Pt she cannot drive to [place].  Referral placed to Dr. Luan Pulling in [place].  Advised Pt that she would receive a call from that office with appt.

## 2019-07-13 ENCOUNTER — Telehealth: Payer: Self-pay

## 2019-07-13 NOTE — Telephone Encounter (Signed)
Spoke with patient to remind of missed remote transmission 

## 2019-07-19 ENCOUNTER — Other Ambulatory Visit: Payer: Self-pay | Admitting: Cardiology

## 2019-07-19 ENCOUNTER — Telehealth (INDEPENDENT_AMBULATORY_CARE_PROVIDER_SITE_OTHER): Payer: Medicare HMO | Admitting: Cardiology

## 2019-07-19 ENCOUNTER — Encounter: Payer: Self-pay | Admitting: Cardiology

## 2019-07-19 VITALS — Ht 65.0 in | Wt 228.0 lb

## 2019-07-19 DIAGNOSIS — I25119 Atherosclerotic heart disease of native coronary artery with unspecified angina pectoris: Secondary | ICD-10-CM

## 2019-07-19 DIAGNOSIS — I10 Essential (primary) hypertension: Secondary | ICD-10-CM

## 2019-07-19 DIAGNOSIS — I495 Sick sinus syndrome: Secondary | ICD-10-CM

## 2019-07-19 DIAGNOSIS — I480 Paroxysmal atrial fibrillation: Secondary | ICD-10-CM

## 2019-07-19 DIAGNOSIS — I5032 Chronic diastolic (congestive) heart failure: Secondary | ICD-10-CM

## 2019-07-19 NOTE — Patient Instructions (Addendum)
Medication Instructions:   Your physician recommends that you continue on your current medications as directed. Please refer to the Current Medication list given to you today.  Labwork:  Your physician recommends that you return for lab work in: 6 months just before your next visit to check your BMET.  Testing/Procedures:  NONE  Follow-Up:  Your physician recommends that you schedule a follow-up appointment in: 6 months. You will receive a reminder letter in the mail in about 4 months reminding you to call and schedule your appointment. If you don't receive this letter, please contact our office.  Any Other Special Instructions Will Be Listed Below (If Applicable).  If you need a refill on your cardiac medications before your next appointment, please call your pharmacy.

## 2019-07-19 NOTE — Progress Notes (Signed)
Virtual Visit via Telephone Note   This visit type was conducted due to national recommendations for restrictions regarding the COVID-19 Pandemic (e.g. social distancing) in an effort to limit this patient's exposure and mitigate transmission in our community.  Due to her co-morbid illnesses, this patient is at least at moderate risk for complications without adequate follow up.  This format is felt to be most appropriate for this patient at this time.  The patient did not have access to video technology/had technical difficulties with video requiring transitioning to audio format only (telephone).  All issues noted in this document were discussed and addressed.  No physical exam could be performed with this format.  Please refer to the patient's chart for her  consent to telehealth for [facility].   Date:  07/19/2019   ID:  TAHLI MATHSON, DOB 1940/10/14, MRN [id number]  Patient Location: Home Provider Location: Office  PCP:  Glenda Chroman, MD  Cardiologist:  Rozann Lesches, MD Electrophysiologist:  [name], MD   Evaluation Performed:  Follow-Up Visit  Chief Complaint:   Cardiac follow-up  History of Present Illness:    Miranda Sexton is a 79 y.o. female last seen in October 2020.  We spoke by phone today.  She states that she has been doing reasonably well, she and her husband have been staying around their house mostly during the pandemic.  She remains functional with ADLs, states that MDIs have been helpful, also feels like she is breathing more easily since we increased her Lasix.  Leg swelling has been less prominent.  She sees Dr. Rayann Heman in the device clinic, Medtronic His bundle pacemaker in place.  PFTs ordered by Dr. Rayann Heman in December 2020 showed severe ventilatory defect without bronchodilator improvement, decreased lung volumes and moderately reduced DLCO.  She has been referred for Pulmonary evaluation.  Echocardiogram in November 2020 revealed LVEF 65 to 70% with  increased LVEDP, moderately elevated RVSP, mild aortic stenosis, moderate mitral regurgitation.  Lasix dose was increased after that study.  I went over her medications.  Current cardiac regimen includes Eliquis, Lotensin, flecainide, Lipitor, Lasix, potassium supplements, and Toprol-XL.  The patient does not have symptoms concerning for COVID-19 infection (fever, chills, cough, or new shortness of breath).    Past Medical History:  Diagnosis Date  . Anxiety   . Asthma   . Carotid artery occlusion   . Chronic bronchitis ([place])   . Chronic diastolic heart failure ([place])   . CKD (chronic kidney disease) stage 2, GFR 60-89 ml/min   . COPD (chronic obstructive pulmonary disease) ([place])   . Coronary atherosclerosis of native coronary artery    Nonobstructive 08/2008  . Daily headache   . DJD (degenerative joint disease)   . Essential hypertension   . GERD (gastroesophageal reflux disease)   . Hyperlipidemia   . Obesity   . On home oxygen therapy   . PAD (peripheral artery disease) (HCC)    Moderate right renal artery stenosis 08/2008  . Paroxysmal atrial fibrillation (HCC)   . Sick sinus syndrome (HCC)    Medtronic dual-chamber his bundle pacemaker - Dr. Rayann Heman  . Type 2 diabetes mellitus ([place])    Past Surgical History:  Procedure Laterality Date  . BIOPSY  08/03/2017   Procedure: BIOPSY;  Surgeon: [name], MD;  [facility];  Service: Endoscopy;;  gastric   . CARDIAC CATHETERIZATION  2014  . CATARACT EXTRACTION W/ INTRAOCULAR LENS  IMPLANT, BILATERAL Bilateral 2008  .  COLONOSCOPY N/A 08/03/2017   Procedure: COLONOSCOPY;  Surgeon: [name], MD;  [facility];  Service: Endoscopy;  Laterality: N/A;  . ENDARTERECTOMY Right 12/08/2012   Procedure: ENDARTERECTOMY CAROTID;  Surgeon: [name], MD;  Location: [facility];  Service: Vascular;  Laterality: Right;  . EP IMPLANTABLE DEVICE N/A 03/02/2016   Procedure: Pacemaker Implant;  Surgeon: [name],  MD;  Location: [facility] [facility];  Service: Cardiovascular;  Laterality: N/A;  . ESOPHAGOGASTRODUODENOSCOPY N/A 08/03/2017   Procedure: ESOPHAGOGASTRODUODENOSCOPY (EGD);  Surgeon: [name], MD;  [facility];  Service: Endoscopy;  Laterality: N/A;  . ESOPHAGOGASTRODUODENOSCOPY (EGD) WITH PROPOFOL N/A 05/11/2019   Procedure: ESOPHAGOGASTRODUODENOSCOPY (EGD) WITH PROPOFOL;  Surgeon: [name], MD;  [facility];  Service: Endoscopy;  Laterality: N/A;  145pm  . INSERT / REPLACE / REMOVE PACEMAKER  03/02/2016  . LEFT HEART CATH AND CORONARY ANGIOGRAPHY N/A 06/20/2018   Procedure: LEFT HEART CATH AND CORONARY ANGIOGRAPHY;  Surgeon: [name], MD;  Location: [facility];  Service: Cardiovascular;  Laterality: N/A;  . PARTIAL COLECTOMY N/A 10/19/2017   Procedure: PARTIAL COLECTOMY;  Surgeon: [name], MD;  [facility];  Service: General;  Laterality: N/A;  . POLYPECTOMY  08/03/2017   Procedure: POLYPECTOMY;  Surgeon: [name], MD;  [facility];  Service: Endoscopy;;  colon     Current Meds  Medication Sig  . acetaminophen (TYLENOL) 325 MG tablet Take 325 mg by mouth every 6 (six) hours as needed for mild pain or headache.  . albuterol (PROAIR HFA) 108 (90 Base) MCG/ACT inhaler Inhale 2 puffs into the lungs every 6 (six) hours as needed for wheezing or shortness of breath.   Marland Kitchen apixaban (ELIQUIS) 5 MG TABS tablet Take 1 tablet (5 mg total) by mouth 2 (two) times daily.  Marland Kitchen atorvastatin (LIPITOR) 40 MG tablet Take 40 mg by mouth daily.  . benazepril (LOTENSIN) 40 MG tablet Take 40 mg by mouth daily.  . flecainide (TAMBOCOR) 50 MG tablet Take 1 tablet (50 mg total) by mouth 2 (two) times daily.  . furosemide (LASIX) 40 MG tablet Take 1.5 tablets (60 mg total) by mouth daily.  Marland Kitchen glipiZIDE (GLUCOTROL XL) 10 MG 24 hr tablet Take 10 mg by mouth daily.   . insulin aspart protamine- aspart (NOVOLOG 70/30) (70-30) 100 UNIT/ML  injection Inject 46-48 Units into the skin See admin instructions. Inject 46 units in the morning & 48 units in the evening.  . metoprolol succinate (TOPROL-XL) 25 MG 24 hr tablet TAKE 1/2 TABLET(12.5 MG) BY MOUTH DAILY  . nitroGLYCERIN (NITROSTAT) 0.4 MG SL tablet Place 1 tablet (0.4 mg total) under the tongue every 5 (five) minutes as needed for chest pain.  . potassium chloride SA (K-DUR) 20 MEQ tablet Take 1 tablet (20 mEq total) by mouth daily.  Marland Kitchen venlafaxine XR (EFFEXOR-XR) 150 MG 24 hr capsule Take 150 mg by mouth daily.  . vitamin B-12 (CYANOCOBALAMIN) 50 MCG tablet Take 50 mcg by mouth daily.     Allergies:   Codeine and Penicillins   Social History   Tobacco Use  . Smoking status: Never Smoker  . Smokeless tobacco: Never Used  Substance Use Topics  . Alcohol use: No    Alcohol/week: 0.0 standard drinks  . Drug use: No     Family Hx: The patient's family history includes Cancer in her mother; Deep vein thrombosis in her mother; Diabetes in her brother, mother,  and sister; Heart disease in her mother and sister; Hyperlipidemia in her brother and sister; Hypertension in her brother, father, mother, and sister.  ROS:   Please see the history of present illness. All other systems reviewed and are negative.   Prior CV studies:   The following studies were reviewed today:  Echocardiogram 05/16/2019:  1. Left ventricular ejection fraction, by visual estimation, is 65 to 70%. The left ventricle has hyperdynamic function. There is no left ventricular hypertrophy.  2. Elevated left ventricular end-diastolic pressure.  3. Left ventricular diastolic parameters are indeterminate.  4. The left ventricle has no regional wall motion abnormalities.  5. Global right ventricle has normal systolic function.The right ventricular size is normal. No increase in right ventricular wall thickness.  6. Left atrial size was normal.  7. Right atrial size was normal.  8. Mild mitral annular  calcification.  9. The mitral valve is degenerative. Moderate mitral valve regurgitation. 10. The tricuspid valve is grossly normal. Tricuspid valve regurgitation moderate. 11. The aortic valve is tricuspid. Aortic valve regurgitation is not visualized. Mild aortic valve stenosis. 12. There is Moderate calcification of the aortic valve. 13. There is Moderate thickening of the aortic valve. 14. The pulmonic valve was grossly normal. Pulmonic valve regurgitation is trivial. 15. Moderately elevated pulmonary artery systolic pressure. 16. A pacer wire is visualized. 17. The inferior vena cava is normal in size with greater than 50% respiratory variability, suggesting right atrial pressure of 3 mmHg.  Labs/Other Tests and Data Reviewed:    EKG:  An ECG dated 07/07/2019 was personally reviewed today and demonstrated:  Ventricular pacing with atrial sensing.  Recent Labs: 07/07/2019: ALT 15; BUN 18; Creatinine, Ser 1.05; Hemoglobin 11.3; Platelets 264; Potassium 4.4; Sodium 141   Recent Lipid Panel Lab Results  Component Value Date/Time   CHOL 97 06/18/2018 07:24 AM   TRIG 62 06/18/2018 07:24 AM   HDL 36 (L) 06/18/2018 07:24 AM   CHOLHDL 2.7 06/18/2018 07:24 AM   LDLCALC 49 06/18/2018 07:24 AM    Wt Readings from Last 3 Encounters:  07/19/19 228 lb (103.4 kg)  07/07/19 230 lb (104.3 kg)  05/30/19 228 lb (103.4 kg)     Objective:    Vital Signs:  Ht 5\' 5"  (1.651 m)   Wt 228 lb (103.4 kg)   BMI 37.94 kg/m    She did not have a way to check vital signs today. Patient spoke in full sentences, not short of breath. No audible wheezing or coughing. Speech pattern normal.  ASSESSMENT & PLAN:    1.  Paroxysmal atrial fibrillation.  She does not report any active palpitations and continues on flecainide, Toprol-XL, and Eliquis.  I reviewed her recent lab work as outlined above.  She does not report any bleeding problems.  2.  Sick sinus syndrome with Medtronic His bundle pacemaker in  place.  She continues to follow with Dr. Rayann Heman.  3.  Chronic diastolic heart failure.  Echocardiogram from November 2020 showed LVEF 65 to 70% with increased LVEDP.  She does feel somewhat better after increasing Lasix, less leg swelling.  Continue with current medical regimen.  4.  Abnormal PFTs with severe ventilatory defect without bronchodilator improvement, decreased lung volumes, and moderately reduced DLCO.  She has been referred for pulmonary consultation.  5.  History of nonobstructive CAD, no obvious angina symptoms.  She is not on aspirin given use of Eliquis.  Continue beta-blocker, ACE inhibitor, and statin.  COVID-19 Education: The signs and symptoms of  COVID-19 were discussed with the patient and how to seek care for testing (follow up with PCP or arrange E-visit).  The importance of social distancing was discussed today.  Time:   Today, I have spent 8 minutes with the patient with telehealth technology discussing the above problems.     Medication Adjustments/Labs and Tests Ordered: Current medicines are reviewed at length with the patient today.  Concerns regarding medicines are outlined above.   Tests Ordered: No orders of the defined types were placed in this encounter.   Medication Changes: No orders of the defined types were placed in this encounter.   Follow Up:  In Person 6 months in the [place] office.  Signed, Rozann Lesches, MD  07/19/2019 1:15 PM    [facility]

## 2019-08-03 ENCOUNTER — Telehealth: Payer: Self-pay

## 2019-08-03 NOTE — Telephone Encounter (Signed)
Miranda Sexton sent to Damian Leavell, RN  FYI    Referral to Dr Luan Pulling in [place]. He has retired and Music therapist will (one day) take over that practice. However, they could not give me a date. Called patient to see if she could come to [place] to be seen and she said she did not want referral. Referral has been cancelled.

## 2019-08-30 ENCOUNTER — Telehealth: Payer: Self-pay | Admitting: *Deleted

## 2019-08-30 DIAGNOSIS — I25119 Atherosclerotic heart disease of native coronary artery with unspecified angina pectoris: Secondary | ICD-10-CM | POA: Diagnosis not present

## 2019-08-30 DIAGNOSIS — I509 Heart failure, unspecified: Secondary | ICD-10-CM | POA: Diagnosis not present

## 2019-08-30 DIAGNOSIS — Z008 Encounter for other general examination: Secondary | ICD-10-CM | POA: Diagnosis not present

## 2019-08-30 DIAGNOSIS — I4891 Unspecified atrial fibrillation: Secondary | ICD-10-CM | POA: Diagnosis not present

## 2019-08-30 DIAGNOSIS — R69 Illness, unspecified: Secondary | ICD-10-CM | POA: Diagnosis not present

## 2019-08-30 DIAGNOSIS — D6869 Other thrombophilia: Secondary | ICD-10-CM | POA: Diagnosis not present

## 2019-08-30 DIAGNOSIS — Z6841 Body Mass Index (BMI) 40.0 and over, adult: Secondary | ICD-10-CM | POA: Diagnosis not present

## 2019-08-30 DIAGNOSIS — I110 Hypertensive heart disease with heart failure: Secondary | ICD-10-CM | POA: Diagnosis not present

## 2019-08-30 DIAGNOSIS — J449 Chronic obstructive pulmonary disease, unspecified: Secondary | ICD-10-CM | POA: Diagnosis not present

## 2019-08-30 DIAGNOSIS — Z794 Long term (current) use of insulin: Secondary | ICD-10-CM | POA: Diagnosis not present

## 2019-08-30 NOTE — Telephone Encounter (Signed)
I called the pt to help her send a manual transmission but she was not at home. I tried to leave my direct office number but the person hung up on me.

## 2019-08-30 NOTE — Telephone Encounter (Signed)
Spoke with patient. Advised we have not received a PPM transmission in some time. Pt agrees to transmit today, reports she does not need assistance. No additional questions at this time.

## 2019-08-30 NOTE — Telephone Encounter (Signed)
Patient does require assistance to send the transmission. She is stepping out for a bit, please call her in about an hour as she is heading out to the drug store.

## 2019-08-31 NOTE — Telephone Encounter (Signed)
I tried to help the pt to send a manual transmission but it gave her the error code [id number] twice. I gave her the number to [place] support to get additional help.

## 2019-09-03 NOTE — Telephone Encounter (Signed)
I called pt but got the busy signal. Will try again tomorrow.

## 2019-09-03 NOTE — Telephone Encounter (Signed)
New Message:    Please call,having problems with her device

## 2019-09-04 NOTE — Telephone Encounter (Signed)
I called [employer] with the pt and they are sending her a new handheld. She should received the new handheld in 5-7 business days.

## 2019-09-05 NOTE — Telephone Encounter (Signed)
Spoke with [employer] Stay Therapist, nutritional. New reader has been shipped and should arrived within the next couple of days.

## 2019-09-07 ENCOUNTER — Ambulatory Visit (INDEPENDENT_AMBULATORY_CARE_PROVIDER_SITE_OTHER): Payer: Medicare HMO | Admitting: Internal Medicine

## 2019-09-07 ENCOUNTER — Other Ambulatory Visit: Payer: Self-pay | Admitting: *Deleted

## 2019-09-07 ENCOUNTER — Encounter: Payer: Self-pay | Admitting: Internal Medicine

## 2019-09-07 ENCOUNTER — Other Ambulatory Visit: Payer: Self-pay

## 2019-09-07 VITALS — BP 130/62 | HR 85 | Ht 65.0 in | Wt 233.0 lb

## 2019-09-07 DIAGNOSIS — D6869 Other thrombophilia: Secondary | ICD-10-CM

## 2019-09-07 DIAGNOSIS — I495 Sick sinus syndrome: Secondary | ICD-10-CM | POA: Diagnosis not present

## 2019-09-07 DIAGNOSIS — I10 Essential (primary) hypertension: Secondary | ICD-10-CM

## 2019-09-07 DIAGNOSIS — I442 Atrioventricular block, complete: Secondary | ICD-10-CM | POA: Diagnosis not present

## 2019-09-07 DIAGNOSIS — I4819 Other persistent atrial fibrillation: Secondary | ICD-10-CM

## 2019-09-07 DIAGNOSIS — Z959 Presence of cardiac and vascular implant and graft, unspecified: Secondary | ICD-10-CM | POA: Diagnosis not present

## 2019-09-07 LAB — CUP PACEART INCLINIC DEVICE CHECK
Battery Remaining Longevity: 10 mo
Battery Voltage: 2.91 V
Brady Statistic AP VP Percent: 98.81 %
Brady Statistic AP VS Percent: 0.01 %
Brady Statistic AS VP Percent: 1.17 %
Brady Statistic AS VS Percent: 0.01 %
Brady Statistic RA Percent Paced: 98.64 %
Brady Statistic RV Percent Paced: 99.67 %
Date Time Interrogation Session: 20210305120912
Implantable Lead Implant Date: 20170829
Implantable Lead Implant Date: 20170829
Implantable Lead Location: 753859
Implantable Lead Location: 753860
Implantable Lead Model: 3830
Implantable Lead Model: 5076
Implantable Pulse Generator Implant Date: 20170829
Lead Channel Impedance Value: 266 Ohm
Lead Channel Impedance Value: 304 Ohm
Lead Channel Impedance Value: 418 Ohm
Lead Channel Impedance Value: 437 Ohm
Lead Channel Pacing Threshold Amplitude: 0.75 V
Lead Channel Pacing Threshold Amplitude: 2 V
Lead Channel Pacing Threshold Pulse Width: 0.4 ms
Lead Channel Pacing Threshold Pulse Width: 1 ms
Lead Channel Sensing Intrinsic Amplitude: 1.9 mV
Lead Channel Sensing Intrinsic Amplitude: 4.125 mV
Lead Channel Setting Pacing Amplitude: 2 V
Lead Channel Setting Pacing Amplitude: 5 V
Lead Channel Setting Pacing Pulse Width: 1 ms
Lead Channel Setting Sensing Sensitivity: 0.9 mV

## 2019-09-07 NOTE — Patient Instructions (Signed)
Medication Instructions:  Continue all current medications.   Labwork: none  Testing/Procedures: none  Follow-Up: 6 months   Any Other Special Instructions Will Be Listed Below (If Applicable).   If you need a refill on your cardiac medications before your next appointment, please call your pharmacy.  

## 2019-09-07 NOTE — Progress Notes (Signed)
PCP: Glenda Chroman, MD Primary Cardiologist: Dr Domenic Polite Primary EP:  Dr Rayann Heman  Miranda Sexton is a 79 y.o. female who presents today for routine electrophysiology followup.  Since last being seen in our clinic, the patient reports doing very well.  Today, she denies symptoms of palpitations, chest pain, shortness of breath,  lower extremity edema, dizziness, presyncope, or syncope.  The patient is otherwise without complaint today.   Past Medical History:  Diagnosis Date  . Anxiety   . Asthma   . Carotid artery occlusion   . Chronic bronchitis ([place])   . Chronic diastolic heart failure ([place])   . CKD (chronic kidney disease) stage 2, GFR 60-89 ml/min   . COPD (chronic obstructive pulmonary disease) ([place])   . Coronary atherosclerosis of native coronary artery    Nonobstructive 08/2008  . Daily headache   . DJD (degenerative joint disease)   . Essential hypertension   . GERD (gastroesophageal reflux disease)   . Hyperlipidemia   . Obesity   . On home oxygen therapy   . PAD (peripheral artery disease) (HCC)    Moderate right renal artery stenosis 08/2008  . Paroxysmal atrial fibrillation (HCC)   . Sick sinus syndrome (HCC)    [employer] dual-chamber his bundle pacemaker - Dr. Rayann Heman  . Type 2 diabetes mellitus ([place])    Past Surgical History:  Procedure Laterality Date  . BIOPSY  08/03/2017   Procedure: BIOPSY;  Surgeon: [name], MD;  [facility];  Service: Endoscopy;;  gastric   . CARDIAC CATHETERIZATION  2014  . CATARACT EXTRACTION W/ INTRAOCULAR LENS  IMPLANT, BILATERAL Bilateral 2008  . COLONOSCOPY N/A 08/03/2017   Procedure: COLONOSCOPY;  Surgeon: [name], MD;  [facility];  Service: Endoscopy;  Laterality: N/A;  . ENDARTERECTOMY Right 12/08/2012   Procedure: ENDARTERECTOMY CAROTID;  Surgeon: [name], MD;  Location: [facility];  Service: Vascular;  Laterality: Right;  . EP IMPLANTABLE DEVICE N/A 03/02/2016   Procedure: Pacemaker  Implant;  Surgeon: [name], MD;  Location: [facility];  Service: Cardiovascular;  Laterality: N/A;  . ESOPHAGOGASTRODUODENOSCOPY N/A 08/03/2017   Procedure: ESOPHAGOGASTRODUODENOSCOPY (EGD);  Surgeon: [name], MD;  [facility];  Service: Endoscopy;  Laterality: N/A;  . ESOPHAGOGASTRODUODENOSCOPY (EGD) WITH PROPOFOL N/A 05/11/2019   Procedure: ESOPHAGOGASTRODUODENOSCOPY (EGD) WITH PROPOFOL;  Surgeon: [name], MD;  [facility];  Service: Endoscopy;  Laterality: N/A;  145pm  . INSERT / REPLACE / REMOVE PACEMAKER  03/02/2016  . LEFT HEART CATH AND CORONARY ANGIOGRAPHY N/A 06/20/2018   Procedure: LEFT HEART CATH AND CORONARY ANGIOGRAPHY;  Surgeon: [name], MD;  Location: [facility];  Service: Cardiovascular;  Laterality: N/A;  . PARTIAL COLECTOMY N/A 10/19/2017   Procedure: PARTIAL COLECTOMY;  Surgeon: [name], MD;  [facility];  Service: General;  Laterality: N/A;  . POLYPECTOMY  08/03/2017   Procedure: POLYPECTOMY;  Surgeon: [name], MD;  [facility];  Service: Endoscopy;;  colon    ROS- all systems are reviewed and negative except as per HPI above  Current Outpatient Medications  Medication Sig Dispense Refill  . acetaminophen (TYLENOL) 325 MG tablet Take 325 mg by mouth every 6 (six) hours as needed for mild pain or headache.    . albuterol (PROAIR HFA) 108 (90 Base) MCG/ACT inhaler Inhale 2 puffs into the lungs every 6 (six) hours as needed for wheezing or shortness  of breath.     Marland Kitchen apixaban (ELIQUIS) 5 MG TABS tablet Take 1 tablet (5 mg total) by mouth 2 (two) times daily. 60 tablet 5  . atorvastatin (LIPITOR) 40 MG tablet Take 40 mg by mouth daily.    . benazepril (LOTENSIN) 40 MG tablet Take 40 mg by mouth daily.    . flecainide (TAMBOCOR) 50 MG tablet Take 1 tablet (50 mg total) by mouth 2 (two) times daily. 180 tablet 3  . furosemide (LASIX) 40 MG tablet Take 80 mg by mouth daily.    Marland Kitchen  glipiZIDE (GLUCOTROL XL) 10 MG 24 hr tablet Take 10 mg by mouth daily.     . insulin aspart protamine- aspart (NOVOLOG 70/30) (70-30) 100 UNIT/ML injection Inject 46-48 Units into the skin See admin instructions. Inject 46 units in the morning & 48 units in the evening.    . metoprolol succinate (TOPROL-XL) 25 MG 24 hr tablet TAKE 1/2 TABLET(12.5 MG) BY MOUTH DAILY 45 tablet 1  . nitroGLYCERIN (NITROSTAT) 0.4 MG SL tablet Place 1 tablet (0.4 mg total) under the tongue every 5 (five) minutes as needed for chest pain. 30 tablet 2  . potassium chloride SA (K-DUR) 20 MEQ tablet Take 1 tablet (20 mEq total) by mouth daily. 90 tablet 0  . venlafaxine XR (EFFEXOR-XR) 150 MG 24 hr capsule Take 150 mg by mouth daily.    . vitamin B-12 (CYANOCOBALAMIN) 50 MCG tablet Take 50 mcg by mouth daily.     No current facility-administered medications for this visit.    Physical Exam: Vitals:   09/07/19 0907  BP: 130/62  Pulse: 85  SpO2: 93%  Weight: 233 lb (105.7 kg)  Height: 5\' 5"  (1.651 m)    GEN- The patient is well appearing, alert and oriented x 3 today.   Head- normocephalic, atraumatic Eyes-  Sclera clear, conjunctiva pink Ears- hearing intact Oropharynx- clear Lungs-   normal work of breathing Chest- pacemaker pocket is well healed Heart- Regular rate and rhythm (paced) GI- soft Extremities- no clubbing, cyanosis, or edema  Pacemaker interrogation- reviewed in detail today,  See PACEART report  ekg tracing ordered today is personally reviewed and shows AV paced rhythm  Assessment and Plan:  1. Symptomatic  complete heart block Normal pacemaker function but with high RV lead threshold.  We have seen this with His pacing cases previously.  We discussed at length today.  I have advised lead revision.  She has 7-12 months remaining on her battery.  She is very clear that she wishes to wait until then to proceed with lead revision.  I would plan left bundle pacing then.  She understands risks  of waiting.  I have been very clear that she should call my office immediately should she have dizziness or presyncope. Threshold today is 2V @ 1 msec.  This has been stable recently.  She is programmed 5V @ 1 msec.  She has an escape today at 31 bpm See Claudia Desanctis Art report No changes today she is not device dependant today  2. Paroxysmal atrial fibrillation afib burden is 0.6% (previously 0.7%) with low dose flecainide.  The importance of regular follow-up to avoid drug toxicity is considered today. chads2vasc score is 6.    3. HTN Stable No change required today  4. SOB/ fatigue Much improved  Return to see me in 6 months in the office The importance of compliance with remotes was stressed again today  [name] MD, [facility] 09/07/2019 10:04 AM

## 2019-09-13 NOTE — Telephone Encounter (Signed)
Spoke with pt, attempted to help her transmit.  Unsuccessful, attempted to conference in [place], encountered long wait.  Message on screen is showing cell tower and 4 filled blue bars.  I suspect that this is updating her cellular connection and transmission will come through when complete.  If not pt will try transmission again tomorrow.

## 2019-09-13 NOTE — Telephone Encounter (Signed)
The pt has received her new handheld. [employer] told her to let it charge for an hour then try again.

## 2019-09-14 NOTE — Telephone Encounter (Signed)
Assisted patient with sending transmission. Patient states screen has green check mark with todays date. Will continue to check [facility] today.

## 2019-09-14 NOTE — Telephone Encounter (Signed)
Transmission received. Set up for monthly battery checks.

## 2019-09-17 DIAGNOSIS — I4891 Unspecified atrial fibrillation: Secondary | ICD-10-CM | POA: Diagnosis not present

## 2019-09-17 DIAGNOSIS — E1165 Type 2 diabetes mellitus with hyperglycemia: Secondary | ICD-10-CM | POA: Diagnosis not present

## 2019-09-17 DIAGNOSIS — I10 Essential (primary) hypertension: Secondary | ICD-10-CM | POA: Diagnosis not present

## 2019-09-17 DIAGNOSIS — Z299 Encounter for prophylactic measures, unspecified: Secondary | ICD-10-CM | POA: Diagnosis not present

## 2019-09-17 DIAGNOSIS — R69 Illness, unspecified: Secondary | ICD-10-CM | POA: Diagnosis not present

## 2019-09-17 DIAGNOSIS — J449 Chronic obstructive pulmonary disease, unspecified: Secondary | ICD-10-CM | POA: Diagnosis not present

## 2019-09-21 DIAGNOSIS — I509 Heart failure, unspecified: Secondary | ICD-10-CM | POA: Diagnosis not present

## 2019-09-21 DIAGNOSIS — E119 Type 2 diabetes mellitus without complications: Secondary | ICD-10-CM | POA: Diagnosis not present

## 2019-09-21 DIAGNOSIS — J449 Chronic obstructive pulmonary disease, unspecified: Secondary | ICD-10-CM | POA: Diagnosis not present

## 2019-10-02 DIAGNOSIS — E1165 Type 2 diabetes mellitus with hyperglycemia: Secondary | ICD-10-CM | POA: Diagnosis not present

## 2019-10-24 DIAGNOSIS — I10 Essential (primary) hypertension: Secondary | ICD-10-CM | POA: Diagnosis not present

## 2019-10-24 DIAGNOSIS — I4819 Other persistent atrial fibrillation: Secondary | ICD-10-CM | POA: Diagnosis not present

## 2019-10-26 ENCOUNTER — Telehealth: Payer: Self-pay | Admitting: *Deleted

## 2019-10-26 DIAGNOSIS — E119 Type 2 diabetes mellitus without complications: Secondary | ICD-10-CM | POA: Diagnosis not present

## 2019-10-26 DIAGNOSIS — I509 Heart failure, unspecified: Secondary | ICD-10-CM | POA: Diagnosis not present

## 2019-10-26 DIAGNOSIS — J449 Chronic obstructive pulmonary disease, unspecified: Secondary | ICD-10-CM | POA: Diagnosis not present

## 2019-10-26 NOTE — Telephone Encounter (Signed)
Patient informed. Copy sent to PCP °

## 2019-10-26 NOTE — Telephone Encounter (Signed)
-----   Message from Satira Sark, MD sent at 10/24/2019  3:41 PM EDT ----- Results reviewed.  Renal function and potassium are stable.  Continue with current regimen.

## 2019-11-02 DIAGNOSIS — E1165 Type 2 diabetes mellitus with hyperglycemia: Secondary | ICD-10-CM | POA: Diagnosis not present

## 2019-11-06 DIAGNOSIS — Z7984 Long term (current) use of oral hypoglycemic drugs: Secondary | ICD-10-CM | POA: Diagnosis not present

## 2019-11-06 DIAGNOSIS — Z794 Long term (current) use of insulin: Secondary | ICD-10-CM | POA: Diagnosis not present

## 2019-11-06 DIAGNOSIS — H16223 Keratoconjunctivitis sicca, not specified as Sjogren's, bilateral: Secondary | ICD-10-CM | POA: Diagnosis not present

## 2019-11-06 DIAGNOSIS — Z0100 Encounter for examination of eyes and vision without abnormal findings: Secondary | ICD-10-CM | POA: Diagnosis not present

## 2019-11-06 DIAGNOSIS — E119 Type 2 diabetes mellitus without complications: Secondary | ICD-10-CM | POA: Diagnosis not present

## 2019-11-06 DIAGNOSIS — Z961 Presence of intraocular lens: Secondary | ICD-10-CM | POA: Diagnosis not present

## 2019-11-06 DIAGNOSIS — H524 Presbyopia: Secondary | ICD-10-CM | POA: Diagnosis not present

## 2019-11-13 ENCOUNTER — Telehealth: Payer: Self-pay

## 2019-11-13 NOTE — Telephone Encounter (Signed)
Spoke with patient to remind of missed remote transmission 

## 2019-11-16 ENCOUNTER — Other Ambulatory Visit: Payer: Self-pay | Admitting: Cardiology

## 2019-12-02 DIAGNOSIS — E1165 Type 2 diabetes mellitus with hyperglycemia: Secondary | ICD-10-CM | POA: Diagnosis not present

## 2019-12-02 DIAGNOSIS — I509 Heart failure, unspecified: Secondary | ICD-10-CM | POA: Diagnosis not present

## 2019-12-02 DIAGNOSIS — J449 Chronic obstructive pulmonary disease, unspecified: Secondary | ICD-10-CM | POA: Diagnosis not present

## 2019-12-02 DIAGNOSIS — E119 Type 2 diabetes mellitus without complications: Secondary | ICD-10-CM | POA: Diagnosis not present

## 2019-12-13 ENCOUNTER — Telehealth: Payer: Self-pay

## 2019-12-13 NOTE — Telephone Encounter (Signed)
Left message for patient to remind of missed remote transmission.  

## 2019-12-24 DIAGNOSIS — I10 Essential (primary) hypertension: Secondary | ICD-10-CM | POA: Diagnosis not present

## 2019-12-24 DIAGNOSIS — E1122 Type 2 diabetes mellitus with diabetic chronic kidney disease: Secondary | ICD-10-CM | POA: Diagnosis not present

## 2019-12-24 DIAGNOSIS — I5030 Unspecified diastolic (congestive) heart failure: Secondary | ICD-10-CM | POA: Diagnosis not present

## 2019-12-24 DIAGNOSIS — Z299 Encounter for prophylactic measures, unspecified: Secondary | ICD-10-CM | POA: Diagnosis not present

## 2019-12-24 DIAGNOSIS — E1165 Type 2 diabetes mellitus with hyperglycemia: Secondary | ICD-10-CM | POA: Diagnosis not present

## 2019-12-24 DIAGNOSIS — N183 Chronic kidney disease, stage 3 unspecified: Secondary | ICD-10-CM | POA: Diagnosis not present

## 2020-01-02 DIAGNOSIS — E1165 Type 2 diabetes mellitus with hyperglycemia: Secondary | ICD-10-CM | POA: Diagnosis not present

## 2020-01-02 DIAGNOSIS — I509 Heart failure, unspecified: Secondary | ICD-10-CM | POA: Diagnosis not present

## 2020-01-02 DIAGNOSIS — J449 Chronic obstructive pulmonary disease, unspecified: Secondary | ICD-10-CM | POA: Diagnosis not present

## 2020-01-02 DIAGNOSIS — E119 Type 2 diabetes mellitus without complications: Secondary | ICD-10-CM | POA: Diagnosis not present

## 2020-01-03 ENCOUNTER — Other Ambulatory Visit: Payer: Self-pay | Admitting: *Deleted

## 2020-01-03 MED ORDER — APIXABAN 5 MG PO TABS: 5.0000 mg | ORAL_TABLET | Freq: Two times a day (BID) | ORAL | 5 refills | Status: DC

## 2020-01-21 ENCOUNTER — Other Ambulatory Visit: Payer: Self-pay | Admitting: Cardiology

## 2020-01-22 ENCOUNTER — Encounter: Payer: Self-pay | Admitting: Cardiology

## 2020-01-22 ENCOUNTER — Ambulatory Visit (INDEPENDENT_AMBULATORY_CARE_PROVIDER_SITE_OTHER): Payer: Medicare HMO | Admitting: Cardiology

## 2020-01-22 VITALS — BP 124/70 | HR 76 | Ht 65.0 in | Wt 229.8 lb

## 2020-01-22 DIAGNOSIS — I4819 Other persistent atrial fibrillation: Secondary | ICD-10-CM | POA: Diagnosis not present

## 2020-01-22 DIAGNOSIS — I10 Essential (primary) hypertension: Secondary | ICD-10-CM

## 2020-01-22 DIAGNOSIS — I25119 Atherosclerotic heart disease of native coronary artery with unspecified angina pectoris: Secondary | ICD-10-CM

## 2020-01-22 DIAGNOSIS — I442 Atrioventricular block, complete: Secondary | ICD-10-CM

## 2020-01-22 NOTE — Progress Notes (Signed)
Cardiology Office Note  Date: 01/22/2020   ID: Miranda Sexton, Miranda Sexton 1941-04-08, MRN [id number]  PCP:  Glenda Chroman, MD  Cardiologist:  Rozann Lesches, MD Electrophysiologist:  [name], MD   Chief Complaint  Patient presents with  . Cardiac follow-up    History of Present Illness: Miranda Sexton is a 79 y.o. female last assessed via telehealth encounter in January.  She presents for a routine visit.  She does not report any palpitations or chest pain, no syncope.  She follows with Dr. Rayann Heman, [employer] His bundle pacemaker in place.  I reviewed his previous note from March, eventually plan for lead revision.  I reviewed her medications which are stable from a cardiac perspective and outlined below.  She does not report any bleeding problems on Eliquis.  I reviewed her lab work from April and also her ECG from March.  Past Medical History:  Diagnosis Date  . Anxiety   . Asthma   . Carotid artery occlusion   . Chronic bronchitis ([place])   . Chronic diastolic heart failure ([place])   . CKD (chronic kidney disease) stage 2, GFR 60-89 ml/min   . COPD (chronic obstructive pulmonary disease) ([place])   . Coronary atherosclerosis of native coronary artery    Nonobstructive 08/2008  . Daily headache   . DJD (degenerative joint disease)   . Essential hypertension   . GERD (gastroesophageal reflux disease)   . Hyperlipidemia   . Obesity   . On home oxygen therapy   . PAD (peripheral artery disease) (HCC)    Moderate right renal artery stenosis 08/2008  . Paroxysmal atrial fibrillation (HCC)   . Sick sinus syndrome (HCC)    [employer] dual-chamber his bundle pacemaker - Dr. Rayann Heman  . Type 2 diabetes mellitus ([place])     Past Surgical History:  Procedure Laterality Date  . BIOPSY  08/03/2017   Procedure: BIOPSY;  Surgeon: [name], MD;  [facility];  Service: Endoscopy;;  gastric   . CARDIAC CATHETERIZATION  2014  . CATARACT EXTRACTION W/ INTRAOCULAR LENS   IMPLANT, BILATERAL Bilateral 2008  . COLONOSCOPY N/A 08/03/2017   Procedure: COLONOSCOPY;  Surgeon: [name], MD;  [facility];  Service: Endoscopy;  Laterality: N/A;  . ENDARTERECTOMY Right 12/08/2012   Procedure: ENDARTERECTOMY CAROTID;  Surgeon: [name], MD;  Location: [facility];  Service: Vascular;  Laterality: Right;  . EP IMPLANTABLE DEVICE N/A 03/02/2016   Procedure: Pacemaker Implant;  Surgeon: [name], MD;  Location: [facility];  Service: Cardiovascular;  Laterality: N/A;  . ESOPHAGOGASTRODUODENOSCOPY N/A 08/03/2017   Procedure: ESOPHAGOGASTRODUODENOSCOPY (EGD);  Surgeon: [name], MD;  [facility];  Service: Endoscopy;  Laterality: N/A;  . ESOPHAGOGASTRODUODENOSCOPY (EGD) WITH PROPOFOL N/A 05/11/2019   Procedure: ESOPHAGOGASTRODUODENOSCOPY (EGD) WITH PROPOFOL;  Surgeon: [name], MD;  [facility];  Service: Endoscopy;  Laterality: N/A;  145pm  . INSERT / REPLACE / REMOVE PACEMAKER  03/02/2016  . LEFT HEART CATH AND CORONARY ANGIOGRAPHY N/A 06/20/2018   Procedure: LEFT HEART CATH AND CORONARY ANGIOGRAPHY;  Surgeon: [name], MD;  Location: [facility];  Service: Cardiovascular;  Laterality: N/A;  . PARTIAL COLECTOMY N/A 10/19/2017   Procedure: PARTIAL COLECTOMY;  Surgeon: [name], MD;  [facility];  Service: General;  Laterality: N/A;  . POLYPECTOMY  08/03/2017   Procedure: POLYPECTOMY;  Surgeon: [name], MD;  [facility];  Service: Endoscopy;;  colon    Current Outpatient Medications  Medication Sig Dispense Refill  . acetaminophen (TYLENOL) 325 MG tablet Take 325 mg by mouth every 6 (six) hours as needed for mild pain or headache.    . albuterol (PROAIR HFA) 108 (90 Base) MCG/ACT inhaler Inhale 2 puffs into the lungs every 6 (six) hours as needed for wheezing or shortness of breath.     Marland Kitchen apixaban (ELIQUIS) 5 MG TABS tablet Take 1 tablet (5 mg total) by mouth 2 (two)  times daily. 60 tablet 5  . atorvastatin (LIPITOR) 40 MG tablet Take 40 mg by mouth daily.    . benazepril (LOTENSIN) 40 MG tablet Take 40 mg by mouth daily.    . flecainide (TAMBOCOR) 50 MG tablet Take 1 tablet (50 mg total) by mouth 2 (two) times daily. 180 tablet 3  . furosemide (LASIX) 40 MG tablet Take 80 mg by mouth daily.    Marland Kitchen glipiZIDE (GLUCOTROL XL) 10 MG 24 hr tablet Take 10 mg by mouth daily.     . insulin aspart protamine- aspart (NOVOLOG 70/30) (70-30) 100 UNIT/ML injection Inject 46-48 Units into the skin See admin instructions. Inject 46 units in the morning & 48 units in the evening.    . metoprolol succinate (TOPROL-XL) 25 MG 24 hr tablet TAKE 1/2 TABLET(12.5 MG) BY MOUTH DAILY 45 tablet 1  . nitroGLYCERIN (NITROSTAT) 0.4 MG SL tablet Place 1 tablet (0.4 mg total) under the tongue every 5 (five) minutes as needed for chest pain. 30 tablet 2  . potassium chloride SA (K-DUR) 20 MEQ tablet Take 1 tablet (20 mEq total) by mouth daily. 90 tablet 0  . TRULICITY 1.61 WR/6.0AV SOPN Inject 0.5 mLs into the skin once a week.    . venlafaxine XR (EFFEXOR-XR) 150 MG 24 hr capsule Take 150 mg by mouth daily.    . vitamin B-12 (CYANOCOBALAMIN) 50 MCG tablet Take 50 mcg by mouth daily.     No current facility-administered medications for this visit.   Allergies:  Codeine and Penicillins   ROS:  No palpitations or syncope.  Physical Exam: VS:  BP 124/70   Pulse 76   Ht 5\' 5"  (1.651 m)   Wt 229 lb 12.8 oz (104.2 kg)   SpO2 99%   BMI 38.24 kg/m , BMI Body mass index is 38.24 kg/m.  Wt Readings from Last 3 Encounters:  01/22/20 229 lb 12.8 oz (104.2 kg)  09/07/19 233 lb (105.7 kg)  07/19/19 228 lb (103.4 kg)    General: Patient appears comfortable at rest. HEENT: Conjunctiva and lids normal, wearing a mask. Neck: Supple, no elevated JVP or carotid bruits, no thyromegaly. Lungs: Clear to auscultation, nonlabored breathing at rest. Cardiac: Regular rate and rhythm, no S3, soft  systolic murmur, no pericardial rub. Extremities: No pitting edema, distal pulses 2+.  ECG:  An ECG dated 09/07/2019 was personally reviewed today and demonstrated:  Dual-chamber pacing.  Recent Labwork: 07/07/2019: ALT 15; AST 18; BUN 18; Creatinine, Ser 1.05; Hemoglobin 11.3; Platelets 264; Potassium 4.4; Sodium 141     Component Value Date/Time   CHOL 97 06/18/2018 0724   TRIG 62 06/18/2018 0724   HDL 36 (L) 06/18/2018 0724   CHOLHDL 2.7 06/18/2018 0724   VLDL 12 06/18/2018 0724   LDLCALC 49 06/18/2018 0724  April 2021: BUN 17, creatinine 1.08, potassium 4.0  Other Studies Reviewed Today:  Echocardiogram 05/16/2019: 1. Left ventricular ejection fraction, by visual estimation, is 65 to  70%. The  left ventricle has hyperdynamic function. There is no left  ventricular hypertrophy.  2. Elevated left ventricular end-diastolic pressure.  3. Left ventricular diastolic parameters are indeterminate.  4. The left ventricle has no regional wall motion abnormalities.  5. Global right ventricle has normal systolic function.The right  ventricular size is normal. No increase in right ventricular wall  thickness.  6. Left atrial size was normal.  7. Right atrial size was normal.  8. Mild mitral annular calcification.  9. The mitral valve is degenerative. Moderate mitral valve regurgitation.  10. The tricuspid valve is grossly normal. Tricuspid valve regurgitation  moderate.  11. The aortic valve is tricuspid. Aortic valve regurgitation is not  visualized. Mild aortic valve stenosis.  12. There is Moderate calcification of the aortic valve.  13. There is Moderate thickening of the aortic valve.  14. The pulmonic valve was grossly normal. Pulmonic valve regurgitation is  trivial.  15. Moderately elevated pulmonary artery systolic pressure.  16. A pacer wire is visualized.  17. The inferior vena cava is normal in size with greater than 50%  respiratory variability, suggesting right  atrial pressure of 3 mmHg.   Assessment and Plan:  1.  Paroxysmal atrial fibrillation, CHA2DS2-VASc score is 6.  She is asymptomatic with no significant palpitations.  ECG from March reviewed.  Continue Toprol-XL, flecainide, and Eliquis.  Follow-up CBC and BMET for next visit.  2.  Sick sinus syndrome with [employer] His bundle pacemaker and placement, lead revision anticipated by Dr. Rayann Heman per chart review.  She does not describe any sudden dizziness or syncope.  3.  Essential hypertension, systolic 530 today.  Continue with current therapy including Lotensin.  Medication Adjustments/Labs and Tests Ordered: Current medicines are reviewed at length with the patient today.  Concerns regarding medicines are outlined above.   Tests Ordered: No orders of the defined types were placed in this encounter.   Medication Changes: No orders of the defined types were placed in this encounter.   Disposition:  Follow up 6 months in the [place] office.  Signed, Satira Sark, MD, [facility] 01/22/2020 2:14 PM    [facility] [place], [place], [place] [address] Phone: [phone]; Fax: [phone]

## 2020-01-22 NOTE — Patient Instructions (Addendum)
Medication Instructions:    Your physician recommends that you continue on your current medications as directed. Please refer to the Current Medication list given to you today.  Labwork:  Your physician recommends that you return for non-fasting lab work in: 6 months just before your next visit to check your BMET & CBC.  Testing/Procedures:  NONE  Follow-Up:  Your physician recommends that you schedule a follow-up appointment in: 6 months (office) You will receive a reminder letter in the mail in about 4 months reminding you to call and schedule your appointment. If you don't receive this letter, please contact our office.  Any Other Special Instructions Will Be Listed Below (If Applicable).  If you need a refill on your cardiac medications before your next appointment, please call your pharmacy.

## 2020-02-01 DIAGNOSIS — J449 Chronic obstructive pulmonary disease, unspecified: Secondary | ICD-10-CM | POA: Diagnosis not present

## 2020-02-01 DIAGNOSIS — I509 Heart failure, unspecified: Secondary | ICD-10-CM | POA: Diagnosis not present

## 2020-02-01 DIAGNOSIS — E119 Type 2 diabetes mellitus without complications: Secondary | ICD-10-CM | POA: Diagnosis not present

## 2020-02-18 DIAGNOSIS — I10 Essential (primary) hypertension: Secondary | ICD-10-CM | POA: Diagnosis not present

## 2020-02-18 DIAGNOSIS — I4891 Unspecified atrial fibrillation: Secondary | ICD-10-CM | POA: Diagnosis not present

## 2020-02-18 DIAGNOSIS — R109 Unspecified abdominal pain: Secondary | ICD-10-CM | POA: Diagnosis not present

## 2020-02-18 DIAGNOSIS — W57XXXA Bitten or stung by nonvenomous insect and other nonvenomous arthropods, initial encounter: Secondary | ICD-10-CM | POA: Diagnosis not present

## 2020-02-18 DIAGNOSIS — L0291 Cutaneous abscess, unspecified: Secondary | ICD-10-CM | POA: Diagnosis not present

## 2020-02-18 DIAGNOSIS — Z299 Encounter for prophylactic measures, unspecified: Secondary | ICD-10-CM | POA: Diagnosis not present

## 2020-02-21 DIAGNOSIS — Z5321 Procedure and treatment not carried out due to patient leaving prior to being seen by health care provider: Secondary | ICD-10-CM | POA: Diagnosis not present

## 2020-02-21 DIAGNOSIS — R42 Dizziness and giddiness: Secondary | ICD-10-CM | POA: Diagnosis not present

## 2020-02-22 DIAGNOSIS — W1811XA Fall from or off toilet without subsequent striking against object, initial encounter: Secondary | ICD-10-CM | POA: Diagnosis not present

## 2020-02-22 DIAGNOSIS — J449 Chronic obstructive pulmonary disease, unspecified: Secondary | ICD-10-CM | POA: Diagnosis not present

## 2020-02-22 DIAGNOSIS — L02211 Cutaneous abscess of abdominal wall: Secondary | ICD-10-CM | POA: Diagnosis not present

## 2020-02-22 DIAGNOSIS — E119 Type 2 diabetes mellitus without complications: Secondary | ICD-10-CM | POA: Diagnosis not present

## 2020-02-22 DIAGNOSIS — G4489 Other headache syndrome: Secondary | ICD-10-CM | POA: Diagnosis not present

## 2020-02-22 DIAGNOSIS — I10 Essential (primary) hypertension: Secondary | ICD-10-CM | POA: Diagnosis not present

## 2020-02-22 DIAGNOSIS — R519 Headache, unspecified: Secondary | ICD-10-CM | POA: Diagnosis not present

## 2020-02-22 DIAGNOSIS — W01198A Fall on same level from slipping, tripping and stumbling with subsequent striking against other object, initial encounter: Secondary | ICD-10-CM | POA: Diagnosis not present

## 2020-02-22 DIAGNOSIS — S0990XA Unspecified injury of head, initial encounter: Secondary | ICD-10-CM | POA: Diagnosis not present

## 2020-02-25 DIAGNOSIS — Z01419 Encounter for gynecological examination (general) (routine) without abnormal findings: Secondary | ICD-10-CM | POA: Diagnosis not present

## 2020-02-25 DIAGNOSIS — Z6839 Body mass index (BMI) 39.0-39.9, adult: Secondary | ICD-10-CM | POA: Diagnosis not present

## 2020-02-25 DIAGNOSIS — I4891 Unspecified atrial fibrillation: Secondary | ICD-10-CM | POA: Diagnosis not present

## 2020-02-25 DIAGNOSIS — I10 Essential (primary) hypertension: Secondary | ICD-10-CM | POA: Diagnosis not present

## 2020-02-25 DIAGNOSIS — I4892 Unspecified atrial flutter: Secondary | ICD-10-CM | POA: Diagnosis not present

## 2020-02-25 DIAGNOSIS — Z299 Encounter for prophylactic measures, unspecified: Secondary | ICD-10-CM | POA: Diagnosis not present

## 2020-02-25 DIAGNOSIS — R519 Headache, unspecified: Secondary | ICD-10-CM | POA: Diagnosis not present

## 2020-02-25 DIAGNOSIS — L0291 Cutaneous abscess, unspecified: Secondary | ICD-10-CM | POA: Diagnosis not present

## 2020-02-27 DIAGNOSIS — Z299 Encounter for prophylactic measures, unspecified: Secondary | ICD-10-CM | POA: Diagnosis not present

## 2020-02-27 DIAGNOSIS — R5383 Other fatigue: Secondary | ICD-10-CM | POA: Diagnosis not present

## 2020-02-27 DIAGNOSIS — Z1339 Encounter for screening examination for other mental health and behavioral disorders: Secondary | ICD-10-CM | POA: Diagnosis not present

## 2020-02-27 DIAGNOSIS — I5030 Unspecified diastolic (congestive) heart failure: Secondary | ICD-10-CM | POA: Diagnosis not present

## 2020-02-27 DIAGNOSIS — Z6839 Body mass index (BMI) 39.0-39.9, adult: Secondary | ICD-10-CM | POA: Diagnosis not present

## 2020-02-27 DIAGNOSIS — Z79899 Other long term (current) drug therapy: Secondary | ICD-10-CM | POA: Diagnosis not present

## 2020-02-27 DIAGNOSIS — Z7189 Other specified counseling: Secondary | ICD-10-CM | POA: Diagnosis not present

## 2020-02-27 DIAGNOSIS — Z1331 Encounter for screening for depression: Secondary | ICD-10-CM | POA: Diagnosis not present

## 2020-02-27 DIAGNOSIS — E7800 Pure hypercholesterolemia, unspecified: Secondary | ICD-10-CM | POA: Diagnosis not present

## 2020-02-27 DIAGNOSIS — Z0000 Encounter for general adult medical examination without abnormal findings: Secondary | ICD-10-CM | POA: Diagnosis not present

## 2020-02-27 DIAGNOSIS — I10 Essential (primary) hypertension: Secondary | ICD-10-CM | POA: Diagnosis not present

## 2020-03-04 DIAGNOSIS — L02211 Cutaneous abscess of abdominal wall: Secondary | ICD-10-CM | POA: Diagnosis not present

## 2020-03-04 DIAGNOSIS — E1165 Type 2 diabetes mellitus with hyperglycemia: Secondary | ICD-10-CM | POA: Diagnosis not present

## 2020-03-07 ENCOUNTER — Encounter: Payer: Self-pay | Admitting: Internal Medicine

## 2020-03-07 ENCOUNTER — Other Ambulatory Visit: Payer: Self-pay

## 2020-03-07 ENCOUNTER — Ambulatory Visit (INDEPENDENT_AMBULATORY_CARE_PROVIDER_SITE_OTHER): Payer: Medicare HMO | Admitting: Internal Medicine

## 2020-03-07 VITALS — BP 142/80 | HR 81 | Ht 65.0 in | Wt 225.8 lb

## 2020-03-07 DIAGNOSIS — I442 Atrioventricular block, complete: Secondary | ICD-10-CM | POA: Diagnosis not present

## 2020-03-07 DIAGNOSIS — I495 Sick sinus syndrome: Secondary | ICD-10-CM | POA: Diagnosis not present

## 2020-03-07 LAB — CUP PACEART INCLINIC DEVICE CHECK
Battery Remaining Longevity: 5 mo
Battery Voltage: 2.87 V
Brady Statistic AP VP Percent: 41.33 %
Brady Statistic AP VS Percent: 0.01 %
Brady Statistic AS VP Percent: 58.26 %
Brady Statistic AS VS Percent: 0.41 %
Brady Statistic RA Percent Paced: 36.34 %
Brady Statistic RV Percent Paced: 99.38 %
Date Time Interrogation Session: 20210903100715
Implantable Lead Implant Date: 20170829
Implantable Lead Implant Date: 20170829
Implantable Lead Location: 753859
Implantable Lead Location: 753860
Implantable Lead Model: 3830
Implantable Lead Model: 5076
Implantable Pulse Generator Implant Date: 20170829
Lead Channel Impedance Value: 266 Ohm
Lead Channel Impedance Value: 304 Ohm
Lead Channel Impedance Value: 418 Ohm
Lead Channel Impedance Value: 437 Ohm
Lead Channel Pacing Threshold Amplitude: 2 V
Lead Channel Pacing Threshold Pulse Width: 1 ms
Lead Channel Sensing Intrinsic Amplitude: 0.3 mV
Lead Channel Sensing Intrinsic Amplitude: 4.5 mV
Lead Channel Setting Pacing Amplitude: 2 V
Lead Channel Setting Pacing Amplitude: 5 V
Lead Channel Setting Pacing Pulse Width: 1 ms
Lead Channel Setting Sensing Sensitivity: 0.9 mV

## 2020-03-07 NOTE — Progress Notes (Signed)
PCP: Glenda Chroman, MD   Primary EP:  Dr Rayann Heman  Miranda Sexton is a 79 y.o. female who presents today for routine electrophysiology followup.  Since last being seen in our clinic, the patient reports doing very well.  She has returned to persistent afib, but is mostly unaware.  Energy level is good.  Today, she denies symptoms of palpitations, chest pain, shortness of breath,  lower extremity edema, dizziness, presyncope, or syncope.  The patient is otherwise without complaint today.   Past Medical History:  Diagnosis Date  . Anxiety   . Asthma   . Carotid artery occlusion   . Chronic bronchitis ([place])   . Chronic diastolic heart failure ([place])   . CKD (chronic kidney disease) stage 2, GFR 60-89 ml/min   . COPD (chronic obstructive pulmonary disease) ([place])   . Coronary atherosclerosis of native coronary artery    Nonobstructive 08/2008  . Daily headache   . DJD (degenerative joint disease)   . Essential hypertension   . GERD (gastroesophageal reflux disease)   . Hyperlipidemia   . Obesity   . On home oxygen therapy   . PAD (peripheral artery disease) (HCC)    Moderate right renal artery stenosis 08/2008  . Paroxysmal atrial fibrillation (HCC)   . Sick sinus syndrome (HCC)    Medtronic dual-chamber his bundle pacemaker - Dr. Rayann Heman  . Type 2 diabetes mellitus ([place])    Past Surgical History:  Procedure Laterality Date  . BIOPSY  08/03/2017   Procedure: BIOPSY;  Surgeon: [name], MD;  [facility];  Service: Endoscopy;;  gastric   . CARDIAC CATHETERIZATION  2014  . CATARACT EXTRACTION W/ INTRAOCULAR LENS  IMPLANT, BILATERAL Bilateral 2008  . COLONOSCOPY N/A 08/03/2017   Procedure: COLONOSCOPY;  Surgeon: [name], MD;  [facility];  Service: Endoscopy;  Laterality: N/A;  . ENDARTERECTOMY Right 12/08/2012   Procedure: ENDARTERECTOMY CAROTID;  Surgeon: [name], MD;  Location: [place];  Service: Vascular;  Laterality: Right;  . EP  IMPLANTABLE DEVICE N/A 03/02/2016   Procedure: Pacemaker Implant;  Surgeon: [name], MD;  Location: [place] [facility];  Service: Cardiovascular;  Laterality: N/A;  . ESOPHAGOGASTRODUODENOSCOPY N/A 08/03/2017   Procedure: ESOPHAGOGASTRODUODENOSCOPY (EGD);  Surgeon: [name], MD;  [facility];  Service: Endoscopy;  Laterality: N/A;  . ESOPHAGOGASTRODUODENOSCOPY (EGD) WITH PROPOFOL N/A 05/11/2019   Procedure: ESOPHAGOGASTRODUODENOSCOPY (EGD) WITH PROPOFOL;  Surgeon: [name], MD;  [facility];  Service: Endoscopy;  Laterality: N/A;  145pm  . INSERT / REPLACE / REMOVE PACEMAKER  03/02/2016  . LEFT HEART CATH AND CORONARY ANGIOGRAPHY N/A 06/20/2018   Procedure: LEFT HEART CATH AND CORONARY ANGIOGRAPHY;  Surgeon: [name], MD;  Location: [place] [facility];  Service: Cardiovascular;  Laterality: N/A;  . PARTIAL COLECTOMY N/A 10/19/2017   Procedure: PARTIAL COLECTOMY;  Surgeon: [name], MD;  [facility];  Service: General;  Laterality: N/A;  . POLYPECTOMY  08/03/2017   Procedure: POLYPECTOMY;  Surgeon: [name], MD;  [facility];  Service: Endoscopy;;  colon    ROS- all systems are reviewed and negative except as per HPI above  Current Outpatient Medications  Medication Sig Dispense Refill  . acetaminophen (TYLENOL) 325 MG tablet Take 325 mg by mouth every 6 (six) hours as needed for mild pain or headache.    . albuterol (PROAIR HFA) 108 (90 Base) MCG/ACT inhaler Inhale 2  puffs into the lungs every 6 (six) hours as needed for wheezing or shortness of breath.     Marland Kitchen apixaban (ELIQUIS) 5 MG TABS tablet Take 1 tablet (5 mg total) by mouth 2 (two) times daily. 60 tablet 5  . atorvastatin (LIPITOR) 40 MG tablet Take 40 mg by mouth daily.    . benazepril (LOTENSIN) 40 MG tablet Take 40 mg by mouth daily.    . flecainide (TAMBOCOR) 50 MG tablet Take 1 tablet (50 mg total) by mouth 2 (two) times daily. 180 tablet 3  . furosemide  (LASIX) 40 MG tablet Take 80 mg by mouth daily.    Marland Kitchen glipiZIDE (GLUCOTROL XL) 10 MG 24 hr tablet Take 10 mg by mouth daily.     . insulin aspart protamine- aspart (NOVOLOG 70/30) (70-30) 100 UNIT/ML injection Inject 46-48 Units into the skin See admin instructions. Inject 46 units in the morning & 48 units in the evening.    . metoprolol succinate (TOPROL-XL) 25 MG 24 hr tablet TAKE 1/2 TABLET(12.5 MG) BY MOUTH DAILY 45 tablet 1  . nitroGLYCERIN (NITROSTAT) 0.4 MG SL tablet Place 1 tablet (0.4 mg total) under the tongue every 5 (five) minutes as needed for chest pain. 30 tablet 2  . potassium chloride SA (K-DUR) 20 MEQ tablet Take 1 tablet (20 mEq total) by mouth daily. 90 tablet 0  . TRULICITY 1.09 NA/3.5TD SOPN Inject 0.5 mLs into the skin once a week.    . venlafaxine XR (EFFEXOR-XR) 150 MG 24 hr capsule Take 150 mg by mouth daily.    . vitamin B-12 (CYANOCOBALAMIN) 50 MCG tablet Take 50 mcg by mouth daily.     No current facility-administered medications for this visit.    Physical Exam: Vitals:   03/07/20 0946  BP: (!) 142/80  Pulse: 81  SpO2: 92%  Weight: 225 lb 12.8 oz (102.4 kg)  Height: 5\' 5"  (1.651 m)    GEN- The patient is well appearing, alert and oriented x 3 today.   Head- normocephalic, atraumatic Eyes-  Sclera clear, conjunctiva pink Ears- hearing intact Oropharynx- clear Lungs-  normal work of breathing Chest- pacemaker pocket is well healed Heart- Regular rate and rhythm (paced) GI- soft, NT, ND, + BS Extremities- no clubbing, cyanosis, or edema  Pacemaker interrogation- reviewed in detail today,  See PACEART report   Assessment and Plan:  1. Symptomatic complete heart block Normal pacemaker function See Pace Art report No changes today she is not device dependant today.  Her escape is at 31 bpm. Threshold today is 2V @ 47msec today.  She is programmed 5V @ 1 msec. She has < 6 months battery remaining.  I think that it is time to replace her RV lead.  She  has done well with His bundle pacing but with elevation in threshold.  I think that the best option is to remove her existing 3830 if possible and place a new lead via left bundle pacing. I will arrange for her to see either Dr Lovena Le or Lurline Hare to arrange for this procedure.  We discussed risks at length and she is willing to proceed. Given prior instability of threshold, I do not think that we need to wait until she is ERI to perform this procedure.  2. Persistent afib Her afib burden has increased substantially chads2vasc score is 6.  She is on eliquis. She is minimally symptomatic I am reluctant to increase flecainide currently given elevated RV lead threshold.  Once her lead is replaced we  will consider increasing flecainide and then arranging cardioversion at a later date.  3. HTN Stable No change required today  Risks, benefits and potential toxicities for medications prescribed and/or refilled reviewed with patient today.   [name] MD, [facility] 03/07/2020 9:59 AM

## 2020-03-07 NOTE — Patient Instructions (Signed)
Medication Instructions:  Continue all current medications.  Labwork: none  Testing/Procedures: none  Follow-Up: 3 months   Any Other Special Instructions Will Be Listed Below (If Applicable).  If you need a refill on your cardiac medications before your next appointment, please call your pharmacy.  

## 2020-03-11 DIAGNOSIS — L02211 Cutaneous abscess of abdominal wall: Secondary | ICD-10-CM | POA: Diagnosis not present

## 2020-03-12 ENCOUNTER — Ambulatory Visit: Payer: Medicare HMO

## 2020-03-13 ENCOUNTER — Telehealth (INDEPENDENT_AMBULATORY_CARE_PROVIDER_SITE_OTHER): Payer: Medicare HMO | Admitting: Cardiology

## 2020-03-13 ENCOUNTER — Other Ambulatory Visit: Payer: Self-pay

## 2020-03-13 ENCOUNTER — Encounter: Payer: Self-pay | Admitting: Cardiology

## 2020-03-13 VITALS — BP 148/60 | HR 87 | Ht 65.0 in | Wt 225.0 lb

## 2020-03-13 DIAGNOSIS — S31109A Unspecified open wound of abdominal wall, unspecified quadrant without penetration into peritoneal cavity, initial encounter: Secondary | ICD-10-CM | POA: Diagnosis not present

## 2020-03-13 DIAGNOSIS — I442 Atrioventricular block, complete: Secondary | ICD-10-CM

## 2020-03-13 NOTE — Progress Notes (Signed)
Virtual Visit via Telephone Note   This visit type was conducted due to national recommendations for restrictions regarding the COVID-19 Pandemic (e.g. social distancing) in an effort to limit this patient's exposure and mitigate transmission in our community.  Due to her co-morbid illnesses, this patient is at least at moderate risk for complications without adequate follow up.  This format is felt to be most appropriate for this patient at this time.  The patient did not have access to video technology/had technical difficulties with video requiring transitioning to audio format only (telephone).  All issues noted in this document were discussed and addressed.  No physical exam could be performed with this format.  Please refer to the patient's chart for her  consent to telehealth for [facility].    Date:  03/13/2020   ID:  Miranda Sexton, DOB June 03, 1941, MRN [id number] The patient was identified using 2 identifiers.  Patient Location: Home Provider Location: Office/Clinic  PCP:  Glenda Chroman, MD  Cardiologist:  Rozann Lesches, MD  Electrophysiologist:  [name], MD   Evaluation Performed:  New Patient Evaluation  Chief Complaint:  RV lead elevated threshold, generator ERI  History of Present Illness:    Miranda Sexton is a 79 y.o. female seen at the request of Dr Rayann Heman for possible RV lead extraction and reimplant with generator replacement. Her device has 5 months of estimated longevity remaining. The RV lead has elevated pacing threshold. She has an abdominal wound that her primary care and is receiving care for this at [facility]. She finished antibiotics and now is performing daily wound care. The wound is the size ofa quarter and is deep enough to probe. No fevers/chills.  The patient does not have symptoms concerning for COVID-19 infection (fever, chills, cough, or new shortness of breath).    Past Medical History:  Diagnosis Date  . Anxiety   . Asthma   .  Carotid artery occlusion   . Chronic bronchitis ([place])   . Chronic diastolic heart failure ([place])   . CKD (chronic kidney disease) stage 2, GFR 60-89 ml/min   . COPD (chronic obstructive pulmonary disease) ([place])   . Coronary atherosclerosis of native coronary artery    Nonobstructive 08/2008  . Daily headache   . DJD (degenerative joint disease)   . Essential hypertension   . GERD (gastroesophageal reflux disease)   . Hyperlipidemia   . Obesity   . On home oxygen therapy   . PAD (peripheral artery disease) (HCC)    Moderate right renal artery stenosis 08/2008  . Paroxysmal atrial fibrillation (HCC)   . Sick sinus syndrome (HCC)    Medtronic dual-chamber his bundle pacemaker - Dr. Rayann Heman  . Type 2 diabetes mellitus ([place])    Past Surgical History:  Procedure Laterality Date  . BIOPSY  08/03/2017   Procedure: BIOPSY;  Surgeon: [name], MD;  [facility];  Service: Endoscopy;;  gastric   . CARDIAC CATHETERIZATION  2014  . CATARACT EXTRACTION W/ INTRAOCULAR LENS  IMPLANT, BILATERAL Bilateral 2008  . COLONOSCOPY N/A 08/03/2017   Procedure: COLONOSCOPY;  Surgeon: [name], MD;  [facility];  Service: Endoscopy;  Laterality: N/A;  . ENDARTERECTOMY Right 12/08/2012   Procedure: ENDARTERECTOMY CAROTID;  Surgeon: [name], MD;  Location: [place];  Service: Vascular;  Laterality: Right;  . EP IMPLANTABLE DEVICE N/A 03/02/2016   Procedure: Pacemaker Implant;  Surgeon: [name], MD;  Location: [place] [facility];  Service: Cardiovascular;  Laterality: N/A;  . ESOPHAGOGASTRODUODENOSCOPY N/A 08/03/2017   Procedure: ESOPHAGOGASTRODUODENOSCOPY (EGD);  Surgeon: [name], MD;  [facility];  Service: Endoscopy;  Laterality: N/A;  . ESOPHAGOGASTRODUODENOSCOPY (EGD) WITH PROPOFOL N/A 05/11/2019   Procedure: ESOPHAGOGASTRODUODENOSCOPY (EGD) WITH PROPOFOL;  Surgeon: [name], MD;  [facility];  Service: Endoscopy;  Laterality:  N/A;  145pm  . INSERT / REPLACE / REMOVE PACEMAKER  03/02/2016  . LEFT HEART CATH AND CORONARY ANGIOGRAPHY N/A 06/20/2018   Procedure: LEFT HEART CATH AND CORONARY ANGIOGRAPHY;  Surgeon: [name], MD;  Location: [place] [facility];  Service: Cardiovascular;  Laterality: N/A;  . PARTIAL COLECTOMY N/A 10/19/2017   Procedure: PARTIAL COLECTOMY;  Surgeon: [name], MD;  [facility];  Service: General;  Laterality: N/A;  . POLYPECTOMY  08/03/2017   Procedure: POLYPECTOMY;  Surgeon: [name], MD;  [facility];  Service: Endoscopy;;  colon     Current Meds  Medication Sig  . acetaminophen (TYLENOL) 325 MG tablet Take 325 mg by mouth every 6 (six) hours as needed for mild pain or headache.  . albuterol (PROAIR HFA) 108 (90 Base) MCG/ACT inhaler Inhale 2 puffs into the lungs every 6 (six) hours as needed for wheezing or shortness of breath.   Marland Kitchen apixaban (ELIQUIS) 5 MG TABS tablet Take 1 tablet (5 mg total) by mouth 2 (two) times daily.  Marland Kitchen atorvastatin (LIPITOR) 40 MG tablet Take 40 mg by mouth daily.  . benazepril (LOTENSIN) 40 MG tablet Take 40 mg by mouth daily.  . flecainide (TAMBOCOR) 50 MG tablet Take 1 tablet (50 mg total) by mouth 2 (two) times daily.  . furosemide (LASIX) 40 MG tablet Take 80 mg by mouth daily.  Marland Kitchen glipiZIDE (GLUCOTROL XL) 10 MG 24 hr tablet Take 10 mg by mouth daily.   . insulin aspart protamine- aspart (NOVOLOG 70/30) (70-30) 100 UNIT/ML injection Inject 46-48 Units into the skin See admin instructions. Inject 46 units in the morning & 48 units in the evening.  . metoprolol succinate (TOPROL-XL) 25 MG 24 hr tablet TAKE 1/2 TABLET(12.5 MG) BY MOUTH DAILY  . nitroGLYCERIN (NITROSTAT) 0.4 MG SL tablet Place 1 tablet (0.4 mg total) under the tongue every 5 (five) minutes as needed for chest pain.  . potassium chloride SA (K-DUR) 20 MEQ tablet Take 1 tablet (20 mEq total) by mouth daily.  . TRULICITY 4.13 KG/4.0NU SOPN Inject 0.5 mLs into the  skin once a week.  . venlafaxine XR (EFFEXOR-XR) 150 MG 24 hr capsule Take 150 mg by mouth daily.  . vitamin B-12 (CYANOCOBALAMIN) 50 MCG tablet Take 50 mcg by mouth daily.     Allergies:   Codeine and Penicillins   Social History   Tobacco Use  . Smoking status: Never Smoker  . Smokeless tobacco: Never Used  Vaping Use  . Vaping Use: Never used  Substance Use Topics  . Alcohol use: No    Alcohol/week: 0.0 standard drinks  . Drug use: No     Family Hx: The patient's family history includes Cancer in her mother; Deep vein thrombosis in her mother; Diabetes in her brother, mother, and sister; Heart disease in her mother and sister; Hyperlipidemia in her brother and sister; Hypertension in her brother, father, mother, and sister.  ROS:   Please see the history of present illness.     All other systems reviewed and are negative.   Prior CV studies:   The  following studies were reviewed today:  Pacemaker interrogation personally reviewed by me from 03/07/2020 shows longevity at 82months. RV threshold 2@1ms . AF burden 100% since mid May.   05/16/2019 Echo personally reviewed by me.  1. Left ventricular ejection fraction, by visual estimation, is 65 to  70%. The left ventricle has hyperdynamic function. 2. Elevated left ventricular end-diastolic pressure.  3. Left ventricular diastolic parameters are indeterminate.  4. The left ventricle has no regional wall motion abnormalities.  5. Global right ventricle has normal systolic function.The right  ventricular size is normal. No increase in right ventricular wall  thickness.  6. Left atrial size was normal.  7. Right atrial size was normal.  8. Mild mitral annular calcification.  9. The mitral valve is degenerative. Moderate mitral valve regurgitation.  10. The tricuspid valve is grossly normal. Tricuspid valve regurgitation  moderate.  11. The aortic valve is tricuspid. Aortic valve regurgitation is not  visualized. Mild  aortic valve stenosis.  12. There is Moderate calcification of the aortic valve.  13. There is Moderate thickening of the aortic valve.  14. The pulmonic valve was grossly normal. Pulmonic valve regurgitation is  trivial.  15. Moderately elevated pulmonary artery systolic pressure.  16. A pacer wire is visualized.  17. The inferior vena cava is normal in size with greater than 50%  respiratory variability, suggesting right atrial pressure of 3 mmHg.   Labs/Other Tests and Data Reviewed:    EKG:  An ECG dated 09/07/2019 was personally reviewed today and demonstrated:  AV sequential pacing  Recent Labs: 07/07/2019: ALT 15; BUN 18; Creatinine, Ser 1.05; Hemoglobin 11.3; Platelets 264; Potassium 4.4; Sodium 141   Recent Lipid Panel Lab Results  Component Value Date/Time   CHOL 97 06/18/2018 07:24 AM   TRIG 62 06/18/2018 07:24 AM   HDL 36 (L) 06/18/2018 07:24 AM   CHOLHDL 2.7 06/18/2018 07:24 AM   LDLCALC 49 06/18/2018 07:24 AM    Wt Readings from Last 3 Encounters:  03/13/20 225 lb (102.1 kg)  03/07/20 225 lb 12.8 oz (102.4 kg)  01/22/20 229 lb 12.8 oz (104.2 kg)     Objective:    Vital Signs:  BP (!) 148/60   Pulse 87   Ht 5\' 5"  (1.651 m)   Wt 225 lb (102.1 kg)   BMI 37.44 kg/m    VITAL SIGNS:  reviewed PSYCH:  normal affect  ASSESSMENT & PLAN:    1. Device at ERI, RV lead elevated threshold It is time to replace Miranda Sexton's generator. Given the RV lead has an elevated pacing threshold, recommend we replace the lead while we replace the generator. Given the relatively young age of the lead, the risk/benefit profile of placing a new lead favors extraction of the existing lead rather than lead abandonment.  This was discussed with the patient today. I went over the procedure in great detail including the need for surgical backup, risks of extraction and the patient wishes to proceed. Given her current abdominal wall infection, will have the patient see our infectious disease  colleagues prior to the generator replacement to confirm no additional treatment is required to reduce the risk of device infection. Will plan on using a TYRX device envelope to reduce the risk of pocket infection.  - CTS backup - ID consult - Target a November procedure date given active wound  Medication Adjustments/Labs and Tests Ordered: Current medicines are reviewed at length with the patient today.  Concerns regarding medicines are outlined above.  Tests Ordered: Orders Placed This Encounter  Procedures  . Ambulatory referral to Infectious Disease    Medication Changes: No orders of the defined types were placed in this encounter.   Follow Up:  In Person prn   Signed, Vickie Epley, MD  03/13/2020 4:22 PM    [facility]    Anticoagulation instructions: Pt to hold Eliquis for 4 dose(s) prior to procedure and we will plan to resume 5 days after the procedure unless otherwise instructed after surgery.  Medication instructions morning of: The patient should take their medications the morning of the procedure, except for any diuretic (lasix, torsemide, or bumex).   Discharge: Our plan will be to observe the patient overnight.

## 2020-03-18 ENCOUNTER — Telehealth: Payer: Self-pay

## 2020-03-18 NOTE — Telephone Encounter (Signed)
May 12, 2020

## 2020-03-19 DIAGNOSIS — I509 Heart failure, unspecified: Secondary | ICD-10-CM | POA: Diagnosis not present

## 2020-03-19 DIAGNOSIS — Z794 Long term (current) use of insulin: Secondary | ICD-10-CM | POA: Diagnosis not present

## 2020-03-19 DIAGNOSIS — I110 Hypertensive heart disease with heart failure: Secondary | ICD-10-CM | POA: Diagnosis not present

## 2020-03-19 DIAGNOSIS — Z885 Allergy status to narcotic agent status: Secondary | ICD-10-CM | POA: Diagnosis not present

## 2020-03-19 DIAGNOSIS — Z79899 Other long term (current) drug therapy: Secondary | ICD-10-CM | POA: Diagnosis not present

## 2020-03-19 DIAGNOSIS — J449 Chronic obstructive pulmonary disease, unspecified: Secondary | ICD-10-CM | POA: Diagnosis not present

## 2020-03-19 DIAGNOSIS — L02211 Cutaneous abscess of abdominal wall: Secondary | ICD-10-CM | POA: Diagnosis not present

## 2020-03-19 DIAGNOSIS — S31109D Unspecified open wound of abdominal wall, unspecified quadrant without penetration into peritoneal cavity, subsequent encounter: Secondary | ICD-10-CM | POA: Diagnosis not present

## 2020-03-19 DIAGNOSIS — E119 Type 2 diabetes mellitus without complications: Secondary | ICD-10-CM | POA: Diagnosis not present

## 2020-03-19 DIAGNOSIS — Z7901 Long term (current) use of anticoagulants: Secondary | ICD-10-CM | POA: Diagnosis not present

## 2020-03-19 DIAGNOSIS — Z950 Presence of cardiac pacemaker: Secondary | ICD-10-CM | POA: Diagnosis not present

## 2020-03-21 ENCOUNTER — Other Ambulatory Visit: Payer: Self-pay

## 2020-03-21 ENCOUNTER — Ambulatory Visit (INDEPENDENT_AMBULATORY_CARE_PROVIDER_SITE_OTHER): Payer: Medicare HMO | Admitting: Internal Medicine

## 2020-03-21 ENCOUNTER — Encounter: Payer: Self-pay | Admitting: Internal Medicine

## 2020-03-21 VITALS — BP 174/71 | HR 65 | Temp 98.0°F | Ht 65.0 in | Wt 226.0 lb

## 2020-03-21 DIAGNOSIS — E119 Type 2 diabetes mellitus without complications: Secondary | ICD-10-CM

## 2020-03-21 DIAGNOSIS — I442 Atrioventricular block, complete: Secondary | ICD-10-CM

## 2020-03-21 DIAGNOSIS — Z794 Long term (current) use of insulin: Secondary | ICD-10-CM

## 2020-03-21 DIAGNOSIS — L02211 Cutaneous abscess of abdominal wall: Secondary | ICD-10-CM | POA: Diagnosis not present

## 2020-03-21 LAB — CUP PACEART REMOTE DEVICE CHECK
Battery Remaining Longevity: 4 mo
Battery Voltage: 2.87 V
Brady Statistic RA Percent Paced: 0.52 %
Brady Statistic RV Percent Paced: 95.97 %
Date Time Interrogation Session: 20210909105032
Implantable Lead Implant Date: 20170829
Implantable Lead Implant Date: 20170829
Implantable Lead Location: 753859
Implantable Lead Location: 753860
Implantable Lead Model: 3830
Implantable Lead Model: 5076
Implantable Pulse Generator Implant Date: 20170829
Lead Channel Impedance Value: 266 Ohm
Lead Channel Impedance Value: 285 Ohm
Lead Channel Impedance Value: 399 Ohm
Lead Channel Impedance Value: 418 Ohm
Lead Channel Pacing Threshold Amplitude: 0.75 V
Lead Channel Pacing Threshold Amplitude: 2.5 V
Lead Channel Pacing Threshold Pulse Width: 0.4 ms
Lead Channel Pacing Threshold Pulse Width: 0.4 ms
Lead Channel Sensing Intrinsic Amplitude: 0.25 mV
Lead Channel Sensing Intrinsic Amplitude: 0.25 mV
Lead Channel Sensing Intrinsic Amplitude: 4.25 mV
Lead Channel Sensing Intrinsic Amplitude: 4.25 mV
Lead Channel Setting Pacing Amplitude: 2 V
Lead Channel Setting Pacing Amplitude: 5 V
Lead Channel Setting Pacing Pulse Width: 1 ms
Lead Channel Setting Sensing Sensitivity: 0.9 mV

## 2020-03-21 NOTE — Progress Notes (Signed)
[place] for Infectious Disease  Reason for Consult: abdominal wall abscess Referring Provider: Dr Quentin Ore  HPI:  Miranda Sexton is a 79 y.o. female who presents to clinic for further evaluation of abdominal wall abscess.   Past medical history as below.  Patient presents to clinic today at the request of her cardiologist (Dr. Lars Mage) for clearance prior to generator lead replacement in the setting of abdominal wall abscess.  She has a small abdominal wound and has been receiving care for this at [facility] and following with wound care.  She completed a course of antibiotics previously with doxycycline x 10 days and has done daily wound care.  She denies any current fever or chills.  No drains are in place.  The wound was initially an abdominal wall abscess that was debrided 03/04/20 after popping up over a few days and measured 2.5x1.1x2 cm.  She has been following with Dr Rocco Serene with general surgery and saw her again most recently on 03/19/20.  There was initially no trauma to the area and she does not inject insulin in her abdomen.  She noted that patients wound is healing well and had improved substantially in the last week.  Wound care was continued and she will follow up again in about 3 weeks.  Wound measured that day at 2.4x0.7x0.1cm.  Patient's Medications  New Prescriptions   No medications on file  Previous Medications   ACETAMINOPHEN (TYLENOL) 325 MG TABLET    Take 325 mg by mouth every 6 (six) hours as needed for mild pain or headache.   ALBUTEROL (PROAIR HFA) 108 (90 BASE) MCG/ACT INHALER    Inhale 2 puffs into the lungs every 6 (six) hours as needed for wheezing or shortness of breath.    APIXABAN (ELIQUIS) 5 MG TABS TABLET    Take 1 tablet (5 mg total) by mouth 2 (two) times daily.   ATORVASTATIN (LIPITOR) 40 MG TABLET    Take 40 mg by mouth daily.   BENAZEPRIL (LOTENSIN) 40 MG TABLET    Take 40 mg by mouth daily.   FLECAINIDE (TAMBOCOR) 50 MG TABLET     Take 1 tablet (50 mg total) by mouth 2 (two) times daily.   FUROSEMIDE (LASIX) 40 MG TABLET    Take 80 mg by mouth daily.   GLIPIZIDE (GLUCOTROL XL) 10 MG 24 HR TABLET    Take 10 mg by mouth daily.    INSULIN ASPART PROTAMINE- ASPART (NOVOLOG 70/30) (70-30) 100 UNIT/ML INJECTION    Inject 46-48 Units into the skin See admin instructions. Inject 46 units in the morning & 48 units in the evening.   METOPROLOL SUCCINATE (TOPROL-XL) 25 MG 24 HR TABLET    TAKE 1/2 TABLET(12.5 MG) BY MOUTH DAILY   NITROGLYCERIN (NITROSTAT) 0.4 MG SL TABLET    Place 1 tablet (0.4 mg total) under the tongue every 5 (five) minutes as needed for chest pain.   ONETOUCH VERIO TEST STRIP    2 (two) times daily.   POTASSIUM CHLORIDE SA (K-DUR) 20 MEQ TABLET    Take 1 tablet (20 mEq total) by mouth daily.   TRULICITY 1.61 WR/6.0AV SOPN    Inject 0.5 mLs into the skin once a week.   VENLAFAXINE XR (EFFEXOR-XR) 150 MG 24 HR CAPSULE    Take 150 mg by mouth daily.   VITAMIN B-12 (CYANOCOBALAMIN) 50 MCG TABLET    Take 50 mcg by mouth daily.  Modified Medications   No  medications on file  Discontinued Medications   No medications on file      Past Medical History:  Diagnosis Date  . Anxiety   . Asthma   . Carotid artery occlusion   . Chronic bronchitis ([place])   . Chronic diastolic heart failure ([place])   . CKD (chronic kidney disease) stage 2, GFR 60-89 ml/min   . COPD (chronic obstructive pulmonary disease) ([place])   . Coronary atherosclerosis of native coronary artery    Nonobstructive 08/2008  . Daily headache   . DJD (degenerative joint disease)   . Essential hypertension   . GERD (gastroesophageal reflux disease)   . Hyperlipidemia   . Obesity   . On home oxygen therapy   . PAD (peripheral artery disease) (HCC)    Moderate right renal artery stenosis 08/2008  . Paroxysmal atrial fibrillation (HCC)   . Sick sinus syndrome (HCC)    Medtronic dual-chamber his bundle pacemaker - Dr. Rayann Heman  . Type 2 diabetes mellitus  (HCC)     Social History   Tobacco Use  . Smoking status: Never Smoker  . Smokeless tobacco: Never Used  Vaping Use  . Vaping Use: Never used  Substance Use Topics  . Alcohol use: No    Alcohol/week: 0.0 standard drinks  . Drug use: No    Family History  Problem Relation Age of Onset  . Cancer Mother        Stomach  . Deep vein thrombosis Mother   . Diabetes Mother   . Hypertension Mother   . Heart disease Mother   . Hypertension Father   . Diabetes Sister   . Hyperlipidemia Sister   . Hypertension Sister   . Heart disease Sister   . Diabetes Brother   . Hyperlipidemia Brother   . Hypertension Brother     Allergies  Allergen Reactions  . Codeine Nausea And Vomiting  . Penicillins Rash and Other (See Comments)    Has patient had a PCN reaction causing immediate rash, facial/tongue/throat swelling, SOB or lightheadedness with hypotension: Yes Has patient had a PCN reaction causing severe rash involving mucus membranes or skin necrosis: No Has patient had a PCN reaction that required hospitalization No Has patient had a PCN reaction occurring within the last 10 years: No If all of the above answers are "NO", then may proceed with Cephalosporin use.     Review of Systems: Review of Systems  Constitutional: Positive for chills (will occasionally "catch a chill"). Negative for fever.  Respiratory: Negative for cough and shortness of breath.   Cardiovascular: Negative for chest pain.  Gastrointestinal: Negative for abdominal pain, nausea and vomiting.  Musculoskeletal: Negative.   Skin:       + abdominal wound  Neurological: Negative.   All other systems reviewed and are negative.   Objective: Vitals:   03/21/20 1050  BP: (!) 174/71  Pulse: 65  Temp: 98 F (36.7 C)  TempSrc: Oral  SpO2: 97%  Weight: 226 lb (102.5 kg)  Height: 5\' 5"  (1.651 m)     Body mass index is 37.61 kg/m.   Physical Exam Constitutional:      Appearance: Normal appearance.   HENT:     Head: Normocephalic and atraumatic.  Pulmonary:     Effort: Pulmonary effort is normal. No respiratory distress.  Skin:    General: Skin is warm and dry.     Comments: Small 1.5x0.5cm wound on LLQ of abdomen.  Healthy appearing tissue with pink granulation.  No surrounding  erythema, warmth, or tenderness.   Neurological:     General: No focal deficit present.     Mental Status: She is alert and oriented to person, place, and time.     Pertinent Labs and Microbiology:   CBC Latest Ref Rng & Units 07/07/2019 03/28/2019 06/21/2018  WBC 4.0 - 10.5 K/uL 5.3 6.8 5.9  Hemoglobin 12.0 - 15.0 g/dL 11.3(L) 11.3(L) 9.9(L)  Hematocrit 36 - 46 % 37.8 36.1 32.4(L)  Platelets 150 - 400 K/uL 264 350 308   CMP Latest Ref Rng & Units 07/07/2019 05/28/2019 03/28/2019  Glucose 70 - 99 mg/dL 111(H) 197(H) 108  BUN 8 - 23 mg/dL 18 21 20   Creatinine 0.44 - 1.00 mg/dL 1.05(H) 1.44(H) 1.30(H)  Sodium 135 - 145 mmol/L 141 139 141  Potassium 3.5 - 5.1 mmol/L 4.4 4.6 4.2  Chloride 98 - 111 mmol/L 102 100 101  CO2 22 - 32 mmol/L 30 29 33(H)  Calcium 8.9 - 10.3 mg/dL 8.9 9.3 9.0  Total Protein 6.5 - 8.1 g/dL 6.9 - 7.3  Total Bilirubin 0.3 - 1.2 mg/dL 0.4 - 0.3  Alkaline Phos 38 - 126 U/L 72 - -  AST 15 - 41 U/L 18 - 15  ALT 0 - 44 U/L 15 - 11     No results found for this or any previous visit (from the past 240 hour(s)).   All pertinent labs/notes reviewed. Decision making incorporated into the Assessment and Plan.  Assessment and Plan: Abscess of abdominal wall Assessment: Her abdominal wound appears to be healing very well with the local wound care she has been providing.  She had appropriate source control with debridement on 03/04/20 along with a 10-day course of antibiotics.  No current signs or symptoms concerning for ongoing infection.  Plan: -- do not think she needs further abx at this point.  Recommended continued wound care that she has been providing -- continue wound care --  glycemic control -- no ID contraindication to proceeding with generator lead replacement  1. Heart block AV complete ([place]) 2. Type 2 diabetes mellitus without complication, with long-term current use of insulin (HCC) -- as above   I spent greater than 45 minutes with the patient including greater than 50% of time in face to face counsel of the patient and in coordination of their care.   Raynelle [facility] for Infectious Disease [facility] 03/21/2020, 11:23 AM

## 2020-03-21 NOTE — Patient Instructions (Signed)
Thank you for coming to see me today. It was a pleasure.   Today we talked about:  -- your wound is looking good.  I do not think you need any more antibiotics and can have your procedure without a problem  If you have any questions or concerns, please do not hesitate to call the office at [phone].  Take Care,   Jule Ser, DO

## 2020-03-21 NOTE — Assessment & Plan Note (Signed)
Assessment: Her abdominal wound appears to be healing very well with the local wound care she has been providing.  She had appropriate source control with debridement on 03/04/20 along with a 10-day course of antibiotics.  No current signs or symptoms concerning for ongoing infection.  Plan: -- do not think she needs further abx at this point.  Recommended continued wound care that she has been providing -- continue wound care -- glycemic control -- no ID contraindication to proceeding with generator lead replacement

## 2020-04-02 NOTE — Telephone Encounter (Signed)
Attempted outreach.  Per DPR unable to leave message.

## 2020-04-03 DIAGNOSIS — J449 Chronic obstructive pulmonary disease, unspecified: Secondary | ICD-10-CM | POA: Diagnosis not present

## 2020-04-03 DIAGNOSIS — E119 Type 2 diabetes mellitus without complications: Secondary | ICD-10-CM | POA: Diagnosis not present

## 2020-04-03 DIAGNOSIS — E1165 Type 2 diabetes mellitus with hyperglycemia: Secondary | ICD-10-CM | POA: Diagnosis not present

## 2020-04-03 DIAGNOSIS — I509 Heart failure, unspecified: Secondary | ICD-10-CM | POA: Diagnosis not present

## 2020-04-07 NOTE — Telephone Encounter (Signed)
Call placed to Pt.  Tentatively scheduled with GT for October 22 to discuss lead extraction.  Pt will check with daughter and call back to confirm appt.

## 2020-04-09 DIAGNOSIS — I509 Heart failure, unspecified: Secondary | ICD-10-CM | POA: Diagnosis not present

## 2020-04-09 DIAGNOSIS — I110 Hypertensive heart disease with heart failure: Secondary | ICD-10-CM | POA: Diagnosis not present

## 2020-04-09 DIAGNOSIS — Z7901 Long term (current) use of anticoagulants: Secondary | ICD-10-CM | POA: Diagnosis not present

## 2020-04-09 DIAGNOSIS — Z79899 Other long term (current) drug therapy: Secondary | ICD-10-CM | POA: Diagnosis not present

## 2020-04-09 DIAGNOSIS — L02211 Cutaneous abscess of abdominal wall: Secondary | ICD-10-CM | POA: Diagnosis not present

## 2020-04-09 DIAGNOSIS — J449 Chronic obstructive pulmonary disease, unspecified: Secondary | ICD-10-CM | POA: Diagnosis not present

## 2020-04-09 DIAGNOSIS — S31109D Unspecified open wound of abdominal wall, unspecified quadrant without penetration into peritoneal cavity, subsequent encounter: Secondary | ICD-10-CM | POA: Diagnosis not present

## 2020-04-09 DIAGNOSIS — Z794 Long term (current) use of insulin: Secondary | ICD-10-CM | POA: Diagnosis not present

## 2020-04-09 DIAGNOSIS — E119 Type 2 diabetes mellitus without complications: Secondary | ICD-10-CM | POA: Diagnosis not present

## 2020-04-09 DIAGNOSIS — Z950 Presence of cardiac pacemaker: Secondary | ICD-10-CM | POA: Diagnosis not present

## 2020-04-09 DIAGNOSIS — Z885 Allergy status to narcotic agent status: Secondary | ICD-10-CM | POA: Diagnosis not present

## 2020-04-18 NOTE — Telephone Encounter (Signed)
Call placed to daughter.  Advised of Pt's upcoming procedure.  Pt's daughter will come to appt on October 22 with Pt so she has a better understanding of the surgical plan.  Advised tentative surgical date is November 8.  Daughter thanked nurse for calling her, Pt has not been able to communicate to her what the procedure entails.  Will meet with daughter/Pt on October 22.

## 2020-04-25 ENCOUNTER — Other Ambulatory Visit: Payer: Self-pay

## 2020-04-25 ENCOUNTER — Encounter: Payer: Self-pay | Admitting: Internal Medicine

## 2020-04-25 ENCOUNTER — Ambulatory Visit (INDEPENDENT_AMBULATORY_CARE_PROVIDER_SITE_OTHER): Payer: Medicare HMO | Admitting: Internal Medicine

## 2020-04-25 VITALS — BP 128/66 | HR 84 | Ht 65.0 in | Wt 220.8 lb

## 2020-04-25 DIAGNOSIS — I495 Sick sinus syndrome: Secondary | ICD-10-CM | POA: Diagnosis not present

## 2020-04-25 DIAGNOSIS — Z950 Presence of cardiac pacemaker: Secondary | ICD-10-CM | POA: Diagnosis not present

## 2020-04-25 DIAGNOSIS — T82110A Breakdown (mechanical) of cardiac electrode, initial encounter: Secondary | ICD-10-CM | POA: Insufficient documentation

## 2020-04-25 LAB — CBC WITH DIFFERENTIAL/PLATELET
Basophils Absolute: 0.1 10*3/uL (ref 0.0–0.2)
Basos: 1 %
EOS (ABSOLUTE): 0.5 10*3/uL — ABNORMAL HIGH (ref 0.0–0.4)
Eos: 9 %
Hematocrit: 41.4 % (ref 34.0–46.6)
Hemoglobin: 13 g/dL (ref 11.1–15.9)
Immature Grans (Abs): 0 10*3/uL (ref 0.0–0.1)
Immature Granulocytes: 0 %
Lymphocytes Absolute: 1.5 10*3/uL (ref 0.7–3.1)
Lymphs: 25 %
MCH: 29 pg (ref 26.6–33.0)
MCHC: 31.4 g/dL — ABNORMAL LOW (ref 31.5–35.7)
MCV: 92 fL (ref 79–97)
Monocytes Absolute: 0.7 10*3/uL (ref 0.1–0.9)
Monocytes: 12 %
Neutrophils Absolute: 3.1 10*3/uL (ref 1.4–7.0)
Neutrophils: 53 %
Platelets: 279 10*3/uL (ref 150–450)
RBC: 4.48 x10E6/uL (ref 3.77–5.28)
RDW: 14.4 % (ref 11.7–15.4)
WBC: 5.8 10*3/uL (ref 3.4–10.8)

## 2020-04-25 LAB — CUP PACEART INCLINIC DEVICE CHECK
Battery Remaining Longevity: 2 mo
Battery Voltage: 2.85 V
Brady Statistic RA Percent Paced: 0.66 %
Brady Statistic RV Percent Paced: 98.85 %
Date Time Interrogation Session: 20211022084615
Implantable Lead Implant Date: 20170829
Implantable Lead Implant Date: 20170829
Implantable Lead Location: 753859
Implantable Lead Location: 753860
Implantable Lead Model: 3830
Implantable Lead Model: 5076
Implantable Pulse Generator Implant Date: 20170829
Lead Channel Impedance Value: 266 Ohm
Lead Channel Impedance Value: 304 Ohm
Lead Channel Impedance Value: 418 Ohm
Lead Channel Impedance Value: 437 Ohm
Lead Channel Pacing Threshold Amplitude: 0.75 V
Lead Channel Pacing Threshold Amplitude: 2.5 V
Lead Channel Pacing Threshold Pulse Width: 0.4 ms
Lead Channel Pacing Threshold Pulse Width: 1 ms
Lead Channel Sensing Intrinsic Amplitude: 0.25 mV
Lead Channel Sensing Intrinsic Amplitude: 0.375 mV
Lead Channel Sensing Intrinsic Amplitude: 4.875 mV
Lead Channel Setting Pacing Amplitude: 2 V
Lead Channel Setting Pacing Amplitude: 5 V
Lead Channel Setting Pacing Pulse Width: 1 ms
Lead Channel Setting Sensing Sensitivity: 0.9 mV

## 2020-04-25 LAB — BASIC METABOLIC PANEL
BUN/Creatinine Ratio: 13 (ref 12–28)
BUN: 15 mg/dL (ref 8–27)
CO2: 29 mmol/L (ref 20–29)
Calcium: 9.5 mg/dL (ref 8.7–10.3)
Chloride: 99 mmol/L (ref 96–106)
Creatinine, Ser: 1.14 mg/dL — ABNORMAL HIGH (ref 0.57–1.00)
GFR calc Af Amer: 53 mL/min/{1.73_m2} — ABNORMAL LOW (ref >59)
GFR calc non Af Amer: 46 mL/min/{1.73_m2} — ABNORMAL LOW (ref >59)
Glucose: 59 mg/dL — ABNORMAL LOW (ref 65–99)
Potassium: 3.9 mmol/L (ref 3.5–5.2)
Sodium: 140 mmol/L (ref 134–144)

## 2020-04-25 NOTE — Patient Instructions (Signed)
Medication Instructions:  Your physician recommends that you continue on your current medications as directed. Please refer to the Current Medication list given to you today.  Labwork: You will get lab work today:  BMP and CBC  Testing/Procedures: None ordered.  Follow-Up:  SEE INSTRUCTION LETTER  Any Other Special Instructions Will Be Listed Below (If Applicable).  If you need a refill on your cardiac medications before your next appointment, please call your pharmacy.   

## 2020-04-25 NOTE — H&P (View-Only) (Signed)
HPI Mrs. Schissler presents today to discuss PM lead extraction in more detail. She is a pleasant 79 yo woman with CHB, s/p PPM insertion. She has a his bundle lead position and has been found to have increasing thresholds. She is approaching ERI on her device. Her threshold has been gradually increasing. Allergies  Allergen Reactions  . Codeine Nausea And Vomiting  . Penicillins Rash and Other (See Comments)    Has patient had a PCN reaction causing immediate rash, facial/tongue/throat swelling, SOB or lightheadedness with hypotension: Yes Has patient had a PCN reaction causing severe rash involving mucus membranes or skin necrosis: No Has patient had a PCN reaction that required hospitalization No Has patient had a PCN reaction occurring within the last 10 years: No If all of the above answers are "NO", then may proceed with Cephalosporin use.      Current Outpatient Medications  Medication Sig Dispense Refill  . acetaminophen (TYLENOL) 325 MG tablet Take 325 mg by mouth every 6 (six) hours as needed for mild pain or headache.    . albuterol (PROAIR HFA) 108 (90 Base) MCG/ACT inhaler Inhale 2 puffs into the lungs every 6 (six) hours as needed for wheezing or shortness of breath.     Marland Kitchen apixaban (ELIQUIS) 5 MG TABS tablet Take 1 tablet (5 mg total) by mouth 2 (two) times daily. 60 tablet 5  . atorvastatin (LIPITOR) 40 MG tablet Take 40 mg by mouth daily.    . benazepril (LOTENSIN) 40 MG tablet Take 40 mg by mouth daily.    . flecainide (TAMBOCOR) 50 MG tablet Take 1 tablet (50 mg total) by mouth 2 (two) times daily. 180 tablet 3  . furosemide (LASIX) 40 MG tablet Take 80 mg by mouth daily.    Marland Kitchen glipiZIDE (GLUCOTROL XL) 10 MG 24 hr tablet Take 10 mg by mouth daily.     . insulin aspart protamine- aspart (NOVOLOG 70/30) (70-30) 100 UNIT/ML injection Inject 46-48 Units into the skin See admin instructions. Inject 46 units in the morning & 48 units in the evening.    . metoprolol  succinate (TOPROL-XL) 25 MG 24 hr tablet TAKE 1/2 TABLET(12.5 MG) BY MOUTH DAILY 45 tablet 1  . nitroGLYCERIN (NITROSTAT) 0.4 MG SL tablet Place 1 tablet (0.4 mg total) under the tongue every 5 (five) minutes as needed for chest pain. 30 tablet 2  . ONETOUCH VERIO test strip 2 (two) times daily.    . potassium chloride SA (K-DUR) 20 MEQ tablet Take 1 tablet (20 mEq total) by mouth daily. 90 tablet 0  . TRULICITY 3.24 MW/1.0UV SOPN Inject 0.5 mLs into the skin once a week.    . venlafaxine XR (EFFEXOR-XR) 150 MG 24 hr capsule Take 150 mg by mouth daily.    . vitamin B-12 (CYANOCOBALAMIN) 50 MCG tablet Take 50 mcg by mouth daily.     No current facility-administered medications for this visit.     Past Medical History:  Diagnosis Date  . Anxiety   . Asthma   . Carotid artery occlusion   . Chronic bronchitis ([place])   . Chronic diastolic heart failure ([place])   . CKD (chronic kidney disease) stage 2, GFR 60-89 ml/min   . COPD (chronic obstructive pulmonary disease) ([place])   . Coronary atherosclerosis of native coronary artery    Nonobstructive 08/2008  . Daily headache   . DJD (degenerative joint disease)   . Essential hypertension   . GERD (gastroesophageal reflux disease)   .  Hyperlipidemia   . Obesity   . On home oxygen therapy   . PAD (peripheral artery disease) (HCC)    Moderate right renal artery stenosis 08/2008  . Paroxysmal atrial fibrillation (HCC)   . Sick sinus syndrome (HCC)    Medtronic dual-chamber his bundle pacemaker - Dr. Rayann Heman  . Type 2 diabetes mellitus (HCC)     ROS:   All systems reviewed and negative except as noted in the HPI.   Past Surgical History:  Procedure Laterality Date  . BIOPSY  08/03/2017   Procedure: BIOPSY;  Surgeon: [name], MD;  [facility];  Service: Endoscopy;;  gastric   . CARDIAC CATHETERIZATION  2014  . CATARACT EXTRACTION W/ INTRAOCULAR LENS  IMPLANT, BILATERAL Bilateral 2008  . COLONOSCOPY N/A 08/03/2017    Procedure: COLONOSCOPY;  Surgeon: [name], MD;  [facility];  Service: Endoscopy;  Laterality: N/A;  . ENDARTERECTOMY Right 12/08/2012   Procedure: ENDARTERECTOMY CAROTID;  Surgeon: [name], MD;  Location: [facility];  Service: Vascular;  Laterality: Right;  . EP IMPLANTABLE DEVICE N/A 03/02/2016   Procedure: Pacemaker Implant;  Surgeon: [name], MD;  Location: [facility];  Service: Cardiovascular;  Laterality: N/A;  . ESOPHAGOGASTRODUODENOSCOPY N/A 08/03/2017   Procedure: ESOPHAGOGASTRODUODENOSCOPY (EGD);  Surgeon: [name], MD;  [facility];  Service: Endoscopy;  Laterality: N/A;  . ESOPHAGOGASTRODUODENOSCOPY (EGD) WITH PROPOFOL N/A 05/11/2019   Procedure: ESOPHAGOGASTRODUODENOSCOPY (EGD) WITH PROPOFOL;  Surgeon: [name], MD;  [facility];  Service: Endoscopy;  Laterality: N/A;  145pm  . INSERT / REPLACE / REMOVE PACEMAKER  03/02/2016  . LEFT HEART CATH AND CORONARY ANGIOGRAPHY N/A 06/20/2018   Procedure: LEFT HEART CATH AND CORONARY ANGIOGRAPHY;  Surgeon: [name], MD;  Location: [facility];  Service: Cardiovascular;  Laterality: N/A;  . PARTIAL COLECTOMY N/A 10/19/2017   Procedure: PARTIAL COLECTOMY;  Surgeon: [name], MD;  Location: [facility];  Service: General;  Laterality: N/A;  . POLYPECTOMY  08/03/2017   Procedure: POLYPECTOMY;  Surgeon: [name], MD;  [facility];  Service: Endoscopy;;  colon     Family History  Problem Relation Age of Onset  . Cancer Mother        Stomach  . Deep vein thrombosis Mother   . Diabetes Mother   . Hypertension Mother   . Heart disease Mother   . Hypertension Father   . Diabetes Sister   . Hyperlipidemia Sister   . Hypertension Sister   . Heart disease Sister   . Diabetes Brother   . Hyperlipidemia Brother   . Hypertension Brother      Social History   Socioeconomic History  . Marital status: Married    Spouse name: Not on file  .  Number of children: Not on file  . Years of education: Not on file  . Highest education level: Not on file  Occupational History  . Not on file  Tobacco Use  . Smoking status: Never Smoker  . Smokeless tobacco: Never Used  Vaping Use  . Vaping Use: Never used  Substance and Sexual Activity  . Alcohol use: No    Alcohol/week: 0.0 standard drinks  . Drug use: No  . Sexual activity: Not Currently    Birth control/protection: None  Other Topics Concern  . Not on file  Social History Narrative   Lives in [place] with spouse.  4 grown children.  Retired but continues to clean houses   Social Determinants of Radio broadcast assistant Strain:   . Difficulty of Paying Living Expenses: Not on file  Food Insecurity:   . Worried About Charity fundraiser in the Last Year: Not on file  . Ran Out of Food in the Last Year: Not on file  Transportation Needs:   . Lack of Transportation (Medical): Not on file  . Lack of Transportation (Non-Medical): Not on file  Physical Activity:   . Days of Exercise per Week: Not on file  . Minutes of Exercise per Session: Not on file  Stress:   . Feeling of Stress : Not on file  Social Connections:   . Frequency of Communication with Friends and Family: Not on file  . Frequency of Social Gatherings with Friends and Family: Not on file  . Attends Religious Services: Not on file  . Active Member of Clubs or Organizations: Not on file  . Attends Archivist Meetings: Not on file  . Marital Status: Not on file  Intimate Partner Violence:   . Fear of Current or Ex-Partner: Not on file  . Emotionally Abused: Not on file  . Physically Abused: Not on file  . Sexually Abused: Not on file     BP 128/66   Pulse 84   Ht 5\' 5"  (1.651 m)   Wt 220 lb 12.8 oz (100.2 kg)   SpO2 95%   BMI 36.74 kg/m   Physical Exam:  Well appearing NAD HEENT: Unremarkable Neck:  No JVD, no thyromegally Lymphatics:  No adenopathy Back:  No CVA  tenderness Lungs:  Clear with no wheezes HEART:  Regular rate rhythm, no murmurs, no rubs, no clicks Abd:  soft, positive bowel sounds, no organomegally, no rebound, no guarding, healed abdominal wound Ext:  2 plus pulses, no edema, no cyanosis, no clubbing Skin:  No rashes no nodules Neuro:  CN II through XII intact, motor grossly intact  EKG - atrial fib with ventricular pacing  DEVICE  Normal device function.  See PaceArt for details.   Assess/Plan: 1. PM lead dysfunction - her threshold remains elevated. I have recommended PM system extraction and discussed the indications/risks/benefits/goals/expectations and she wishes to proceed. 2. Atrial fib - we will hold her anti-coagulation for a couple of days prior to her procedure.  3. CHB - she has an escape today of 35/min. We will follow.  Carleene Overlie Ceola Para,MD

## 2020-04-25 NOTE — Progress Notes (Signed)
HPI Miranda Sexton presents today to discuss PM lead extraction in more detail. She is a pleasant 79 yo woman with CHB, s/p PPM insertion. She has a his bundle lead position and has been found to have increasing thresholds. She is approaching ERI on her device. Her threshold has been gradually increasing. Allergies  Allergen Reactions  . Codeine Nausea And Vomiting  . Penicillins Rash and Other (See Comments)    Has patient had a PCN reaction causing immediate rash, facial/tongue/throat swelling, SOB or lightheadedness with hypotension: Yes Has patient had a PCN reaction causing severe rash involving mucus membranes or skin necrosis: No Has patient had a PCN reaction that required hospitalization No Has patient had a PCN reaction occurring within the last 10 years: No If all of the above answers are "NO", then may proceed with Cephalosporin use.      Current Outpatient Medications  Medication Sig Dispense Refill  . acetaminophen (TYLENOL) 325 MG tablet Take 325 mg by mouth every 6 (six) hours as needed for mild pain or headache.    . albuterol (PROAIR HFA) 108 (90 Base) MCG/ACT inhaler Inhale 2 puffs into the lungs every 6 (six) hours as needed for wheezing or shortness of breath.     Marland Kitchen apixaban (ELIQUIS) 5 MG TABS tablet Take 1 tablet (5 mg total) by mouth 2 (two) times daily. 60 tablet 5  . atorvastatin (LIPITOR) 40 MG tablet Take 40 mg by mouth daily.    . benazepril (LOTENSIN) 40 MG tablet Take 40 mg by mouth daily.    . flecainide (TAMBOCOR) 50 MG tablet Take 1 tablet (50 mg total) by mouth 2 (two) times daily. 180 tablet 3  . furosemide (LASIX) 40 MG tablet Take 80 mg by mouth daily.    Marland Kitchen glipiZIDE (GLUCOTROL XL) 10 MG 24 hr tablet Take 10 mg by mouth daily.     . insulin aspart protamine- aspart (NOVOLOG 70/30) (70-30) 100 UNIT/ML injection Inject 46-48 Units into the skin See admin instructions. Inject 46 units in the morning & 48 units in the evening.    . metoprolol  succinate (TOPROL-XL) 25 MG 24 hr tablet TAKE 1/2 TABLET(12.5 MG) BY MOUTH DAILY 45 tablet 1  . nitroGLYCERIN (NITROSTAT) 0.4 MG SL tablet Place 1 tablet (0.4 mg total) under the tongue every 5 (five) minutes as needed for chest pain. 30 tablet 2  . ONETOUCH VERIO test strip 2 (two) times daily.    . potassium chloride SA (K-DUR) 20 MEQ tablet Take 1 tablet (20 mEq total) by mouth daily. 90 tablet 0  . TRULICITY 1.74 BS/4.9QP SOPN Inject 0.5 mLs into the skin once a week.    . venlafaxine XR (EFFEXOR-XR) 150 MG 24 hr capsule Take 150 mg by mouth daily.    . vitamin B-12 (CYANOCOBALAMIN) 50 MCG tablet Take 50 mcg by mouth daily.     No current facility-administered medications for this visit.     Past Medical History:  Diagnosis Date  . Anxiety   . Asthma   . Carotid artery occlusion   . Chronic bronchitis ([place])   . Chronic diastolic heart failure ([place])   . CKD (chronic kidney disease) stage 2, GFR 60-89 ml/min   . COPD (chronic obstructive pulmonary disease) ([place])   . Coronary atherosclerosis of native coronary artery    Nonobstructive 08/2008  . Daily headache   . DJD (degenerative joint disease)   . Essential hypertension   . GERD (gastroesophageal reflux disease)   .  Hyperlipidemia   . Obesity   . On home oxygen therapy   . PAD (peripheral artery disease) (HCC)    Moderate right renal artery stenosis 08/2008  . Paroxysmal atrial fibrillation (HCC)   . Sick sinus syndrome (HCC)    Medtronic dual-chamber his bundle pacemaker - Dr. Rayann Heman  . Type 2 diabetes mellitus (HCC)     ROS:   All systems reviewed and negative except as noted in the HPI.   Past Surgical History:  Procedure Laterality Date  . BIOPSY  08/03/2017   Procedure: BIOPSY;  Surgeon: [name], MD;  [facility];  Service: Endoscopy;;  gastric   . CARDIAC CATHETERIZATION  2014  . CATARACT EXTRACTION W/ INTRAOCULAR LENS  IMPLANT, BILATERAL Bilateral 2008  . COLONOSCOPY N/A 08/03/2017    Procedure: COLONOSCOPY;  Surgeon: [name], MD;  [facility];  Service: Endoscopy;  Laterality: N/A;  . ENDARTERECTOMY Right 12/08/2012   Procedure: ENDARTERECTOMY CAROTID;  Surgeon: [name], MD;  Location: [facility];  Service: Vascular;  Laterality: Right;  . EP IMPLANTABLE DEVICE N/A 03/02/2016   Procedure: Pacemaker Implant;  Surgeon: [name], MD;  Location: [facility];  Service: Cardiovascular;  Laterality: N/A;  . ESOPHAGOGASTRODUODENOSCOPY N/A 08/03/2017   Procedure: ESOPHAGOGASTRODUODENOSCOPY (EGD);  Surgeon: [name], MD;  [facility];  Service: Endoscopy;  Laterality: N/A;  . ESOPHAGOGASTRODUODENOSCOPY (EGD) WITH PROPOFOL N/A 05/11/2019   Procedure: ESOPHAGOGASTRODUODENOSCOPY (EGD) WITH PROPOFOL;  Surgeon: [name], MD;  [facility];  Service: Endoscopy;  Laterality: N/A;  145pm  . INSERT / REPLACE / REMOVE PACEMAKER  03/02/2016  . LEFT HEART CATH AND CORONARY ANGIOGRAPHY N/A 06/20/2018   Procedure: LEFT HEART CATH AND CORONARY ANGIOGRAPHY;  Surgeon: [name], MD;  Location: [facility];  Service: Cardiovascular;  Laterality: N/A;  . PARTIAL COLECTOMY N/A 10/19/2017   Procedure: PARTIAL COLECTOMY;  Surgeon: [name], MD;  Location: [facility];  Service: General;  Laterality: N/A;  . POLYPECTOMY  08/03/2017   Procedure: POLYPECTOMY;  Surgeon: [name], MD;  [facility];  Service: Endoscopy;;  colon     Family History  Problem Relation Age of Onset  . Cancer Mother        Stomach  . Deep vein thrombosis Mother   . Diabetes Mother   . Hypertension Mother   . Heart disease Mother   . Hypertension Father   . Diabetes Sister   . Hyperlipidemia Sister   . Hypertension Sister   . Heart disease Sister   . Diabetes Brother   . Hyperlipidemia Brother   . Hypertension Brother      Social History   Socioeconomic History  . Marital status: Married    Spouse name: Not on file  .  Number of children: Not on file  . Years of education: Not on file  . Highest education level: Not on file  Occupational History  . Not on file  Tobacco Use  . Smoking status: Never Smoker  . Smokeless tobacco: Never Used  Vaping Use  . Vaping Use: Never used  Substance and Sexual Activity  . Alcohol use: No    Alcohol/week: 0.0 standard drinks  . Drug use: No  . Sexual activity: Not Currently    Birth control/protection: None  Other Topics Concern  . Not on file  Social History Narrative   Lives in [place] with spouse.  4 grown children.  Retired but continues to clean houses   Social Determinants of Radio broadcast assistant Strain:   . Difficulty of Paying Living Expenses: Not on file  Food Insecurity:   . Worried About Charity fundraiser in the Last Year: Not on file  . Ran Out of Food in the Last Year: Not on file  Transportation Needs:   . Lack of Transportation (Medical): Not on file  . Lack of Transportation (Non-Medical): Not on file  Physical Activity:   . Days of Exercise per Week: Not on file  . Minutes of Exercise per Session: Not on file  Stress:   . Feeling of Stress : Not on file  Social Connections:   . Frequency of Communication with Friends and Family: Not on file  . Frequency of Social Gatherings with Friends and Family: Not on file  . Attends Religious Services: Not on file  . Active Member of Clubs or Organizations: Not on file  . Attends Archivist Meetings: Not on file  . Marital Status: Not on file  Intimate Partner Violence:   . Fear of Current or Ex-Partner: Not on file  . Emotionally Abused: Not on file  . Physically Abused: Not on file  . Sexually Abused: Not on file     BP 128/66   Pulse 84   Ht 5\' 5"  (1.651 m)   Wt 220 lb 12.8 oz (100.2 kg)   SpO2 95%   BMI 36.74 kg/m   Physical Exam:  Well appearing NAD HEENT: Unremarkable Neck:  No JVD, no thyromegally Lymphatics:  No adenopathy Back:  No CVA  tenderness Lungs:  Clear with no wheezes HEART:  Regular rate rhythm, no murmurs, no rubs, no clicks Abd:  soft, positive bowel sounds, no organomegally, no rebound, no guarding, healed abdominal wound Ext:  2 plus pulses, no edema, no cyanosis, no clubbing Skin:  No rashes no nodules Neuro:  CN II through XII intact, motor grossly intact  EKG - atrial fib with ventricular pacing  DEVICE  Normal device function.  See PaceArt for details.   Assess/Plan: 1. PM lead dysfunction - her threshold remains elevated. I have recommended PM system extraction and discussed the indications/risks/benefits/goals/expectations and she wishes to proceed. 2. Atrial fib - we will hold her anti-coagulation for a couple of days prior to her procedure.  3. CHB - she has an escape today of 35/min. We will follow.  Miranda Overlie Gertie Broerman,MD

## 2020-05-02 DIAGNOSIS — I509 Heart failure, unspecified: Secondary | ICD-10-CM | POA: Diagnosis not present

## 2020-05-02 DIAGNOSIS — J449 Chronic obstructive pulmonary disease, unspecified: Secondary | ICD-10-CM | POA: Diagnosis not present

## 2020-05-02 DIAGNOSIS — E119 Type 2 diabetes mellitus without complications: Secondary | ICD-10-CM | POA: Diagnosis not present

## 2020-05-03 DIAGNOSIS — E1165 Type 2 diabetes mellitus with hyperglycemia: Secondary | ICD-10-CM | POA: Diagnosis not present

## 2020-05-07 ENCOUNTER — Telehealth: Payer: Self-pay | Admitting: Internal Medicine

## 2020-05-07 NOTE — Telephone Encounter (Signed)
Per answering service the pt called stating [name] will only cover one pacemaker, so she is requesting a callback to determine which pacemaker they will pay for. Miranda Sexton is also wanting to check additional conditions as well due to not understanding everything. Please advise.

## 2020-05-07 NOTE — Telephone Encounter (Signed)
Returned call to Pt.  Spoke with daughter.  Advised that insurance was going to pay for procedure.    NO upgrade planned.  Just gen change and replacing broken lead.  No further questions.

## 2020-05-08 ENCOUNTER — Other Ambulatory Visit (HOSPITAL_COMMUNITY): Payer: Medicare HMO

## 2020-05-09 ENCOUNTER — Encounter (HOSPITAL_COMMUNITY): Payer: Self-pay | Admitting: Certified Registered Nurse Anesthetist

## 2020-05-09 ENCOUNTER — Other Ambulatory Visit: Payer: Self-pay

## 2020-05-09 ENCOUNTER — Encounter (HOSPITAL_COMMUNITY): Payer: Self-pay | Admitting: Internal Medicine

## 2020-05-09 MED ORDER — VANCOMYCIN HCL 1500 MG/300ML IV SOLN
1500.0000 mg | INTRAVENOUS | Status: DC
  Filled 2020-05-09 (×2): qty 300

## 2020-05-09 NOTE — Progress Notes (Signed)
Spoke with pt for pre-op call. Pt has hx of A-fib, sick sinus syndrome and non-obstructive CAD. Pt's cardiologists are Dr. Domenic Polite and Dr. Rayann Heman. Pt denies any recent chest pain or shortness of breath. Pt is on Eliquis, instructed to hold 2 days prior to surgery. Her last dose will be this evening's dose.  Pt is a type 2 diabetic. States her blood sugar is usually in the 90's - 100's. She states it was 300 yesterday but she had eaten 2 pieces of pizza the night before. She states it was 98 today. Pt states her most recent A1C was 7.3 in October. Pt instructed to take 70% of her Novolog 70/30 Insulin Sunday PM, she will take 21 units. Instructed her not to take her Insulin or her Glipizide the morning of surgery. She voiced understanding. Instructed pt to check her blood sugar when she gets up Monday AM and every 2 hours until she comes to the hospital. If blood sugar is 70 or below, treat with 1/2 cup of clear juice (apple or cranberry) and recheck blood sugar 15 minutes after drinking juice. If blood sugar continues to be 70 or below, call the Short Stay department and ask to speak to a nurse. She voiced understanding.  Pt had an appt for Covid testing on Saturday but she states she does not have transportation to it. We will bring her in early day of surgery to do the testing. Pt voiced appreciation.   I emailed Gae Dry, rep for [employer], notifying him of pt's surgery on Monday.

## 2020-05-10 ENCOUNTER — Other Ambulatory Visit (HOSPITAL_COMMUNITY): Payer: Medicare HMO

## 2020-05-12 ENCOUNTER — Ambulatory Visit (HOSPITAL_COMMUNITY)
Admission: RE | Admit: 2020-05-12 | Discharge: 2020-05-12 | Disposition: A | Discharge status: Home / Self Care | Payer: Medicare HMO | Attending: Internal Medicine | Admitting: Internal Medicine

## 2020-05-12 ENCOUNTER — Encounter (HOSPITAL_COMMUNITY)
Admission: RE | Disposition: A | Discharge status: Home / Self Care | Payer: Medicare HMO | Source: Home / Self Care | Attending: Internal Medicine

## 2020-05-12 ENCOUNTER — Other Ambulatory Visit: Payer: Self-pay

## 2020-05-12 ENCOUNTER — Encounter (HOSPITAL_COMMUNITY): Payer: Self-pay | Admitting: Internal Medicine

## 2020-05-12 DIAGNOSIS — U071 COVID-19: Secondary | ICD-10-CM | POA: Diagnosis not present

## 2020-05-12 DIAGNOSIS — T829XXA Unspecified complication of cardiac and vascular prosthetic device, implant and graft, initial encounter: Secondary | ICD-10-CM | POA: Insufficient documentation

## 2020-05-12 DIAGNOSIS — I4891 Unspecified atrial fibrillation: Secondary | ICD-10-CM | POA: Insufficient documentation

## 2020-05-12 DIAGNOSIS — Z7901 Long term (current) use of anticoagulants: Secondary | ICD-10-CM | POA: Insufficient documentation

## 2020-05-12 DIAGNOSIS — X58XXXA Exposure to other specified factors, initial encounter: Secondary | ICD-10-CM | POA: Diagnosis not present

## 2020-05-12 DIAGNOSIS — Z539 Procedure and treatment not carried out, unspecified reason: Secondary | ICD-10-CM | POA: Diagnosis not present

## 2020-05-12 HISTORY — DX: Pneumonia, unspecified organism: J18.9

## 2020-05-12 LAB — PREPARE RBC (CROSSMATCH)

## 2020-05-12 LAB — GLUCOSE, CAPILLARY: Glucose-Capillary: 104 mg/dL — ABNORMAL HIGH (ref 70–99)

## 2020-05-12 LAB — SARS CORONAVIRUS 2 BY RT PCR (HOSPITAL ORDER, PERFORMED IN ~~LOC~~ HOSPITAL LAB): SARS Coronavirus 2: POSITIVE — AB

## 2020-05-12 SURGERY — REMOVAL, ELECTRODE LEAD, CARDIAC PACEMAKER, WITHOUT REPLACEMENT
Anesthesia: General | Site: Chest

## 2020-05-12 MED ORDER — ORAL CARE MOUTH RINSE: 15.0000 mL | Freq: Once | OROMUCOSAL | Status: AC

## 2020-05-12 MED ORDER — SODIUM CHLORIDE 0.9 % IV SOLN: INTRAVENOUS | Status: DC

## 2020-05-12 MED ORDER — SODIUM CHLORIDE 0.9 % IV SOLN
80.0000 mg | INTRAVENOUS | Status: DC
  Filled 2020-05-12: qty 2

## 2020-05-12 MED ORDER — CHLORHEXIDINE GLUCONATE 0.12 % MT SOLN
15.0000 mL | Freq: Once | OROMUCOSAL | Status: AC
  Administered 2020-05-12: 15 mL via OROMUCOSAL
  Filled 2020-05-12: qty 15

## 2020-05-12 MED ORDER — SODIUM CHLORIDE 0.9% IV SOLUTION: Freq: Once | INTRAVENOUS | Status: DC

## 2020-05-12 MED ORDER — CHLORHEXIDINE GLUCONATE 4 % EX LIQD: 4.0000 "application " | Freq: Once | CUTANEOUS | Status: DC

## 2020-05-12 NOTE — Progress Notes (Signed)
EP Attending  Patient has been found to be Covid positive. She missed her Covid screening test 2 days ago as she was unable to get a ride. She will be discharged home and rescheduled in a couple of weeks.  Carleene Overlie Lauria Depoy,MD

## 2020-05-12 NOTE — Progress Notes (Addendum)
Patient tested positive for Covid 19.  Dr. Ola Spurr aware.  Paged Dr. Lovena Le- awaiting a return call.  Patient placed on contact.    Dr. Lovena Le aware.  States he will come and speak with patient

## 2020-05-12 NOTE — Interval H&P Note (Signed)
History and Physical Interval Note:  05/12/2020 12:58 PM  Miranda Sexton  has presented today for surgery, with the diagnosis of [place].  The various methods of treatment have been discussed with the patient and family. After consideration of risks, benefits and other options for treatment, the patient has consented to  Procedure(s): MEDTR[employer] PACEMAKER RV LEAD REMOVAL AND [place], (N/A) as a surgical intervention.  The patient's history has been reviewed, patient examined, no change in status, stable for surgery.  I have reviewed the patient's chart and labs.  Questions were answered to the patient's satisfaction.     Cristopher Peru

## 2020-05-13 ENCOUNTER — Telehealth: Payer: Self-pay

## 2020-05-13 ENCOUNTER — Telehealth: Payer: Self-pay | Admitting: Nurse Practitioner

## 2020-05-13 DIAGNOSIS — T82110A Breakdown (mechanical) of cardiac electrode, initial encounter: Secondary | ICD-10-CM

## 2020-05-13 DIAGNOSIS — I495 Sick sinus syndrome: Secondary | ICD-10-CM

## 2020-05-13 LAB — TYPE AND SCREEN
ABO/RH(D): O POS
Antibody Screen: NEGATIVE

## 2020-05-13 NOTE — Telephone Encounter (Signed)
Called to Discuss with patient about Covid symptoms and the use of the monoclonal antibody infusion for those with mild to moderate Covid symptoms and at a high risk of hospitalization.     Pt is qualified for this infusion due to co-morbid conditions and/or a member of an at-risk group.     Patient Active Problem List   Diagnosis Date Noted  . Pacemaker lead failure 04/25/2020  . Abscess of abdominal wall 03/21/2020  . Heme + stool 03/28/2019  . Unstable angina ([place]) 06/17/2018  . S/P partial colectomy 10/19/2017  . Dysplastic colon polyp   . Absolute anemia 06/23/2017  . Pacemaker   . Sick sinus syndrome ([place]) 03/02/2016  . Secondary pulmonary hypertension 12/20/2013  . Dysphagia 03/14/2013  . Occlusion and stenosis of carotid artery without mention of cerebral infarction 12/05/2012  . Exertional dyspnea 11/09/2012  . Essential hypertension, benign 10/26/2012  . Chronic diastolic heart failure ([place]) 10/26/2012  . Mixed hyperlipidemia 09/10/2008  . CORONARY ATHEROSCLEROSIS NATIVE CORONARY ARTERY 09/10/2008    Patient declines infusion at this time. Symptoms tier reviewed as well as criteria for ending isolation. Preventative practices reviewed. Patient verbalized understanding.    Patient advised to call back if he/she decides that he/she does want to get infusion. Callback number to the infusion center given. Patient advised to go to Urgent care or ED with severe symptoms.

## 2020-05-22 ENCOUNTER — Ambulatory Visit: Payer: Medicare HMO

## 2020-05-22 NOTE — Telephone Encounter (Signed)
Outreach made to Pt.  Pt's lead extraction was cancelled d/t Pt testing positive for covid on May 12, 2020.  Per Pt she never had any symptoms of covid.  States she "stays home".  Advised Pt that would try to get her procedure rescheduled for June 05, 2020.  She asks that I contact her daughter.

## 2020-05-27 NOTE — Telephone Encounter (Signed)
Outreach made to Pt's daughter.  Pt rescheduled for lead extraction/replacement and gen change for June 05, 2020 at 3:00 pm  Will get repeat lab work 06/04/20-will meet with family to discuss instructions.  Advised Pt's last dose of Eliquis will be June 02, 2020 her PM dose  NO COVID TEST NEEDED.  POSITIVE TEST 05/12/2020.

## 2020-06-03 ENCOUNTER — Telehealth: Payer: Self-pay | Admitting: Internal Medicine

## 2020-06-03 DIAGNOSIS — E119 Type 2 diabetes mellitus without complications: Secondary | ICD-10-CM | POA: Diagnosis not present

## 2020-06-03 DIAGNOSIS — I509 Heart failure, unspecified: Secondary | ICD-10-CM | POA: Diagnosis not present

## 2020-06-03 DIAGNOSIS — E1165 Type 2 diabetes mellitus with hyperglycemia: Secondary | ICD-10-CM | POA: Diagnosis not present

## 2020-06-03 DIAGNOSIS — J449 Chronic obstructive pulmonary disease, unspecified: Secondary | ICD-10-CM | POA: Diagnosis not present

## 2020-06-03 NOTE — Telephone Encounter (Signed)
New message    Pt daughter called stating that pt can not do procedure on 12.02.21 with Dr. Lovena Le. Her husband passed away yesterday

## 2020-06-04 ENCOUNTER — Other Ambulatory Visit: Payer: Medicare HMO

## 2020-06-05 ENCOUNTER — Inpatient Hospital Stay (HOSPITAL_COMMUNITY): Admission: RE | Admit: 2020-06-05 | Payer: Medicare HMO | Source: Home / Self Care | Admitting: Internal Medicine

## 2020-06-05 ENCOUNTER — Encounter (HOSPITAL_COMMUNITY): Admission: RE | Payer: Self-pay | Source: Home / Self Care

## 2020-06-05 SURGERY — REMOVAL, ELECTRODE LEAD, CARDIAC PACEMAKER, WITHOUT REPLACEMENT
Anesthesia: General | Site: Chest

## 2020-06-10 ENCOUNTER — Other Ambulatory Visit: Payer: Self-pay | Admitting: *Deleted

## 2020-06-10 NOTE — Telephone Encounter (Signed)
Outreach made to Pt's daughter.  Requested call back to schedule procedure on December 13 or 16.

## 2020-06-10 NOTE — Telephone Encounter (Signed)
Call placed to Pt's daughter.  Gen change and lead extraction/replacement rescheduled for June 19, 2020 at 3:00 pm  Will get lab work on June 17, 2020.   NO COVID TEST NEEDED.  PT TESTED POSITIVE ON May 12, 2020.

## 2020-06-10 NOTE — Telephone Encounter (Signed)
Work up complete. 

## 2020-06-17 ENCOUNTER — Telehealth: Payer: Self-pay

## 2020-06-17 ENCOUNTER — Other Ambulatory Visit: Payer: Self-pay

## 2020-06-17 ENCOUNTER — Ambulatory Visit: Payer: Medicare HMO

## 2020-06-17 ENCOUNTER — Other Ambulatory Visit: Payer: Medicare HMO

## 2020-06-17 DIAGNOSIS — I495 Sick sinus syndrome: Secondary | ICD-10-CM | POA: Diagnosis not present

## 2020-06-17 DIAGNOSIS — T82110A Breakdown (mechanical) of cardiac electrode, initial encounter: Secondary | ICD-10-CM

## 2020-06-17 NOTE — Telephone Encounter (Signed)
error 

## 2020-06-18 ENCOUNTER — Encounter (HOSPITAL_COMMUNITY): Payer: Self-pay | Admitting: Internal Medicine

## 2020-06-18 ENCOUNTER — Encounter: Payer: Self-pay | Admitting: Internal Medicine

## 2020-06-18 LAB — CBC WITH DIFFERENTIAL/PLATELET
Basophils Absolute: 0.1 10*3/uL (ref 0.0–0.2)
Basos: 1 %
EOS (ABSOLUTE): 0.4 10*3/uL (ref 0.0–0.4)
Eos: 7 %
Hematocrit: 38.7 % (ref 34.0–46.6)
Hemoglobin: 12.5 g/dL (ref 11.1–15.9)
Immature Grans (Abs): 0 10*3/uL (ref 0.0–0.1)
Immature Granulocytes: 0 %
Lymphocytes Absolute: 1.6 10*3/uL (ref 0.7–3.1)
Lymphs: 32 %
MCH: 28.7 pg (ref 26.6–33.0)
MCHC: 32.3 g/dL (ref 31.5–35.7)
MCV: 89 fL (ref 79–97)
Monocytes Absolute: 0.7 10*3/uL (ref 0.1–0.9)
Monocytes: 13 %
Neutrophils Absolute: 2.3 10*3/uL (ref 1.4–7.0)
Neutrophils: 47 %
Platelets: 279 10*3/uL (ref 150–450)
RBC: 4.35 x10E6/uL (ref 3.77–5.28)
RDW: 14.2 % (ref 11.7–15.4)
WBC: 5 10*3/uL (ref 3.4–10.8)

## 2020-06-18 LAB — BASIC METABOLIC PANEL
BUN/Creatinine Ratio: 14 (ref 12–28)
BUN: 15 mg/dL (ref 8–27)
CO2: 29 mmol/L (ref 20–29)
Calcium: 9.3 mg/dL (ref 8.7–10.3)
Chloride: 101 mmol/L (ref 96–106)
Creatinine, Ser: 1.04 mg/dL — ABNORMAL HIGH (ref 0.57–1.00)
GFR calc Af Amer: 59 mL/min/{1.73_m2} — ABNORMAL LOW (ref >59)
GFR calc non Af Amer: 51 mL/min/{1.73_m2} — ABNORMAL LOW (ref >59)
Glucose: 113 mg/dL — ABNORMAL HIGH (ref 65–99)
Potassium: 4.5 mmol/L (ref 3.5–5.2)
Sodium: 140 mmol/L (ref 134–144)

## 2020-06-18 NOTE — Progress Notes (Signed)
PERIOPERATIVE PRESCRIPTION FOR IMPLANTED CARDIAC DEVICE PROGRAMMING   Patient Information: Name: [place], Nellums  DOB: 12/18/2019  MRN: [id number]   Berneta Sages, RN  P [facility] Device Cc: Clayborn Bigness, RN Planned Procedure: PETKKOECX PACEMAKER RV LEAD REMOVAL AND [place] CHANGE OUT  Surgeon: Dr Cristopher Peru  Date of Procedure: 06/19/20  Cautery will be used.  Position during surgery: supine   Please send documentation back to:  [facility] (Fax # [phone])       Device Information:   Clinic EP Physician:   [name], MD Device Type:  Pacemaker Manufacturer and Phone #:  Medtronic: [phone] Pacemaker Dependent?:  Yes Date of Last Device Check:  04/25/20        Normal Device Function?:  No  RV lead issue   Electrophysiologist's Recommendations:   Medtronic industry rep to be present for procedure.  Per Device Clinic Standing Orders, Drake Leach  06/18/2020 3:29 PM

## 2020-06-18 NOTE — Progress Notes (Signed)
Anesthesia Chart Review:  Case: [id number] Date/Time: 06/19/20 1445   Procedure: MEDTRONIC PACEMAKER RV LEAD REMOVAL AND PLACMENT GENREATOR CHANGE OUT, (N/A Chest)   Anesthesia type: General   Pre-op diagnosis: LEAD FAILURE,ERI   Location: [facility] OR ROOM 16 / [place] OR   Surgeons: Evans Lance, MD    Back up CT surgeon: Ivin Poot, MD  DISCUSSION: Patient is a 79 year old female scheduled for the above procedure.  She has a Medtronic pacemaker which is approaching ERI and also needs RV lead replaced (increasing thresholds). Procedure was initially scheduled for 05/12/20, but rescheduled due to positive COVID-19 screening pre-procedure test.   History includes never smoker, CAD (non-obstructive 06/2018), SSS (s/p Medtronic Advisa DR MRI SureScan model A2DR01 PPM 03/03/16), PAF, chronic diastolic CHF, HLD, HTN, PAD ("Moderate right renal artery stenosis 08/2008"), carotid artery stenosis (s/p right carotid endarterectomy 12/08/12), CKD (stage II), GERD, COPD (home O2), DM2, right hemicolectomy (10/19/17 for hight grade dysplastic polyp), obesity.   Pre-procedure instructions as outlined by cardiology include: Do NOT take your metoprolol or Eliquis for 2 days prior to your procedure.  Your last dose will be June 16, 2020 your PM doses. HOLD your INSULIN the night before your procedure   Anesthesia team to evaluate on the day of procedure.   VS: Ht 5\' 5"  (1.651 m)   Wt 100.2 kg   BMI 36.76 kg/m  BP Readings from Last 3 Encounters:  05/12/20 135/60  04/25/20 128/66  03/21/20 (!) 174/71    PROVIDERS: Glenda Chroman, MD is PCP  [name], MD is EP cardiologist Rozann Lesches, MD is cardiologist   LABS: Lab results from 06/17/20 include: Lab Results  Component Value Date   WBC 5.0 06/17/2020   HGB 12.5 06/17/2020   HCT 38.7 06/17/2020   PLT 279 06/17/2020   GLUCOSE 113 (H) 06/17/2020   ALT 15 07/07/2019   AST 18 07/07/2019   NA 140 06/17/2020   K 4.5 06/17/2020   CL 101  06/17/2020   CREATININE 1.04 (H) 06/17/2020   BUN 15 06/17/2020   CO2 29 06/17/2020    PFTs 07/05/19: "PFTs reveals severe obstructive disease." Dr. Rayann Heman referred her to pulmonology Luan Pulling, Percell Miller, MD; now recently retired)   IMAGES: CTA Chest/abd/pelvis 07/07/19: IMPRESSION: 1. Thoracic and abdominal aortic atherosclerosis without evidence of aneurysm, dissection, or other acute aortic pathology. Aortic Atherosclerosis (ICD10-I70.0). 2. Aortic valve calcifications. 3. Coronary artery disease. 4. Hepatic steatosis. 5. Postoperative findings of partial right colectomy. 6. Redemonstrated low midline ventral hernia containing nonobstructed loops of small bowel.   EKG: 04/25/2020 ([facility]): A. fib with ventricular pacing. - Diffuse negative T waves, more prominent when compared to 09/07/19 tracing.   CV: Echo 05/16/19: IMPRESSIONS  1. Left ventricular ejection fraction, by visual estimation, is 65 to  70%. The left ventricle has hyperdynamic function. There is no left  ventricular hypertrophy.  2. Elevated left ventricular end-diastolic pressure.  3. Left ventricular diastolic parameters are indeterminate.  4. The left ventricle has no regional wall motion abnormalities.  5. Global right ventricle has normal systolic function.The right  ventricular size is normal. No increase in right ventricular wall  thickness.  6. Left atrial size was normal.  7. Right atrial size was normal.  8. Mild mitral annular calcification.  9. The mitral valve is degenerative. Moderate mitral valve regurgitation.  10. The tricuspid valve is grossly normal. Tricuspid valve regurgitation  moderate.  11. The aortic valve is tricuspid. Aortic valve regurgitation is not  visualized. Mild  aortic valve stenosis.  12. There is Moderate calcification of the aortic valve.  13. There is Moderate thickening of the aortic valve.  14. The pulmonic valve was grossly normal. Pulmonic valve  regurgitation is  trivial.  15. Moderately elevated pulmonary artery systolic pressure.  16. A pacer wire is visualized.  17. The inferior vena cava is normal in size with greater than 50%  respiratory variability, suggesting right atrial pressure of 3 mmHg.  - In comparison to the previous echocardiogram(s): Echocardiogram done  06/18/18 showed an EF of 65%.    LHC 06/20/18:  Prox LAD lesion is 40% stenosed. Otherwise minimal CAD with a left dominant system.  The left ventricular systolic function is normal.  LV end diastolic pressure is moderately elevated.   SUMMARY:  Angiographically normal coronary arteries with only mild to moderate proximal LAD disease.  Left dominant system.  Normal LVEF with moderately elevated EDP.  RECOMMENDATIONS:  Evaluate for other non-anginal cause for chest pain (if cardiac, could consider coronary spasm versus hypertensive urgency)   Carotid US 04/16/16: Impression: Patent right carotid endarterectomy site with no evidence of hyperplasia or restenosis. Left internal carotid artery velocities suggest a 40 to 59% stenosis.   RHC/LHC 12/17/12: Final Conclusions:  RHC showed that left and right heart filling pressures are not significantly elevated.  I do not note pulmonary hypertension (echo suggested pulmonary HTN).  No significant CAD.  Normal LV systolic function.  I cannot explain the patient's dyspnea from the findings of this procedure.      Past Medical History:  Diagnosis Date  . AICD (automatic cardioverter/defibrillator) present 02/2016   Medtronic Pacemaker Advisa -Dr Rayann Heman  . Anxiety   . Asthma   . Carotid artery occlusion   . Chronic bronchitis ([place])   . Chronic diastolic heart failure ([place])   . CKD (chronic kidney disease) stage 2, GFR 60-89 ml/min   . COPD (chronic obstructive pulmonary disease) ([place])   . Coronary atherosclerosis of native coronary artery    Nonobstructive 08/2008  . Daily headache   . DJD  (degenerative joint disease)   . Essential hypertension   . GERD (gastroesophageal reflux disease)   . Hyperlipidemia   . Obesity   . On home oxygen therapy   . PAD (peripheral artery disease) ([facility])    Moderate right renal artery stenosis 08/2008  . Paroxysmal atrial fibrillation ([facility])   . Pneumonia   . Sick sinus syndrome ([facility])    Medtronic dual-chamber his bundle pacemaker - Dr. Rayann Heman  . Type 2 diabetes mellitus ([place])     Past Surgical History:  Procedure Laterality Date  . BIOPSY  08/03/2017   Procedure: BIOPSY;  Surgeon: [name], MD;  [facility];  Service: Endoscopy;;  gastric   . CARDIAC CATHETERIZATION  06/20/2018  . CATARACT EXTRACTION W/ INTRAOCULAR LENS  IMPLANT, BILATERAL Bilateral 2008  . COLONOSCOPY N/A 08/03/2017   Procedure: COLONOSCOPY;  Surgeon: [name], MD;  [facility];  Service: Endoscopy;  Laterality: N/A;  . ENDARTERECTOMY Right 12/08/2012   Procedure: ENDARTERECTOMY CAROTID;  Surgeon: [name], MD;  Location: [facility];  Service: Vascular;  Laterality: Right;  . EP IMPLANTABLE DEVICE N/A 03/02/2016   Procedure: Pacemaker Implant;  Surgeon: [name], MD;  Location: [place] [facility];  Service: Cardiovascular;  Laterality: N/A;  . ESOPHAGOGASTRODUODENOSCOPY N/A 08/03/2017   Procedure: ESOPHAGOGASTRODUODENOSCOPY (EGD);  Surgeon: [name], MD;  [facility];  Service: Endoscopy;  Laterality: N/A;  .  ESOPHAGOGASTRODUODENOSCOPY (EGD) WITH PROPOFOL N/A 05/11/2019   Procedure: ESOPHAGOGASTRODUODENOSCOPY (EGD) WITH PROPOFOL;  Surgeon: [name], MD;  [facility];  Service: Endoscopy;  Laterality: N/A;  145pm  . INSERT / REPLACE / REMOVE PACEMAKER  03/02/2016  . LEFT HEART CATH AND CORONARY ANGIOGRAPHY N/A 06/20/2018   Procedure: LEFT HEART CATH AND CORONARY ANGIOGRAPHY;  Surgeon: [name], MD;  Location: [place] [facility];  Service: Cardiovascular;  Laterality: N/A;  . PARTIAL  COLECTOMY N/A 10/19/2017   Procedure: PARTIAL COLECTOMY;  Surgeon: [name], MD;  Location: [facility];  Service: General;  Laterality: N/A;  . POLYPECTOMY  08/03/2017   Procedure: POLYPECTOMY;  Surgeon: [name], MD;  [facility];  Service: Endoscopy;;  colon    MEDICATIONS: No current facility-administered medications for this encounter.   Marland Kitchen acetaminophen (TYLENOL) 325 MG tablet  . albuterol (VENTOLIN HFA) 108 (90 Base) MCG/ACT inhaler  . apixaban (ELIQUIS) 5 MG TABS tablet  . ascorbic acid (VITAMIN C) 500 MG tablet  . atorvastatin (LIPITOR) 40 MG tablet  . benazepril (LOTENSIN) 40 MG tablet  . flecainide (TAMBOCOR) 50 MG tablet  . furosemide (LASIX) 40 MG tablet  . glipiZIDE (GLUCOTROL XL) 10 MG 24 hr tablet  . insulin aspart protamine- aspart (NOVOLOG 70/30) (70-30) 100 UNIT/ML injection  . metoprolol succinate (TOPROL-XL) 25 MG 24 hr tablet  . moxifloxacin (VIGAMOX) 0.5 % ophthalmic solution  . nitroGLYCERIN (NITROSTAT) 0.4 MG SL tablet  . potassium chloride SA (K-DUR) 20 MEQ tablet  . TRULICITY 4.81 EH/6.3JS SOPN  . venlafaxine XR (EFFEXOR-XR) 150 MG 24 hr capsule  . vitamin B-12 (CYANOCOBALAMIN) 50 MCG tablet  . ONETOUCH VERIO test strip     Myra Gianotti, PA-C Surgical Short Stay/Anesthesiology [facility] Phone [phone] [facility] Phone [phone] 06/18/2020 2:50 PM

## 2020-06-18 NOTE — Anesthesia Preprocedure Evaluation (Addendum)
Anesthesia Evaluation  Patient identified by MRN, date of birth, ID band Patient awake    Reviewed: Allergy & Precautions, NPO status , Patient's Chart, lab work & pertinent test results  History of Anesthesia Complications Negative for: history of anesthetic complications  Airway Mallampati: II  TM Distance: >3 FB Neck ROM: Full    Dental  (+) Missing, Dental Advisory Given   Pulmonary asthma , COPD,  oxygen dependent,    Pulmonary exam normal        Cardiovascular hypertension, Normal cardiovascular exam+ pacemaker   Echo 05/16/19: IMPRESSIONS  1. Left ventricular ejection fraction, by visual estimation, is 65 to  70%. The left ventricle has hyperdynamic function. There is no left  ventricular hypertrophy.  2. Elevated left ventricular end-diastolic pressure.  3. Left ventricular diastolic parameters are indeterminate.  4. The left ventricle has no regional wall motion abnormalities.  5. Global right ventricle has normal systolic function.The right  ventricular size is normal. No increase in right ventricular wall  thickness.  6. Left atrial size was normal.  7. Right atrial size was normal.  8. Mild mitral annular calcification.  9. The mitral valve is degenerative. Moderate mitral valve regurgitation.  10. The tricuspid valve is grossly normal. Tricuspid valve regurgitation  moderate.  11. The aortic valve is tricuspid. Aortic valve regurgitation is not  visualized. Mild aortic valve stenosis.  12. There is Moderate calcification of the aortic valve.  13. There is Moderate thickening of the aortic valve.  14. The pulmonic valve was grossly normal. Pulmonic valve regurgitation is  trivial.  15. Moderately elevated pulmonary artery systolic pressure.  16. A pacer wire is visualized.  17. The inferior vena cava is normal in size with greater than 50%  respiratory variability, suggesting right atrial pressure of  3 mmHg.  - In comparison to the previous echocardiogram(s): Echocardiogram done  06/18/18 showed an EF of 65%.    LHC 06/20/18:  Prox LAD lesion is 40% stenosed. Otherwise minimal CAD with a left dominant system.  The left ventricular systolic function is normal.  LV end diastolic pressure is moderately elevated.  SUMMARY:  Angiographically normal coronary arteries with only mild to moderate proximal LAD disease.  Left dominant system.  Normal LVEF with moderately elevated EDP.  RECOMMENDATIONS:  Evaluate for other non-anginal cause for chest pain (if cardiac, could consider coronary spasm versus hypertensive urgency)    Neuro/Psych PSYCHIATRIC DISORDERS Anxiety negative neurological ROS     GI/Hepatic Neg liver ROS, GERD  ,  Endo/Other  negative endocrine ROSdiabetes  Renal/GU Renal InsufficiencyRenal disease     Musculoskeletal negative musculoskeletal ROS (+)   Abdominal   Peds  Hematology negative hematology ROS (+)   Anesthesia Other Findings   Reproductive/Obstetrics                           Anesthesia Physical Anesthesia Plan  ASA: III  Anesthesia Plan: General   Post-op Pain Management:    Induction: Intravenous  PONV Risk Score and Plan: 4 or greater and Ondansetron, Dexamethasone and Treatment may vary due to age or medical condition  Airway Management Planned: Oral ETT  Additional Equipment: Arterial line  Intra-op Plan:   Post-operative Plan: Extubation in OR  Informed Consent: I have reviewed the patients History and Physical, chart, labs and discussed the procedure including the risks, benefits and alternatives for the proposed anesthesia with the patient or authorized representative who has indicated his/her understanding and acceptance.  Dental advisory given  Plan Discussed with: Anesthesiologist and CRNA  Anesthesia Plan Comments: (PAT note written 06/18/2020 by Myra Gianotti, PA-C. )       Anesthesia Quick Evaluation

## 2020-06-18 NOTE — Progress Notes (Signed)
PCP - Dr Woody Seller Cardiologist - Dr Cristopher Peru Pacemaker - Dr Rayann Heman  Chest x-ray - CT Chest 07/07/19 EKG - 04/05/20 Stress Test - 10/2012 ECHO - 05/16/19 Cardiac Cath - 06/20/18  ICD Pacemaker - Medtronic A2DR01 Advisa, last device check was on 04/25/20.  Medtronic Reps Ronalee Belts and Leanna Sato notified of surgery date/time.  Perioperative device programming submitted, awaiting results.  Fasting Blood Sugar -  90-120s Checks Blood Sugar 2 times a day   Do not take oral diabetes medicines (glipizide) the morning of surgery.  THE NIGHT BEFORE SURGERY, do not take your bedtime dose of Novolog Insulin.        THE MORNING OF SURGERY, do not take Novolog Insulin unless your CBG is greater than 220mg /dl.  If your CBG is greater than 220 mg/dL, you may take  of your sliding scale (correction) dose.    If your blood sugar is less than 70 mg/dL, you will need to treat for low blood sugar: o Treat a low blood sugar (less than 70 mg/dL) with  cup of clear juice (cranberry or apple), 4 glucose tablets, OR glucose gel. o Recheck blood sugar in 15 minutes after treatment (to make sure it is greater than 70 mg/dL). If your blood sugar is not greater than 70 mg/dL on recheck, call [phone] for further instructions.  Blood Thinner Instructions:  Last dose of Eliquis was on 06/16/20.  Patient stated Dr Lovena Le wanted her to hold metoprolol for 2 days prior - LD 06/16/20.  Anesthesia review: Yes  STOP now taking any Aspirin (unless otherwise instructed by your surgeon), Aleve, Naproxen, Ibuprofen, Motrin, Advil, Goody's, BC's, all herbal medications, fish oil, and all vitamins.   Coronavirus Screening Positive covid test on 05/12/20, no retest needed.  Do you have any of the following symptoms:  Cough yes/no: No Fever (>100.18F)  yes/no: No Runny nose yes/no: No Sore throat yes/no: No Difficulty breathing/shortness of breath  yes/no: No  Have you traveled in the last 14 days and where? yes/no: No  Patient  verbalized understanding of instructions that were given via phone.

## 2020-06-19 ENCOUNTER — Inpatient Hospital Stay (HOSPITAL_COMMUNITY): Payer: Medicare HMO | Admitting: Vascular Surgery

## 2020-06-19 ENCOUNTER — Other Ambulatory Visit: Payer: Self-pay

## 2020-06-19 ENCOUNTER — Inpatient Hospital Stay (HOSPITAL_COMMUNITY): Payer: Medicare HMO

## 2020-06-19 ENCOUNTER — Ambulatory Visit (HOSPITAL_COMMUNITY)
Admission: RE | Admit: 2020-06-19 | Discharge: 2020-06-20 | Disposition: A | Discharge status: Home / Self Care | Payer: Medicare HMO | Attending: Cardiology | Admitting: Cardiology

## 2020-06-19 ENCOUNTER — Encounter (HOSPITAL_COMMUNITY)
Admission: RE | Disposition: A | Discharge status: Home / Self Care | Payer: Self-pay | Source: Home / Self Care | Attending: Cardiology

## 2020-06-19 ENCOUNTER — Encounter (HOSPITAL_COMMUNITY): Payer: Self-pay | Admitting: Internal Medicine

## 2020-06-19 DIAGNOSIS — Z794 Long term (current) use of insulin: Secondary | ICD-10-CM | POA: Insufficient documentation

## 2020-06-19 DIAGNOSIS — Z950 Presence of cardiac pacemaker: Secondary | ICD-10-CM

## 2020-06-19 DIAGNOSIS — T82897A Other specified complication of cardiac prosthetic devices, implants and grafts, initial encounter: Secondary | ICD-10-CM | POA: Diagnosis not present

## 2020-06-19 DIAGNOSIS — Z885 Allergy status to narcotic agent status: Secondary | ICD-10-CM | POA: Diagnosis not present

## 2020-06-19 DIAGNOSIS — T82110A Breakdown (mechanical) of cardiac electrode, initial encounter: Secondary | ICD-10-CM | POA: Diagnosis not present

## 2020-06-19 DIAGNOSIS — Z79899 Other long term (current) drug therapy: Secondary | ICD-10-CM | POA: Insufficient documentation

## 2020-06-19 DIAGNOSIS — I25110 Atherosclerotic heart disease of native coronary artery with unstable angina pectoris: Secondary | ICD-10-CM | POA: Diagnosis not present

## 2020-06-19 DIAGNOSIS — Z7901 Long term (current) use of anticoagulants: Secondary | ICD-10-CM | POA: Insufficient documentation

## 2020-06-19 DIAGNOSIS — Z880 Allergy status to penicillin: Secondary | ICD-10-CM | POA: Diagnosis not present

## 2020-06-19 DIAGNOSIS — I442 Atrioventricular block, complete: Secondary | ICD-10-CM | POA: Diagnosis not present

## 2020-06-19 DIAGNOSIS — I4819 Other persistent atrial fibrillation: Secondary | ICD-10-CM

## 2020-06-19 DIAGNOSIS — I4891 Unspecified atrial fibrillation: Secondary | ICD-10-CM | POA: Diagnosis not present

## 2020-06-19 DIAGNOSIS — E119 Type 2 diabetes mellitus without complications: Secondary | ICD-10-CM | POA: Diagnosis not present

## 2020-06-19 DIAGNOSIS — Z45010 Encounter for checking and testing of cardiac pacemaker pulse generator [battery]: Secondary | ICD-10-CM | POA: Insufficient documentation

## 2020-06-19 DIAGNOSIS — I10 Essential (primary) hypertension: Secondary | ICD-10-CM | POA: Diagnosis not present

## 2020-06-19 HISTORY — PX: PACEMAKER LEAD REMOVAL: SHX5064

## 2020-06-19 HISTORY — PX: OTHER SURGICAL HISTORY: SHX169

## 2020-06-19 LAB — HEMOGLOBIN A1C
Hgb A1c MFr Bld: 7.4 % — ABNORMAL HIGH (ref 4.8–5.6)
Mean Plasma Glucose: 165.68 mg/dL

## 2020-06-19 LAB — GLUCOSE, CAPILLARY
Glucose-Capillary: 139 mg/dL — ABNORMAL HIGH (ref 70–99)
Glucose-Capillary: 127 mg/dL — ABNORMAL HIGH (ref 70–99)
Glucose-Capillary: 188 mg/dL — ABNORMAL HIGH (ref 70–99)

## 2020-06-19 LAB — PREPARE RBC (CROSSMATCH)

## 2020-06-19 SURGERY — REMOVAL, ELECTRODE LEAD, CARDIAC PACEMAKER, WITHOUT REPLACEMENT
Anesthesia: General | Site: Chest

## 2020-06-19 MED ORDER — SODIUM CHLORIDE 0.9% IV SOLUTION: Freq: Once | INTRAVENOUS | Status: DC

## 2020-06-19 MED ORDER — ACETAMINOPHEN 325 MG PO TABS
325.0000 mg | ORAL_TABLET | ORAL | Status: DC | PRN
Start: 2020-06-19 — End: 2020-06-20

## 2020-06-19 MED ORDER — DEXAMETHASONE SODIUM PHOSPHATE 10 MG/ML IJ SOLN
INTRAMUSCULAR | Status: AC
  Filled 2020-06-19: qty 1

## 2020-06-19 MED ORDER — PROPOFOL 10 MG/ML IV BOLUS
INTRAVENOUS | Status: DC | PRN
  Administered 2020-06-19: 130 mg via INTRAVENOUS

## 2020-06-19 MED ORDER — VANCOMYCIN HCL IN DEXTROSE 1-5 GM/200ML-% IV SOLN
1000.0000 mg | Freq: Once | INTRAVENOUS | Status: AC
  Administered 2020-06-19: 17:00:00 1000 mg via INTRAVENOUS

## 2020-06-19 MED ORDER — FENTANYL CITRATE (PF) 250 MCG/5ML IJ SOLN
INTRAMUSCULAR | Status: DC | PRN
  Administered 2020-06-19 (×2): 100 ug via INTRAVENOUS

## 2020-06-19 MED ORDER — VANCOMYCIN HCL IN DEXTROSE 1-5 GM/200ML-% IV SOLN
1000.0000 mg | Freq: Two times a day (BID) | INTRAVENOUS | Status: AC
Start: 2020-06-20 — End: 2020-06-20
  Administered 2020-06-20: 04:00:00 1000 mg via INTRAVENOUS
  Filled 2020-06-19: qty 200

## 2020-06-19 MED ORDER — PROPOFOL 10 MG/ML IV BOLUS
INTRAVENOUS | Status: AC
  Filled 2020-06-19: qty 20

## 2020-06-19 MED ORDER — PHENYLEPHRINE HCL-NACL 10-0.9 MG/250ML-% IV SOLN
INTRAVENOUS | Status: DC | PRN
  Administered 2020-06-19: 50 ug/min via INTRAVENOUS

## 2020-06-19 MED ORDER — PHENYLEPHRINE HCL-NACL 10-0.9 MG/250ML-% IV SOLN
25.0000 ug/min | INTRAVENOUS | Status: DC
  Filled 2020-06-19: qty 250

## 2020-06-19 MED ORDER — VANCOMYCIN HCL IN DEXTROSE 1-5 GM/200ML-% IV SOLN
INTRAVENOUS | Status: AC
  Filled 2020-06-19: qty 200

## 2020-06-19 MED ORDER — ONDANSETRON HCL 4 MG/2ML IJ SOLN
INTRAMUSCULAR | Status: DC | PRN
  Administered 2020-06-19: 4 mg via INTRAVENOUS

## 2020-06-19 MED ORDER — LIDOCAINE 2% (20 MG/ML) 5 ML SYRINGE
INTRAMUSCULAR | Status: AC
  Filled 2020-06-19: qty 5

## 2020-06-19 MED ORDER — INSULIN ASPART 100 UNIT/ML ~~LOC~~ SOLN
0.0000 [IU] | Freq: Three times a day (TID) | SUBCUTANEOUS | Status: DC
  Administered 2020-06-20: 08:00:00 11 [IU] via SUBCUTANEOUS

## 2020-06-19 MED ORDER — FENTANYL CITRATE (PF) 100 MCG/2ML IJ SOLN: 25.0000 ug | INTRAMUSCULAR | Status: DC | PRN

## 2020-06-19 MED ORDER — SODIUM CHLORIDE 0.9 % IV SOLN
INTRAVENOUS | Status: DC | PRN
  Administered 2020-06-19: 17:00:00 500 mL

## 2020-06-19 MED ORDER — BENAZEPRIL HCL 40 MG PO TABS
40.0000 mg | ORAL_TABLET | Freq: Every day | ORAL | Status: DC
  Administered 2020-06-20: 09:00:00 40 mg via ORAL
  Filled 2020-06-19: qty 1

## 2020-06-19 MED ORDER — ORAL CARE MOUTH RINSE: 15.0000 mL | Freq: Once | OROMUCOSAL | Status: AC

## 2020-06-19 MED ORDER — METOPROLOL SUCCINATE ER 25 MG PO TB24
12.5000 mg | ORAL_TABLET | Freq: Every day | ORAL | Status: DC
  Administered 2020-06-20: 09:00:00 12.5 mg via ORAL
  Filled 2020-06-19: qty 1

## 2020-06-19 MED ORDER — SODIUM CHLORIDE 0.9 % IV SOLN
INTRAVENOUS | Status: AC
  Filled 2020-06-19 (×2): qty 2

## 2020-06-19 MED ORDER — ACETAMINOPHEN 500 MG PO TABS
1000.0000 mg | ORAL_TABLET | Freq: Once | ORAL | Status: AC
  Administered 2020-06-19: 13:00:00 1000 mg via ORAL
  Filled 2020-06-19: qty 2

## 2020-06-19 MED ORDER — FLECAINIDE ACETATE 50 MG PO TABS
50.0000 mg | ORAL_TABLET | Freq: Two times a day (BID) | ORAL | Status: DC
  Administered 2020-06-20: 09:00:00 50 mg via ORAL
  Filled 2020-06-19: qty 1

## 2020-06-19 MED ORDER — CELECOXIB 200 MG PO CAPS
200.0000 mg | ORAL_CAPSULE | Freq: Once | ORAL | Status: AC
  Administered 2020-06-19: 13:00:00 200 mg via ORAL
  Filled 2020-06-19: qty 1

## 2020-06-19 MED ORDER — METOPROLOL TARTRATE 5 MG/5ML IV SOLN
INTRAVENOUS | Status: DC | PRN
  Administered 2020-06-19: 2 mg via INTRAVENOUS
  Administered 2020-06-19: 1 mg via INTRAVENOUS
  Administered 2020-06-19: 2 mg via INTRAVENOUS

## 2020-06-19 MED ORDER — PHENYLEPHRINE 40 MCG/ML (10ML) SYRINGE FOR IV PUSH (FOR BLOOD PRESSURE SUPPORT)
PREFILLED_SYRINGE | INTRAVENOUS | Status: DC | PRN
  Administered 2020-06-19: 40 ug via INTRAVENOUS
  Administered 2020-06-19: 80 ug via INTRAVENOUS
  Administered 2020-06-19: 40 ug via INTRAVENOUS
  Administered 2020-06-19: 80 ug via INTRAVENOUS

## 2020-06-19 MED ORDER — SODIUM CHLORIDE 0.9 % IV SOLN: INTRAVENOUS | Status: DC | PRN

## 2020-06-19 MED ORDER — SUGAMMADEX SODIUM 200 MG/2ML IV SOLN
INTRAVENOUS | Status: DC | PRN
  Administered 2020-06-19: 300 mg via INTRAVENOUS

## 2020-06-19 MED ORDER — INSULIN ASPART 100 UNIT/ML ~~LOC~~ SOLN: 0.0000 [IU] | Freq: Every day | SUBCUTANEOUS | Status: DC

## 2020-06-19 MED ORDER — ALBUMIN HUMAN 5 % IV SOLN: 12.5000 g | Freq: Once | INTRAVENOUS | Status: AC

## 2020-06-19 MED ORDER — GLIPIZIDE ER 5 MG PO TB24
10.0000 mg | ORAL_TABLET | Freq: Every day | ORAL | Status: DC
  Administered 2020-06-20: 09:00:00 10 mg via ORAL
  Filled 2020-06-19: qty 2

## 2020-06-19 MED ORDER — CHLORHEXIDINE GLUCONATE 0.12 % MT SOLN
15.0000 mL | Freq: Once | OROMUCOSAL | Status: AC
  Administered 2020-06-19: 13:00:00 15 mL via OROMUCOSAL
  Filled 2020-06-19: qty 15

## 2020-06-19 MED ORDER — ONDANSETRON HCL 4 MG/2ML IJ SOLN: 4.0000 mg | Freq: Four times a day (QID) | INTRAMUSCULAR | Status: DC | PRN

## 2020-06-19 MED ORDER — ALBUMIN HUMAN 5 % IV SOLN
INTRAVENOUS | Status: AC
  Administered 2020-06-19: 20:00:00 12.5 g via INTRAVENOUS
  Filled 2020-06-19: qty 250

## 2020-06-19 MED ORDER — PHENYLEPHRINE HCL-NACL 10-0.9 MG/250ML-% IV SOLN
0.0000 ug/min | INTRAVENOUS | Status: DC
  Filled 2020-06-19: qty 250

## 2020-06-19 MED ORDER — FENTANYL CITRATE (PF) 250 MCG/5ML IJ SOLN
INTRAMUSCULAR | Status: AC
  Filled 2020-06-19: qty 5

## 2020-06-19 MED ORDER — LACTATED RINGERS IV SOLN: INTRAVENOUS | Status: DC

## 2020-06-19 MED ORDER — ROCURONIUM BROMIDE 10 MG/ML (PF) SYRINGE
PREFILLED_SYRINGE | INTRAVENOUS | Status: AC
  Filled 2020-06-19: qty 10

## 2020-06-19 MED ORDER — DEXAMETHASONE SODIUM PHOSPHATE 10 MG/ML IJ SOLN
INTRAMUSCULAR | Status: DC | PRN
  Administered 2020-06-19: 5 mg via INTRAVENOUS

## 2020-06-19 MED ORDER — ATORVASTATIN CALCIUM 40 MG PO TABS
40.0000 mg | ORAL_TABLET | Freq: Every day | ORAL | Status: DC
  Administered 2020-06-20: 09:00:00 40 mg via ORAL
  Filled 2020-06-19: qty 1

## 2020-06-19 MED ORDER — PROMETHAZINE HCL 25 MG/ML IJ SOLN: 6.2500 mg | INTRAMUSCULAR | Status: DC | PRN

## 2020-06-19 MED ORDER — LIDOCAINE 2% (20 MG/ML) 5 ML SYRINGE
INTRAMUSCULAR | Status: DC | PRN
  Administered 2020-06-19: 100 mg via INTRAVENOUS

## 2020-06-19 MED ORDER — VENLAFAXINE HCL ER 150 MG PO CP24
150.0000 mg | ORAL_CAPSULE | Freq: Every day | ORAL | Status: DC
  Administered 2020-06-20: 09:00:00 150 mg via ORAL
  Filled 2020-06-19: qty 1

## 2020-06-19 MED ORDER — IODIXANOL 320 MG/ML IV SOLN
INTRAVENOUS | Status: DC | PRN
  Administered 2020-06-19: 17:00:00 100 mL

## 2020-06-19 MED ORDER — EPHEDRINE SULFATE-NACL 50-0.9 MG/10ML-% IV SOSY
PREFILLED_SYRINGE | INTRAVENOUS | Status: DC | PRN
  Administered 2020-06-19: 5 mg via INTRAVENOUS
  Administered 2020-06-19: 10 mg via INTRAVENOUS

## 2020-06-19 MED ORDER — ALBUMIN HUMAN 5 % IV SOLN: INTRAVENOUS | Status: DC | PRN

## 2020-06-19 MED ORDER — ROCURONIUM BROMIDE 100 MG/10ML IV SOLN
INTRAVENOUS | Status: DC | PRN
  Administered 2020-06-19: 100 mg via INTRAVENOUS
  Administered 2020-06-19: 30 mg via INTRAVENOUS

## 2020-06-19 MED ORDER — HYDRALAZINE HCL 20 MG/ML IJ SOLN
INTRAMUSCULAR | Status: DC | PRN
  Administered 2020-06-19: 10 mg via INTRAVENOUS

## 2020-06-19 MED ORDER — PROTAMINE SULFATE 10 MG/ML IV SOLN
INTRAVENOUS | Status: AC
  Filled 2020-06-19: qty 5

## 2020-06-19 MED ORDER — SODIUM CHLORIDE 0.9 % IV SOLN: 250.0000 mL | INTRAVENOUS | Status: DC

## 2020-06-19 SURGICAL SUPPLY — 48 items
BAG BANDED W/RUBBER/TAPE 36X54 (MISCELLANEOUS) ×2 IMPLANT
BLADE OSCILLATING /SAGITTAL (BLADE) IMPLANT
BLADE STERNUM SYSTEM 6 (BLADE) ×2 IMPLANT
BLADE SURG 10 STRL SS (BLADE) ×2 IMPLANT
BNDG COHESIVE 4X5 WHT NS (GAUZE/BANDAGES/DRESSINGS) IMPLANT
BRUSH SCRUB EZ PLAIN DRY (MISCELLANEOUS) ×6 IMPLANT
CABLE ADAPT PACING TEMP 12FT (ADAPTER) ×2 IMPLANT
CANISTER SUCT 3000ML PPV (MISCELLANEOUS) ×2 IMPLANT
CATH RIGHTSITE C315HIS02 (CATHETERS) ×2 IMPLANT
CATH S G BIP PACING (CATHETERS) ×2 IMPLANT
COVER BACK TABLE 60X90IN (DRAPES) ×2 IMPLANT
COVER PROBE W GEL 5X96 (DRAPES) ×2 IMPLANT
COVER WAND RF STERILE (DRAPES) ×2 IMPLANT
DRAPE CARDIOVASCULAR INCISE (DRAPES) ×2
DRAPE SRG 135X102X78XABS (DRAPES) ×1 IMPLANT
DRSG OPSITE 6X11 MED (GAUZE/BANDAGES/DRESSINGS) IMPLANT
ELECT REM PT RETURN 9FT ADLT (ELECTROSURGICAL) ×4
ELECTRODE REM PT RTRN 9FT ADLT (ELECTROSURGICAL) ×2 IMPLANT
GAUZE 4X4 16PLY RFD (DISPOSABLE) IMPLANT
GAUZE SPONGE 4X4 12PLY STRL (GAUZE/BANDAGES/DRESSINGS) IMPLANT
GAUZE SPONGE 4X4 12PLY STRL LF (GAUZE/BANDAGES/DRESSINGS) ×2 IMPLANT
GLOVE BIOGEL PI IND STRL 7.5 (GLOVE) ×1 IMPLANT
GLOVE BIOGEL PI IND STRL 8.5 (GLOVE) ×1 IMPLANT
GLOVE BIOGEL PI INDICATOR 7.5 (GLOVE) ×1
GLOVE BIOGEL PI INDICATOR 8.5 (GLOVE) ×1
GLOVE ECLIPSE 8.0 STRL XLNG CF (GLOVE) ×2 IMPLANT
GOWN STRL REUS W/ TWL LRG LVL3 (GOWN DISPOSABLE) IMPLANT
GOWN STRL REUS W/ TWL XL LVL3 (GOWN DISPOSABLE) ×1 IMPLANT
GOWN STRL REUS W/TWL LRG LVL3 (GOWN DISPOSABLE)
GOWN STRL REUS W/TWL XL LVL3 (GOWN DISPOSABLE) ×2
IPG PACE AZUR XT DR MRI W1DR01 (Pacemaker) ×1 IMPLANT
KIT TURNOVER KIT B (KITS) ×2 IMPLANT
LEAD SELECT SECURE 3830 383069 (Lead) ×1 IMPLANT
NEEDLE PERC 18GX7CM (NEEDLE) ×2 IMPLANT
PACE AZURE XT DR MRI W1DR01 (Pacemaker) ×2 IMPLANT
PAD ARMBOARD 7.5X6 YLW CONV (MISCELLANEOUS) ×4 IMPLANT
PAD ELECT DEFIB RADIOL ZOLL (MISCELLANEOUS) ×2 IMPLANT
POUCH AIGIS-R ANTIBACT PPM (Mesh General) ×2 IMPLANT
SELECT SECURE 3830 383069 (Lead) ×2 IMPLANT
SLEEVE REPOSITIONING LENGTH 30 (MISCELLANEOUS) ×2 IMPLANT
SUT PROLENE 2 0 SH DA (SUTURE) IMPLANT
TAPE CLOTH SURG 4X10 WHT LF (GAUZE/BANDAGES/DRESSINGS) ×2 IMPLANT
TOWEL GREEN STERILE (TOWEL DISPOSABLE) ×4 IMPLANT
TOWEL GREEN STERILE FF (TOWEL DISPOSABLE) ×4 IMPLANT
TRAY FOLEY SLVR 16FR TEMP STAT (SET/KITS/TRAYS/PACK) ×2 IMPLANT
TUBE CONNECTING 12X1/4 (SUCTIONS) IMPLANT
WIRE HI TORQ VERSACORE J 260CM (WIRE) ×2 IMPLANT
YANKAUER SUCT BULB TIP NO VENT (SUCTIONS) IMPLANT

## 2020-06-19 NOTE — CV Procedure (Signed)
EP procedure note  Procedure performed: Extraction of a right ventricular pacing lead which had resulted in increasing pacing thresholds, insertion of a new right ventricular pacing lead, removal of a previously implanted dual-chamber pacemaker, insertion of a new dual-chamber pacemaker, and insertion of a temporary transvenous pacemaker  Preoperative diagnosis: Complete heart block status post dual-chamber pacemaker insertion with right ventricular lead dysfunction resulting in an elevated pacing threshold, in the setting of persistent atrial fibrillation  Postoperative diagnosis: Same as preoperative diagnosis  Description of the procedure: After informed consent was obtained, the patient was taken to the operating room in the fasting state. The anesthesia service was utilized to provide general anesthesia and invasive hemodynamic monitoring utilizing a catheter in the left radial artery. After the appropriate timeout, a sheath was inserted percutaneously in the right femoral vein. Attention was then turned to the dual-chamber pacing system.  A 5 cm incision was carried out over the old pacemaker insertion site. Electrocautery was utilized to dissect down to the fascia. The leads were freed up of their dense fibrous adhesions with electrocautery. The left subclavian vein was punctured. 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 A venogram was carried out directly in the subclavian vein demonstrating an extensive system of collateral blood vessels.  A temporary pacemaker was inserted percutaneously in the right femoral vein and advanced under fluoroscopic guidance into the right ventricle. The pacing threshold was a volt at 1 ms. With the satisfactory parameters, attention was turned to removal of the right ventricular pacing lead. This was a Medtronic model 3830-lead. The lead was cut. A Cook bulldog extension wire was attached to the distal  portion of the lead body. The Cook short RL 9 French dissection sheath was advanced over the pacing lead and into the innominate vein, traversing the total occlusion. The Cook long RL 9 [place] dissection sheath was then advanced over the lead to the junction of the innominate vein and the superior vena cava where there was a dense fibrous scar tissue and the lead was attached to the right atrial lead. A [employer]ranetics long 81 French sheath was then attempted to be utilized but was unsuccessful in freeing up the scar tissue. The ventricular lead started to unravel. The [employer]etics 11 French sheath was removed and the [place] RL 9 [place] sheath was then advanced back over the RV pacing lead without the outer sheath and utilizing a combination of traction and countertraction, the lead was removed in total. Access was maintained. A 9 French short sheath and a 7 [place] guiding catheter was advanced into the right atrium over the guidewire. The Medtronic bipolar active-fixation pacing lead, serial number [id number], was advanced through the guiding catheter onto the RV septum. The lead was actively fixed. The paced R waves were 16. The pacing impedance was 900 ohms. The threshold was a volt at 0.4 ms. With the satisfactory parameters, the sheaths were liberated from the lead in the usual manner as well as the guiding catheter. The leads were secured to the fascia with a figure-of-eight silk suture. The sewing sleeves were secured with Ethilon suture. The pocket was irrigated with antibiotic irrigation. The previous Medtronic dual-chamber pacemaker had been removed and the new Medtronic dual-chamber pacemaker, serial [id number], was connected to the old atrial and new RV pacing leads and placed in the subcutaneous pocket. The pocket was irrigated with antibiotic irrigation. The incision was closed with 2 layers of Vicryl suture. Benzoin  and Steri-Strips were pain on the skin. A bandage was applied. The patient was  returned to his room in stable condition.  Complications: There were no immediate procedural complications of note attempts by the anesthesia service to place a transesophageal echo probe were met by resistance, and were unsuccessful.  Conclusion: Successful removal of a failing  Salome Spotted  Crissie Sickles, MD

## 2020-06-19 NOTE — H&P (Signed)
HPI Miranda Sexton presents today to discuss PM lead extraction in more detail. She is a pleasant 79 yo woman with CHB, s/p PPM insertion. She has a his bundle lead position and has been found to have increasing thresholds. She is approaching ERI on her device. Her threshold has been gradually increasing. Allergies  Allergen Reactions   Codeine Nausea And Vomiting   Penicillins Rash and Other (See Comments)    Has patient had a PCN reaction causing immediate rash, facial/tongue/throat swelling, SOB or lightheadedness with hypotension: Yes Has patient had a PCN reaction causing severe rash involving mucus membranes or skin necrosis: No Has patient had a PCN reaction that required hospitalization No Has patient had a PCN reaction occurring within the last 10 years: No If all of the above answers are "NO", then may proceed with Cephalosporin use.            Current Outpatient Medications  Medication Sig Dispense Refill   acetaminophen (TYLENOL) 325 MG tablet Take 325 mg by mouth every 6 (six) hours as needed for mild pain or headache.     albuterol (PROAIR HFA) 108 (90 Base) MCG/ACT inhaler Inhale 2 puffs into the lungs every 6 (six) hours as needed for wheezing or shortness of breath.      apixaban (ELIQUIS) 5 MG TABS tablet Take 1 tablet (5 mg total) by mouth 2 (two) times daily. 60 tablet 5   atorvastatin (LIPITOR) 40 MG tablet Take 40 mg by mouth daily.     benazepril (LOTENSIN) 40 MG tablet Take 40 mg by mouth daily.     flecainide (TAMBOCOR) 50 MG tablet Take 1 tablet (50 mg total) by mouth 2 (two) times daily. 180 tablet 3   furosemide (LASIX) 40 MG tablet Take 80 mg by mouth daily.     glipiZIDE (GLUCOTROL XL) 10 MG 24 hr tablet Take 10 mg by mouth daily.      insulin aspart protamine- aspart (NOVOLOG 70/30) (70-30) 100 UNIT/ML injection Inject 46-48 Units into the skin See admin instructions. Inject 46 units in the morning & 48 units in the evening.      metoprolol succinate (TOPROL-XL) 25 MG 24 hr tablet TAKE 1/2 TABLET(12.5 MG) BY MOUTH DAILY 45 tablet 1   nitroGLYCERIN (NITROSTAT) 0.4 MG SL tablet Place 1 tablet (0.4 mg total) under the tongue every 5 (five) minutes as needed for chest pain. 30 tablet 2   ONETOUCH VERIO test strip 2 (two) times daily.     potassium chloride SA (K-DUR) 20 MEQ tablet Take 1 tablet (20 mEq total) by mouth daily. 90 tablet 0   TRULICITY 1.61 WR/6.0AV SOPN Inject 0.5 mLs into the skin once a week.     venlafaxine XR (EFFEXOR-XR) 150 MG 24 hr capsule Take 150 mg by mouth daily.     vitamin B-12 (CYANOCOBALAMIN) 50 MCG tablet Take 50 mcg by mouth daily.     No current facility-administered medications for this visit.         Past Medical History:  Diagnosis Date   Anxiety    Asthma    Carotid artery occlusion    Chronic bronchitis (HCC)    Chronic diastolic heart failure (HCC)    CKD (chronic kidney disease) stage 2, GFR 60-89 ml/min    COPD (chronic obstructive pulmonary disease) (HCC)    Coronary atherosclerosis of native coronary artery    Nonobstructive 08/2008   Daily headache    DJD (degenerative joint disease)    Essential hypertension  GERD (gastroesophageal reflux disease)    Hyperlipidemia    Obesity    On home oxygen therapy    PAD (peripheral artery disease) (HCC)    Moderate right renal artery stenosis 08/2008   Paroxysmal atrial fibrillation (HCC)    Sick sinus syndrome (HCC)    Medtronic dual-chamber his bundle pacemaker - Dr. Rayann Heman   Type 2 diabetes mellitus ([place])     ROS:   All systems reviewed and negative except as noted in the HPI.        Past Surgical History:  Procedure Laterality Date   BIOPSY  08/03/2017   Procedure: BIOPSY;  Surgeon: [name], MD;  [facility]: [facility];  Service: Endoscopy;;  gastric    CARDIAC CATHETERIZATION  2014   CATARACT EXTRACTION W/ INTRAOCULAR LENS   IMPLANT, BILATERAL Bilateral 2008   COLONOSCOPY N/A 08/03/2017   Procedure: COLONOSCOPY;  Surgeon: [name], MD;  Location: [facility];  Service: Endoscopy;  Laterality: N/A;   ENDARTERECTOMY Right 12/08/2012   Procedure: ENDARTERECTOMY CAROTID;  Surgeon: [name], MD;  Location: [facility];  Service: Vascular;  Laterality: Right;   EP IMPLANTABLE DEVICE N/A 03/02/2016   Procedure: Pacemaker Implant;  Surgeon: [name], MD;  Location: [place] [facility] [facility];  Service: Cardiovascular;  Laterality: N/A;   ESOPHAGOGASTRODUODENOSCOPY N/A 08/03/2017   Procedure: ESOPHAGOGASTRODUODENOSCOPY (EGD);  Surgeon: [name], MD;  [facility];  Service: Endoscopy;  Laterality: N/A;   ESOPHAGOGASTRODUODENOSCOPY (EGD) WITH PROPOFOL N/A 05/11/2019   Procedure: ESOPHAGOGASTRODUODENOSCOPY (EGD) WITH PROPOFOL;  Surgeon: [name], MD;  [facility]: [facility];  Service: Endoscopy;  Laterality: N/A;  145pm   INSERT / REPLACE / REMOVE PACEMAKER  03/02/2016   LEFT HEART CATH AND CORONARY ANGIOGRAPHY N/A 06/20/2018   Procedure: LEFT HEART CATH AND CORONARY ANGIOGRAPHY;  Surgeon: [name], MD;  Location: [place] [facility];  Service: Cardiovascular;  Laterality: N/A;   PARTIAL COLECTOMY N/A 10/19/2017   Procedure: PARTIAL COLECTOMY;  Surgeon: [name], MD;  Location: [facility];  Service: General;  Laterality: N/A;   POLYPECTOMY  08/03/2017   Procedure: POLYPECTOMY;  Surgeon: [name], MD;  [facility];  Service: Endoscopy;;  colon          Family History  Problem Relation Age of Onset   Cancer Mother        Stomach   Deep vein thrombosis Mother    Diabetes Mother    Hypertension Mother    Heart disease Mother    Hypertension Father    Diabetes Sister    Hyperlipidemia Sister    Hypertension Sister    Heart disease Sister    Diabetes Brother    Hyperlipidemia Brother    Hypertension Brother       Social History        Socioeconomic History   Marital status: Married    Spouse name: Not on file   Number of children: Not on file   Years of education: Not on file   Highest education level: Not on file  Occupational History   Not on file  Tobacco Use   Smoking status: Never Smoker   Smokeless tobacco: Never Used  Vaping Use   Vaping Use: Never used  Substance and Sexual Activity   Alcohol use: No    Alcohol/week: 0.0 standard drinks   Drug use: No   Sexual activity: Not Currently    Birth control/protection: None  Other Topics Concern  Not on file  Social History Narrative   Lives in [place] with spouse.  4 grown children.   Retired but continues to clean houses   Social Determinants of Scientist, physiological Strain:    Difficulty of Paying Living Expenses: Not on file  Food Insecurity:    Worried About Charity fundraiser in the Last Year: Not on file   [employer] of Food in the Last Year: Not on file  Transportation Needs:    Film/video editor (Medical): Not on file   Lack of Transportation (Non-Medical): Not on file  Physical Activity:    Days of Exercise per Week: Not on file   Minutes of Exercise per Session: Not on file  Stress:    Feeling of Stress : Not on file  Social Connections:    Frequency of Communication with Friends and Family: Not on file   Frequency of Social Gatherings with Friends and Family: Not on file   Attends Religious Services: Not on file   Active Member of Clubs or Organizations: Not on file   Attends Archivist Meetings: Not on file   Marital Status: Not on file  Intimate Partner Violence:    Fear of Current or Ex-Partner: Not on file   Emotionally Abused: Not on file   Physically Abused: Not on file   Sexually Abused: Not on file     BP 128/66    Pulse 84    Ht 5\' 5"  (1.651 m)    Wt 220 lb 12.8 oz (100.2 kg)    SpO2 95%    BMI 36.74 kg/m   Physical  Exam:  Well appearing NAD HEENT: Unremarkable Neck:  No JVD, no thyromegally Lymphatics:  No adenopathy Back:  No CVA tenderness Lungs:  Clear with no wheezes HEART:  Regular rate rhythm, no murmurs, no rubs, no clicks Abd:  soft, positive bowel sounds, no organomegally, no rebound, no guarding, healed abdominal wound Ext:  2 plus pulses, no edema, no cyanosis, no clubbing Skin:  No rashes no nodules Neuro:  CN II through XII intact, motor grossly intact  EKG - atrial fib with ventricular pacing  DEVICE  Normal device function.  See PaceArt for details.   Assess/Plan: 1. PM lead dysfunction - her threshold remains elevated. I have recommended PM system extraction and discussed the indications/risks/benefits/goals/expectations and she wishes to proceed. 2. Atrial fib - we will hold her anti-coagulation for a couple of days prior to her procedure.  3. CHB - she has an escape today of 35/min. We will follow.  Salome Spotted  EP Attending  Patient seen and examined. Agree with above. The patient presents for insertion of a new RV pacing lead. She has reached ERI and her thresholds are elevated. The risks/benefits/goals/expectations of PM lead extraction and insertion of a new PM lead were reviewed and she wishes to proceed.  Miranda Overlie Jaelin Fackler,MD

## 2020-06-19 NOTE — Anesthesia Procedure Notes (Signed)
Arterial Line Insertion Start/End12/16/2021 1:15 PM, 06/19/2020 1:25 PM Performed by: Lavell Luster, CRNA  Preanesthetic checklist: patient identified, IV checked, risks and benefits discussed, surgical consent, pre-op evaluation and timeout performed Lidocaine 1% used for infiltration Left, radial was placed Catheter size: 20 G Hand hygiene performed , maximum sterile barriers used  and Seldinger technique used Allen's test indicative of satisfactory collateral circulation Attempts: 2 Procedure performed without using ultrasound guided technique. Following insertion, Biopatch and dressing applied. Post procedure assessment: normal  Patient tolerated the procedure well with no immediate complications. Additional procedure comments: Attempted on right side, faint pulse noted and no arterial flash.  Moved to left side which was successful. Verified with Dr Tobias Alexander prior to sticking left radial.  Henderson Cloud, CRNA.

## 2020-06-19 NOTE — Anesthesia Procedure Notes (Signed)
Procedure Name: Intubation Date/Time: 06/19/2020 4:14 PM Performed by: Janene Harvey, CRNA Pre-anesthesia Checklist: Patient identified, Emergency Drugs available, Suction available and Patient being monitored Patient Re-evaluated:Patient Re-evaluated prior to induction Oxygen Delivery Method: Circle system utilized Preoxygenation: Pre-oxygenation with 100% oxygen Induction Type: IV induction Ventilation: Mask ventilation without difficulty Laryngoscope Size: Mac and 4 Grade View: Grade I Tube type: Oral Tube size: 7.0 mm Number of attempts: 1 Airway Equipment and Method: Stylet and Oral airway Placement Confirmation: ETT inserted through vocal cords under direct vision,  positive ETCO2 and breath sounds checked- equal and bilateral Secured at: 22 cm Tube secured with: Tape Dental Injury: Teeth and Oropharynx as per pre-operative assessment

## 2020-06-19 NOTE — Transfer of Care (Signed)
Immediate Anesthesia Transfer of Care Note  Patient: Miranda Sexton  Procedure(s) Performed: MEDTRONIC PACEMAKER RV LEAD REMOVAL AND PLACMENT GENREATOR CHANGE OUT, (N/A Chest)  Patient Location: PACU  Anesthesia Type:General  Level of Consciousness: awake and alert   Airway & Oxygen Therapy: Patient Spontanous Breathing and Patient connected to face mask oxygen  Post-op Assessment: Report given to RN and Post -op Vital signs reviewed and stable  Post vital signs: Reviewed and stable  Last Vitals:  Vitals Value Taken Time  BP 92/52 06/19/20 1911  Temp    Pulse 70 06/19/20 1919  Resp 21 06/19/20 1919  SpO2 100 % 06/19/20 1919  Vitals shown include unvalidated device data.  Last Pain:  Vitals:   06/19/20 1257  TempSrc:   PainSc: 0-No pain         Complications: No complications documented.

## 2020-06-20 ENCOUNTER — Encounter (HOSPITAL_COMMUNITY): Payer: Self-pay | Admitting: Cardiology

## 2020-06-20 ENCOUNTER — Ambulatory Visit (HOSPITAL_COMMUNITY): Payer: Medicare HMO

## 2020-06-20 DIAGNOSIS — Z79899 Other long term (current) drug therapy: Secondary | ICD-10-CM | POA: Diagnosis not present

## 2020-06-20 DIAGNOSIS — I4819 Other persistent atrial fibrillation: Secondary | ICD-10-CM | POA: Diagnosis not present

## 2020-06-20 DIAGNOSIS — I442 Atrioventricular block, complete: Secondary | ICD-10-CM | POA: Diagnosis not present

## 2020-06-20 DIAGNOSIS — J9811 Atelectasis: Secondary | ICD-10-CM | POA: Diagnosis not present

## 2020-06-20 DIAGNOSIS — Z885 Allergy status to narcotic agent status: Secondary | ICD-10-CM | POA: Diagnosis not present

## 2020-06-20 DIAGNOSIS — I4891 Unspecified atrial fibrillation: Secondary | ICD-10-CM | POA: Diagnosis not present

## 2020-06-20 DIAGNOSIS — Z880 Allergy status to penicillin: Secondary | ICD-10-CM | POA: Diagnosis not present

## 2020-06-20 DIAGNOSIS — Z7901 Long term (current) use of anticoagulants: Secondary | ICD-10-CM | POA: Diagnosis not present

## 2020-06-20 DIAGNOSIS — T82110A Breakdown (mechanical) of cardiac electrode, initial encounter: Secondary | ICD-10-CM | POA: Diagnosis not present

## 2020-06-20 DIAGNOSIS — Z45010 Encounter for checking and testing of cardiac pacemaker pulse generator [battery]: Secondary | ICD-10-CM | POA: Diagnosis not present

## 2020-06-20 DIAGNOSIS — Z794 Long term (current) use of insulin: Secondary | ICD-10-CM | POA: Diagnosis not present

## 2020-06-20 LAB — GLUCOSE, CAPILLARY
Glucose-Capillary: 263 mg/dL — ABNORMAL HIGH (ref 70–99)
Glucose-Capillary: 265 mg/dL — ABNORMAL HIGH (ref 70–99)

## 2020-06-20 LAB — SURGICAL PCR SCREEN
MRSA, PCR: NEGATIVE
Staphylococcus aureus: NEGATIVE

## 2020-06-20 MED ORDER — APIXABAN 5 MG PO TABS: 5.0000 mg | ORAL_TABLET | Freq: Two times a day (BID) | ORAL | 5 refills | Status: DC

## 2020-06-20 NOTE — Care Management Important Message (Signed)
Important Message  Patient Details  Name: Miranda Sexton MRN: [id number] Date of Birth: 06-05-1941   Medicare Important Message Given:  Yes  Patient left prior to IM delivery .  IM mailed to the patient home address.    [name] 06/20/2020, 1:10 PM

## 2020-06-20 NOTE — Anesthesia Postprocedure Evaluation (Signed)
Anesthesia Post Note  Patient: Miranda Sexton  Procedure(s) Performed: MEDTRONIC PACEMAKER RV LEAD REMOVAL AND PLACMENT GENREATOR CHANGE OUT, (N/A Chest)     Patient location during evaluation: PACU Anesthesia Type: General Level of consciousness: sedated Pain management: pain level controlled Vital Signs Assessment: post-procedure vital signs reviewed and stable Respiratory status: spontaneous breathing and respiratory function stable Cardiovascular status: stable Postop Assessment: no apparent nausea or vomiting Anesthetic complications: no   No complications documented.                Cloma Rahrig DANIEL

## 2020-06-20 NOTE — Discharge Instructions (Signed)
After Your Pacemaker Procedure  . Do not lift your arm above shoulder height for 1 week after your procedure. After 7 days, you may progress as below.  . You should remove your sling 24 hours after your procedure, unless otherwise instructed by your provider.     Friday June 27, 2020  Saturday June 28, 2020 Sunday June 29, 2020 Monday June 30, 2020   . Do not lift, push, pull, or carry anything over 10 pounds with the affected arm until 6 weeks (Friday August 01, 2020) after your procedure.   . Do not drive until your wound check or until instructed by your healthcare provider that you are safe to do so.   . Monitor your surgical site for redness, swelling, and drainage. Call the device clinic at [phone] if you experience these symptoms or fever/chills.  . If your incision is sealed with Steri-strips or staples. You may shower 7 days after your procedure and wash your incision with soap and water as long as it is healed. If your incision is closed with Dermabond/Surgical glue. You may shower 1 day after your pacemaker implant and wash your incision with soap and water. Avoid lotions, ointments, or perfumes over your incision until it is well-healed.  . You may use a hot tub or a pool AFTER your wound check appointment if the incision is completely closed.   . Your cardiac device may be MRI compatible. We will discuss this at your office visit/Wound check  . Remote monitoring is used to monitor your cardiac device from home. This monitoring is scheduled every 91 days by our office. It allows [place] to keep an eye on the functioning of your device to ensure it is working properly. You will routinely see your Electrophysiologist annually (more often if necessary).   Implantable Cardiac Device Lead Replacement, Care After This sheet gives you information about how to care for yourself after your procedure. Your health care provider may also give you more specific instructions.  If you have problems or questions, contact your health care provider. What can I expect after the procedure? After your procedure, it is common to have:  Mild discomfort at the incision site.  A small amount of drainage or bleeding at the incision site. This is usually no more than a spot. Follow these instructions at home: Incision care         Follow instructions from your health care provider about how to take care of your incision. Make sure you: ? Leave stitches (sutures), skin glue, or adhesive strips in place. These skin closures may need to stay in place for 2 weeks or longer. If adhesive strip edges start to loosen and curl up, you may trim the loose edges. Do not remove adhesive strips completely unless your health care provider tells you to do that.  Check your incision area every day for signs of infection. Check for: ? More redness, swelling, or pain. ? More fluid or blood. ? Warmth. ? Pus or a bad smell. Electric and Quarry manager cell phones should be kept 12 inches (30 cm) away from the cardiac device when they are on. When talking on a cell phone, use the ear on the opposite side of your cardiac device.  Do not place a cell phone in a pocket next to the cardiac device.  Household appliances do not interfere with modern-day cardiac device. Medicines  Take over-the-counter and prescription medicines only as told by your health care provider. General  instructions  Do not raise the arm on the side of your procedure higher than your shoulder for at least 7 days. Except for this restriction, continue to use your arm as normal to prevent problems.  Do not take baths, swim, or use a hot tub until your health care provider says it is okay to do so. You may shower as directed by your health care provider.  Do not lift anything that is heavier than 10 lb (4.5 kg) for 6 weeks or the limit that your health care provider tells you, until he or she says that it is  safe.  Return to your normal activities after 2 weeks, or as told by your health care provider. Ask your health care provider what activities are safe for you.  Keep all follow-up visits as told by your health care provider. This is important. Contact a health care provider if:  You have more redness, swelling, or pain around your incision.  You have more fluid or blood coming from your incision.  Your incision feels warm to the touch.  You have pus or a bad smell coming from your incision.  You have a fever.  The arm or hand on the side of the cardiac device becomes swollen.  The symptoms you had before your procedure are not getting better. Get help right away if:  You develop chest pain.  You feel like you will faint.  You feel light-headed.  You faint. Summary  Check your incision area every day for signs of infection, such as more fluid or blood. A small amount of drainage or bleeding at the incision site is normal.  Do not raise the arm on the side of your procedure higher than your shoulder for at least 5 days, or as long as directed by your health care provider.  Digital cell phones should be kept 12 inches (30 cm) away from the cardiac device when they are on. When talking on a cell phone, use the ear on the opposite side of your cardiac device.  If the symptoms that led to having your lead replaced are not getting better, contact your health care provider. This information is not intended to replace advice given to you by your health care provider. Make sure you discuss any questions you have with your health care provider.

## 2020-06-20 NOTE — Discharge Summary (Addendum)
ELECTROPHYSIOLOGY PROCEDURE DISCHARGE SUMMARY    Patient ID: Miranda Sexton,  MRN: [id number], DOB/AGE: [birth year] 79 y.o.  Admit date: 06/19/2020 Discharge date: 06/20/2020  Primary Care Physician: Glenda Chroman, MD  Primary Cardiologist: Rozann Lesches, MD Primary EP: [name], MD -> Dr. Clance Boll lead extraction  Primary Discharge Diagnosis:  Active Problems:   Complete heart block (HCC)  Increased RV lead threshold s/p extraction and lead revision this admission  Secondary Discharge Diagnosis Pacemaker generator change this admission  Atrial fibrillation  Allergies  Allergen Reactions   Codeine Nausea And Vomiting   Penicillins Rash and Other (See Comments)    Has patient had a PCN reaction causing immediate rash, facial/tongue/throat swelling, SOB or lightheadedness with hypotension: Yes Has patient had a PCN reaction causing severe rash involving mucus membranes or skin necrosis: No Has patient had a PCN reaction that required hospitalization No Has patient had a PCN reaction occurring within the last 10 years: No If all of the above answers are "NO", then may proceed with Cephalosporin use.     Procedures This Admission:  1. Extraction of an RV lead with increased pacing threshold with implantation of a new Medtronic model number [id number] lead on 06/19/2020 by Dr. Lovena Le and Dr. Quentin Ore 2.  Generator change-out of a Medtronic PPM on 06/19/2020 by  Dr. Lovena Le and Dr. Quentin Ore . Pt received a Medtronic generator model number [id number].There were no immediate post procedure complications 3.  CXR on 06/20/20 demonstrated no pneumothorax status gen change and lead extraction  Brief HPI:  Miranda Sexton is a 79 y.o. female seen by Electrophysiology in the outpatient setting for consideration of lead extraction and generator change out due to increased RV thresholds. Past medical history includes above. Risk, benefits, and alternatives to implantable cardiac device  system revision were reviewed with the patient who wished to proceed.   Hospital Course:  The patient was admitted and underwent lead extraction in the OR as well as generator change to a Medtronic dual chamber PPM with details as outlined above.  she was monitored on telemetry overnight which demonstrated appropriate pacing. Left chest was without hematoma or ecchymosis.  The device was interrogated and found to be functioning normally.  CXR was obtained and demonstrated no pneumothorax status post device implantation.  Wound care, arm mobility, and restrictions were reviewed with the patient.  The patient was examined and considered stable for discharge to home with close follow up as below.   Physical Exam: Vitals:   06/19/20 2116 06/19/20 2342 06/20/20 0533 06/20/20 0908  BP: 109/60 123/62 (!) 106/59 (!) 106/55  Pulse: 69 70 70 70  Resp:  17 20   Temp: (!) 97.3 F (36.3 C) (!) 97.3 F (36.3 C) (!) 97.4 F (36.3 C)   TempSrc: Oral Oral Oral   SpO2: 100% 98% 98%   Weight:      Height:        GEN- The patient is well appearing, alert and oriented x 3 today.   HEENT: normocephalic, atraumatic; sclera clear, conjunctiva pink; hearing intact; oropharynx clear; neck supple, no JVP Lymph- no cervical lymphadenopathy Lungs- Clear to ausculation bilaterally, normal work of breathing.  No wheezes, rales, rhonchi Heart- Regular rate and rhythm, no murmurs, rubs or gallops, PMI not laterally displaced GI- soft, non-tender, non-distended, bowel sounds present, no hepatosplenomegaly Extremities- no clubbing, cyanosis, or edema; DP/PT/radial pulses 2+ bilaterally MS- no significant deformity or atrophy Skin- warm and dry, no rash or lesion Psych-  euthymic mood, full affect Neuro- strength and sensation are intact    Labs:   Lab Results  Component Value Date   WBC 5.0 06/17/2020   HGB 12.5 06/17/2020   HCT 38.7 06/17/2020   MCV 89 06/17/2020   PLT 279 06/17/2020    Recent Labs  Lab  06/17/20 1519  NA 140  K 4.5  CL 101  CO2 29  BUN 15  CREATININE 1.04*  CALCIUM 9.3  GLUCOSE 113*     Discharge Medications:  Allergies as of 06/20/2020       Reactions   Codeine Nausea And Vomiting   Penicillins Rash, Other (See Comments)   Has patient had a PCN reaction causing immediate rash, facial/tongue/throat swelling, SOB or lightheadedness with hypotension: Yes Has patient had a PCN reaction causing severe rash involving mucus membranes or skin necrosis: No Has patient had a PCN reaction that required hospitalization No Has patient had a PCN reaction occurring within the last 10 years: No If all of the above answers are "NO", then may proceed with Cephalosporin use.        Medication List     TAKE these medications    acetaminophen 325 MG tablet Commonly known as: TYLENOL Take 325 mg by mouth every 6 (six) hours as needed for mild pain or headache.   albuterol 108 (90 Base) MCG/ACT inhaler Commonly known as: VENTOLIN HFA Inhale 2 puffs into the lungs every 6 (six) hours as needed for wheezing or shortness of breath.   apixaban 5 MG Tabs tablet Commonly known as: Eliquis Take 1 tablet (5 mg total) by mouth 2 (two) times daily. Resume Tuesday, 12/21 with morning dose. Start taking on: June 24, 2020 What changed:  additional instructions These instructions start on June 24, 2020. If you are unsure what to do until then, ask your doctor or other care provider.   ascorbic acid 500 MG tablet Commonly known as: VITAMIN C Take 500 mg by mouth daily.   atorvastatin 40 MG tablet Commonly known as: LIPITOR Take 40 mg by mouth daily.   benazepril 40 MG tablet Commonly known as: LOTENSIN Take 40 mg by mouth daily.   flecainide 50 MG tablet Commonly known as: TAMBOCOR Take 1 tablet (50 mg total) by mouth 2 (two) times daily.   furosemide 40 MG tablet Commonly known as: LASIX Take 80 mg by mouth daily.   glipiZIDE 10 MG 24 hr tablet Commonly  known as: GLUCOTROL XL Take 10 mg by mouth daily.   insulin aspart protamine- aspart (70-30) 100 UNIT/ML injection Commonly known as: NOVOLOG MIX 70/30 Inject 30-46 Units into the skin See admin instructions. Inject 46 units in the morning & 30 units in the evening.   metoprolol succinate 25 MG 24 hr tablet Commonly known as: TOPROL-XL TAKE 1/2 TABLET(12.5 MG) BY MOUTH DAILY What changed: See the new instructions.   moxifloxacin 0.5 % ophthalmic solution Commonly known as: VIGAMOX Place 1 drop into both eyes in the morning and at bedtime.   nitroGLYCERIN 0.4 MG SL tablet Commonly known as: NITROSTAT Place 1 tablet (0.4 mg total) under the tongue every 5 (five) minutes as needed for chest pain. What changed: when to take this   OneTouch Verio test strip Generic drug: glucose blood 2 (two) times daily.   potassium chloride SA 20 MEQ tablet Commonly known as: KLOR-CON Take 1 tablet (20 mEq total) by mouth daily.   Trulicity 4.74 QV/9.5GL Sopn Generic drug: Dulaglutide Inject 0.75 mg into the skin  every Tuesday.   venlafaxine XR 150 MG 24 hr capsule Commonly known as: EFFEXOR-XR Take 150 mg by mouth daily.   vitamin B-12 50 MCG tablet Commonly known as: CYANOCOBALAMIN Take 50 mcg by mouth daily.        Disposition: Pt is being discharged home today in good condition.    Follow-up Information     Evans Lance, MD Follow up on 09/23/2020.   Specialty: Cardiology Why: at 330 for 3 month pacemaker check Contact information: [address] [place] [address] [phone]         [facility] Follow up on 07/03/2020.   Why: at 320 for pacemaker wound check  Contact information: [place] [address] [phone]                Duration of Discharge Encounter: Greater than 30 minutes including physician time.  Jacalyn Lefevre,  PA-C  06/20/2020 10:06 AM  EP Attending  Patient seen and examined. Agree with above. The patient is stable after PM lead extraction and insertion of a new PPM and RV lead. Her PM has been interrogated under my direct supervision and is working normally. CXR looks good. She will hold her blood thinner for 4 days.   Carleene Overlie Ezabella Teska,MD

## 2020-06-22 LAB — TYPE AND SCREEN
ABO/RH(D): O POS
Antibody Screen: NEGATIVE
Unit division: 0
Unit division: 0
Unit division: 0
Unit division: 0

## 2020-06-22 LAB — BPAM RBC
Blood Product Expiration Date: 202201132359
Blood Product Expiration Date: 202201162359
Blood Product Expiration Date: 202201172359
Blood Product Expiration Date: 202201172359
ISSUE DATE / TIME: 202112161558
ISSUE DATE / TIME: 202112161558
ISSUE DATE / TIME: 202112161558
ISSUE DATE / TIME: 202112161558
Unit Type and Rh: 5100
Unit Type and Rh: 5100
Unit Type and Rh: 5100
Unit Type and Rh: 5100

## 2020-06-24 ENCOUNTER — Encounter (HOSPITAL_COMMUNITY): Payer: Self-pay | Admitting: Internal Medicine

## 2020-07-03 ENCOUNTER — Ambulatory Visit (INDEPENDENT_AMBULATORY_CARE_PROVIDER_SITE_OTHER): Payer: Medicare HMO | Admitting: Emergency Medicine

## 2020-07-03 ENCOUNTER — Ambulatory Visit
Admission: RE | Admit: 2020-07-03 | Discharge: 2020-07-03 | Disposition: A | Discharge status: Home / Self Care | Payer: Medicare HMO | Source: Ambulatory Visit | Attending: Cardiology | Admitting: Cardiology

## 2020-07-03 ENCOUNTER — Other Ambulatory Visit: Payer: Self-pay

## 2020-07-03 DIAGNOSIS — E119 Type 2 diabetes mellitus without complications: Secondary | ICD-10-CM | POA: Diagnosis not present

## 2020-07-03 DIAGNOSIS — T82110A Breakdown (mechanical) of cardiac electrode, initial encounter: Secondary | ICD-10-CM

## 2020-07-03 DIAGNOSIS — Z950 Presence of cardiac pacemaker: Secondary | ICD-10-CM | POA: Diagnosis not present

## 2020-07-03 DIAGNOSIS — I509 Heart failure, unspecified: Secondary | ICD-10-CM | POA: Diagnosis not present

## 2020-07-03 DIAGNOSIS — I495 Sick sinus syndrome: Secondary | ICD-10-CM | POA: Diagnosis not present

## 2020-07-03 DIAGNOSIS — J449 Chronic obstructive pulmonary disease, unspecified: Secondary | ICD-10-CM | POA: Diagnosis not present

## 2020-07-03 DIAGNOSIS — I517 Cardiomegaly: Secondary | ICD-10-CM | POA: Diagnosis not present

## 2020-07-03 DIAGNOSIS — E1165 Type 2 diabetes mellitus with hyperglycemia: Secondary | ICD-10-CM | POA: Diagnosis not present

## 2020-07-03 LAB — CUP PACEART INCLINIC DEVICE CHECK
Battery Remaining Longevity: 150 mo
Battery Voltage: 3.22 V
Brady Statistic AP VP Percent: 0 %
Brady Statistic AP VS Percent: 0 %
Brady Statistic AS VP Percent: 99.64 %
Brady Statistic AS VS Percent: 0.36 %
Brady Statistic RA Percent Paced: 0 %
Brady Statistic RV Percent Paced: 99.64 %
Date Time Interrogation Session: 20211230154735
Implantable Lead Implant Date: 20170829
Implantable Lead Location: 753859
Implantable Lead Model: 5076
Implantable Pulse Generator Implant Date: 20170829
Lead Channel Impedance Value: 247 Ohm
Lead Channel Impedance Value: 399 Ohm
Lead Channel Impedance Value: 437 Ohm
Lead Channel Impedance Value: 722 Ohm
Lead Channel Pacing Threshold Amplitude: 1.75 V
Lead Channel Pacing Threshold Pulse Width: 0.4 ms
Lead Channel Sensing Intrinsic Amplitude: 0.375 mV
Lead Channel Sensing Intrinsic Amplitude: 21.75 mV
Lead Channel Setting Pacing Amplitude: 3.5 V
Lead Channel Setting Pacing Pulse Width: 0.4 ms
Lead Channel Setting Sensing Sensitivity: 1.2 mV

## 2020-07-03 NOTE — Progress Notes (Signed)
Wound check appointment. Steri-strips removed. Wound without redness. Small amount of edema noted. Soft to palpation and cool to touch.  Distal end has area that appears difficult to see edges of skin d/t light color in scabbing noted. The rest of incision is well approximated. No drainage noted. Normal device function. Sensing and impedances consistent with implant measurements. RV threshold is elevated from 1.0 V @ 0.4 ms at implant to 1.75 V @ 0.4 ms today. VS 30, 99.6% VP. Reports this past Sunday she fell onto her left side and caught herself with her left hand. Dr. Elberta Fortis made aware, CXR ordered. Device programmed at 3.5V/auto capture programmed on for extra safety margin until 3 month visit. Histogram distribution appropriate for patient and level of activity. No mode switches or high ventricular rates noted. Patient educated about wound care, arm mobility, lifting restrictions. ROV in 3 months with Dr. Ladona Ridgel. Next remote 10/03/19.

## 2020-07-03 NOTE — Patient Instructions (Addendum)
Continue to monitor for increased swelling, redness, drainage, fever or chills. We had scheduled you a X-ray and would like for you to have this completed today. Please call with any questions or concerns in the Device Clinic at [phone]   [facility] Address: [address], [place], [place] [address] Phone: [phone]

## 2020-07-07 ENCOUNTER — Encounter (HOSPITAL_COMMUNITY): Payer: Self-pay | Admitting: Internal Medicine

## 2020-07-09 DIAGNOSIS — E1165 Type 2 diabetes mellitus with hyperglycemia: Secondary | ICD-10-CM | POA: Diagnosis not present

## 2020-07-09 DIAGNOSIS — Z23 Encounter for immunization: Secondary | ICD-10-CM | POA: Diagnosis not present

## 2020-07-09 DIAGNOSIS — N183 Chronic kidney disease, stage 3 unspecified: Secondary | ICD-10-CM | POA: Diagnosis not present

## 2020-07-09 DIAGNOSIS — Z299 Encounter for prophylactic measures, unspecified: Secondary | ICD-10-CM | POA: Diagnosis not present

## 2020-07-09 DIAGNOSIS — E1122 Type 2 diabetes mellitus with diabetic chronic kidney disease: Secondary | ICD-10-CM | POA: Diagnosis not present

## 2020-07-09 DIAGNOSIS — J449 Chronic obstructive pulmonary disease, unspecified: Secondary | ICD-10-CM | POA: Diagnosis not present

## 2020-07-09 DIAGNOSIS — Z789 Other specified health status: Secondary | ICD-10-CM | POA: Diagnosis not present

## 2020-07-09 DIAGNOSIS — Z6839 Body mass index (BMI) 39.0-39.9, adult: Secondary | ICD-10-CM | POA: Diagnosis not present

## 2020-07-10 NOTE — Progress Notes (Signed)
Dr. Ladona Ridgel was made aware of patient and CXR on 07/03/20. No further recommendations at that time.

## 2020-07-11 ENCOUNTER — Encounter: Payer: Medicare HMO | Admitting: Internal Medicine

## 2020-07-15 ENCOUNTER — Other Ambulatory Visit: Payer: Self-pay | Admitting: *Deleted

## 2020-07-15 MED ORDER — FLECAINIDE ACETATE 50 MG PO TABS: 50.0000 mg | ORAL_TABLET | Freq: Two times a day (BID) | ORAL | 3 refills | Status: DC

## 2020-07-24 ENCOUNTER — Other Ambulatory Visit: Payer: Self-pay | Admitting: Internal Medicine

## 2020-08-04 DIAGNOSIS — E1165 Type 2 diabetes mellitus with hyperglycemia: Secondary | ICD-10-CM | POA: Diagnosis not present

## 2020-08-04 DIAGNOSIS — K219 Gastro-esophageal reflux disease without esophagitis: Secondary | ICD-10-CM | POA: Diagnosis not present

## 2020-08-04 DIAGNOSIS — I10 Essential (primary) hypertension: Secondary | ICD-10-CM | POA: Diagnosis not present

## 2020-08-19 DIAGNOSIS — I509 Heart failure, unspecified: Secondary | ICD-10-CM | POA: Diagnosis not present

## 2020-08-19 DIAGNOSIS — E1165 Type 2 diabetes mellitus with hyperglycemia: Secondary | ICD-10-CM | POA: Diagnosis not present

## 2020-08-19 DIAGNOSIS — Z299 Encounter for prophylactic measures, unspecified: Secondary | ICD-10-CM | POA: Diagnosis not present

## 2020-08-21 ENCOUNTER — Encounter: Payer: Medicare HMO | Admitting: Internal Medicine

## 2020-08-22 ENCOUNTER — Other Ambulatory Visit: Payer: Self-pay | Admitting: Cardiology

## 2020-08-28 DIAGNOSIS — R42 Dizziness and giddiness: Secondary | ICD-10-CM | POA: Diagnosis not present

## 2020-08-28 DIAGNOSIS — I10 Essential (primary) hypertension: Secondary | ICD-10-CM | POA: Diagnosis not present

## 2020-08-28 DIAGNOSIS — Z299 Encounter for prophylactic measures, unspecified: Secondary | ICD-10-CM | POA: Diagnosis not present

## 2020-08-28 DIAGNOSIS — I4892 Unspecified atrial flutter: Secondary | ICD-10-CM | POA: Diagnosis not present

## 2020-08-28 DIAGNOSIS — I4891 Unspecified atrial fibrillation: Secondary | ICD-10-CM | POA: Diagnosis not present

## 2020-09-01 DIAGNOSIS — E1165 Type 2 diabetes mellitus with hyperglycemia: Secondary | ICD-10-CM | POA: Diagnosis not present

## 2020-09-01 DIAGNOSIS — I10 Essential (primary) hypertension: Secondary | ICD-10-CM | POA: Diagnosis not present

## 2020-09-01 DIAGNOSIS — K219 Gastro-esophageal reflux disease without esophagitis: Secondary | ICD-10-CM | POA: Diagnosis not present

## 2020-09-15 ENCOUNTER — Encounter: Payer: Medicare HMO | Admitting: Internal Medicine

## 2020-09-23 ENCOUNTER — Encounter: Payer: Self-pay | Admitting: Internal Medicine

## 2020-09-23 ENCOUNTER — Other Ambulatory Visit: Payer: Self-pay

## 2020-09-23 ENCOUNTER — Ambulatory Visit (INDEPENDENT_AMBULATORY_CARE_PROVIDER_SITE_OTHER): Payer: Medicare HMO | Admitting: Internal Medicine

## 2020-09-23 VITALS — BP 110/68 | HR 78 | Ht 65.0 in | Wt 228.4 lb

## 2020-09-23 DIAGNOSIS — I442 Atrioventricular block, complete: Secondary | ICD-10-CM | POA: Diagnosis not present

## 2020-09-23 DIAGNOSIS — T82110D Breakdown (mechanical) of cardiac electrode, subsequent encounter: Secondary | ICD-10-CM | POA: Diagnosis not present

## 2020-09-23 DIAGNOSIS — Z950 Presence of cardiac pacemaker: Secondary | ICD-10-CM | POA: Diagnosis not present

## 2020-09-23 NOTE — Patient Instructions (Addendum)
Medication Instructions:  Your physician recommends that you continue on your current medications as directed. Please refer to the Current Medication list given to you today.  Labwork: None ordered.  Testing/Procedures: None ordered.  Follow-Up: Your physician wants you to follow-up in: 6 months with [name], MD or one of the following Advanced Practice Providers on your designated Care Team:    Chanetta Marshall, NP  Tommye Standard, PA-C  Legrand Como "Jonni Sanger" [place], [place]  Remote monitoring is used to monitor your Pacemaker from home. This monitoring reduces the number of office visits required to check your device to one time per year. It allows [place] to keep an eye on the functioning of your device to ensure it is working properly. You are scheduled for a device check from home on 10/02/2020. You may send your transmission at any time that day. If you have a wireless device, the transmission will be sent automatically. After your physician reviews your transmission, you will receive a postcard with your next transmission date.  Any Other Special Instructions Will Be Listed Below (If Applicable).  If you need a refill on your cardiac medications before your next appointment, please call your pharmacy.

## 2020-09-23 NOTE — Progress Notes (Signed)
HPI Miranda Sexton returns today for ongoing PM followup.  She is a pleasant 80 yo woman with CHB, s/p PPM insertion. She has a his bundle lead position and has been found to have increasing thresholds. She underwent PM gen change out and removal of the old RV lead and insertion of a new RV lead in the left bundle area. In the interim, she notes no chest pain, sob, or fever. No difficulty healing. Allergies  Allergen Reactions   Codeine Nausea And Vomiting   Penicillins Rash and Other (See Comments)    Has patient had a PCN reaction causing immediate rash, facial/tongue/throat swelling, SOB or lightheadedness with hypotension: Yes Has patient had a PCN reaction causing severe rash involving mucus membranes or skin necrosis: No Has patient had a PCN reaction that required hospitalization No Has patient had a PCN reaction occurring within the last 10 years: No If all of the above answers are "NO", then may proceed with Cephalosporin use.      Current Outpatient Medications  Medication Sig Dispense Refill   acetaminophen (TYLENOL) 325 MG tablet Take 325 mg by mouth every 6 (six) hours as needed for mild pain or headache.     albuterol (VENTOLIN HFA) 108 (90 Base) MCG/ACT inhaler Inhale 2 puffs into the lungs every 6 (six) hours as needed for wheezing or shortness of breath.      apixaban (ELIQUIS) 5 MG TABS tablet Take 1 tablet (5 mg total) by mouth 2 (two) times daily. Resume Tuesday, 12/21 with morning dose. 60 tablet 5   ascorbic acid (VITAMIN C) 500 MG tablet Take 500 mg by mouth daily.     atorvastatin (LIPITOR) 40 MG tablet Take 40 mg by mouth daily.     benazepril (LOTENSIN) 40 MG tablet Take 40 mg by mouth daily.     flecainide (TAMBOCOR) 50 MG tablet Take 1 tablet (50 mg total) by mouth 2 (two) times daily. 180 tablet 3   furosemide (LASIX) 40 MG tablet Take 80 mg by mouth daily.     glipiZIDE (GLUCOTROL XL) 10 MG 24 hr tablet Take 10 mg by mouth daily.      insulin  aspart protamine- aspart (NOVOLOG 70/30) (70-30) 100 UNIT/ML injection Inject 30-46 Units into the skin See admin instructions. Inject 46 units in the morning & 30 units in the evening.     metoprolol succinate (TOPROL-XL) 25 MG 24 hr tablet TAKE 1/2 TABLET(12.5 MG) BY MOUTH DAILY 45 tablet 2   moxifloxacin (VIGAMOX) 0.5 % ophthalmic solution Place 1 drop into both eyes in the morning and at bedtime.     nitroGLYCERIN (NITROSTAT) 0.4 MG SL tablet Place 1 tablet (0.4 mg total) under the tongue every 5 (five) minutes as needed for chest pain. 30 tablet 2   ONETOUCH VERIO test strip 2 (two) times daily.     potassium chloride SA (K-DUR) 20 MEQ tablet Take 1 tablet (20 mEq total) by mouth daily. 90 tablet 0   TRULICITY 5.42 HC/6.2BJ SOPN Inject 0.75 mg into the skin every Tuesday.     venlafaxine XR (EFFEXOR-XR) 150 MG 24 hr capsule Take 150 mg by mouth daily.     vitamin B-12 (CYANOCOBALAMIN) 50 MCG tablet Take 50 mcg by mouth daily.     No current facility-administered medications for this visit.     Past Medical History:  Diagnosis Date   Anxiety    Asthma    Carotid artery occlusion    Chronic bronchitis ([place])  Chronic diastolic heart failure (HCC)    CKD (chronic kidney disease) stage 2, GFR 60-89 ml/min    COPD (chronic obstructive pulmonary disease) (HCC)    Coronary atherosclerosis of native coronary artery    Nonobstructive 08/2008   Daily headache    DJD (degenerative joint disease)    Essential hypertension    GERD (gastroesophageal reflux disease)    Hyperlipidemia    Obesity    On home oxygen therapy    PAD (peripheral artery disease) (HCC)    Moderate right renal artery stenosis 08/2008   Paroxysmal atrial fibrillation (HCC)    Pneumonia    Presence of permanent cardiac pacemaker    Medtronic Advisa DR MRI SureScan model [id number] PPM 03/03/16   Sick sinus syndrome (HCC)    Medtronic dual-chamber his bundle pacemaker - Dr. Rayann Heman   Type 2  diabetes mellitus ([place])     ROS:   All systems reviewed and negative except as noted in the HPI.   Past Surgical History:  Procedure Laterality Date   BIOPSY  08/03/2017   Procedure: BIOPSY;  Surgeon: [name], MD;  [facility];  Service: Endoscopy;;  gastric    CARDIAC CATHETERIZATION  06/20/2018   CATARACT EXTRACTION W/ INTRAOCULAR LENS  IMPLANT, BILATERAL Bilateral 2008   COLONOSCOPY N/A 08/03/2017   Procedure: COLONOSCOPY;  Surgeon: [name], MD;  [facility];  Service: Endoscopy;  Laterality: N/A;   ENDARTERECTOMY Right 12/08/2012   Procedure: ENDARTERECTOMY CAROTID;  Surgeon: [name], MD;  Location: [facility];  Service: Vascular;  Laterality: Right;   EP IMPLANTABLE DEVICE N/A 03/02/2016   Procedure: Pacemaker Implant;  Surgeon: [name], MD;  Location: [facility];  Service: Cardiovascular;  Laterality: N/A;   ESOPHAGOGASTRODUODENOSCOPY N/A 08/03/2017   Procedure: ESOPHAGOGASTRODUODENOSCOPY (EGD);  Surgeon: [name], MD;  [facility];  Service: Endoscopy;  Laterality: N/A;   ESOPHAGOGASTRODUODENOSCOPY (EGD) WITH PROPOFOL N/A 05/11/2019   Procedure: ESOPHAGOGASTRODUODENOSCOPY (EGD) WITH PROPOFOL;  Surgeon: [name], MD;  [facility];  Service: Endoscopy;  Laterality: N/A;  145pm   INSERT / REPLACE / REMOVE PACEMAKER  03/02/2016   LEFT HEART CATH AND CORONARY ANGIOGRAPHY N/A 06/20/2018   Procedure: LEFT HEART CATH AND CORONARY ANGIOGRAPHY;  Surgeon: [name], MD;  Location: [facility];  Service: Cardiovascular;  Laterality: N/A;   MEDTRONIC PACEMAKER RV LEAD REMOVAL AND PLACMENT GENREATOR CHANGE OUT, (N/A Chest)  06/19/2020   PACEMAKER LEAD REMOVAL N/A 06/19/2020   Procedure: MEDTRONIC PACEMAKER RV LEAD REMOVAL AND PLACMENT [place],;  Surgeon: Evans Lance, MD;  [facility];  Service: Cardiovascular;  Laterality: N/A;   PARTIAL COLECTOMY N/A  10/19/2017   Procedure: PARTIAL COLECTOMY;  Surgeon: [name], MD;  Location: [facility];  Service: General;  Laterality: N/A;   POLYPECTOMY  08/03/2017   Procedure: POLYPECTOMY;  Surgeon: [name], MD;  [facility];  Service: Endoscopy;;  colon     Family History  Problem Relation Age of Onset   Cancer Mother        Stomach   Deep vein thrombosis Mother    Diabetes Mother    Hypertension Mother    Heart disease Mother    Hypertension Father    Diabetes Sister    Hyperlipidemia Sister    Hypertension Sister    Heart disease Sister    Diabetes Brother    Hyperlipidemia Brother    Hypertension  Brother      Social History   Socioeconomic History   Marital status: Married    Spouse name: Not on file   Number of children: Not on file   Years of education: Not on file   Highest education level: Not on file  Occupational History   Not on file  Tobacco Use   Smoking status: Never Smoker   Smokeless tobacco: Never Used  Vaping Use   Vaping Use: Never used  Substance and Sexual Activity   Alcohol use: No    Alcohol/week: 0.0 standard drinks   Drug use: No   Sexual activity: Not Currently    Birth control/protection: None  Other Topics Concern   Not on file  Social History Narrative   Lives in [place] with spouse.  4 grown children.   Retired but continues to clean houses   [place] Strain: Not on file  Food Insecurity: Not on file  Transportation Needs: Not on file  Physical Activity: Not on file  Stress: Not on file  Social Connections: Not on file  Intimate Partner Violence: Not on file     BP 110/68    Pulse 78    Ht 5\' 5"  (1.651 m)    Wt 228 lb 6.4 oz (103.6 kg)    LMP  (LMP Unknown)    SpO2 94%    BMI 38.01 kg/m   Physical Exam:  Well appearing NAD HEENT: Unremarkable Neck:  No JVD, no thyromegally Lymphatics:  No adenopathy Back:  No CVA tenderness Lungs:  Clear  with no wheezes HEART:  Regular rate rhythm, no murmurs, no rubs, no clicks Abd:  soft, positive bowel sounds, no organomegally, no rebound, no guarding Ext:  2 plus pulses, no edema, no cyanosis, no clubbing Skin:  No rashes no nodules Neuro:  CN II through XII intact, motor grossly intact  EKG - atrial fib/flutter with ventricular pacing  DEVICE  Normal device function.  See PaceArt for details.   Assess/Plan: 1. PM lead dysfunction - her threshold is much improved after undergoing extraction and insertion of a new RV lead in the left bundle area. We turned down her outputs. 2. Atrial fib - she remains in atrial fib/flutter. Her rate is controlled. 3. CHB - she has an escape today of 35/min. We will follow. She is asymptomatic with pacing 4. Coags - she has not had bleeding and is back on her systemic anti-coagulation with Eliquis.  Carleene Overlie Emori Mumme,MD

## 2020-09-29 ENCOUNTER — Other Ambulatory Visit: Payer: Self-pay | Admitting: Internal Medicine

## 2020-09-29 LAB — CUP PACEART INCLINIC DEVICE CHECK
Battery Remaining Longevity: 150 mo
Battery Voltage: 3.21 V
Brady Statistic AP VP Percent: 0 %
Brady Statistic AP VS Percent: 0 %
Brady Statistic AS VP Percent: 99.7 %
Brady Statistic AS VS Percent: 0.3 %
Brady Statistic RA Percent Paced: 0 %
Brady Statistic RV Percent Paced: 99.7 %
Date Time Interrogation Session: 20220323094303
Implantable Lead Implant Date: 20170829
Implantable Lead Location: 753859
Implantable Lead Model: 5076
Implantable Pulse Generator Implant Date: 20170829
Lead Channel Impedance Value: 285 Ohm
Lead Channel Impedance Value: 380 Ohm
Lead Channel Impedance Value: 437 Ohm
Lead Channel Impedance Value: 684 Ohm
Lead Channel Pacing Threshold Amplitude: 1 V
Lead Channel Pacing Threshold Pulse Width: 0.4 ms
Lead Channel Sensing Intrinsic Amplitude: 16.875 mV
Lead Channel Setting Pacing Amplitude: 2.25 V
Lead Channel Setting Pacing Pulse Width: 0.4 ms
Lead Channel Setting Sensing Sensitivity: 1.2 mV

## 2020-10-02 DIAGNOSIS — E1165 Type 2 diabetes mellitus with hyperglycemia: Secondary | ICD-10-CM | POA: Diagnosis not present

## 2020-10-15 DIAGNOSIS — I10 Essential (primary) hypertension: Secondary | ICD-10-CM | POA: Diagnosis not present

## 2020-10-15 DIAGNOSIS — N183 Chronic kidney disease, stage 3 unspecified: Secondary | ICD-10-CM | POA: Diagnosis not present

## 2020-10-15 DIAGNOSIS — E1122 Type 2 diabetes mellitus with diabetic chronic kidney disease: Secondary | ICD-10-CM | POA: Diagnosis not present

## 2020-10-15 DIAGNOSIS — Z299 Encounter for prophylactic measures, unspecified: Secondary | ICD-10-CM | POA: Diagnosis not present

## 2020-10-15 DIAGNOSIS — H6690 Otitis media, unspecified, unspecified ear: Secondary | ICD-10-CM | POA: Diagnosis not present

## 2020-10-15 DIAGNOSIS — E1165 Type 2 diabetes mellitus with hyperglycemia: Secondary | ICD-10-CM | POA: Diagnosis not present

## 2020-10-31 DIAGNOSIS — E1165 Type 2 diabetes mellitus with hyperglycemia: Secondary | ICD-10-CM | POA: Diagnosis not present

## 2020-12-02 DIAGNOSIS — E1165 Type 2 diabetes mellitus with hyperglycemia: Secondary | ICD-10-CM | POA: Diagnosis not present

## 2021-01-01 DIAGNOSIS — M542 Cervicalgia: Secondary | ICD-10-CM | POA: Diagnosis not present

## 2021-01-01 DIAGNOSIS — I10 Essential (primary) hypertension: Secondary | ICD-10-CM | POA: Diagnosis not present

## 2021-01-01 DIAGNOSIS — Z299 Encounter for prophylactic measures, unspecified: Secondary | ICD-10-CM | POA: Diagnosis not present

## 2021-01-01 DIAGNOSIS — E1165 Type 2 diabetes mellitus with hyperglycemia: Secondary | ICD-10-CM | POA: Diagnosis not present

## 2021-01-01 DIAGNOSIS — I509 Heart failure, unspecified: Secondary | ICD-10-CM | POA: Diagnosis not present

## 2021-01-01 DIAGNOSIS — K219 Gastro-esophageal reflux disease without esophagitis: Secondary | ICD-10-CM | POA: Diagnosis not present

## 2021-01-01 DIAGNOSIS — E1122 Type 2 diabetes mellitus with diabetic chronic kidney disease: Secondary | ICD-10-CM | POA: Diagnosis not present

## 2021-01-05 ENCOUNTER — Emergency Department (HOSPITAL_COMMUNITY): Payer: Medicare HMO

## 2021-01-05 ENCOUNTER — Emergency Department (HOSPITAL_COMMUNITY)
Admission: EM | Admit: 2021-01-05 | Discharge: 2021-01-05 | Disposition: A | Discharge status: Home / Self Care | Payer: Medicare HMO | Attending: Emergency Medicine | Admitting: Emergency Medicine

## 2021-01-05 ENCOUNTER — Encounter (HOSPITAL_COMMUNITY): Payer: Self-pay | Admitting: *Deleted

## 2021-01-05 ENCOUNTER — Other Ambulatory Visit: Payer: Self-pay

## 2021-01-05 DIAGNOSIS — J449 Chronic obstructive pulmonary disease, unspecified: Secondary | ICD-10-CM | POA: Insufficient documentation

## 2021-01-05 DIAGNOSIS — I130 Hypertensive heart and chronic kidney disease with heart failure and stage 1 through stage 4 chronic kidney disease, or unspecified chronic kidney disease: Secondary | ICD-10-CM | POA: Diagnosis not present

## 2021-01-05 DIAGNOSIS — Z950 Presence of cardiac pacemaker: Secondary | ICD-10-CM | POA: Diagnosis not present

## 2021-01-05 DIAGNOSIS — R42 Dizziness and giddiness: Secondary | ICD-10-CM | POA: Insufficient documentation

## 2021-01-05 DIAGNOSIS — Z7984 Long term (current) use of oral hypoglycemic drugs: Secondary | ICD-10-CM | POA: Insufficient documentation

## 2021-01-05 DIAGNOSIS — I2510 Atherosclerotic heart disease of native coronary artery without angina pectoris: Secondary | ICD-10-CM | POA: Insufficient documentation

## 2021-01-05 DIAGNOSIS — Z794 Long term (current) use of insulin: Secondary | ICD-10-CM | POA: Insufficient documentation

## 2021-01-05 DIAGNOSIS — J45909 Unspecified asthma, uncomplicated: Secondary | ICD-10-CM | POA: Diagnosis not present

## 2021-01-05 DIAGNOSIS — Z79899 Other long term (current) drug therapy: Secondary | ICD-10-CM | POA: Insufficient documentation

## 2021-01-05 DIAGNOSIS — E1122 Type 2 diabetes mellitus with diabetic chronic kidney disease: Secondary | ICD-10-CM | POA: Diagnosis not present

## 2021-01-05 DIAGNOSIS — N182 Chronic kidney disease, stage 2 (mild): Secondary | ICD-10-CM | POA: Diagnosis not present

## 2021-01-05 DIAGNOSIS — Z7901 Long term (current) use of anticoagulants: Secondary | ICD-10-CM | POA: Diagnosis not present

## 2021-01-05 DIAGNOSIS — M47812 Spondylosis without myelopathy or radiculopathy, cervical region: Secondary | ICD-10-CM | POA: Diagnosis not present

## 2021-01-05 DIAGNOSIS — I5032 Chronic diastolic (congestive) heart failure: Secondary | ICD-10-CM | POA: Insufficient documentation

## 2021-01-05 DIAGNOSIS — M79602 Pain in left arm: Secondary | ICD-10-CM | POA: Insufficient documentation

## 2021-01-05 DIAGNOSIS — I6522 Occlusion and stenosis of left carotid artery: Secondary | ICD-10-CM | POA: Diagnosis not present

## 2021-01-05 DIAGNOSIS — R519 Headache, unspecified: Secondary | ICD-10-CM | POA: Diagnosis not present

## 2021-01-05 LAB — COMPREHENSIVE METABOLIC PANEL
ALT: 24 U/L (ref 0–44)
AST: 29 U/L (ref 15–41)
Albumin: 3.5 g/dL (ref 3.5–5.0)
Alkaline Phosphatase: 71 U/L (ref 38–126)
Anion gap: 10 (ref 5–15)
BUN: 20 mg/dL (ref 8–23)
CO2: 30 mmol/L (ref 22–32)
Calcium: 9.1 mg/dL (ref 8.9–10.3)
Chloride: 102 mmol/L (ref 98–111)
Creatinine, Ser: 1.15 mg/dL — ABNORMAL HIGH (ref 0.44–1.00)
GFR, Estimated: 48 mL/min — ABNORMAL LOW (ref >60)
Glucose, Bld: 66 mg/dL — ABNORMAL LOW (ref 70–99)
Potassium: 4.8 mmol/L (ref 3.5–5.1)
Sodium: 142 mmol/L (ref 135–145)
Total Bilirubin: 0.4 mg/dL (ref 0.3–1.2)
Total Protein: 7.3 g/dL (ref 6.5–8.1)

## 2021-01-05 LAB — CBC WITH DIFFERENTIAL/PLATELET
Abs Immature Granulocytes: 0.02 10*3/uL (ref 0.00–0.07)
Basophils Absolute: 0 10*3/uL (ref 0.0–0.1)
Basophils Relative: 1 %
Eosinophils Absolute: 0.3 10*3/uL (ref 0.0–0.5)
Eosinophils Relative: 6 %
HCT: 41.9 % (ref 36.0–46.0)
Hemoglobin: 13 g/dL (ref 12.0–15.0)
Immature Granulocytes: 0 %
Lymphocytes Relative: 32 %
Lymphs Abs: 1.7 10*3/uL (ref 0.7–4.0)
MCH: 30 pg (ref 26.0–34.0)
MCHC: 31 g/dL (ref 30.0–36.0)
MCV: 96.5 fL (ref 80.0–100.0)
Monocytes Absolute: 0.7 10*3/uL (ref 0.1–1.0)
Monocytes Relative: 12 %
Neutro Abs: 2.6 10*3/uL (ref 1.7–7.7)
Neutrophils Relative %: 49 %
Platelets: 254 10*3/uL (ref 150–400)
RBC: 4.34 MIL/uL (ref 3.87–5.11)
RDW: 15 % (ref 11.5–15.5)
WBC: 5.4 10*3/uL (ref 4.0–10.5)
nRBC: 0 % (ref 0.0–0.2)

## 2021-01-05 LAB — TROPONIN I (HIGH SENSITIVITY)
Troponin I (High Sensitivity): 12 ng/L (ref <18)
Troponin I (High Sensitivity): 11 ng/L (ref <18)

## 2021-01-05 MED ORDER — SODIUM CHLORIDE 0.9 % IV BOLUS
1000.0000 mL | Freq: Once | INTRAVENOUS | Status: AC
  Administered 2021-01-05: 1000 mL via INTRAVENOUS

## 2021-01-05 MED ORDER — SODIUM CHLORIDE 0.9 % IV BOLUS: 1000.0000 mL | Freq: Once | INTRAVENOUS | Status: DC

## 2021-01-05 NOTE — ED Provider Notes (Signed)
[facility] EMERGENCY DEPARTMENT Provider Note   CSN: [id number] Arrival date & time: 01/05/21  1705     History Chief Complaint  Patient presents with  . Dizziness    Miranda Sexton is a 80 y.o. female.  Patient states that she has been having some dizziness since she started taking the baclofen and prednisone for her left arm pain  The history is provided by the patient and medical records. No language interpreter was used.  Dizziness Quality:  Lightheadedness Severity:  Mild Onset quality:  Sudden Duration: 1 day. Timing:  Intermittent Progression:  Improving Chronicity:  New Context: not when bending over   Relieved by:  Nothing Worsened by:  Nothing Associated symptoms: no chest pain, no diarrhea and no headaches       Past Medical History:  Diagnosis Date  . Anxiety   . Asthma   . Carotid artery occlusion   . Chronic bronchitis ([place])   . Chronic diastolic heart failure ([place])   . CKD (chronic kidney disease) stage 2, GFR 60-89 ml/min   . COPD (chronic obstructive pulmonary disease) ([place])   . Coronary atherosclerosis of native coronary artery    Nonobstructive 08/2008  . Daily headache   . DJD (degenerative joint disease)   . Essential hypertension   . GERD (gastroesophageal reflux disease)   . Hyperlipidemia   . Obesity   . On home oxygen therapy   . PAD (peripheral artery disease) (HCC)    Moderate right renal artery stenosis 08/2008  . Paroxysmal atrial fibrillation (HCC)   . Pneumonia   . Presence of permanent cardiac pacemaker    Medtronic Advisa DR MRI SureScan model [id number] PPM 03/03/16  . Sick sinus syndrome (HCC)    Medtronic dual-chamber his bundle pacemaker - Dr. Rayann Heman  . Type 2 diabetes mellitus [facility])     Patient Active Problem List   Diagnosis Date Noted  . Complete heart block ([place]) 06/19/2020  . Pacemaker lead failure 04/25/2020  . Abscess of abdominal wall 03/21/2020  . Heme + stool 03/28/2019  . Unstable angina ([place]) 06/17/2018   . S/P partial colectomy 10/19/2017  . Dysplastic colon polyp   . Absolute anemia 06/23/2017  . Pacemaker   . Sick sinus syndrome ([place]) 03/02/2016  . Secondary pulmonary hypertension 12/20/2013  . Dysphagia 03/14/2013  . Occlusion and stenosis of carotid artery without mention of cerebral infarction 12/05/2012  . Exertional dyspnea 11/09/2012  . Essential hypertension, benign 10/26/2012  . Chronic diastolic heart failure ([place]) 10/26/2012  . Mixed hyperlipidemia 09/10/2008  . CORONARY ATHEROSCLEROSIS NATIVE CORONARY ARTERY 09/10/2008    Past Surgical History:  Procedure Laterality Date  . BIOPSY  08/03/2017   Procedure: BIOPSY;  Surgeon: [name], MD;  [facility];  Service: Endoscopy;;  gastric   . CARDIAC CATHETERIZATION  06/20/2018  . CATARACT EXTRACTION W/ INTRAOCULAR LENS  IMPLANT, BILATERAL Bilateral 2008  . COLONOSCOPY N/A 08/03/2017   Procedure: COLONOSCOPY;  Surgeon: [name], MD;  [facility];  Service: Endoscopy;  Laterality: N/A;  . ENDARTERECTOMY Right 12/08/2012   Procedure: ENDARTERECTOMY CAROTID;  Surgeon: [name], MD;  Location: [place];  Service: Vascular;  Laterality: Right;  . EP IMPLANTABLE DEVICE N/A 03/02/2016   Procedure: Pacemaker Implant;  Surgeon: [name], MD;  Location: [place] CV LAB;  Service: Cardiovascular;  Laterality: N/A;  . ESOPHAGOGASTRODUODENOSCOPY N/A 08/03/2017   Procedure: ESOPHAGOGASTRODUODENOSCOPY (EGD);  Surgeon: [name], MD;  [facility];  Service: Endoscopy;  Laterality: N/A;  . ESOPHAGOGASTRODUODENOSCOPY (EGD) WITH PROPOFOL N/A 05/11/2019   Procedure: ESOPHAGOGASTRODUODENOSCOPY (EGD) WITH PROPOFOL;  Surgeon: [name], MD;  [facility];  Service: Endoscopy;  Laterality: N/A;  145pm  . INSERT / REPLACE / REMOVE PACEMAKER  03/02/2016  . LEFT HEART CATH AND CORONARY ANGIOGRAPHY N/A 06/20/2018   Procedure: LEFT HEART CATH AND CORONARY ANGIOGRAPHY;  Surgeon:  [name], MD;  Location: [place] CV LAB;  Service: Cardiovascular;  Laterality: N/A;  . MEDTRONIC PACEMAKER RV LEAD REMOVAL AND PLACMENT GENREATOR CHANGE OUT, (N/A Chest)  06/19/2020  . PACEMAKER LEAD REMOVAL N/A 06/19/2020   Procedure: MEDTRONIC PACEMAKER RV LEAD REMOVAL AND [place],;  Surgeon: Evans Lance, MD;  Location: [place];  Service: Cardiovascular;  Laterality: N/A;  . PARTIAL COLECTOMY N/A 10/19/2017   Procedure: PARTIAL COLECTOMY;  Surgeon: [name], MD;  Location: [facility];  Service: General;  Laterality: N/A;  . POLYPECTOMY  08/03/2017   Procedure: POLYPECTOMY;  Surgeon: [name], MD;  [facility];  Service: Endoscopy;;  colon     OB History   No obstetric history on file.     Family History  Problem Relation Age of Onset  . Cancer Mother        Stomach  . Deep vein thrombosis Mother   . Diabetes Mother   . Hypertension Mother   . Heart disease Mother   . Hypertension Father   . Diabetes Sister   . Hyperlipidemia Sister   . Hypertension Sister   . Heart disease Sister   . Diabetes Brother   . Hyperlipidemia Brother   . Hypertension Brother     Social History   Tobacco Use  . Smoking status: Never  . Smokeless tobacco: Never  Vaping Use  . Vaping Use: Never used  Substance Use Topics  . Alcohol use: No    Alcohol/week: 0.0 standard drinks  . Drug use: No    Home Medications Prior to Admission medications   Medication Sig Start Date End Date Taking? Authorizing Provider  acetaminophen (TYLENOL) 325 MG tablet Take 325 mg by mouth every 6 (six) hours as needed for mild pain or headache.   Yes [provider]  albuterol (VENTOLIN HFA) 108 (90 Base) MCG/ACT inhaler Inhale 2 puffs into the lungs every 6 (six) hours as needed for wheezing or shortness of breath.    Yes [provider]  apixaban (ELIQUIS) 5 MG TABS tablet Take 1 tablet (5 mg total) by mouth 2 (two) times daily. Resume  Tuesday, 12/21 with morning dose. 06/24/20  Yes Shirley Friar, PA-C  ascorbic acid (VITAMIN C) 500 MG tablet Take 500 mg by mouth daily.   Yes [provider]  atorvastatin (LIPITOR) 40 MG tablet Take 40 mg by mouth daily.   Yes [provider]  baclofen (LIORESAL) 10 MG tablet Take 10 mg by mouth 2 (two) times daily. 01/01/21  Yes [provider]  benazepril (LOTENSIN) 40 MG tablet Take 40 mg by mouth daily.   Yes [provider]  flecainide (TAMBOCOR) 50 MG tablet Take 1 tablet (50 mg total) by mouth 2 (two) times daily. 07/15/20  Yes Allred, Jeneen Rinks, MD  furosemide (LASIX) 40 MG tablet Take 80 mg by mouth daily.   Yes [provider]  glipiZIDE (GLUCOTROL XL) 10 MG 24 hr tablet Take 10 mg by mouth daily.    Yes [provider]  insulin aspart protamine-  aspart (NOVOLOG 70/30) (70-30) 100 UNIT/ML injection Inject 30-46 Units into the skin See admin instructions. Inject 46 units in the morning & 30 units in the evening.   Yes [provider]  metoprolol succinate (TOPROL-XL) 25 MG 24 hr tablet TAKE 1/2 TABLET(12.5 MG) BY MOUTH DAILY Patient taking differently: Take 12.5 mg by mouth daily. 08/22/20  Yes Satira Sark, MD  nitroGLYCERIN (NITROSTAT) 0.4 MG SL tablet Place 1 tablet (0.4 mg total) under the tongue every 5 (five) minutes as needed for chest pain. 06/21/18  Yes Hosie Poisson, MD  potassium chloride SA (K-DUR) 20 MEQ tablet Take 1 tablet (20 mEq total) by mouth daily. 02/05/19  Yes Satira Sark, MD  predniSONE (DELTASONE) 10 MG tablet Take 10 mg by mouth daily. 01/01/21  Yes [provider]  TRULICITY 1.61 WR/6.0AV SOPN Inject 0.75 mg into the skin every Tuesday. 01/21/20  Yes [provider]  venlafaxine XR (EFFEXOR-XR) 150 MG 24 hr capsule Take 150 mg by mouth daily.   Yes [provider]  vitamin B-12 (CYANOCOBALAMIN) 50 MCG tablet Take 50 mcg by mouth daily.   Yes [provider]  levofloxacin (LEVAQUIN) 500 MG tablet Take 500 mg by mouth daily. Patient not taking: No sig reported 10/15/20   [provider]  moxifloxacin (VIGAMOX) 0.5 % ophthalmic solution Place 1 drop into both eyes in the morning and at bedtime. Patient not taking: Reported on 01/05/2021    [provider]  [facility] VERIO test strip 2 (two) times daily. 03/03/20   [provider]    Allergies    Codeine and Penicillins  Review of Systems   Review of Systems  Constitutional:  Negative for appetite change and fatigue.  HENT:  Negative for congestion, ear discharge and sinus pressure.   Eyes:  Negative for discharge.  Respiratory:  Negative for cough.   Cardiovascular:  Negative for chest pain.  Gastrointestinal:  Negative for abdominal pain and diarrhea.  Genitourinary:  Negative for frequency and hematuria.  Musculoskeletal:  Negative for back pain.  Skin:  Negative for rash.  Neurological:  Positive for dizziness. Negative for seizures and headaches.  Psychiatric/Behavioral:  Negative for hallucinations.    Physical Exam Updated Vital Signs BP (!) 131/59   Pulse 69   Temp 97.9 F (36.6 C) (Oral)   Resp 17   LMP  (LMP Unknown)   SpO2 100%   Physical Exam Vitals and nursing note reviewed.  Constitutional:      Appearance: She is well-developed.  HENT:     Head: Normocephalic.  Eyes:     General: No scleral icterus.    Conjunctiva/sclera: Conjunctivae normal.  Neck:     Thyroid: No thyromegaly.  Cardiovascular:     Rate and Rhythm: Normal rate and regular rhythm.     Heart sounds: No murmur heard.   No friction rub. No gallop.  Pulmonary:     Breath sounds: No stridor. No wheezing or rales.  Chest:     Chest wall: No tenderness.  Abdominal:     General: There is no distension.     Tenderness: There is no abdominal tenderness. There is no rebound.  Musculoskeletal:        General: Normal range of motion.     Cervical back: Neck supple.   Lymphadenopathy:     Cervical: No cervical adenopathy.  Skin:    Findings: No erythema or rash.  Neurological:     Mental Status: She is alert and  oriented to person, place, and time.     Motor: No abnormal muscle tone.     Coordination: Coordination normal.  Psychiatric:        Behavior: Behavior normal.    ED Results / Procedures / Treatments   Labs (all labs ordered are listed, but only abnormal results are displayed) Labs Reviewed  COMPREHENSIVE METABOLIC PANEL - Abnormal; Notable for the following components:      Result Value   Glucose, Bld 66 (*)    Creatinine, Ser 1.15 (*)    GFR, Estimated 48 (*)    All other components within normal limits  CBC WITH DIFFERENTIAL/PLATELET  TROPONIN I (HIGH SENSITIVITY)  TROPONIN I (HIGH SENSITIVITY)    EKG None  Radiology CT Head Wo Contrast  Result Date: 01/05/2021 CLINICAL DATA:  Cervical radiculopathy, infection suspected. Pt c/o left sided headache with dizziness and pain radiating down left side of neck and left arm x 3 days NKI EXAM: CT HEAD WITHOUT CONTRAST CT CERVICAL SPINE WITHOUT CONTRAST TECHNIQUE: Multidetector CT imaging of the head and cervical spine was performed following the standard protocol without intravenous contrast. Multiplanar CT image reconstructions of the cervical spine were also generated. COMPARISON:  None. FINDINGS: CT HEAD FINDINGS Brain: Patchy and confluent areas of decreased attenuation are noted throughout the deep and periventricular white matter of the cerebral hemispheres bilaterally, compatible with chronic microvascular ischemic disease. No evidence of large-territorial acute infarction. No parenchymal hemorrhage. No mass lesion. No extra-axial collection. No mass effect or midline shift. No hydrocephalus. Basilar cisterns are patent. Vascular: No hyperdense vessel. Skull: No acute fracture or focal lesion. Sinuses/Orbits: Paranasal sinuses and mastoid air cells are clear. The orbits are unremarkable.  Other: None. CT CERVICAL SPINE FINDINGS Alignment: Normal. Skull base and vertebrae: Multilevel moderate degenerative changes of the spine. No cortical erosion or destruction. No associated severe osseous neural foraminal or central canal stenosis. No acute fracture. No aggressive appearing focal osseous lesion. No aggressive appearing focal pathologic process. Soft tissues and spinal canal: No prevertebral fluid or swelling. No visible canal hematoma. Upper chest: Unremarkable. Other: Suggestion of severe calcified and noncalcified atherosclerotic plaque of the left carotid artery within the neck. IMPRESSION: 1. Suggestion of severe left carotid artery narrowing in the setting of severe calcified and noncalcified atherosclerotic plaque in the neck. Recommend ultrasound of the carotid arteries. 2. No acute intracranial abnormality. 3. No acute displaced fracture or traumatic listhesis of the cervical spine. Electronically Signed   By: Iven Finn M.D.   On: 01/05/2021 18:36   CT Cervical Spine Wo Contrast  Result Date: 01/05/2021 CLINICAL DATA:  Cervical radiculopathy, infection suspected. Pt c/o left sided headache with dizziness and pain radiating down left side of neck and left arm x 3 days NKI EXAM: CT HEAD WITHOUT CONTRAST CT CERVICAL SPINE WITHOUT CONTRAST TECHNIQUE: Multidetector CT imaging of the head and cervical spine was performed following the standard protocol without intravenous contrast. Multiplanar CT image reconstructions of the cervical spine were also generated. COMPARISON:  None. FINDINGS: CT HEAD FINDINGS Brain: Patchy and confluent areas of decreased attenuation are noted throughout the deep and periventricular white matter of the cerebral hemispheres bilaterally, compatible with chronic microvascular ischemic disease. No evidence of large-territorial acute infarction. No parenchymal hemorrhage. No mass lesion. No extra-axial collection. No mass effect or midline shift. No hydrocephalus.  Basilar cisterns are patent. Vascular: No hyperdense vessel. Skull: No acute fracture or focal lesion. Sinuses/Orbits: Paranasal sinuses and mastoid air cells are clear. The orbits are unremarkable.  Other: None. CT CERVICAL SPINE FINDINGS Alignment: Normal. Skull base and vertebrae: Multilevel moderate degenerative changes of the spine. No cortical erosion or destruction. No associated severe osseous neural foraminal or central canal stenosis. No acute fracture. No aggressive appearing focal osseous lesion. No aggressive appearing focal pathologic process. Soft tissues and spinal canal: No prevertebral fluid or swelling. No visible canal hematoma. Upper chest: Unremarkable. Other: Suggestion of severe calcified and noncalcified atherosclerotic plaque of the left carotid artery within the neck. IMPRESSION: 1. Suggestion of severe left carotid artery narrowing in the setting of severe calcified and noncalcified atherosclerotic plaque in the neck. Recommend ultrasound of the carotid arteries. 2. No acute intracranial abnormality. 3. No acute displaced fracture or traumatic listhesis of the cervical spine. Electronically Signed   By: Iven Finn M.D.   On: 01/05/2021 18:36    Procedures Procedures   Medications Ordered in ED Medications  sodium chloride 0.9 % bolus 1,000 mL (1,000 mLs Intravenous Bolus 01/05/21 1806)    ED Course  I have reviewed the triage vital signs and the nursing notes.  Pertinent labs & imaging results that were available during my care of the patient were reviewed by me and considered in my medical decision making (see chart for details).    MDM Rules/Calculators/A&P                          Labs unremarkable except for mildly low glucose.  CT scan shows some calcifications possibly in her carotid arteries.  I think the dizziness is related to the baclofen so she will stop taking that.  She will follow-up with her PCP to get carotid ultrasounds done Final Clinical  Impression(s) / ED Diagnoses Final diagnoses:  Dizziness    Rx / DC Orders ED Discharge Orders     None        Milton Ferguson, MD 01/06/21 1001

## 2021-01-05 NOTE — ED Notes (Signed)
Pt back from CT

## 2021-01-05 NOTE — Discharge Instructions (Addendum)
Stop taking the muscle relaxer baclofen.  I think this is what is making you dizzy.  Follow-up with your family doctor this week to get set up for the carotid ultrasound

## 2021-01-05 NOTE — ED Notes (Signed)
PT to CT at this time.

## 2021-01-05 NOTE — ED Triage Notes (Signed)
Dizzy for 3 days, pain in left ear and left sides headache

## 2021-01-07 DIAGNOSIS — L811 Chloasma: Secondary | ICD-10-CM | POA: Diagnosis not present

## 2021-01-13 DIAGNOSIS — R0989 Other specified symptoms and signs involving the circulatory and respiratory systems: Secondary | ICD-10-CM | POA: Diagnosis not present

## 2021-01-13 DIAGNOSIS — I10 Essential (primary) hypertension: Secondary | ICD-10-CM | POA: Diagnosis not present

## 2021-01-13 DIAGNOSIS — I4891 Unspecified atrial fibrillation: Secondary | ICD-10-CM | POA: Diagnosis not present

## 2021-01-13 DIAGNOSIS — I5030 Unspecified diastolic (congestive) heart failure: Secondary | ICD-10-CM | POA: Diagnosis not present

## 2021-01-13 DIAGNOSIS — Z299 Encounter for prophylactic measures, unspecified: Secondary | ICD-10-CM | POA: Diagnosis not present

## 2021-01-16 ENCOUNTER — Other Ambulatory Visit: Payer: Self-pay | Admitting: Internal Medicine

## 2021-01-16 DIAGNOSIS — R0989 Other specified symptoms and signs involving the circulatory and respiratory systems: Secondary | ICD-10-CM

## 2021-01-21 ENCOUNTER — Ambulatory Visit (HOSPITAL_COMMUNITY)
Admission: RE | Admit: 2021-01-21 | Discharge: 2021-01-21 | Disposition: A | Discharge status: Home / Self Care | Payer: Medicare HMO | Source: Ambulatory Visit | Attending: Internal Medicine | Admitting: Internal Medicine

## 2021-01-21 ENCOUNTER — Other Ambulatory Visit: Payer: Self-pay

## 2021-01-21 DIAGNOSIS — R0989 Other specified symptoms and signs involving the circulatory and respiratory systems: Secondary | ICD-10-CM | POA: Insufficient documentation

## 2021-01-21 DIAGNOSIS — I6523 Occlusion and stenosis of bilateral carotid arteries: Secondary | ICD-10-CM | POA: Diagnosis not present

## 2021-01-28 DIAGNOSIS — E1122 Type 2 diabetes mellitus with diabetic chronic kidney disease: Secondary | ICD-10-CM | POA: Diagnosis not present

## 2021-01-28 DIAGNOSIS — I10 Essential (primary) hypertension: Secondary | ICD-10-CM | POA: Diagnosis not present

## 2021-01-28 DIAGNOSIS — Z299 Encounter for prophylactic measures, unspecified: Secondary | ICD-10-CM | POA: Diagnosis not present

## 2021-01-28 DIAGNOSIS — E1165 Type 2 diabetes mellitus with hyperglycemia: Secondary | ICD-10-CM | POA: Diagnosis not present

## 2021-01-28 DIAGNOSIS — I509 Heart failure, unspecified: Secondary | ICD-10-CM | POA: Diagnosis not present

## 2021-01-28 DIAGNOSIS — I6529 Occlusion and stenosis of unspecified carotid artery: Secondary | ICD-10-CM | POA: Diagnosis not present

## 2021-01-30 DIAGNOSIS — E1165 Type 2 diabetes mellitus with hyperglycemia: Secondary | ICD-10-CM | POA: Diagnosis not present

## 2021-02-01 DIAGNOSIS — E1165 Type 2 diabetes mellitus with hyperglycemia: Secondary | ICD-10-CM | POA: Diagnosis not present

## 2021-03-02 DIAGNOSIS — Z1331 Encounter for screening for depression: Secondary | ICD-10-CM | POA: Diagnosis not present

## 2021-03-02 DIAGNOSIS — R69 Illness, unspecified: Secondary | ICD-10-CM | POA: Diagnosis not present

## 2021-03-02 DIAGNOSIS — Z6838 Body mass index (BMI) 38.0-38.9, adult: Secondary | ICD-10-CM | POA: Diagnosis not present

## 2021-03-02 DIAGNOSIS — Z7189 Other specified counseling: Secondary | ICD-10-CM | POA: Diagnosis not present

## 2021-03-02 DIAGNOSIS — Z79899 Other long term (current) drug therapy: Secondary | ICD-10-CM | POA: Diagnosis not present

## 2021-03-02 DIAGNOSIS — Z1339 Encounter for screening examination for other mental health and behavioral disorders: Secondary | ICD-10-CM | POA: Diagnosis not present

## 2021-03-02 DIAGNOSIS — I700 Atherosclerosis of aorta: Secondary | ICD-10-CM | POA: Diagnosis not present

## 2021-03-02 DIAGNOSIS — Z299 Encounter for prophylactic measures, unspecified: Secondary | ICD-10-CM | POA: Diagnosis not present

## 2021-03-02 DIAGNOSIS — I10 Essential (primary) hypertension: Secondary | ICD-10-CM | POA: Diagnosis not present

## 2021-03-02 DIAGNOSIS — Z0000 Encounter for general adult medical examination without abnormal findings: Secondary | ICD-10-CM | POA: Diagnosis not present

## 2021-03-02 DIAGNOSIS — I5030 Unspecified diastolic (congestive) heart failure: Secondary | ICD-10-CM | POA: Diagnosis not present

## 2021-03-04 ENCOUNTER — Encounter: Payer: Medicare HMO | Admitting: Vascular Surgery

## 2021-03-04 DIAGNOSIS — E1165 Type 2 diabetes mellitus with hyperglycemia: Secondary | ICD-10-CM | POA: Diagnosis not present

## 2021-03-18 ENCOUNTER — Ambulatory Visit (INDEPENDENT_AMBULATORY_CARE_PROVIDER_SITE_OTHER): Payer: Medicare HMO | Admitting: Vascular Surgery

## 2021-03-18 ENCOUNTER — Other Ambulatory Visit: Payer: Self-pay

## 2021-03-18 ENCOUNTER — Encounter: Payer: Self-pay | Admitting: Vascular Surgery

## 2021-03-18 VITALS — BP 139/80 | HR 70 | Temp 97.9°F | Ht 65.0 in | Wt 230.0 lb

## 2021-03-18 DIAGNOSIS — I6522 Occlusion and stenosis of left carotid artery: Secondary | ICD-10-CM | POA: Diagnosis not present

## 2021-03-18 NOTE — Progress Notes (Signed)
[facility] [place]  Patient name: Miranda Sexton MRN: [id number] DOB: [birth year] Sex: female  REASON FOR CONSULT: Evaluation asymptomatic carotid disease  HPI: Miranda Sexton is a 80 y.o. female, who is here today for discussion of asymptomatic carotid disease.  She is here today with her daughter.  She is known to me from prior right carotid endarterectomy which was uneventful and 2014.  She had a recent carotid duplex at [facility] and I have this for review.  She was found to have a greater than 70% left internal carotid artery stenosis.  Her systolic velocity was [id number] cm/s and end-diastolic velocity was 73 cm/s.  She is left-handed.  She denies any symptoms of amaurosis fugax, aphasia or stroke.  She is active.  Past Medical History:  Diagnosis Date   Anxiety    Asthma    Carotid artery occlusion    Chronic bronchitis (HCC)    Chronic diastolic heart failure (HCC)    CKD (chronic kidney disease) stage 2, GFR 60-89 ml/min    COPD (chronic obstructive pulmonary disease) (HCC)    Coronary atherosclerosis of native coronary artery    Nonobstructive 08/2008   Daily headache    DJD (degenerative joint disease)    Essential hypertension    GERD (gastroesophageal reflux disease)    Hyperlipidemia    Obesity    On home oxygen therapy    PAD (peripheral artery disease) (HCC)    Moderate right renal artery stenosis 08/2008   Paroxysmal atrial fibrillation (HCC)    Pneumonia    Presence of permanent cardiac pacemaker    Medtronic Advisa DR MRI SureScan model [id number] PPM 03/03/16   Sick sinus syndrome (HCC)    Medtronic dual-chamber his bundle pacemaker - Dr. Rayann Sexton   Type 2 diabetes mellitus ([place])     Family History  Problem Relation Age of Onset   Cancer Mother        Stomach   Deep vein thrombosis Mother    Diabetes Mother    Hypertension Mother    Heart disease Mother    Hypertension Father    Diabetes Sister     Hyperlipidemia Sister    Hypertension Sister    Heart disease Sister    Diabetes Brother    Hyperlipidemia Brother    Hypertension Brother     SOCIAL HISTORY: Social History   Socioeconomic History   Marital status: Married    Spouse name: Not on file   Number of children: Not on file   Years of education: Not on file   Highest education level: Not on file  Occupational History   Not on file  Tobacco Use   Smoking status: Never   Smokeless tobacco: Never  Vaping Use   Vaping Use: Never used  Substance and Sexual Activity   Alcohol use: No    Alcohol/week: 0.0 standard drinks   Drug use: No   Sexual activity: Not Currently    Birth control/protection: None  Other Topics Concern   Not on file  Social History Narrative   Lives in [place] with spouse.  4 grown children.   Retired but continues to clean houses   Social Determinants of Radio broadcast assistant Strain: Not on [employer] Insecurity: Not on file  Transportation Needs: Not on file  Physical Activity: Not on file  Stress: Not on file  Social Connections: Not on file  Intimate Partner Violence: Not on file    Allergies  Allergen Reactions   Codeine Nausea And Vomiting   Penicillins Rash and Other (See Comments)    Has patient had a PCN reaction causing immediate rash, facial/tongue/throat swelling, SOB or lightheadedness with hypotension: Yes Has patient had a PCN reaction causing severe rash involving mucus membranes or skin necrosis: No Has patient had a PCN reaction that required hospitalization No Has patient had a PCN reaction occurring within the last 10 years: No If all of the above answers are "NO", then may proceed with Cephalosporin use.     Current Outpatient Medications  Medication Sig Dispense Refill   acetaminophen (TYLENOL) 325 MG tablet Take 325 mg by mouth every 6 (six) hours as needed for mild pain or headache.     albuterol (VENTOLIN HFA) 108 (90 Base) MCG/ACT inhaler Inhale 2  puffs into the lungs every 6 (six) hours as needed for wheezing or shortness of breath.      apixaban (ELIQUIS) 5 MG TABS tablet Take 1 tablet (5 mg total) by mouth 2 (two) times daily. Resume Tuesday, 12/21 with morning dose. 60 tablet 5   ascorbic acid (VITAMIN C) 500 MG tablet Take 500 mg by mouth daily.     atorvastatin (LIPITOR) 40 MG tablet Take 40 mg by mouth daily.     baclofen (LIORESAL) 10 MG tablet Take 10 mg by mouth 2 (two) times daily.     benazepril (LOTENSIN) 40 MG tablet Take 40 mg by mouth daily.     flecainide (TAMBOCOR) 50 MG tablet Take 1 tablet (50 mg total) by mouth 2 (two) times daily. 180 tablet 3   furosemide (LASIX) 40 MG tablet Take 80 mg by mouth daily.     glipiZIDE (GLUCOTROL XL) 10 MG 24 hr tablet Take 10 mg by mouth daily.      insulin aspart protamine- aspart (NOVOLOG 70/30) (70-30) 100 UNIT/ML injection Inject 30-46 Units into the skin See admin instructions. Inject 46 units in the morning & 30 units in the evening.     levofloxacin (LEVAQUIN) 500 MG tablet Take 500 mg by mouth daily.     metoprolol succinate (TOPROL-XL) 25 MG 24 hr tablet TAKE 1/2 TABLET(12.5 MG) BY MOUTH DAILY (Patient taking differently: Take 12.5 mg by mouth daily.) 45 tablet 2   moxifloxacin (VIGAMOX) 0.5 % ophthalmic solution Place 1 drop into both eyes in the morning and at bedtime.     nitroGLYCERIN (NITROSTAT) 0.4 MG SL tablet Place 1 tablet (0.4 mg total) under the tongue every 5 (five) minutes as needed for chest pain. 30 tablet 2   ONETOUCH VERIO test strip 2 (two) times daily.     potassium chloride SA (K-DUR) 20 MEQ tablet Take 1 tablet (20 mEq total) by mouth daily. 90 tablet 0   predniSONE (DELTASONE) 10 MG tablet Take 10 mg by mouth daily.     TRULICITY [id number] SOPN Inject 0.75 mg into the skin every Tuesday.     venlafaxine XR (EFFEXOR-XR) 150 MG 24 hr capsule Take 150 mg by mouth daily.     vitamin B-12 (CYANOCOBALAMIN) 50 MCG tablet Take 50 mcg by mouth daily.     No  current facility-administered medications for this visit.    REVIEW OF SYSTEMS:  '[X]'$  denotes positive finding, '[ ]'$  denotes negative finding Cardiac  Comments:  Chest pain or chest pressure: x   Shortness of breath upon exertion: x   Short of breath when lying flat:    Irregular heart rhythm:  Vascular    Pain in calf, thigh, or hip brought on by ambulation: x   Pain in feet at night that wakes you up from your sleep:     Blood clot in your veins:    Leg swelling:  x       Pulmonary    Oxygen at home:    Productive cough:     Wheezing:  x       Neurologic    Sudden weakness in arms or legs:     Sudden numbness in arms or legs:     Sudden onset of difficulty speaking or slurred speech:    Temporary loss of vision in one eye:     Problems with dizziness:         Gastrointestinal    Blood in stool:     Vomited blood:         Genitourinary    Burning when urinating:     Blood in urine:        Psychiatric    Major depression:         Hematologic    Bleeding problems:    Problems with blood clotting too easily:        Skin    Rashes or ulcers:        Constitutional    Fever or chills:      PHYSICAL EXAM: Vitals:   03/18/21 1027 03/18/21 1029  BP: 137/75 139/80  Pulse: 70 70  Temp: 97.9 F (36.6 C)   TempSrc: Temporal   SpO2: 95% 96%  Weight: 230 lb (104.3 kg)   Height: '5\' 5"'$  (1.651 m)     GENERAL: The patient is a well-nourished female, in no acute distress. The vital signs are documented above. CARDIOVASCULAR: Well-healed right carotid incision.  No bruits bilaterally.  2+ radial pulses bilaterally. PULMONARY: There is good air exchange  MUSCULOSKELETAL: There are no major deformities or cyanosis. NEUROLOGIC: No focal weakness or paresthesias are detected. SKIN: There are no ulcers or rashes noted. PSYCHIATRIC: The patient has a normal affect.  DATA:  Duplex from 01/21/2021 at [facility] was reviewed and revealing greater than 70% left  internal carotid artery stenosis and no significant stenosis on the right  MEDICAL ISSUES: I reviewed symptoms of carotid disease with the patient and her daughter.  They know to report immediately to the emergency department should this occur.  Otherwise we will see her again in 6 months with repeat carotid duplex in our office.   [name], MD FACS [facility] [phone] Pager [phone]  Note: Portions of this report may have been transcribed using voice recognition software.  Every effort has been made to ensure accuracy; however, inadvertent computerized transcription errors may still be present.

## 2021-03-19 ENCOUNTER — Other Ambulatory Visit: Payer: Self-pay

## 2021-03-19 DIAGNOSIS — I6522 Occlusion and stenosis of left carotid artery: Secondary | ICD-10-CM

## 2021-03-26 DIAGNOSIS — H40023 Open angle with borderline findings, high risk, bilateral: Secondary | ICD-10-CM | POA: Diagnosis not present

## 2021-03-26 DIAGNOSIS — H524 Presbyopia: Secondary | ICD-10-CM | POA: Diagnosis not present

## 2021-03-26 DIAGNOSIS — Z0100 Encounter for examination of eyes and vision without abnormal findings: Secondary | ICD-10-CM | POA: Diagnosis not present

## 2021-03-27 DIAGNOSIS — H5213 Myopia, bilateral: Secondary | ICD-10-CM | POA: Diagnosis not present

## 2021-03-30 ENCOUNTER — Encounter: Payer: Medicare HMO | Admitting: Internal Medicine

## 2021-04-03 DIAGNOSIS — E1165 Type 2 diabetes mellitus with hyperglycemia: Secondary | ICD-10-CM | POA: Diagnosis not present

## 2021-04-06 ENCOUNTER — Encounter: Payer: Medicare HMO | Admitting: Internal Medicine

## 2021-04-06 DIAGNOSIS — I4819 Other persistent atrial fibrillation: Secondary | ICD-10-CM

## 2021-04-06 DIAGNOSIS — I442 Atrioventricular block, complete: Secondary | ICD-10-CM

## 2021-04-06 DIAGNOSIS — I10 Essential (primary) hypertension: Secondary | ICD-10-CM

## 2021-04-20 ENCOUNTER — Telehealth: Payer: Self-pay

## 2021-04-20 NOTE — Telephone Encounter (Signed)
I tried to help the patient send a transmission but her pacemaker/ serial number was incorrect. Her monitor serial number was incorrect as well. Leanna Sato from [employer] help me get the patient pacemaker model / serial number. Once I change the pacemaker information in [employer] and [facility] the patient correct monitor came up. I helped the patient send a transmission but it did not update in [employer]. I told her I was going to give it 24 hours then I will give her a call back. Call after 10:37 am.

## 2021-04-21 ENCOUNTER — Ambulatory Visit (INDEPENDENT_AMBULATORY_CARE_PROVIDER_SITE_OTHER): Payer: Medicare HMO

## 2021-04-21 DIAGNOSIS — I495 Sick sinus syndrome: Secondary | ICD-10-CM | POA: Diagnosis not present

## 2021-04-21 NOTE — Telephone Encounter (Signed)
Transmission received 04/21/2021.

## 2021-04-23 LAB — CUP PACEART REMOTE DEVICE CHECK
Battery Remaining Longevity: 154 mo
Battery Voltage: 3.15 V
Brady Statistic AP VP Percent: 0 %
Brady Statistic AP VS Percent: 0 %
Brady Statistic AS VP Percent: 99.58 %
Brady Statistic AS VS Percent: 0.42 %
Brady Statistic RA Percent Paced: 0 %
Brady Statistic RV Percent Paced: 99.58 %
Date Time Interrogation Session: 20221018103807
Implantable Lead Implant Date: 20170829
Implantable Lead Location: 753859
Implantable Lead Model: 5076
Implantable Pulse Generator Implant Date: 20211216
Lead Channel Impedance Value: 285 Ohm
Lead Channel Impedance Value: 380 Ohm
Lead Channel Impedance Value: 456 Ohm
Lead Channel Impedance Value: 779 Ohm
Lead Channel Pacing Threshold Amplitude: 0.75 V
Lead Channel Pacing Threshold Pulse Width: 0.4 ms
Lead Channel Sensing Intrinsic Amplitude: 0.375 mV
Lead Channel Sensing Intrinsic Amplitude: 21.75 mV
Lead Channel Sensing Intrinsic Amplitude: 21.75 mV
Lead Channel Setting Pacing Amplitude: 2 V
Lead Channel Setting Pacing Pulse Width: 0.4 ms
Lead Channel Setting Sensing Sensitivity: 1.2 mV

## 2021-04-24 DIAGNOSIS — H524 Presbyopia: Secondary | ICD-10-CM | POA: Diagnosis not present

## 2021-04-30 NOTE — Progress Notes (Signed)
Remote pacemaker transmission.   

## 2021-05-04 DIAGNOSIS — E1165 Type 2 diabetes mellitus with hyperglycemia: Secondary | ICD-10-CM | POA: Diagnosis not present

## 2021-05-05 ENCOUNTER — Other Ambulatory Visit: Payer: Self-pay | Admitting: Cardiology

## 2021-05-06 ENCOUNTER — Other Ambulatory Visit: Payer: Self-pay | Admitting: Cardiology

## 2021-05-06 DIAGNOSIS — R69 Illness, unspecified: Secondary | ICD-10-CM | POA: Diagnosis not present

## 2021-05-06 DIAGNOSIS — I10 Essential (primary) hypertension: Secondary | ICD-10-CM | POA: Diagnosis not present

## 2021-05-06 DIAGNOSIS — I5030 Unspecified diastolic (congestive) heart failure: Secondary | ICD-10-CM | POA: Diagnosis not present

## 2021-05-06 DIAGNOSIS — Z299 Encounter for prophylactic measures, unspecified: Secondary | ICD-10-CM | POA: Diagnosis not present

## 2021-05-06 DIAGNOSIS — K219 Gastro-esophageal reflux disease without esophagitis: Secondary | ICD-10-CM | POA: Diagnosis not present

## 2021-05-06 DIAGNOSIS — E1165 Type 2 diabetes mellitus with hyperglycemia: Secondary | ICD-10-CM | POA: Diagnosis not present

## 2021-06-03 DIAGNOSIS — I10 Essential (primary) hypertension: Secondary | ICD-10-CM | POA: Diagnosis not present

## 2021-06-03 DIAGNOSIS — E1165 Type 2 diabetes mellitus with hyperglycemia: Secondary | ICD-10-CM | POA: Diagnosis not present

## 2021-06-03 DIAGNOSIS — K219 Gastro-esophageal reflux disease without esophagitis: Secondary | ICD-10-CM | POA: Diagnosis not present

## 2021-06-11 ENCOUNTER — Telehealth: Payer: Self-pay

## 2021-06-11 NOTE — Telephone Encounter (Signed)
Patient called in stating she has received her letter about [facility] and wants to know the next steps about choosing a new provider

## 2021-06-12 ENCOUNTER — Other Ambulatory Visit: Payer: Self-pay | Admitting: Cardiology

## 2021-06-15 ENCOUNTER — Other Ambulatory Visit: Payer: Self-pay | Admitting: Cardiology

## 2021-07-03 DIAGNOSIS — E1165 Type 2 diabetes mellitus with hyperglycemia: Secondary | ICD-10-CM | POA: Diagnosis not present

## 2021-07-07 NOTE — Progress Notes (Deleted)
Cardiology Office Note  Date: 07/07/2021   ID: Miranda Sexton, DOB 1940/08/28, MRN [id number]  PCP:  Glenda Chroman, MD  Cardiologist:  Rozann Lesches, MD Electrophysiologist:  [name], MD   No chief complaint on file.   History of Present Illness: Miranda Sexton is an 81 y.o. female last seen in July 2021.  She has been followed most recently by Dr. Lovena Le, history of complete heart block and pacemaker revision in December 2021.  She has a Medtronic dual-chamber device in place at this point.  Most recent device interrogation indicated normal function.  Past Medical History:  Diagnosis Date   Anxiety    Asthma    Carotid artery occlusion    Chronic bronchitis (HCC)    Chronic diastolic heart failure (HCC)    CKD (chronic kidney disease) stage 2, GFR 60-89 ml/min    COPD (chronic obstructive pulmonary disease) (HCC)    Coronary atherosclerosis of native coronary artery    Nonobstructive 08/2008   Daily headache    DJD (degenerative joint disease)    Essential hypertension    GERD (gastroesophageal reflux disease)    Hyperlipidemia    Obesity    On home oxygen therapy    PAD (peripheral artery disease) (HCC)    Moderate right renal artery stenosis 08/2008   Paroxysmal atrial fibrillation (HCC)    Pneumonia    Presence of permanent cardiac pacemaker    Medtronic Advisa DR MRI SureScan model [id number] PPM 03/03/16   Sick sinus syndrome (HCC)    Medtronic dual-chamber his bundle pacemaker - Dr. Rayann Heman   Type 2 diabetes mellitus ([place])     Past Surgical History:  Procedure Laterality Date   BIOPSY  08/03/2017   Procedure: BIOPSY;  Surgeon: [name], MD;  [facility];  Service: Endoscopy;;  gastric    CARDIAC CATHETERIZATION  06/20/2018   CATARACT EXTRACTION W/ INTRAOCULAR LENS  IMPLANT, BILATERAL Bilateral 2008   COLONOSCOPY N/A 08/03/2017   Procedure: COLONOSCOPY;  Surgeon: [name], MD;  [facility]: [facility];  Service: Endoscopy;   Laterality: N/A;   ENDARTERECTOMY Right 12/08/2012   Procedure: ENDARTERECTOMY CAROTID;  Surgeon: [name], MD;  Location: [place];  Service: Vascular;  Laterality: Right;   EP IMPLANTABLE DEVICE N/A 03/02/2016   Procedure: Pacemaker Implant;  Surgeon: [name], MD;  Location: [place] [facility];  Service: Cardiovascular;  Laterality: N/A;   ESOPHAGOGASTRODUODENOSCOPY N/A 08/03/2017   Procedure: ESOPHAGOGASTRODUODENOSCOPY (EGD);  Surgeon: [name], MD;  [facility];  Service: Endoscopy;  Laterality: N/A;   ESOPHAGOGASTRODUODENOSCOPY (EGD) WITH PROPOFOL N/A 05/11/2019   Procedure: ESOPHAGOGASTRODUODENOSCOPY (EGD) WITH PROPOFOL;  Surgeon: [name], MD;  [facility];  Service: Endoscopy;  Laterality: N/A;  145pm   INSERT / REPLACE / REMOVE PACEMAKER  03/02/2016   LEFT HEART CATH AND CORONARY ANGIOGRAPHY N/A 06/20/2018   Procedure: LEFT HEART CATH AND CORONARY ANGIOGRAPHY;  Surgeon: [name], MD;  Location: [place] [facility];  Service: Cardiovascular;  Laterality: N/A;   MEDTR[employer] PACEMAKER RV LEAD REMOVAL AND PLACMENT GENREATOR CHANGE OUT, (N/A Chest)  06/19/2020   PACEMAKER LEAD REMOVAL N/A 06/19/2020   Procedure: [employer]TR[employer] PACEMAKER RV LEAD REMOVAL AND [place],;  Surgeon: Evans Lance, MD;  Location: [facility] OR;  Service: Cardiovascular;  Laterality: N/A;   PARTIAL COLECTOMY N/A 10/19/2017   Procedure: PARTIAL COLECTOMY;  Surgeon: [name], MD;  Location: [facility];  Service:  General;  Laterality: N/A;   POLYPECTOMY  08/03/2017   Procedure: POLYPECTOMY;  Surgeon: [name], MD;  [facility];  Service: Endoscopy;;  colon    Current Outpatient Medications  Medication Sig Dispense Refill   acetaminophen (TYLENOL) 325 MG tablet Take 325 mg by mouth every 6 (six) hours as needed for mild pain or headache.     albuterol (VENTOLIN HFA) 108 (90 Base) MCG/ACT inhaler Inhale 2 puffs into the lungs every 6 (six)  hours as needed for wheezing or shortness of breath.      apixaban (ELIQUIS) 5 MG TABS tablet Take 1 tablet (5 mg total) by mouth 2 (two) times daily. Resume Tuesday, 12/21 with morning dose. 60 tablet 5   ascorbic acid (VITAMIN C) 500 MG tablet Take 500 mg by mouth daily.     atorvastatin (LIPITOR) 40 MG tablet Take 40 mg by mouth daily.     baclofen (LIORESAL) 10 MG tablet Take 10 mg by mouth 2 (two) times daily.     benazepril (LOTENSIN) 40 MG tablet Take 40 mg by mouth daily.     flecainide (TAMBOCOR) 50 MG tablet Take 1 tablet (50 mg total) by mouth 2 (two) times daily. 180 tablet 3   furosemide (LASIX) 40 MG tablet Take 80 mg by mouth daily.     glipiZIDE (GLUCOTROL XL) 10 MG 24 hr tablet Take 10 mg by mouth daily.      insulin aspart protamine- aspart (NOVOLOG 70/30) (70-30) 100 UNIT/ML injection Inject 30-46 Units into the skin See admin instructions. Inject 46 units in the morning & 30 units in the evening.     levofloxacin (LEVAQUIN) 500 MG tablet Take 500 mg by mouth daily.     metoprolol succinate (TOPROL-XL) 25 MG 24 hr tablet TAKE 1/2 TABLET(12.5 MG) BY MOUTH DAILY 45 tablet 1   moxifloxacin (VIGAMOX) 0.5 % ophthalmic solution Place 1 drop into both eyes in the morning and at bedtime.     nitroGLYCERIN (NITROSTAT) 0.4 MG SL tablet Place 1 tablet (0.4 mg total) under the tongue every 5 (five) minutes as needed for chest pain. 30 tablet 2   ONETOUCH VERIO test strip 2 (two) times daily.     potassium chloride SA (K-DUR) 20 MEQ tablet Take 1 tablet (20 mEq total) by mouth daily. 90 tablet 0   predniSONE (DELTASONE) 10 MG tablet Take 10 mg by mouth daily.     TRULICITY 5.80 DX/8.3JA SOPN Inject 0.75 mg into the skin every Tuesday.     venlafaxine XR (EFFEXOR-XR) 150 MG 24 hr capsule Take 150 mg by mouth daily.     vitamin B-12 (CYANOCOBALAMIN) 50 MCG tablet Take 50 mcg by mouth daily.     No current facility-administered medications for this visit.   Allergies:  Codeine and  Penicillins   Social History: The patient  reports that she has never smoked. She has never used smokeless tobacco. She reports that she does not drink alcohol and does not use drugs.   Family History: The patient's family history includes Cancer in her mother; Deep vein thrombosis in her mother; Diabetes in her brother, mother, and sister; Heart disease in her mother and sister; Hyperlipidemia in her brother and sister; Hypertension in her brother, father, mother, and sister.   ROS:  Please see the history of present illness. Otherwise, complete review of systems is positive for {NONE DEFAULTED:18576}.  All other systems are reviewed and negative.   Physical Exam: VS:  LMP  (LMP  Unknown) , BMI There is no height or weight on file to calculate BMI.  Wt Readings from Last 3 Encounters:  03/18/21 230 lb (104.3 kg)  09/23/20 228 lb 6.4 oz (103.6 kg)  06/19/20 221 lb (100.2 kg)    General: Patient appears comfortable at rest. HEENT: Conjunctiva and lids normal, oropharynx clear with moist mucosa. Neck: Supple, no elevated JVP or carotid bruits, no thyromegaly. Lungs: Clear to auscultation, nonlabored breathing at rest. Cardiac: Regular rate and rhythm, no S3 or significant systolic murmur, no pericardial rub. Abdomen: Soft, nontender, no hepatomegaly, bowel sounds present, no guarding or rebound. Extremities: No pitting edema, distal pulses 2+. Skin: Warm and dry. Musculoskeletal: No kyphosis. Neuropsychiatric: Alert and oriented x3, affect grossly appropriate.  ECG:  An ECG dated 01/05/2021 was personally reviewed today and demonstrated:  Ventricular paced rhythm.  Recent Labwork: 01/05/2021: ALT 24; AST 29; BUN 20; Creatinine, Ser 1.15; Hemoglobin 13.0; Platelets 254; Potassium 4.8; Sodium 142     Component Value Date/Time   CHOL 97 06/18/2018 0724   TRIG 62 06/18/2018 0724   HDL 36 (L) 06/18/2018 0724   CHOLHDL 2.7 06/18/2018 0724   VLDL 12 06/18/2018 0724   LDLCALC 49 06/18/2018  0724    Other Studies Reviewed Today:  Echocardiogram 05/16/2019:  1. Left ventricular ejection fraction, by visual estimation, is 65 to  70%. The left ventricle has hyperdynamic function. There is no left  ventricular hypertrophy.   2. Elevated left ventricular end-diastolic pressure.   3. Left ventricular diastolic parameters are indeterminate.   4. The left ventricle has no regional wall motion abnormalities.   5. Global right ventricle has normal systolic function.The right  ventricular size is normal. No increase in right ventricular wall  thickness.   6. Left atrial size was normal.   7. Right atrial size was normal.   8. Mild mitral annular calcification.   9. The mitral valve is degenerative. Moderate mitral valve regurgitation.  10. The tricuspid valve is grossly normal. Tricuspid valve regurgitation  moderate.  11. The aortic valve is tricuspid. Aortic valve regurgitation is not  visualized. Mild aortic valve stenosis.  12. There is Moderate calcification of the aortic valve.  13. There is Moderate thickening of the aortic valve.  14. The pulmonic valve was grossly normal. Pulmonic valve regurgitation is  trivial.  15. Moderately elevated pulmonary artery systolic pressure.  16. A pacer wire is visualized.  17. The inferior vena cava is normal in size with greater than 50%  respiratory variability, suggesting right atrial pressure of 3 mmHg.   Carotid Doppler 01/21/2021: IMPRESSION: Right ICA narrowing less than 50%   Left ICA stenosis estimated at greater than 70%   Patent antegrade vertebral flow bilaterally  Assessment and Plan:    Medication Adjustments/Labs and Tests Ordered: Current medicines are reviewed at length with the patient today.  Concerns regarding medicines are outlined above.   Tests Ordered: No orders of the defined types were placed in this encounter.   Medication Changes: No orders of the defined types were placed in this  encounter.   Disposition:  Follow up {follow up:15908}  Signed, Satira Sark, MD, [facility] 07/07/2021 1:25 PM    [facility] [place], [place], [place] [address] Phone: [phone]; Fax: [phone]

## 2021-07-08 ENCOUNTER — Ambulatory Visit: Payer: Medicare HMO | Admitting: Cardiology

## 2021-07-08 DIAGNOSIS — I442 Atrioventricular block, complete: Secondary | ICD-10-CM

## 2021-07-08 NOTE — Progress Notes (Signed)
Cardiology Office Note  Date: 07/09/2021   ID: Miranda, Sexton 16-Dec-1940, MRN [id number]  PCP:  Glenda Chroman, MD  Cardiologist:  Rozann Lesches, MD Electrophysiologist:  [name], MD   Chief Complaint  Patient presents with   Cardiac follow-up    History of Present Illness: Miranda Sexton is an 81 y.o. female last seen in July 2021.  She is here for a follow-up visit.  Reports doing reasonably well overall.  She describes NYHA class II dyspnea, fluctuating lower leg swelling for which she continues to take Lasix.  Does not report any major weight gain.  She has been followed most recently by Dr. Lovena Le, history of complete heart block and pacemaker revision in December 2021.  She has a Medtronic dual-chamber device in place at this point.  Most recent device interrogation indicated normal function.  We went over her medications which are noted below.  She does not report any bleeding problems on Eliquis.  She continues to follow with Dr. Clayton Bibles is for routine lab work.  Past Medical History:  Diagnosis Date   Anxiety    Asthma    Carotid artery occlusion    Chronic bronchitis (HCC)    Chronic diastolic heart failure (HCC)    CKD (chronic kidney disease) stage 2, GFR 60-89 ml/min    COPD (chronic obstructive pulmonary disease) (HCC)    Coronary atherosclerosis of native coronary artery    Nonobstructive 08/2008   Daily headache    DJD (degenerative joint disease)    Essential hypertension    GERD (gastroesophageal reflux disease)    Hyperlipidemia    Obesity    On home oxygen therapy    PAD (peripheral artery disease) (HCC)    Moderate right renal artery stenosis 08/2008   Paroxysmal atrial fibrillation (HCC)    Pneumonia    Presence of permanent cardiac pacemaker    Medtronic Advisa DR MRI SureScan model [id number] PPM 03/03/16   Sick sinus syndrome (HCC)    Medtronic dual-chamber his bundle pacemaker - Dr. Rayann Heman   Type 2 diabetes mellitus ([place])     Past  Surgical History:  Procedure Laterality Date   BIOPSY  08/03/2017   Procedure: BIOPSY;  Surgeon: [name], MD;  [facility];  Service: Endoscopy;;  gastric    CARDIAC CATHETERIZATION  06/20/2018   CATARACT EXTRACTION W/ INTRAOCULAR LENS  IMPLANT, BILATERAL Bilateral 2008   COLONOSCOPY N/A 08/03/2017   Procedure: COLONOSCOPY;  Surgeon: [name], MD;  [facility];  Service: Endoscopy;  Laterality: N/A;   ENDARTERECTOMY Right 12/08/2012   Procedure: ENDARTERECTOMY CAROTID;  Surgeon: [name], MD;  Location: [facility];  Service: Vascular;  Laterality: Right;   EP IMPLANTABLE DEVICE N/A 03/02/2016   Procedure: Pacemaker Implant;  Surgeon: [name], MD;  Location: [place] [facility];  Service: Cardiovascular;  Laterality: N/A;   ESOPHAGOGASTRODUODENOSCOPY N/A 08/03/2017   Procedure: ESOPHAGOGASTRODUODENOSCOPY (EGD);  Surgeon: [name], MD;  [facility];  Service: Endoscopy;  Laterality: N/A;   ESOPHAGOGASTRODUODENOSCOPY (EGD) WITH PROPOFOL N/A 05/11/2019   Procedure: ESOPHAGOGASTRODUODENOSCOPY (EGD) WITH PROPOFOL;  Surgeon: [name], MD;  [facility];  Service: Endoscopy;  Laterality: N/A;  145pm   INSERT / REPLACE / REMOVE PACEMAKER  03/02/2016   LEFT HEART CATH AND CORONARY ANGIOGRAPHY N/A 06/20/2018   Procedure: LEFT HEART CATH AND CORONARY ANGIOGRAPHY;  Surgeon: [name], MD;  Location: [place] [facility];  Service: Cardiovascular;  Laterality: N/A;   MEDTRONIC PACEMAKER RV LEAD REMOVAL AND PLACMENT GENREATOR CHANGE OUT, (N/A Chest)  06/19/2020   PACEMAKER LEAD REMOVAL N/A 06/19/2020   Procedure: MEDTRONIC PACEMAKER RV LEAD REMOVAL AND [place],;  Surgeon: Evans Lance, MD;  Location: [place];  Service: Cardiovascular;  Laterality: N/A;   PARTIAL COLECTOMY N/A 10/19/2017   Procedure: PARTIAL COLECTOMY;  Surgeon: [name], MD;  [facility];  Service: General;  Laterality: N/A;    POLYPECTOMY  08/03/2017   Procedure: POLYPECTOMY;  Surgeon: [name], MD;  [facility];  Service: Endoscopy;;  colon    Current Outpatient Medications  Medication Sig Dispense Refill   acetaminophen (TYLENOL) 325 MG tablet Take 325 mg by mouth every 6 (six) hours as needed for mild pain or headache.     albuterol (VENTOLIN HFA) 108 (90 Base) MCG/ACT inhaler Inhale 2 puffs into the lungs every 6 (six) hours as needed for wheezing or shortness of breath.      apixaban (ELIQUIS) 5 MG TABS tablet Take 1 tablet (5 mg total) by mouth 2 (two) times daily. Resume Tuesday, 12/21 with morning dose. 60 tablet 5   ascorbic acid (VITAMIN C) 500 MG tablet Take 500 mg by mouth daily.     atorvastatin (LIPITOR) 40 MG tablet Take 40 mg by mouth daily.     baclofen (LIORESAL) 10 MG tablet Take 10 mg by mouth 2 (two) times daily.     benazepril (LOTENSIN) 40 MG tablet Take 40 mg by mouth daily.     flecainide (TAMBOCOR) 50 MG tablet Take 1 tablet (50 mg total) by mouth 2 (two) times daily. 180 tablet 3   furosemide (LASIX) 40 MG tablet Take 80 mg by mouth daily.     glipiZIDE (GLUCOTROL XL) 10 MG 24 hr tablet Take 10 mg by mouth daily.      insulin aspart protamine- aspart (NOVOLOG 70/30) (70-30) 100 UNIT/ML injection Inject 30-46 Units into the skin See admin instructions. Inject 46 units in the morning & 30 units in the evening.     metoprolol succinate (TOPROL-XL) 25 MG 24 hr tablet TAKE 1/2 TABLET(12.5 MG) BY MOUTH DAILY 45 tablet 1   moxifloxacin (VIGAMOX) 0.5 % ophthalmic solution Place 1 drop into both eyes in the morning and at bedtime.     nitroGLYCERIN (NITROSTAT) 0.4 MG SL tablet Place 1 tablet (0.4 mg total) under the tongue every 5 (five) minutes as needed for chest pain. 30 tablet 2   ONETOUCH VERIO test strip 2 (two) times daily.     potassium chloride SA (K-DUR) 20 MEQ tablet Take 1 tablet (20 mEq total) by mouth daily. 90 tablet 0   TRULICITY 0.96 EA/5.4UJ SOPN Inject 0.75 mg  into the skin every Tuesday.     venlafaxine XR (EFFEXOR-XR) 150 MG 24 hr capsule Take 150 mg by mouth daily.     vitamin B-12 (CYANOCOBALAMIN) 50 MCG tablet Take 50 mcg by mouth daily.     No current facility-administered medications for this visit.   Allergies:  Codeine and Penicillins   ROS: No orthopnea or PND.  No syncope.  Physical Exam: VS:  BP 128/72    Pulse 84    Ht 5\' 5"  (1.651 m)    Wt 233 lb 12.8 oz (106.1 kg)    LMP  (LMP Unknown)    SpO2 93%    BMI 38.91 kg/m , BMI Body mass index is 38.91 kg/m.  Wt  Readings from Last 3 Encounters:  07/09/21 233 lb 12.8 oz (106.1 kg)  03/18/21 230 lb (104.3 kg)  09/23/20 228 lb 6.4 oz (103.6 kg)    General: Patient appears comfortable at rest. HEENT: Conjunctiva and lids normal, wearing a mask. Neck: Supple, no elevated JVP or carotid bruits, no thyromegaly. Lungs: Clear to auscultation, nonlabored breathing at rest. Cardiac: Regular rate and rhythm, no S3, 1/2 systolic murmur, no pericardial rub. Extremities: 1+ lower leg edema.  ECG:  An ECG dated 01/05/2021 was personally reviewed today and demonstrated:  Ventricular paced rhythm.  Recent Labwork: 01/05/2021: ALT 24; AST 29; BUN 20; Creatinine, Ser 1.15; Hemoglobin 13.0; Platelets 254; Potassium 4.8; Sodium 142     Component Value Date/Time   CHOL 97 06/18/2018 0724   TRIG 62 06/18/2018 0724   HDL 36 (L) 06/18/2018 0724   CHOLHDL 2.7 06/18/2018 0724   VLDL 12 06/18/2018 0724   LDLCALC 49 06/18/2018 0724    Other Studies Reviewed Today:  Echocardiogram 05/16/2019:  1. Left ventricular ejection fraction, by visual estimation, is 65 to  70%. The left ventricle has hyperdynamic function. There is no left  ventricular hypertrophy.   2. Elevated left ventricular end-diastolic pressure.   3. Left ventricular diastolic parameters are indeterminate.   4. The left ventricle has no regional wall motion abnormalities.   5. Global right ventricle has normal systolic function.The  right  ventricular size is normal. No increase in right ventricular wall  thickness.   6. Left atrial size was normal.   7. Right atrial size was normal.   8. Mild mitral annular calcification.   9. The mitral valve is degenerative. Moderate mitral valve regurgitation.  10. The tricuspid valve is grossly normal. Tricuspid valve regurgitation  moderate.  11. The aortic valve is tricuspid. Aortic valve regurgitation is not  visualized. Mild aortic valve stenosis.  12. There is Moderate calcification of the aortic valve.  13. There is Moderate thickening of the aortic valve.  14. The pulmonic valve was grossly normal. Pulmonic valve regurgitation is  trivial.  15. Moderately elevated pulmonary artery systolic pressure.  16. A pacer wire is visualized.  17. The inferior vena cava is normal in size with greater than 50%  respiratory variability, suggesting right atrial pressure of 3 mmHg.   Carotid Doppler 01/21/2021: IMPRESSION: Right ICA narrowing less than 50%   Left ICA stenosis estimated at greater than 70%   Patent antegrade vertebral flow bilaterally  Assessment and Plan:  1.  Paroxysmal atrial fibrillation with CHA2DS2-VASc score of 6.  She is doing well without significant palpitations.  Plan to continue Eliquis for stroke prophylaxis, also on Toprol-XL and flecainide.  I reviewed her interval lab work.  2.  Carotid artery disease, follow-up with Dr. Donnetta Hutching noted in September 2022.  She is asymptomatic and remains on Lipitor.  She has greater than 16% LICA stenosis, 10% RICA stenosis.  Follow-up imaging planned with Dr. Donnetta Hutching in 6 months timeframe.  3.  Sick sinus syndrome with Medtronic dual-chamber pacemaker in place, now followed by Dr. Lovena Le.  4.  HFpEF with chronic diastolic heart failure.  No change in current diuretic regimen.  She is also on Lotensin and Toprol-XL.  Medication Adjustments/Labs and Tests Ordered: Current medicines are reviewed at length with the  patient today.  Concerns regarding medicines are outlined above.   Tests Ordered: No orders of the defined types were placed in this encounter.   Medication Changes: No orders of the defined types were placed  in this encounter.   Disposition:  Follow up  6 months.  Signed, Satira Sark, MD, [facility] 07/09/2021 12:43 PM    [facility] [place], [place], [place] [address] Phone: [phone]; Fax: [phone]

## 2021-07-09 ENCOUNTER — Encounter: Payer: Self-pay | Admitting: Cardiology

## 2021-07-09 ENCOUNTER — Ambulatory Visit (INDEPENDENT_AMBULATORY_CARE_PROVIDER_SITE_OTHER): Payer: Medicare HMO | Admitting: Cardiology

## 2021-07-09 VITALS — BP 128/72 | HR 84 | Ht 65.0 in | Wt 233.8 lb

## 2021-07-09 DIAGNOSIS — I5032 Chronic diastolic (congestive) heart failure: Secondary | ICD-10-CM

## 2021-07-09 DIAGNOSIS — I495 Sick sinus syndrome: Secondary | ICD-10-CM

## 2021-07-09 DIAGNOSIS — I480 Paroxysmal atrial fibrillation: Secondary | ICD-10-CM | POA: Diagnosis not present

## 2021-07-09 NOTE — Patient Instructions (Addendum)

## 2021-07-21 ENCOUNTER — Ambulatory Visit (INDEPENDENT_AMBULATORY_CARE_PROVIDER_SITE_OTHER): Payer: Medicare HMO | Admitting: Internal Medicine

## 2021-07-21 ENCOUNTER — Other Ambulatory Visit: Payer: Self-pay

## 2021-07-21 ENCOUNTER — Ambulatory Visit (INDEPENDENT_AMBULATORY_CARE_PROVIDER_SITE_OTHER): Payer: Medicare HMO

## 2021-07-21 ENCOUNTER — Encounter: Payer: Self-pay | Admitting: Internal Medicine

## 2021-07-21 VITALS — BP 150/74 | HR 94 | Ht 65.0 in | Wt 234.2 lb

## 2021-07-21 DIAGNOSIS — I442 Atrioventricular block, complete: Secondary | ICD-10-CM

## 2021-07-21 DIAGNOSIS — I480 Paroxysmal atrial fibrillation: Secondary | ICD-10-CM | POA: Diagnosis not present

## 2021-07-21 LAB — CUP PACEART REMOTE DEVICE CHECK
Battery Remaining Longevity: 150 mo
Battery Voltage: 3.1 V
Brady Statistic AP VP Percent: 0 %
Brady Statistic AP VS Percent: 0 %
Brady Statistic AS VP Percent: 98.73 %
Brady Statistic AS VS Percent: 1.27 %
Brady Statistic RA Percent Paced: 0 %
Brady Statistic RV Percent Paced: 98.73 %
Date Time Interrogation Session: 20230117030700
Implantable Lead Implant Date: 20170829
Implantable Lead Location: 753859
Implantable Lead Model: 5076
Implantable Pulse Generator Implant Date: 20211216
Lead Channel Impedance Value: 285 Ohm
Lead Channel Impedance Value: 380 Ohm
Lead Channel Impedance Value: 437 Ohm
Lead Channel Impedance Value: 722 Ohm
Lead Channel Pacing Threshold Amplitude: 0.625 V
Lead Channel Pacing Threshold Pulse Width: 0.4 ms
Lead Channel Sensing Intrinsic Amplitude: 0.375 mV
Lead Channel Sensing Intrinsic Amplitude: 31.625 mV
Lead Channel Sensing Intrinsic Amplitude: 31.625 mV
Lead Channel Setting Pacing Amplitude: 2 V
Lead Channel Setting Pacing Pulse Width: 0.4 ms
Lead Channel Setting Sensing Sensitivity: 1.2 mV

## 2021-07-21 NOTE — Progress Notes (Signed)
HPI Miranda Sexton returns today for ongoing PM followup.  She is a pleasant 81 yo woman with CHB, s/p PPM insertion. She has a his bundle lead position and has been found to have increasing thresholds. She underwent PM gen change out and removal of the old RV lead and insertion of a new RV lead in the left bundle area. In the interim, she notes no chest pain, sob, or fever. No difficulty healing. Allergies  Allergen Reactions   Codeine Nausea And Vomiting   Penicillins Rash and Other (See Comments)    Has patient had a PCN reaction causing immediate rash, facial/tongue/throat swelling, SOB or lightheadedness with hypotension: Yes Has patient had a PCN reaction causing severe rash involving mucus membranes or skin necrosis: No Has patient had a PCN reaction that required hospitalization No Has patient had a PCN reaction occurring within the last 10 years: No If all of the above answers are "NO", then may proceed with Cephalosporin use.      Current Outpatient Medications  Medication Sig Dispense Refill   acetaminophen (TYLENOL) 325 MG tablet Take 325 mg by mouth every 6 (six) hours as needed for mild pain or headache.     albuterol (VENTOLIN HFA) 108 (90 Base) MCG/ACT inhaler Inhale 2 puffs into the lungs every 6 (six) hours as needed for wheezing or shortness of breath.      apixaban (ELIQUIS) 5 MG TABS tablet Take 1 tablet (5 mg total) by mouth 2 (two) times daily. Resume Tuesday, 12/21 with morning dose. 60 tablet 5   ascorbic acid (VITAMIN C) 500 MG tablet Take 500 mg by mouth daily.     atorvastatin (LIPITOR) 40 MG tablet Take 40 mg by mouth daily.     baclofen (LIORESAL) 10 MG tablet Take 10 mg by mouth 2 (two) times daily.     benazepril (LOTENSIN) 40 MG tablet Take 40 mg by mouth daily.     flecainide (TAMBOCOR) 50 MG tablet Take 1 tablet (50 mg total) by mouth 2 (two) times daily. 180 tablet 3   furosemide (LASIX) 40 MG tablet Take 80 mg by mouth daily.     glipiZIDE  (GLUCOTROL XL) 10 MG 24 hr tablet Take 10 mg by mouth daily.      insulin aspart protamine- aspart (NOVOLOG 70/30) (70-30) 100 UNIT/ML injection Inject 30-46 Units into the skin See admin instructions. Inject 46 units in the morning & 30 units in the evening.     metoprolol succinate (TOPROL-XL) 25 MG 24 hr tablet TAKE 1/2 TABLET(12.5 MG) BY MOUTH DAILY 45 tablet 1   moxifloxacin (VIGAMOX) 0.5 % ophthalmic solution Place 1 drop into both eyes in the morning and at bedtime.     nitroGLYCERIN (NITROSTAT) 0.4 MG SL tablet Place 1 tablet (0.4 mg total) under the tongue every 5 (five) minutes as needed for chest pain. 30 tablet 2   ONETOUCH VERIO test strip 2 (two) times daily.     potassium chloride SA (K-DUR) 20 MEQ tablet Take 1 tablet (20 mEq total) by mouth daily. 90 tablet 0   TRULICITY 3.22 GU/5.4YH SOPN Inject 0.75 mg into the skin every Tuesday.     venlafaxine XR (EFFEXOR-XR) 150 MG 24 hr capsule Take 150 mg by mouth daily.     vitamin B-12 (CYANOCOBALAMIN) 50 MCG tablet Take 50 mcg by mouth daily.     No current facility-administered medications for this visit.     Past Medical History:  Diagnosis Date  Anxiety    Asthma    Carotid artery occlusion    Chronic bronchitis (HCC)    Chronic diastolic heart failure (HCC)    CKD (chronic kidney disease) stage 2, GFR 60-89 ml/min    COPD (chronic obstructive pulmonary disease) (HCC)    Coronary atherosclerosis of native coronary artery    Nonobstructive 08/2008   Daily headache    DJD (degenerative joint disease)    Essential hypertension    GERD (gastroesophageal reflux disease)    Hyperlipidemia    Obesity    On home oxygen therapy    PAD (peripheral artery disease) (HCC)    Moderate right renal artery stenosis 08/2008   Paroxysmal atrial fibrillation (HCC)    Pneumonia    Presence of permanent cardiac pacemaker    Medtronic Advisa DR MRI SureScan model A2DR01 PPM 03/03/16   Sick sinus syndrome (HCC)    Medtronic dual-chamber  his bundle pacemaker - Dr. Rayann Heman   Type 2 diabetes mellitus ([place])     ROS:   All systems reviewed and negative except as noted in the HPI.   Past Surgical History:  Procedure Laterality Date   BIOPSY  08/03/2017   Procedure: BIOPSY;  Surgeon: [name], MD;  [facility];  Service: Endoscopy;;  gastric    CARDIAC CATHETERIZATION  06/20/2018   CATARACT EXTRACTION W/ INTRAOCULAR LENS  IMPLANT, BILATERAL Bilateral 2008   COLONOSCOPY N/A 08/03/2017   Procedure: COLONOSCOPY;  Surgeon: [name], MD;  [facility];  Service: Endoscopy;  Laterality: N/A;   ENDARTERECTOMY Right 12/08/2012   Procedure: ENDARTERECTOMY CAROTID;  Surgeon: [name], MD;  Location: [place];  Service: Vascular;  Laterality: Right;   EP IMPLANTABLE DEVICE N/A 03/02/2016   Procedure: Pacemaker Implant;  Surgeon: [name], MD;  Location: [place] [facility];  Service: Cardiovascular;  Laterality: N/A;   ESOPHAGOGASTRODUODENOSCOPY N/A 08/03/2017   Procedure: ESOPHAGOGASTRODUODENOSCOPY (EGD);  Surgeon: [name], MD;  [facility];  Service: Endoscopy;  Laterality: N/A;   ESOPHAGOGASTRODUODENOSCOPY (EGD) WITH PROPOFOL N/A 05/11/2019   Procedure: ESOPHAGOGASTRODUODENOSCOPY (EGD) WITH PROPOFOL;  Surgeon: [name], MD;  [facility];  Service: Endoscopy;  Laterality: N/A;  145pm   INSERT / REPLACE / REMOVE PACEMAKER  03/02/2016   LEFT HEART CATH AND CORONARY ANGIOGRAPHY N/A 06/20/2018   Procedure: LEFT HEART CATH AND CORONARY ANGIOGRAPHY;  Surgeon: [name], MD;  Location: [place] [facility];  Service: Cardiovascular;  Laterality: N/A;   MEDTRONIC PACEMAKER RV LEAD REMOVAL AND PLACMENT GENREATOR CHANGE OUT, (N/A Chest)  06/19/2020   PACEMAKER LEAD REMOVAL N/A 06/19/2020   Procedure: MEDTRONIC PACEMAKER RV LEAD REMOVAL AND PLACMENT GENREATOR CHANGE OUT,;  Surgeon: Evans Lance, MD;  [facility];  Service: Cardiovascular;  Laterality: N/A;    PARTIAL COLECTOMY N/A 10/19/2017   Procedure: PARTIAL COLECTOMY;  Surgeon: [name], MD;  [facility];  Service: General;  Laterality: N/A;   POLYPECTOMY  08/03/2017   Procedure: POLYPECTOMY;  Surgeon: [name], MD;  [facility];  Service: Endoscopy;;  colon     Family History  Problem Relation Age of Onset   Cancer Mother        Stomach   Deep vein thrombosis Mother    Diabetes Mother    Hypertension Mother    Heart disease Mother    Hypertension Father    Diabetes Sister    Hyperlipidemia Sister    Hypertension Sister  Heart disease Sister    Diabetes Brother    Hyperlipidemia Brother    Hypertension Brother      Social History   Socioeconomic History   Marital status: Married    Spouse name: Not on file   Number of children: Not on file   Years of education: Not on file   Highest education level: Not on file  Occupational History   Not on file  Tobacco Use   Smoking status: Never   Smokeless tobacco: Never  Vaping Use   Vaping Use: Never used  Substance and Sexual Activity   Alcohol use: No    Alcohol/week: 0.0 standard drinks   Drug use: No   Sexual activity: Not Currently    Birth control/protection: None  Other Topics Concern   Not on file  Social History Narrative   Lives in [place] with spouse.  4 grown children.   Retired but continues to clean houses   [place] Strain: Not on file  Food Insecurity: Not on file  Transportation Needs: Not on file  Physical Activity: Not on file  Stress: Not on file  Social Connections: Not on file  Intimate Partner Violence: Not on file     Ht 5\' 5"  (1.651 m)    Wt 234 lb 3.2 oz (106.2 kg)    LMP  (LMP Unknown)    BMI 38.97 kg/m   Physical Exam:  Well appearing NAD HEENT: Unremarkable Neck:  No JVD, no thyromegally Lymphatics:  No adenopathy Back:  No CVA tenderness Lungs:  Clear with no wheezes HEART:  Regular rate rhythm, no  murmurs, no rubs, no clicks Abd:  soft, positive bowel sounds, no organomegally, no rebound, no guarding Ext:  2 plus pulses, no edema, no cyanosis, no clubbing Skin:  No rashes no nodules Neuro:  CN II through XII intact, motor grossly intact  EKG - nsr with PVC's and ventricular pacing  DEVICE  Normal device function.  See PaceArt for details.   Assess/Plan:  1. PM lead dysfunction - her threshold is much improved after undergoing extraction and insertion of a new RV lead in the left bundle area. We turned down her outputs. 2. Atrial fib - she remains in atrial fib/flutter. Her rate is controlled. 3. CHB - she has an escape today of 35/min. We will follow. She is asymptomatic with pacing 4. Coags - she has not had bleeding and is back on her systemic anti-coagulation with Eliquis.   Miranda Overlie Keyion Knack,MD

## 2021-07-21 NOTE — Patient Instructions (Signed)
Medication Instructions:  Your physician recommends that you continue on your current medications as directed. Please refer to the Current Medication list given to you today.  *If you need a refill on your cardiac medications before your next appointment, please call your pharmacy*   Lab Work: NONE   If you have labs (blood work) drawn today and your tests are completely normal, you will receive your results only by: . MyChart Message (if you have MyChart) OR . A paper copy in the mail If you have any lab test that is abnormal or we need to change your treatment, we will call you to review the results.   Testing/Procedures: NONE    Follow-Up: At [facility], you and your health needs are our priority.  As part of our continuing mission to provide you with exceptional heart care, we have created designated Provider Care Teams.  These Care Teams include your primary Cardiologist (physician) and Advanced Practice Providers (APPs -  Physician Assistants and Nurse Practitioners) who all work together to provide you with the care you need, when you need it.  We recommend signing up for the patient portal called "MyChart".  Sign up information is provided on this After Visit Summary.  MyChart is used to connect with patients for Virtual Visits (Telemedicine).  Patients are able to view lab/test results, encounter notes, upcoming appointments, etc.  Non-urgent messages can be sent to your provider as well.   To learn more about what you can do with MyChart, go to [email or web address].    Your next appointment:   1 year(s)  The format for your next appointment:   In Person  Provider:   Gregg Taylor, MD   Other Instructions Thank you for choosing [facility]!    

## 2021-08-02 DIAGNOSIS — E1165 Type 2 diabetes mellitus with hyperglycemia: Secondary | ICD-10-CM | POA: Diagnosis not present

## 2021-08-03 NOTE — Progress Notes (Signed)
Remote pacemaker transmission.   

## 2021-08-06 DIAGNOSIS — I10 Essential (primary) hypertension: Secondary | ICD-10-CM | POA: Diagnosis not present

## 2021-08-06 DIAGNOSIS — Z299 Encounter for prophylactic measures, unspecified: Secondary | ICD-10-CM | POA: Diagnosis not present

## 2021-08-06 DIAGNOSIS — Z6839 Body mass index (BMI) 39.0-39.9, adult: Secondary | ICD-10-CM | POA: Diagnosis not present

## 2021-08-06 DIAGNOSIS — Z789 Other specified health status: Secondary | ICD-10-CM | POA: Diagnosis not present

## 2021-08-06 DIAGNOSIS — R69 Illness, unspecified: Secondary | ICD-10-CM | POA: Diagnosis not present

## 2021-08-06 DIAGNOSIS — J440 Chronic obstructive pulmonary disease with acute lower respiratory infection: Secondary | ICD-10-CM | POA: Diagnosis not present

## 2021-08-06 DIAGNOSIS — J209 Acute bronchitis, unspecified: Secondary | ICD-10-CM | POA: Diagnosis not present

## 2021-08-13 DIAGNOSIS — I10 Essential (primary) hypertension: Secondary | ICD-10-CM | POA: Diagnosis not present

## 2021-08-13 DIAGNOSIS — Z299 Encounter for prophylactic measures, unspecified: Secondary | ICD-10-CM | POA: Diagnosis not present

## 2021-08-13 DIAGNOSIS — I5030 Unspecified diastolic (congestive) heart failure: Secondary | ICD-10-CM | POA: Diagnosis not present

## 2021-08-13 DIAGNOSIS — Z789 Other specified health status: Secondary | ICD-10-CM | POA: Diagnosis not present

## 2021-08-13 DIAGNOSIS — E1122 Type 2 diabetes mellitus with diabetic chronic kidney disease: Secondary | ICD-10-CM | POA: Diagnosis not present

## 2021-08-13 DIAGNOSIS — E1165 Type 2 diabetes mellitus with hyperglycemia: Secondary | ICD-10-CM | POA: Diagnosis not present

## 2021-08-13 DIAGNOSIS — N183 Chronic kidney disease, stage 3 unspecified: Secondary | ICD-10-CM | POA: Diagnosis not present

## 2021-09-01 ENCOUNTER — Telehealth: Payer: Self-pay

## 2021-09-01 DIAGNOSIS — E1165 Type 2 diabetes mellitus with hyperglycemia: Secondary | ICD-10-CM | POA: Diagnosis not present

## 2021-09-01 MED ORDER — APIXABAN 5 MG PO TABS: 5.0000 mg | ORAL_TABLET | Freq: Two times a day (BID) | ORAL | 5 refills | Status: DC

## 2021-09-01 NOTE — Telephone Encounter (Signed)
Prescription refill request for Eliquis received. Indication: PAF Last office visit: 07/21/21  Beckie Salts MD Scr: 1.15 on 01/05/21 Age: 81 Weight: 106.2kg  Based on above findings Eliquis 5mg  twice daily is the appropriate dose.  Refill approved.

## 2021-09-01 NOTE — Telephone Encounter (Signed)
Pt walked into office to have refill sent in for her Eliquis. Pt was told she needed to be seen before refills could be sent in, no note in Epic. Will fwd to Edrick Oh, RN

## 2021-09-12 IMAGING — RF DG ESOPHAGUS
14 series · 14 of 24 positions shown · non-contrast
Comparison: None

CLINICAL DATA: Dysphagia for solids especially meats and breads,
proximal esophageal stricture post dilatation on 05/11/2019,
persistent symptoms

EXAM:
ESOPHOGRAM / BARIUM SWALLOW / BARIUM TABLET STUDY
TECHNIQUE: Combined double contrast and single contrast examination performed
using effervescent crystals, thick barium liquid, and thin barium
liquid. The patient was observed with fluoroscopy swallowing a 13 mm
barium sulphate tablet.
FLUOROSCOPY TIME:  Fluoroscopy Time:  1 minutes 36 seconds
Radiation Exposure Index (if provided by the fluoroscopic device):
45.8 mGy
Number of Acquired Spot Images: multiple fluoroscopic screen
captures

[Series 1: cp_standard · 0.17mm/px · 1 of 23 frames shown (1 of 14)]
[frame 4/23]
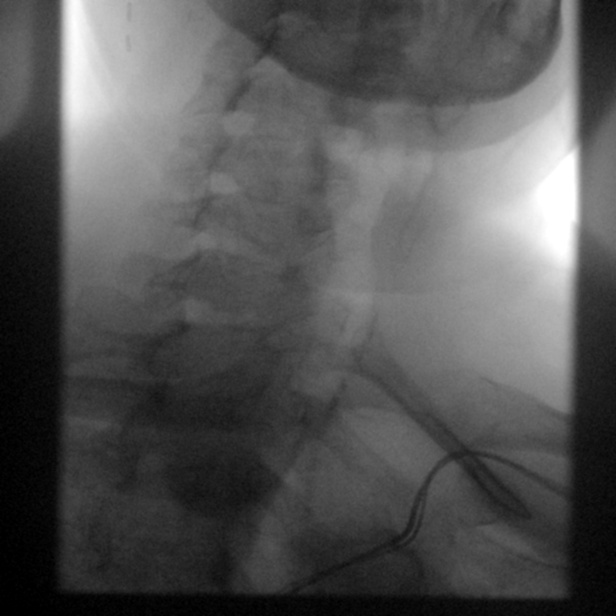

[Series 2: cp_standard · 0.17mm/px · 1 of 62 frames shown (2 of 14)]
[frame 11/62]
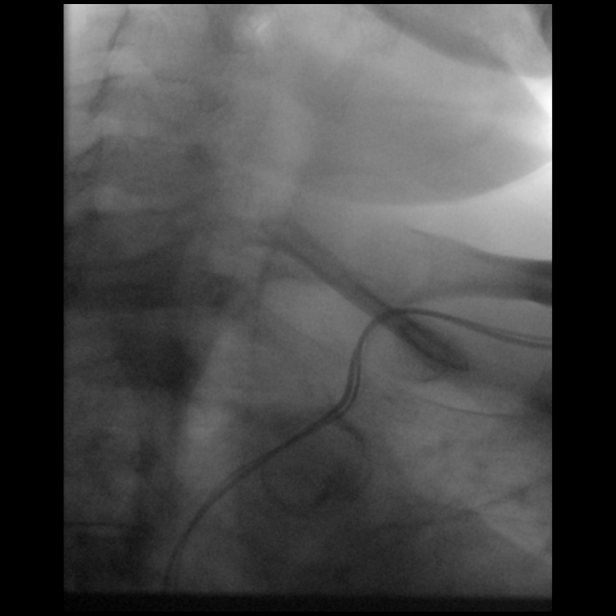

[Series 3: cp_standard · 0.17mm/px · 1 of 32 frames shown (3 of 14)]
[frame 17/32]
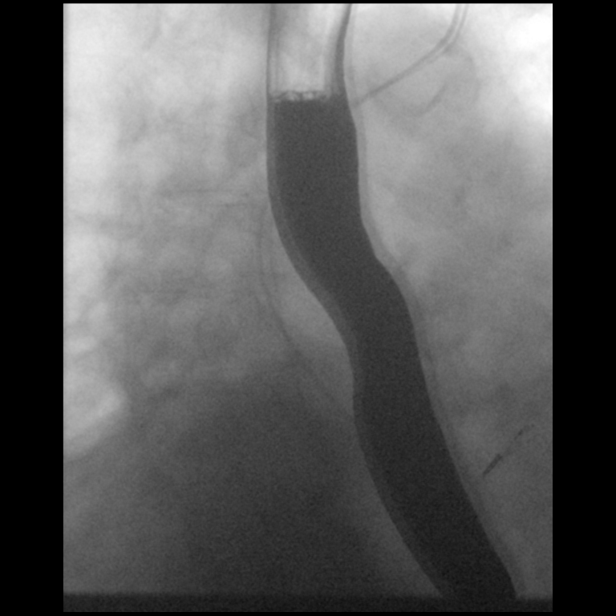

[Series 4: cp_standard · 0.17mm/px · 1 of 96 frames shown (4 of 14)]
[frame 49/96]
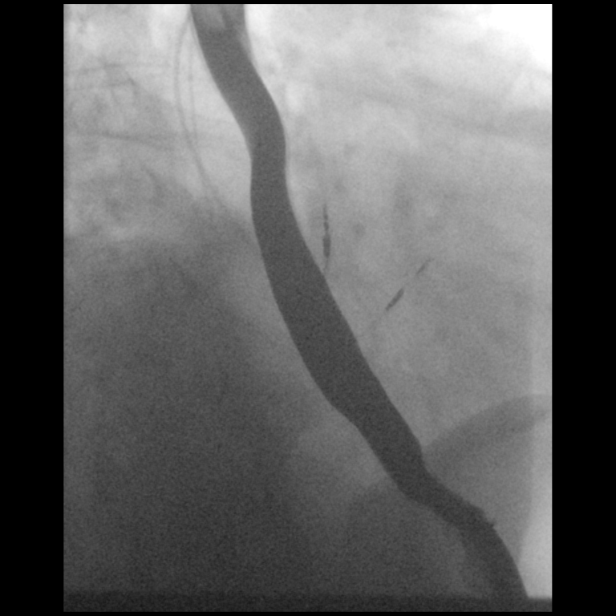

[Series 5: cp_standard · 0.17mm/px · 1 of 36 frames shown (5 of 14)]
[frame 16/36]
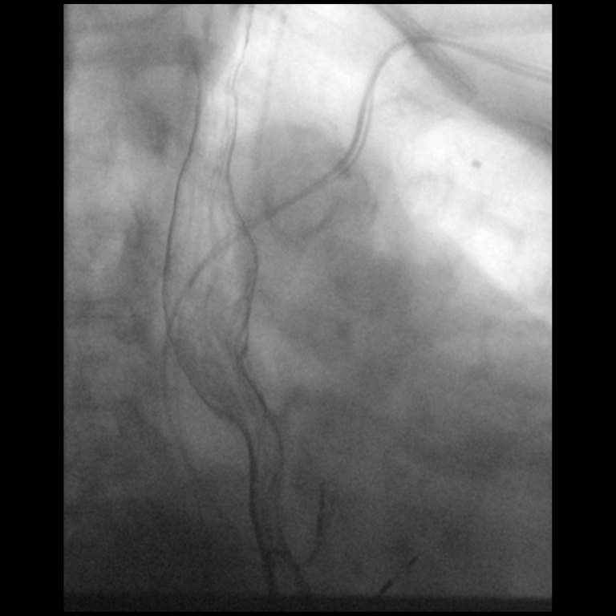

[Series 6: cp_standard · 0.17mm/px · 1 of 114 frames shown (6 of 14)]
[frame 58/114]
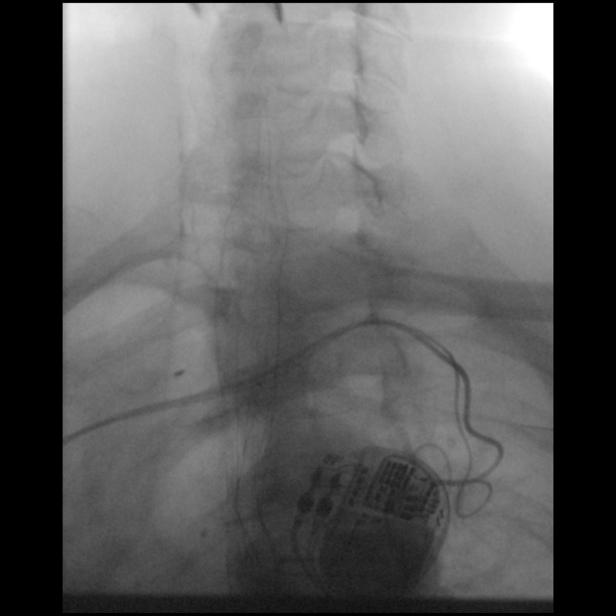

[Series 7: cp_standard · 0.17mm/px · 1 of 18 frames shown (7 of 14)]
[frame 10/18]
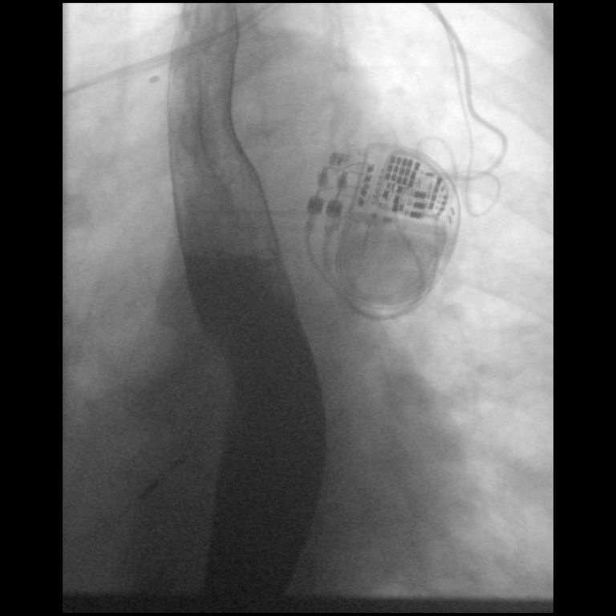

[Series 8: cp_standard · 0.17mm/px · 1 of 51 frames shown (8 of 14)]
[frame 22/51]
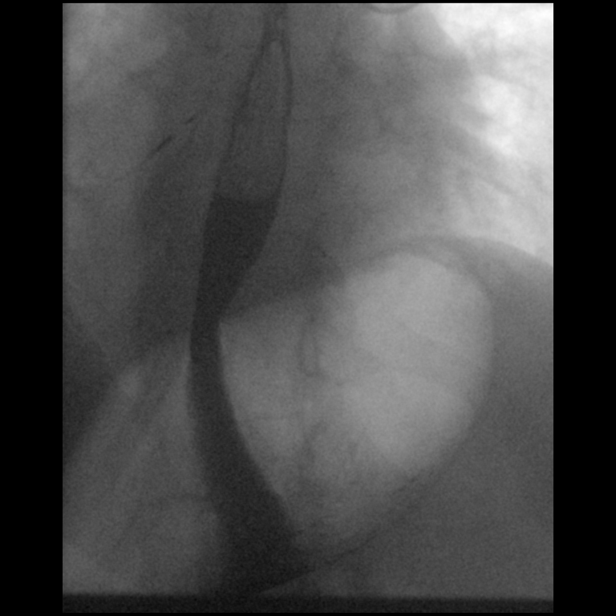

[Series 9: cp_standard · 0.27mm/px · 1 of 200 frames shown (9 of 14)]
[frame 31/200]
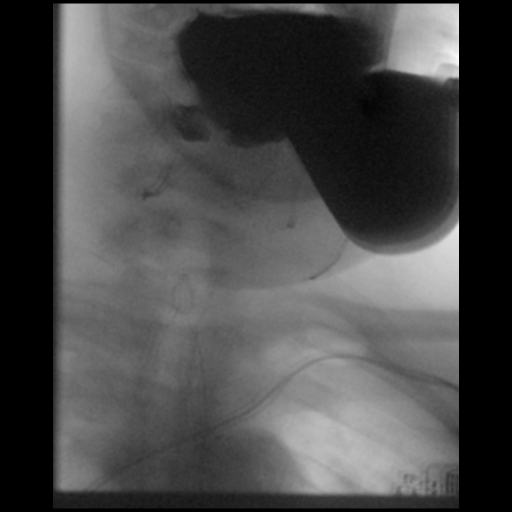

[Series 10: cp_standard · 0.25mm/px · 1 of 237 frames shown (10 of 14)]
[frame 202/237]
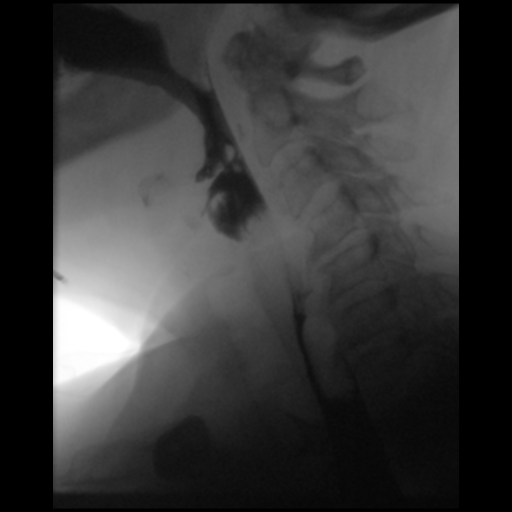

[Series 11: cp_standard · 0.18mm/px · 1 of 19 frames shown (11 of 14)]
[frame 17/19]
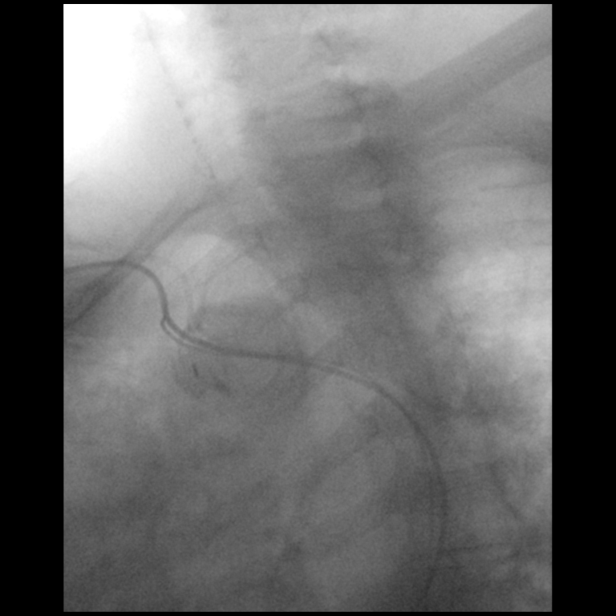

[Series 12: cp_standard · 0.18mm/px · 1 of 49 frames shown (12 of 14)]
[frame 8/49]
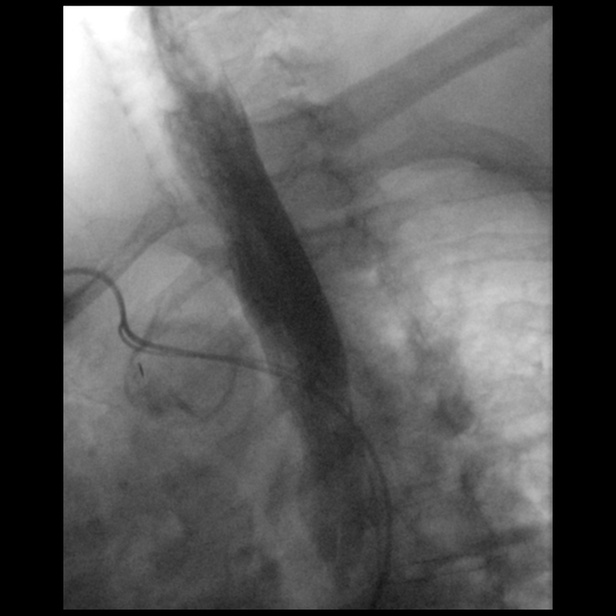

[Series 12: cp_standard · 0.19mm/px · 1 of 31 frames shown (13 of 14)]
[frame 16/31]
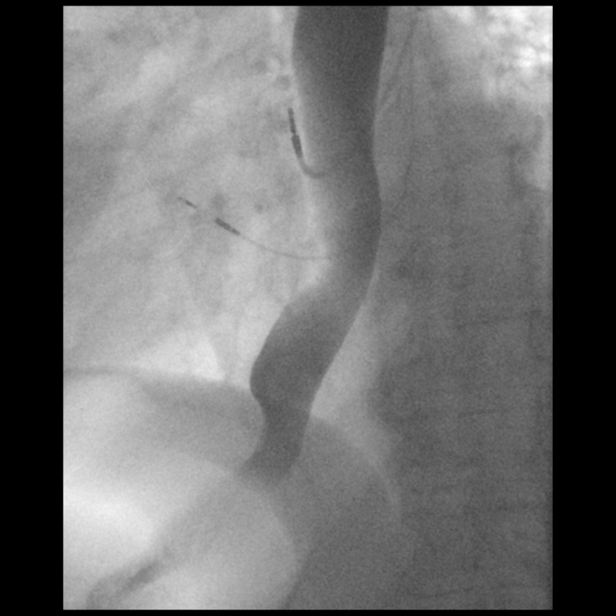

[Series 14: cp_standard · 0.19mm/px · 1 of 75 frames shown (14 of 14)]
[frame 64/75]
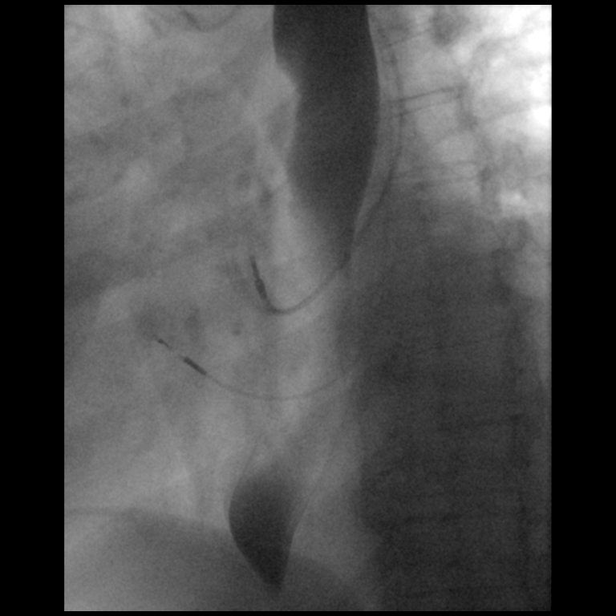

[14 of 24 positions shown; findings below may reference images not displayed]

FINDINGS: Esophageal distention: Significant narrowing of the esophagus in [facility]
greater than transverse diameter at the proximal cervical esophagus,
appears to be due to a significantly enlarged cricopharyngeus
muscle. Remaining esophagus demonstrates normal distention without
additional mass or stricture.

Filling defects:  No additional filling defects

12.5 mm barium tablet: Passed from oral cavity to stomach easily
without delay

Motility: Diffuse age-related esophageal dysmotility with incomplete
clearance of barium by primary peristaltic waves and prolonged
thoracic retention of contrast

Mucosa:  Smooth without irregularity or ulceration

Hypopharynx/cervical esophagus: Minimal vallecular and piriform
sinus residuals. Mild laryngeal penetration without aspiration.

Hiatal hernia:  Absent

GE reflux:  Not witnessed during exam

Other:  N/A
IMPRESSION: Significant narrowing of the proximal cervical esophagus in [facility]
greater than transverse dimensions secondary to a significantly
enlarged cricopharyngeus muscle.

Laryngeal penetration without aspiration.

Minimal vallecular and piriform sinus residuals.

Age-related esophageal dysmotility.

## 2021-09-15 DIAGNOSIS — H35033 Hypertensive retinopathy, bilateral: Secondary | ICD-10-CM | POA: Diagnosis not present

## 2021-09-15 DIAGNOSIS — H524 Presbyopia: Secondary | ICD-10-CM | POA: Diagnosis not present

## 2021-09-16 ENCOUNTER — Other Ambulatory Visit: Payer: Self-pay

## 2021-09-16 ENCOUNTER — Encounter: Payer: Self-pay | Admitting: Vascular Surgery

## 2021-09-16 ENCOUNTER — Ambulatory Visit (INDEPENDENT_AMBULATORY_CARE_PROVIDER_SITE_OTHER): Payer: Medicare HMO | Admitting: Vascular Surgery

## 2021-09-16 ENCOUNTER — Ambulatory Visit: Payer: Medicare HMO

## 2021-09-16 VITALS — BP 128/76 | HR 76 | Ht 66.0 in | Wt 230.0 lb

## 2021-09-16 DIAGNOSIS — I6522 Occlusion and stenosis of left carotid artery: Secondary | ICD-10-CM

## 2021-09-16 NOTE — Progress Notes (Signed)
? ? [facility] ? ?Patient name: Miranda Sexton MRN: [id number] DOB: 02-Jan-1941 Sex: female ? ?REASON FOR VISIT: Follow-up known carotid disease ? ?HPI: ?Miranda Sexton is a 81 y.o. female here today for follow-up.  She denies any new medical difficulty since her last visit.  She specifically denies any amaurosis fugax, transient ischemic attack, aphasia or stroke.  She reports that she recently had a visit with her eye doctor who had no concerns.  She is left-handed. ? ?Past Medical History:  ?Diagnosis Date  ? Anxiety   ? Asthma   ? Carotid artery occlusion   ? Chronic bronchitis ([place])   ? Chronic diastolic heart failure ([place])   ? CKD (chronic kidney disease) stage 2, GFR 60-89 ml/min   ? COPD (chronic obstructive pulmonary disease) ([place])   ? Coronary atherosclerosis of native coronary artery   ? Nonobstructive 08/2008  ? Daily headache   ? DJD (degenerative joint disease)   ? Essential hypertension   ? GERD (gastroesophageal reflux disease)   ? Hyperlipidemia   ? Obesity   ? On home oxygen therapy   ? PAD (peripheral artery disease) ([place])   ? Moderate right renal artery stenosis 08/2008  ? Paroxysmal atrial fibrillation ([facility])   ? Pneumonia   ? Presence of permanent cardiac pacemaker   ? Medtronic Advisa DR MRI SureScan model [id number] PPM 03/03/16  ? Sick sinus syndrome ([place])   ? Medtronic dual-chamber his bundle pacemaker - Dr. Rayann Heman  ? Type 2 diabetes mellitus ([place])   ? ? ?Family History  ?Problem Relation Age of Onset  ? Cancer Mother   ?     Stomach  ? Deep vein thrombosis Mother   ? Diabetes Mother   ? Hypertension Mother   ? Heart disease Mother   ? Hypertension Father   ? Diabetes Sister   ? Hyperlipidemia Sister   ? Hypertension Sister   ? Heart disease Sister   ? Diabetes Brother   ? Hyperlipidemia Brother   ? Hypertension Brother   ? ? ?SOCIAL HISTORY: ?Social History  ? ?Tobacco Use  ? Smoking status: Never  ? Smokeless tobacco: Never   ?Substance Use Topics  ? Alcohol use: No  ?  Alcohol/week: 0.0 standard drinks  ? ? ?Allergies  ?Allergen Reactions  ? Codeine Nausea And Vomiting  ? Penicillins Rash and Other (See Comments)  ?  Has patient had a PCN reaction causing immediate rash, facial/tongue/throat swelling, SOB or lightheadedness with hypotension: Yes ?Has patient had a PCN reaction causing severe rash involving mucus membranes or skin necrosis: No ?Has patient had a PCN reaction that required hospitalization No ?Has patient had a PCN reaction occurring within the last 10 years: No ?If all of the above answers are "NO", then may proceed with Cephalosporin use. ?  ? ? ?Current Outpatient Medications  ?Medication Sig Dispense Refill  ? acetaminophen (TYLENOL) 325 MG tablet Take 325 mg by mouth every 6 (six) hours as needed for mild pain or headache.    ? albuterol (VENTOLIN HFA) 108 (90 Base) MCG/ACT inhaler Inhale 2 puffs into the lungs every 6 (six) hours as needed for wheezing or shortness of breath.     ? apixaban (ELIQUIS) 5 MG TABS tablet Take 1 tablet (5 mg total) by mouth 2 (two) times daily. 60 tablet 5  ? ascorbic acid (VITAMIN C) 500 MG tablet Take 500 mg by mouth daily.    ? atorvastatin (LIPITOR) 40 MG tablet  Take 40 mg by mouth daily.    ? baclofen (LIORESAL) 10 MG tablet Take 10 mg by mouth 2 (two) times daily.    ? benazepril (LOTENSIN) 40 MG tablet Take 40 mg by mouth daily.    ? flecainide (TAMBOCOR) 50 MG tablet Take 1 tablet (50 mg total) by mouth 2 (two) times daily. 180 tablet 3  ? furosemide (LASIX) 40 MG tablet Take 80 mg by mouth daily.    ? glipiZIDE (GLUCOTROL XL) 10 MG 24 hr tablet Take 10 mg by mouth daily.     ? insulin aspart protamine- aspart (NOVOLOG 70/30) (70-30) 100 UNIT/ML injection Inject 30-46 Units into the skin See admin instructions. Inject 46 units in the morning & 30 units in the evening.    ? metoprolol succinate (TOPROL-XL) 25 MG 24 hr tablet TAKE 1/2 TABLET(12.5 MG) BY MOUTH DAILY 45 tablet 1  ?  moxifloxacin (VIGAMOX) 0.5 % ophthalmic solution Place 1 drop into both eyes in the morning and at bedtime.    ? nitroGLYCERIN (NITROSTAT) 0.4 MG SL tablet Place 1 tablet (0.4 mg total) under the tongue every 5 (five) minutes as needed for chest pain. 30 tablet 2  ? ONETOUCH VERIO test strip 2 (two) times daily.    ? potassium chloride SA (K-DUR) 20 MEQ tablet Take 1 tablet (20 mEq total) by mouth daily. 90 tablet 0  ? TRULICITY 4.40 NU/2.7OZ SOPN Inject 0.75 mg into the skin every Tuesday.    ? venlafaxine XR (EFFEXOR-XR) 150 MG 24 hr capsule Take 150 mg by mouth daily.    ? vitamin B-12 (CYANOCOBALAMIN) 50 MCG tablet Take 50 mcg by mouth daily.    ? ?No current facility-administered medications for this visit.  ? ? ?REVIEW OF SYSTEMS:  ?'[X]'$  denotes positive finding, '[ ]'$  denotes negative finding ?Cardiac  Comments:  ?Chest pain or chest pressure:    ?Shortness of breath upon exertion:    ?Short of breath when lying flat:    ?Irregular heart rhythm:    ?    ?Vascular    ?Pain in calf, thigh, or hip brought on by ambulation:    ?Pain in feet at night that wakes you up from your sleep:     ?Blood clot in your veins:    ?Leg swelling:     ?    ? ? ?PHYSICAL EXAM: ?Vitals:  ? 09/16/21 0940  ?BP: 128/76  ?Pulse: 76  ?Weight: 230 lb (104.3 kg)  ?Height: '5\' 6"'$  (1.676 m)  ? ? ?GENERAL: The patient is a well-nourished female, in no acute distress. The vital signs are documented above. ?CARDIOVASCULAR: Carotid arteries without bruits bilaterally.  2+ radial pulses bilaterally. ?PULMONARY: There is good air exchange  ?MUSCULOSKELETAL: There are no major deformities or cyanosis. ?NEUROLOGIC: No focal weakness or paresthesias are detected. ?SKIN: There are no ulcers or rashes noted. ?PSYCHIATRIC: The patient has a normal affect. ? ?DATA:  ?She underwent repeat carotid duplex in our office today.  This revealed no evidence of stenosis in her right internal carotid artery.  Left internal carotid artery is occluded. ? ?Her study  from [facility] on 01/21/2021 jested 60 to 79% stenosis in her left internal carotid artery ? ?MEDICAL ISSUES: ?Had long discussion with patient.  She apparently has had asymptomatic occlusion of her left internal carotid artery since her study in July 2022.  Discussed this at length with the patient explaining that she fortunately has had no symptoms related to this and has collateral flow  to compensate.  She knows to report immediately should she develop any neurologic deficits.  Otherwise we will see her again in 1 year for final duplex.  If this continues to be shown parsed same study we would then not continue duplex follow-up ? ? ? ?[name], MD FACS ?[facility] ?Office Tel [phone] ? ?Note: Portions of this report may have been transcribed using voice recognition software.  Every effort has been made to ensure accuracy; however, inadvertent computerized transcription errors may still be present. ?

## 2021-10-01 DIAGNOSIS — E1165 Type 2 diabetes mellitus with hyperglycemia: Secondary | ICD-10-CM | POA: Diagnosis not present

## 2021-10-07 DIAGNOSIS — R32 Unspecified urinary incontinence: Secondary | ICD-10-CM | POA: Diagnosis not present

## 2021-10-07 DIAGNOSIS — I10 Essential (primary) hypertension: Secondary | ICD-10-CM | POA: Diagnosis not present

## 2021-10-07 DIAGNOSIS — E669 Obesity, unspecified: Secondary | ICD-10-CM | POA: Diagnosis not present

## 2021-10-07 DIAGNOSIS — K219 Gastro-esophageal reflux disease without esophagitis: Secondary | ICD-10-CM | POA: Diagnosis not present

## 2021-10-07 DIAGNOSIS — Z7985 Long-term (current) use of injectable non-insulin antidiabetic drugs: Secondary | ICD-10-CM | POA: Diagnosis not present

## 2021-10-07 DIAGNOSIS — Z955 Presence of coronary angioplasty implant and graft: Secondary | ICD-10-CM | POA: Diagnosis not present

## 2021-10-07 DIAGNOSIS — R69 Illness, unspecified: Secondary | ICD-10-CM | POA: Diagnosis not present

## 2021-10-07 DIAGNOSIS — Z794 Long term (current) use of insulin: Secondary | ICD-10-CM | POA: Diagnosis not present

## 2021-10-07 DIAGNOSIS — Z880 Allergy status to penicillin: Secondary | ICD-10-CM | POA: Diagnosis not present

## 2021-10-07 DIAGNOSIS — Z7901 Long term (current) use of anticoagulants: Secondary | ICD-10-CM | POA: Diagnosis not present

## 2021-10-07 DIAGNOSIS — E785 Hyperlipidemia, unspecified: Secondary | ICD-10-CM | POA: Diagnosis not present

## 2021-10-07 DIAGNOSIS — E119 Type 2 diabetes mellitus without complications: Secondary | ICD-10-CM | POA: Diagnosis not present

## 2021-10-20 ENCOUNTER — Ambulatory Visit (INDEPENDENT_AMBULATORY_CARE_PROVIDER_SITE_OTHER): Payer: Medicare HMO

## 2021-10-20 DIAGNOSIS — I495 Sick sinus syndrome: Secondary | ICD-10-CM

## 2021-10-20 LAB — CUP PACEART REMOTE DEVICE CHECK
Battery Remaining Longevity: 143 mo
Battery Voltage: 3.07 V
Brady Statistic AP VP Percent: 0 %
Brady Statistic AP VS Percent: 0 %
Brady Statistic AS VP Percent: 97.31 %
Brady Statistic AS VS Percent: 2.69 %
Brady Statistic RA Percent Paced: 0 %
Brady Statistic RV Percent Paced: 97.31 %
Date Time Interrogation Session: 20230418045301
Implantable Lead Implant Date: 20170829
Implantable Lead Location: 753859
Implantable Lead Model: 5076
Implantable Pulse Generator Implant Date: 20211216
Lead Channel Impedance Value: 266 Ohm
Lead Channel Impedance Value: 380 Ohm
Lead Channel Impedance Value: 437 Ohm
Lead Channel Impedance Value: 589 Ohm
Lead Channel Pacing Threshold Amplitude: 0.5 V
Lead Channel Pacing Threshold Pulse Width: 0.4 ms
Lead Channel Sensing Intrinsic Amplitude: 0.375 mV
Lead Channel Sensing Intrinsic Amplitude: 24.25 mV
Lead Channel Sensing Intrinsic Amplitude: 24.25 mV
Lead Channel Setting Pacing Amplitude: 2 V
Lead Channel Setting Pacing Pulse Width: 0.4 ms
Lead Channel Setting Sensing Sensitivity: 1.2 mV

## 2021-11-01 DIAGNOSIS — E1165 Type 2 diabetes mellitus with hyperglycemia: Secondary | ICD-10-CM | POA: Diagnosis not present

## 2021-11-06 NOTE — Progress Notes (Signed)
Remote pacemaker transmission.   

## 2021-12-07 ENCOUNTER — Other Ambulatory Visit: Payer: Self-pay | Admitting: Cardiology

## 2021-12-21 DIAGNOSIS — I10 Essential (primary) hypertension: Secondary | ICD-10-CM | POA: Diagnosis not present

## 2021-12-21 DIAGNOSIS — I4891 Unspecified atrial fibrillation: Secondary | ICD-10-CM | POA: Diagnosis not present

## 2021-12-21 DIAGNOSIS — M25562 Pain in left knee: Secondary | ICD-10-CM | POA: Diagnosis not present

## 2021-12-21 DIAGNOSIS — J449 Chronic obstructive pulmonary disease, unspecified: Secondary | ICD-10-CM | POA: Diagnosis not present

## 2021-12-21 DIAGNOSIS — Z299 Encounter for prophylactic measures, unspecified: Secondary | ICD-10-CM | POA: Diagnosis not present

## 2021-12-31 DIAGNOSIS — E1165 Type 2 diabetes mellitus with hyperglycemia: Secondary | ICD-10-CM | POA: Diagnosis not present

## 2022-01-19 ENCOUNTER — Ambulatory Visit (INDEPENDENT_AMBULATORY_CARE_PROVIDER_SITE_OTHER): Payer: Medicare HMO

## 2022-01-19 DIAGNOSIS — I495 Sick sinus syndrome: Secondary | ICD-10-CM | POA: Diagnosis not present

## 2022-01-19 LAB — CUP PACEART REMOTE DEVICE CHECK
Battery Remaining Longevity: 142 mo
Battery Voltage: 3.05 V
Brady Statistic AP VP Percent: 0 %
Brady Statistic AP VS Percent: 0 %
Brady Statistic AS VP Percent: 99.5 %
Brady Statistic AS VS Percent: 0.5 %
Brady Statistic RA Percent Paced: 0 %
Brady Statistic RV Percent Paced: 99.5 %
Date Time Interrogation Session: 20230717230109
Implantable Lead Implant Date: 20170829
Implantable Lead Location: 753859
Implantable Lead Model: 5076
Implantable Pulse Generator Implant Date: 20211216
Lead Channel Impedance Value: 266 Ohm
Lead Channel Impedance Value: 380 Ohm
Lead Channel Impedance Value: 437 Ohm
Lead Channel Impedance Value: 646 Ohm
Lead Channel Pacing Threshold Amplitude: 0.625 V
Lead Channel Pacing Threshold Pulse Width: 0.4 ms
Lead Channel Sensing Intrinsic Amplitude: 0.375 mV
Lead Channel Sensing Intrinsic Amplitude: 26.5 mV
Lead Channel Sensing Intrinsic Amplitude: 26.5 mV
Lead Channel Setting Pacing Amplitude: 2 V
Lead Channel Setting Pacing Pulse Width: 0.4 ms
Lead Channel Setting Sensing Sensitivity: 1.2 mV

## 2022-02-16 NOTE — Progress Notes (Signed)
Remote pacemaker transmission.   

## 2022-02-23 DIAGNOSIS — I10 Essential (primary) hypertension: Secondary | ICD-10-CM | POA: Diagnosis not present

## 2022-02-23 DIAGNOSIS — I509 Heart failure, unspecified: Secondary | ICD-10-CM | POA: Diagnosis not present

## 2022-02-23 DIAGNOSIS — E1165 Type 2 diabetes mellitus with hyperglycemia: Secondary | ICD-10-CM | POA: Diagnosis not present

## 2022-02-23 DIAGNOSIS — D6869 Other thrombophilia: Secondary | ICD-10-CM | POA: Diagnosis not present

## 2022-02-23 DIAGNOSIS — Z299 Encounter for prophylactic measures, unspecified: Secondary | ICD-10-CM | POA: Diagnosis not present

## 2022-03-03 DIAGNOSIS — E1165 Type 2 diabetes mellitus with hyperglycemia: Secondary | ICD-10-CM | POA: Diagnosis not present

## 2022-03-04 DIAGNOSIS — I10 Essential (primary) hypertension: Secondary | ICD-10-CM | POA: Diagnosis not present

## 2022-03-04 DIAGNOSIS — Z79899 Other long term (current) drug therapy: Secondary | ICD-10-CM | POA: Diagnosis not present

## 2022-03-04 DIAGNOSIS — Z299 Encounter for prophylactic measures, unspecified: Secondary | ICD-10-CM | POA: Diagnosis not present

## 2022-03-04 DIAGNOSIS — R5383 Other fatigue: Secondary | ICD-10-CM | POA: Diagnosis not present

## 2022-03-04 DIAGNOSIS — Z1331 Encounter for screening for depression: Secondary | ICD-10-CM | POA: Diagnosis not present

## 2022-03-04 DIAGNOSIS — Z1339 Encounter for screening examination for other mental health and behavioral disorders: Secondary | ICD-10-CM | POA: Diagnosis not present

## 2022-03-04 DIAGNOSIS — Z0000 Encounter for general adult medical examination without abnormal findings: Secondary | ICD-10-CM | POA: Diagnosis not present

## 2022-03-04 DIAGNOSIS — Z7189 Other specified counseling: Secondary | ICD-10-CM | POA: Diagnosis not present

## 2022-03-04 DIAGNOSIS — E7800 Pure hypercholesterolemia, unspecified: Secondary | ICD-10-CM | POA: Diagnosis not present

## 2022-03-04 DIAGNOSIS — Z6838 Body mass index (BMI) 38.0-38.9, adult: Secondary | ICD-10-CM | POA: Diagnosis not present

## 2022-03-25 ENCOUNTER — Other Ambulatory Visit: Payer: Self-pay | Admitting: Cardiology

## 2022-03-25 NOTE — Telephone Encounter (Signed)
Prescription refill request for Eliquis received. Indication: afib  Last office visit: 07/09/2021, [place] Scr:  1.21, 03/04/2022 Age: 81 yo Weight: 104 kg   Refill sent.

## 2022-04-02 DIAGNOSIS — E1165 Type 2 diabetes mellitus with hyperglycemia: Secondary | ICD-10-CM | POA: Diagnosis not present

## 2022-04-12 DIAGNOSIS — J449 Chronic obstructive pulmonary disease, unspecified: Secondary | ICD-10-CM | POA: Diagnosis not present

## 2022-04-20 ENCOUNTER — Telehealth: Payer: Self-pay | Admitting: Cardiovascular Disease

## 2022-04-20 ENCOUNTER — Ambulatory Visit (INDEPENDENT_AMBULATORY_CARE_PROVIDER_SITE_OTHER): Payer: Medicare HMO

## 2022-04-20 DIAGNOSIS — I495 Sick sinus syndrome: Secondary | ICD-10-CM

## 2022-04-20 LAB — CUP PACEART REMOTE DEVICE CHECK
Battery Remaining Longevity: 139 mo
Battery Voltage: 3.04 V
Brady Statistic AP VP Percent: 0 %
Brady Statistic AP VS Percent: 0 %
Brady Statistic AS VP Percent: 98.71 %
Brady Statistic AS VS Percent: 1.29 %
Brady Statistic RA Percent Paced: 0 %
Brady Statistic RV Percent Paced: 98.71 %
Date Time Interrogation Session: 20231016215504
Implantable Lead Implant Date: 20170829
Implantable Lead Location: 753859
Implantable Lead Model: 5076
Implantable Pulse Generator Implant Date: 20211216
Lead Channel Impedance Value: 266 Ohm
Lead Channel Impedance Value: 380 Ohm
Lead Channel Impedance Value: 475 Ohm
Lead Channel Impedance Value: 646 Ohm
Lead Channel Pacing Threshold Amplitude: 0.625 V
Lead Channel Pacing Threshold Pulse Width: 0.4 ms
Lead Channel Sensing Intrinsic Amplitude: 0.375 mV
Lead Channel Sensing Intrinsic Amplitude: 27 mV
Lead Channel Sensing Intrinsic Amplitude: 27 mV
Lead Channel Setting Pacing Amplitude: 2 V
Lead Channel Setting Pacing Pulse Width: 0.4 ms
Lead Channel Setting Sensing Sensitivity: 1.2 mV

## 2022-04-20 NOTE — Telephone Encounter (Signed)
Returned call to Pt.  Advised we had received her transmission.

## 2022-04-20 NOTE — Telephone Encounter (Signed)
  1. Has your device fired? No   2. Is you device beeping? No   3. Are you experiencing draining or swelling at device site? No   4. Are you calling to see if we received your device transmission? Yes  5. Have you passed out? No     Please route to Device Clinic Pool  

## 2022-05-03 DIAGNOSIS — E1165 Type 2 diabetes mellitus with hyperglycemia: Secondary | ICD-10-CM | POA: Diagnosis not present

## 2022-05-04 NOTE — Progress Notes (Signed)
Remote pacemaker transmission.   

## 2022-05-13 DIAGNOSIS — J449 Chronic obstructive pulmonary disease, unspecified: Secondary | ICD-10-CM | POA: Diagnosis not present

## 2022-05-31 DIAGNOSIS — R0689 Other abnormalities of breathing: Secondary | ICD-10-CM | POA: Diagnosis not present

## 2022-05-31 DIAGNOSIS — R001 Bradycardia, unspecified: Secondary | ICD-10-CM | POA: Diagnosis not present

## 2022-05-31 DIAGNOSIS — I959 Hypotension, unspecified: Secondary | ICD-10-CM | POA: Diagnosis not present

## 2022-05-31 DIAGNOSIS — R064 Hyperventilation: Secondary | ICD-10-CM | POA: Diagnosis not present

## 2022-05-31 DIAGNOSIS — R69 Illness, unspecified: Secondary | ICD-10-CM | POA: Diagnosis not present

## 2022-06-01 ENCOUNTER — Other Ambulatory Visit: Payer: Self-pay | Admitting: Cardiology

## 2022-06-02 DIAGNOSIS — E1165 Type 2 diabetes mellitus with hyperglycemia: Secondary | ICD-10-CM | POA: Diagnosis not present

## 2022-06-10 DIAGNOSIS — J449 Chronic obstructive pulmonary disease, unspecified: Secondary | ICD-10-CM | POA: Diagnosis not present

## 2022-06-18 DIAGNOSIS — I10 Essential (primary) hypertension: Secondary | ICD-10-CM | POA: Diagnosis not present

## 2022-06-18 DIAGNOSIS — E1165 Type 2 diabetes mellitus with hyperglycemia: Secondary | ICD-10-CM | POA: Diagnosis not present

## 2022-06-18 DIAGNOSIS — Z789 Other specified health status: Secondary | ICD-10-CM | POA: Diagnosis not present

## 2022-06-18 DIAGNOSIS — Z299 Encounter for prophylactic measures, unspecified: Secondary | ICD-10-CM | POA: Diagnosis not present

## 2022-07-02 DIAGNOSIS — E1165 Type 2 diabetes mellitus with hyperglycemia: Secondary | ICD-10-CM | POA: Diagnosis not present

## 2022-07-02 DIAGNOSIS — J209 Acute bronchitis, unspecified: Secondary | ICD-10-CM | POA: Diagnosis not present

## 2022-07-02 DIAGNOSIS — Z299 Encounter for prophylactic measures, unspecified: Secondary | ICD-10-CM | POA: Diagnosis not present

## 2022-07-02 DIAGNOSIS — J440 Chronic obstructive pulmonary disease with acute lower respiratory infection: Secondary | ICD-10-CM | POA: Diagnosis not present

## 2022-07-11 DIAGNOSIS — J449 Chronic obstructive pulmonary disease, unspecified: Secondary | ICD-10-CM | POA: Diagnosis not present

## 2022-07-15 DIAGNOSIS — I700 Atherosclerosis of aorta: Secondary | ICD-10-CM | POA: Diagnosis not present

## 2022-07-15 DIAGNOSIS — I509 Heart failure, unspecified: Secondary | ICD-10-CM | POA: Diagnosis not present

## 2022-07-15 DIAGNOSIS — Z299 Encounter for prophylactic measures, unspecified: Secondary | ICD-10-CM | POA: Diagnosis not present

## 2022-07-15 DIAGNOSIS — I10 Essential (primary) hypertension: Secondary | ICD-10-CM | POA: Diagnosis not present

## 2022-07-15 DIAGNOSIS — N939 Abnormal uterine and vaginal bleeding, unspecified: Secondary | ICD-10-CM | POA: Diagnosis not present

## 2022-07-20 ENCOUNTER — Ambulatory Visit: Payer: Medicare HMO | Attending: Cardiovascular Disease

## 2022-07-20 DIAGNOSIS — I495 Sick sinus syndrome: Secondary | ICD-10-CM

## 2022-07-20 LAB — CUP PACEART REMOTE DEVICE CHECK
Battery Remaining Longevity: 135 mo
Battery Voltage: 3.03 V
Brady Statistic AP VP Percent: 0 %
Brady Statistic AP VS Percent: 0 %
Brady Statistic AS VP Percent: 99.7 %
Brady Statistic AS VS Percent: 0.3 %
Brady Statistic RA Percent Paced: 0 %
Brady Statistic RV Percent Paced: 99.7 %
Date Time Interrogation Session: 20240116004405
Implantable Lead Connection Status: 753985
Implantable Lead Implant Date: 20170829
Implantable Lead Location: 753859
Implantable Lead Model: 5076
Implantable Pulse Generator Implant Date: 20211216
Lead Channel Impedance Value: 266 Ohm
Lead Channel Impedance Value: 380 Ohm
Lead Channel Impedance Value: 418 Ohm
Lead Channel Impedance Value: 608 Ohm
Lead Channel Pacing Threshold Amplitude: 0.625 V
Lead Channel Pacing Threshold Pulse Width: 0.4 ms
Lead Channel Sensing Intrinsic Amplitude: 0.375 mV
Lead Channel Sensing Intrinsic Amplitude: 23.875 mV
Lead Channel Sensing Intrinsic Amplitude: 23.875 mV
Lead Channel Setting Pacing Amplitude: 2 V
Lead Channel Setting Pacing Pulse Width: 0.4 ms
Lead Channel Setting Sensing Sensitivity: 1.2 mV
Zone Setting Status: 755011

## 2022-08-02 DIAGNOSIS — E1165 Type 2 diabetes mellitus with hyperglycemia: Secondary | ICD-10-CM | POA: Diagnosis not present

## 2022-08-13 NOTE — Progress Notes (Signed)
Remote pacemaker transmission.   

## 2022-08-16 DIAGNOSIS — J449 Chronic obstructive pulmonary disease, unspecified: Secondary | ICD-10-CM | POA: Diagnosis not present

## 2022-08-17 DIAGNOSIS — N950 Postmenopausal bleeding: Secondary | ICD-10-CM | POA: Diagnosis not present

## 2022-08-22 DIAGNOSIS — Z880 Allergy status to penicillin: Secondary | ICD-10-CM | POA: Diagnosis not present

## 2022-08-22 DIAGNOSIS — J449 Chronic obstructive pulmonary disease, unspecified: Secondary | ICD-10-CM | POA: Diagnosis not present

## 2022-08-22 DIAGNOSIS — N939 Abnormal uterine and vaginal bleeding, unspecified: Secondary | ICD-10-CM | POA: Diagnosis not present

## 2022-08-22 DIAGNOSIS — Z7951 Long term (current) use of inhaled steroids: Secondary | ICD-10-CM | POA: Diagnosis not present

## 2022-08-22 DIAGNOSIS — Z794 Long term (current) use of insulin: Secondary | ICD-10-CM | POA: Diagnosis not present

## 2022-08-22 DIAGNOSIS — E119 Type 2 diabetes mellitus without complications: Secondary | ICD-10-CM | POA: Diagnosis not present

## 2022-08-22 DIAGNOSIS — Z7984 Long term (current) use of oral hypoglycemic drugs: Secondary | ICD-10-CM | POA: Diagnosis not present

## 2022-08-22 DIAGNOSIS — I110 Hypertensive heart disease with heart failure: Secondary | ICD-10-CM | POA: Diagnosis not present

## 2022-08-22 DIAGNOSIS — Z885 Allergy status to narcotic agent status: Secondary | ICD-10-CM | POA: Diagnosis not present

## 2022-08-22 DIAGNOSIS — I509 Heart failure, unspecified: Secondary | ICD-10-CM | POA: Diagnosis not present

## 2022-08-22 DIAGNOSIS — R310 Gross hematuria: Secondary | ICD-10-CM | POA: Diagnosis not present

## 2022-08-23 DIAGNOSIS — N950 Postmenopausal bleeding: Secondary | ICD-10-CM | POA: Diagnosis not present

## 2022-08-24 DIAGNOSIS — R87611 Atypical squamous cells cannot exclude high grade squamous intraepithelial lesion on cytologic smear of cervix (ASC-H): Secondary | ICD-10-CM | POA: Diagnosis not present

## 2022-08-24 DIAGNOSIS — N950 Postmenopausal bleeding: Secondary | ICD-10-CM | POA: Diagnosis not present

## 2022-08-24 DIAGNOSIS — R9389 Abnormal findings on diagnostic imaging of other specified body structures: Secondary | ICD-10-CM | POA: Diagnosis not present

## 2022-08-31 DIAGNOSIS — D6869 Other thrombophilia: Secondary | ICD-10-CM | POA: Diagnosis not present

## 2022-08-31 DIAGNOSIS — I4892 Unspecified atrial flutter: Secondary | ICD-10-CM | POA: Diagnosis not present

## 2022-08-31 DIAGNOSIS — I4891 Unspecified atrial fibrillation: Secondary | ICD-10-CM | POA: Diagnosis not present

## 2022-08-31 DIAGNOSIS — Z299 Encounter for prophylactic measures, unspecified: Secondary | ICD-10-CM | POA: Diagnosis not present

## 2022-08-31 DIAGNOSIS — I10 Essential (primary) hypertension: Secondary | ICD-10-CM | POA: Diagnosis not present

## 2022-09-01 DIAGNOSIS — E1165 Type 2 diabetes mellitus with hyperglycemia: Secondary | ICD-10-CM | POA: Diagnosis not present

## 2022-09-10 ENCOUNTER — Other Ambulatory Visit: Payer: Self-pay | Admitting: Cardiology

## 2022-09-10 NOTE — Telephone Encounter (Signed)
Pt last saw Dr Lovena Le 07/21/21, pt is overdue for follow-up.  Pt is scheduled to see Dr Lovena Le on 10/12/22.  Last labs 08/22/22 Creat 1.14, age 82, weight 104.3kg, based on specified criteria pt is on appropriate dosage of Eliquis '5mg'$  BID for afib.  Will refill rx.

## 2022-09-15 DIAGNOSIS — J449 Chronic obstructive pulmonary disease, unspecified: Secondary | ICD-10-CM | POA: Diagnosis not present

## 2022-09-23 DIAGNOSIS — H524 Presbyopia: Secondary | ICD-10-CM | POA: Diagnosis not present

## 2022-09-23 DIAGNOSIS — H40013 Open angle with borderline findings, low risk, bilateral: Secondary | ICD-10-CM | POA: Diagnosis not present

## 2022-09-24 DIAGNOSIS — I10 Essential (primary) hypertension: Secondary | ICD-10-CM | POA: Diagnosis not present

## 2022-09-24 DIAGNOSIS — Z299 Encounter for prophylactic measures, unspecified: Secondary | ICD-10-CM | POA: Diagnosis not present

## 2022-09-24 DIAGNOSIS — E1165 Type 2 diabetes mellitus with hyperglycemia: Secondary | ICD-10-CM | POA: Diagnosis not present

## 2022-09-24 DIAGNOSIS — I700 Atherosclerosis of aorta: Secondary | ICD-10-CM | POA: Diagnosis not present

## 2022-09-24 DIAGNOSIS — I5030 Unspecified diastolic (congestive) heart failure: Secondary | ICD-10-CM | POA: Diagnosis not present

## 2022-09-24 DIAGNOSIS — Z6839 Body mass index (BMI) 39.0-39.9, adult: Secondary | ICD-10-CM | POA: Diagnosis not present

## 2022-10-02 DIAGNOSIS — E1165 Type 2 diabetes mellitus with hyperglycemia: Secondary | ICD-10-CM | POA: Diagnosis not present

## 2022-10-12 ENCOUNTER — Ambulatory Visit: Payer: Medicare HMO | Attending: Internal Medicine | Admitting: Internal Medicine

## 2022-10-12 ENCOUNTER — Encounter: Payer: Self-pay | Admitting: Internal Medicine

## 2022-10-12 VITALS — BP 148/74 | HR 92 | Ht 65.0 in | Wt 234.0 lb

## 2022-10-12 DIAGNOSIS — I482 Chronic atrial fibrillation, unspecified: Secondary | ICD-10-CM

## 2022-10-12 DIAGNOSIS — I480 Paroxysmal atrial fibrillation: Secondary | ICD-10-CM | POA: Diagnosis not present

## 2022-10-12 LAB — CUP PACEART INCLINIC DEVICE CHECK
Battery Remaining Longevity: 121 mo
Battery Voltage: 3.03 V
Brady Statistic AP VP Percent: 0 %
Brady Statistic AP VS Percent: 0 %
Brady Statistic AS VP Percent: 99 %
Brady Statistic AS VS Percent: 1 %
Brady Statistic RA Percent Paced: 0 %
Brady Statistic RV Percent Paced: 99 %
Date Time Interrogation Session: 20240409110051
Implantable Lead Connection Status: 753985
Implantable Lead Implant Date: 20170829
Implantable Lead Location: 753859
Implantable Lead Model: 5076
Implantable Pulse Generator Implant Date: 20211216
Lead Channel Impedance Value: 285 Ohm
Lead Channel Impedance Value: 380 Ohm
Lead Channel Impedance Value: 437 Ohm
Lead Channel Impedance Value: 646 Ohm
Lead Channel Pacing Threshold Amplitude: 0.75 V
Lead Channel Pacing Threshold Amplitude: 1 V
Lead Channel Pacing Threshold Pulse Width: 0.4 ms
Lead Channel Pacing Threshold Pulse Width: 0.4 ms
Lead Channel Sensing Intrinsic Amplitude: 0.375 mV
Lead Channel Sensing Intrinsic Amplitude: 26 mV
Lead Channel Sensing Intrinsic Amplitude: 29 mV
Lead Channel Setting Pacing Amplitude: 2.5 V
Lead Channel Setting Pacing Pulse Width: 0.4 ms
Lead Channel Setting Sensing Sensitivity: 1.2 mV
Zone Setting Status: 755011

## 2022-10-12 NOTE — Patient Instructions (Signed)
Medication Instructions:   Stop Taking Flecainide   *If you need a refill on your cardiac medications before your next appointment, please call your pharmacy*   Lab Work: NONE   If you have labs (blood work) drawn today and your tests are completely normal, you will receive your results only by: MyChart Message (if you have MyChart) OR A paper copy in the mail If you have any lab test that is abnormal or we need to change your treatment, we will call you to review the results.   Testing/Procedures: NONE    Follow-Up: At [facility], you and your health needs are our priority.  As part of our continuing mission to provide you with exceptional heart care, we have created designated Provider Care Teams.  These Care Teams include your primary Cardiologist (physician) and Advanced Practice Providers (APPs -  Physician Assistants and Nurse Practitioners) who all work together to provide you with the care you need, when you need it.  We recommend signing up for the patient portal called "MyChart".  Sign up information is provided on this After Visit Summary.  MyChart is used to connect with patients for Virtual Visits (Telemedicine).  Patients are able to view lab/test results, encounter notes, upcoming appointments, etc.  Non-urgent messages can be sent to your provider as well.   To learn more about what you can do with MyChart, go to [email or web address].    Your next appointment:   1 year(s)  Provider:   Lewayne Bunting, MD    Other Instructions Thank you for choosing [facility]!

## 2022-10-12 NOTE — Progress Notes (Signed)
HPI Miranda Sexton returns today for ongoing PM followup.  She is a pleasant 82 yo woman with CHB, s/p PPM insertion. She has a his bundle lead position and has been found to have increasing thresholds. She underwent PM gen change out and removal of the old RV lead and insertion of a new RV lead in the left bundle area. In the interim, she notes no chest pain, sob, or fever. No difficulty healing.  Allergies  Allergen Reactions   Codeine Nausea And Vomiting   Penicillins Rash and Other (See Comments)    Has patient had a PCN reaction causing immediate rash, facial/tongue/throat swelling, SOB or lightheadedness with hypotension: Yes Has patient had a PCN reaction causing severe rash involving mucus membranes or skin necrosis: No Has patient had a PCN reaction that required hospitalization No Has patient had a PCN reaction occurring within the last 10 years: No If all of the above answers are "NO", then may proceed with Cephalosporin use.      Current Outpatient Medications  Medication Sig Dispense Refill   acetaminophen (TYLENOL) 325 MG tablet Take 325 mg by mouth every 6 (six) hours as needed for mild pain or headache.     albuterol (VENTOLIN HFA) 108 (90 Base) MCG/ACT inhaler Inhale 2 puffs into the lungs every 6 (six) hours as needed for wheezing or shortness of breath.      ascorbic acid (VITAMIN C) 500 MG tablet Take 500 mg by mouth daily.     atorvastatin (LIPITOR) 40 MG tablet Take 40 mg by mouth daily.     baclofen (LIORESAL) 10 MG tablet Take 10 mg by mouth 2 (two) times daily.     benazepril (LOTENSIN) 40 MG tablet Take 40 mg by mouth daily.     ELIQUIS 5 MG TABS tablet TAKE 1 TABLET(5 MG) BY MOUTH TWICE DAILY 60 tablet 5   flecainide (TAMBOCOR) 50 MG tablet Take 1 tablet (50 mg total) by mouth 2 (two) times daily. 180 tablet 3   furosemide (LASIX) 40 MG tablet Take 80 mg by mouth daily.     glipiZIDE (GLUCOTROL XL) 10 MG 24 hr tablet Take 10 mg by mouth daily.      insulin  aspart protamine- aspart (NOVOLOG 70/30) (70-30) 100 UNIT/ML injection Inject 30-46 Units into the skin See admin instructions. Inject 46 units in the morning & 30 units in the evening.     metoprolol succinate (TOPROL-XL) 25 MG 24 hr tablet TAKE 1/2 TABLET(12.5 MG) BY MOUTH DAILY 45 tablet 2   moxifloxacin (VIGAMOX) 0.5 % ophthalmic solution Place 1 drop into both eyes in the morning and at bedtime.     nitroGLYCERIN (NITROSTAT) 0.4 MG SL tablet Place 1 tablet (0.4 mg total) under the tongue every 5 (five) minutes as needed for chest pain. 30 tablet 2   ONETOUCH VERIO test strip 2 (two) times daily.     potassium chloride SA (K-DUR) 20 MEQ tablet Take 1 tablet (20 mEq total) by mouth daily. 90 tablet 0   TRULICITY 0.75 MG/0.5ML SOPN Inject 0.75 mg into the skin every Tuesday.     venlafaxine XR (EFFEXOR-XR) 150 MG 24 hr capsule Take 150 mg by mouth daily.     vitamin B-12 (CYANOCOBALAMIN) 50 MCG tablet Take 50 mcg by mouth daily.     No current facility-administered medications for this visit.     Past Medical History:  Diagnosis Date   Anxiety    Asthma    Carotid  artery occlusion    Chronic bronchitis    Chronic diastolic heart failure    CKD (chronic kidney disease) stage 2, GFR 60-89 ml/min    COPD (chronic obstructive pulmonary disease)    Coronary atherosclerosis of native coronary artery    Nonobstructive 08/2008   Daily headache    DJD (degenerative joint disease)    Essential hypertension    GERD (gastroesophageal reflux disease)    Hyperlipidemia    Obesity    On home oxygen therapy    PAD (peripheral artery disease)    Moderate right renal artery stenosis 08/2008   Paroxysmal atrial fibrillation    Pneumonia    Presence of permanent cardiac pacemaker    Medtronic Advisa DR MRI SureScan model [id number] PPM 03/03/16   Sick sinus syndrome    Medtronic dual-chamber his bundle pacemaker - Dr. Johney FrameAllred   Type 2 diabetes mellitus     ROS:   All systems reviewed and negative  except as noted in the HPI.   Past Surgical History:  Procedure Laterality Date   BIOPSY  08/03/2017   Procedure: BIOPSY;  Surgeon: Malissa Hippoehman, Najeeb U, MD;  Location: [facility];  Service: Endoscopy;;  gastric    CARDIAC CATHETERIZATION  06/20/2018   CATARACT EXTRACTION W/ INTRAOCULAR LENS  IMPLANT, BILATERAL Bilateral 2008   COLONOSCOPY N/A 08/03/2017   Procedure: COLONOSCOPY;  Surgeon: Malissa Hippoehman, Najeeb U, MD;  Location: [facility];  Service: Endoscopy;  Laterality: N/A;   ENDARTERECTOMY Right 12/08/2012   Procedure: ENDARTERECTOMY CAROTID;  Surgeon: Larina Earthlyodd F Early, MD;  [facility];  Service: Vascular;  Laterality: Right;   EP IMPLANTABLE DEVICE N/A 03/02/2016   Procedure: Pacemaker Implant;  Surgeon: Hillis RangeJames Allred, MD;  [facility];  Service: Cardiovascular;  Laterality: N/A;   ESOPHAGOGASTRODUODENOSCOPY N/A 08/03/2017   Procedure: ESOPHAGOGASTRODUODENOSCOPY (EGD);  Surgeon: Malissa Hippoehman, Najeeb U, MD;  Location: [facility];  Service: Endoscopy;  Laterality: N/A;   ESOPHAGOGASTRODUODENOSCOPY (EGD) WITH PROPOFOL N/A 05/11/2019   Procedure: ESOPHAGOGASTRODUODENOSCOPY (EGD) WITH PROPOFOL;  Surgeon: Malissa Hippoehman, Najeeb U, MD;  Location: [facility];  Service: Endoscopy;  Laterality: N/A;  145pm   INSERT / REPLACE / REMOVE PACEMAKER  03/02/2016   LEFT HEART CATH AND CORONARY ANGIOGRAPHY N/A 06/20/2018   Procedure: LEFT HEART CATH AND CORONARY ANGIOGRAPHY;  Surgeon: Marykay LexHarding, David W, MD;  [facility];  Service: Cardiovascular;  Laterality: N/A;   MEDTRONIC PACEMAKER RV LEAD REMOVAL AND PLACMENT GENREATOR CHANGE OUT, (N/A Chest)  06/19/2020   PACEMAKER LEAD REMOVAL N/A 06/19/2020   Procedure: MEDTRONIC PACEMAKER RV LEAD REMOVAL AND PLACMENT GENREATOR CHANGE OUT,;  Surgeon: Marinus Mawaylor, Joyia Riehle W, MD;  [facility];  Service: Cardiovascular;  Laterality: N/A;   PARTIAL COLECTOMY N/A 10/19/2017   Procedure: PARTIAL COLECTOMY;  Surgeon: Franky MachoJenkins, Mark, MD;  Location: [facility] ORS;   Service: General;  Laterality: N/A;   POLYPECTOMY  08/03/2017   Procedure: POLYPECTOMY;  Surgeon: Malissa Hippoehman, Najeeb U, MD;  Location: [facility];  Service: Endoscopy;;  colon     Family History  Problem Relation Age of Onset   Cancer Mother        Stomach   Deep vein thrombosis Mother    Diabetes Mother    Hypertension Mother    Heart disease Mother    Hypertension Father    Diabetes Sister    Hyperlipidemia Sister    Hypertension Sister    Heart disease Sister    Diabetes Brother    Hyperlipidemia Brother  Hypertension Brother      Social History   Socioeconomic History   Marital status: Married    Spouse name: Not on file   Number of children: Not on file   Years of education: Not on file   Highest education level: Not on file  Occupational History   Not on file  Tobacco Use   Smoking status: Never   Smokeless tobacco: Never  Vaping Use   Vaping Use: Never used  Substance and Sexual Activity   Alcohol use: No    Alcohol/week: 0.0 standard drinks of alcohol   Drug use: No   Sexual activity: Not Currently    Birth control/protection: None  Other Topics Concern   Not on file  Social History Narrative   Lives in [place] with spouse.  4 grown children.   Retired but continues to clean houses   Social Determinants of Corporate investment banker Strain: Not on file  Food Insecurity: Not on file  Transportation Needs: Not on file  Physical Activity: Not on file  Stress: Not on file  Social Connections: Not on file  Intimate Partner Violence: Not on file     BP (!) 148/74   Pulse 92   Ht 5\' 5"  (1.651 m)   Wt 234 lb (106.1 kg)   LMP  (LMP Unknown)   SpO2 93%   BMI 38.94 kg/m   Physical Exam:  Well appearing NAD HEENT: Unremarkable Neck:  No JVD, no thyromegally Lymphatics:  No adenopathy Back:  No CVA tenderness Lungs:  Clear with no wheezes HEART:  Regular rate rhythm, no murmurs, no rubs, no clicks Abd:  soft, positive bowel sounds, no  organomegally, no rebound, no guarding Ext:  2 plus pulses, no edema, no cyanosis, no clubbing Skin:  No rashes no nodules Neuro:  CN II through XII intact, motor grossly intact  EKG Nsr with ventricular pacing DEVICE  Normal device function.  See PaceArt for details.   Assess/Plan:  1. PM lead dysfunction - her threshold is much improved after undergoing extraction and insertion of a new RV lead in the left bundle area. Her thresholds remain stable. 2. Atrial fib - she remains in atrial fib/flutter. Her rate is controlled.  3. CHB - she has an escape today of 35/min. We will follow. She is asymptomatic with pacing 4. Coags - she has not had bleeding and is back on her systemic anti-coagulation with Eliquis.   Sharlot Gowda Yomara Toothman,MD

## 2022-10-18 DIAGNOSIS — J449 Chronic obstructive pulmonary disease, unspecified: Secondary | ICD-10-CM | POA: Diagnosis not present

## 2022-10-19 ENCOUNTER — Ambulatory Visit (INDEPENDENT_AMBULATORY_CARE_PROVIDER_SITE_OTHER): Payer: Medicare HMO

## 2022-10-19 DIAGNOSIS — I442 Atrioventricular block, complete: Secondary | ICD-10-CM

## 2022-10-20 DIAGNOSIS — I10 Essential (primary) hypertension: Secondary | ICD-10-CM | POA: Diagnosis not present

## 2022-10-20 DIAGNOSIS — J449 Chronic obstructive pulmonary disease, unspecified: Secondary | ICD-10-CM | POA: Diagnosis not present

## 2022-10-24 ENCOUNTER — Other Ambulatory Visit: Payer: Self-pay | Admitting: Cardiology

## 2022-11-02 DIAGNOSIS — E1165 Type 2 diabetes mellitus with hyperglycemia: Secondary | ICD-10-CM | POA: Diagnosis not present

## 2022-11-09 DIAGNOSIS — Z6839 Body mass index (BMI) 39.0-39.9, adult: Secondary | ICD-10-CM | POA: Diagnosis not present

## 2022-11-09 DIAGNOSIS — I10 Essential (primary) hypertension: Secondary | ICD-10-CM | POA: Diagnosis not present

## 2022-11-09 DIAGNOSIS — Z1339 Encounter for screening examination for other mental health and behavioral disorders: Secondary | ICD-10-CM | POA: Diagnosis not present

## 2022-11-09 DIAGNOSIS — Z0000 Encounter for general adult medical examination without abnormal findings: Secondary | ICD-10-CM | POA: Diagnosis not present

## 2022-11-09 DIAGNOSIS — Z1331 Encounter for screening for depression: Secondary | ICD-10-CM | POA: Diagnosis not present

## 2022-11-09 DIAGNOSIS — Z299 Encounter for prophylactic measures, unspecified: Secondary | ICD-10-CM | POA: Diagnosis not present

## 2022-11-09 DIAGNOSIS — I509 Heart failure, unspecified: Secondary | ICD-10-CM | POA: Diagnosis not present

## 2022-11-09 DIAGNOSIS — I700 Atherosclerosis of aorta: Secondary | ICD-10-CM | POA: Diagnosis not present

## 2022-11-09 DIAGNOSIS — Z7189 Other specified counseling: Secondary | ICD-10-CM | POA: Diagnosis not present

## 2022-11-17 DIAGNOSIS — J449 Chronic obstructive pulmonary disease, unspecified: Secondary | ICD-10-CM | POA: Diagnosis not present

## 2022-11-22 NOTE — Progress Notes (Signed)
Remote pacemaker transmission.   

## 2022-11-26 DIAGNOSIS — E1151 Type 2 diabetes mellitus with diabetic peripheral angiopathy without gangrene: Secondary | ICD-10-CM | POA: Diagnosis not present

## 2022-11-26 DIAGNOSIS — Z794 Long term (current) use of insulin: Secondary | ICD-10-CM | POA: Diagnosis not present

## 2022-11-26 DIAGNOSIS — E785 Hyperlipidemia, unspecified: Secondary | ICD-10-CM | POA: Diagnosis not present

## 2022-11-26 DIAGNOSIS — K219 Gastro-esophageal reflux disease without esophagitis: Secondary | ICD-10-CM | POA: Diagnosis not present

## 2022-11-26 DIAGNOSIS — D6869 Other thrombophilia: Secondary | ICD-10-CM | POA: Diagnosis not present

## 2022-11-26 DIAGNOSIS — N1831 Chronic kidney disease, stage 3a: Secondary | ICD-10-CM | POA: Diagnosis not present

## 2022-11-26 DIAGNOSIS — R32 Unspecified urinary incontinence: Secondary | ICD-10-CM | POA: Diagnosis not present

## 2022-11-26 DIAGNOSIS — J4489 Other specified chronic obstructive pulmonary disease: Secondary | ICD-10-CM | POA: Diagnosis not present

## 2022-11-26 DIAGNOSIS — F419 Anxiety disorder, unspecified: Secondary | ICD-10-CM | POA: Diagnosis not present

## 2022-11-26 DIAGNOSIS — I509 Heart failure, unspecified: Secondary | ICD-10-CM | POA: Diagnosis not present

## 2022-11-26 DIAGNOSIS — I2510 Atherosclerotic heart disease of native coronary artery without angina pectoris: Secondary | ICD-10-CM | POA: Diagnosis not present

## 2022-12-03 DIAGNOSIS — E1165 Type 2 diabetes mellitus with hyperglycemia: Secondary | ICD-10-CM | POA: Diagnosis not present

## 2023-01-02 DIAGNOSIS — E1165 Type 2 diabetes mellitus with hyperglycemia: Secondary | ICD-10-CM | POA: Diagnosis not present

## 2023-01-11 DIAGNOSIS — E1165 Type 2 diabetes mellitus with hyperglycemia: Secondary | ICD-10-CM | POA: Diagnosis not present

## 2023-01-11 DIAGNOSIS — D6869 Other thrombophilia: Secondary | ICD-10-CM | POA: Diagnosis not present

## 2023-01-11 DIAGNOSIS — Z299 Encounter for prophylactic measures, unspecified: Secondary | ICD-10-CM | POA: Diagnosis not present

## 2023-01-11 DIAGNOSIS — I10 Essential (primary) hypertension: Secondary | ICD-10-CM | POA: Diagnosis not present

## 2023-01-11 DIAGNOSIS — I4891 Unspecified atrial fibrillation: Secondary | ICD-10-CM | POA: Diagnosis not present

## 2023-01-11 DIAGNOSIS — I700 Atherosclerosis of aorta: Secondary | ICD-10-CM | POA: Diagnosis not present

## 2023-01-18 ENCOUNTER — Ambulatory Visit (INDEPENDENT_AMBULATORY_CARE_PROVIDER_SITE_OTHER): Payer: Medicare HMO

## 2023-01-18 DIAGNOSIS — I442 Atrioventricular block, complete: Secondary | ICD-10-CM

## 2023-01-19 LAB — CUP PACEART REMOTE DEVICE CHECK
Battery Remaining Longevity: 116 mo
Battery Voltage: 3.02 V
Brady Statistic AP VP Percent: 0 %
Brady Statistic AP VS Percent: 0 %
Brady Statistic AS VP Percent: 99.73 %
Brady Statistic AS VS Percent: 0.27 %
Brady Statistic RA Percent Paced: 0 %
Brady Statistic RV Percent Paced: 99.73 %
Date Time Interrogation Session: 20240716000008
Implantable Lead Connection Status: 753985
Implantable Lead Implant Date: 20170829
Implantable Lead Location: 753859
Implantable Lead Model: 5076
Implantable Pulse Generator Implant Date: 20211216
Lead Channel Impedance Value: 285 Ohm
Lead Channel Impedance Value: 380 Ohm
Lead Channel Impedance Value: 437 Ohm
Lead Channel Impedance Value: 627 Ohm
Lead Channel Pacing Threshold Amplitude: 0.75 V
Lead Channel Pacing Threshold Pulse Width: 0.4 ms
Lead Channel Sensing Intrinsic Amplitude: 0.375 mV
Lead Channel Sensing Intrinsic Amplitude: 24.875 mV
Lead Channel Sensing Intrinsic Amplitude: 24.875 mV
Lead Channel Setting Pacing Amplitude: 2.5 V
Lead Channel Setting Pacing Pulse Width: 0.4 ms
Lead Channel Setting Sensing Sensitivity: 1.2 mV
Zone Setting Status: 755011

## 2023-01-22 ENCOUNTER — Other Ambulatory Visit: Payer: Self-pay | Admitting: Cardiology

## 2023-01-24 DIAGNOSIS — H40023 Open angle with borderline findings, high risk, bilateral: Secondary | ICD-10-CM | POA: Diagnosis not present

## 2023-02-02 DIAGNOSIS — E1165 Type 2 diabetes mellitus with hyperglycemia: Secondary | ICD-10-CM | POA: Diagnosis not present

## 2023-02-03 NOTE — Progress Notes (Signed)
Remote pacemaker transmission.   

## 2023-02-28 DIAGNOSIS — N950 Postmenopausal bleeding: Secondary | ICD-10-CM | POA: Diagnosis not present

## 2023-02-28 DIAGNOSIS — N870 Mild cervical dysplasia: Secondary | ICD-10-CM | POA: Diagnosis not present

## 2023-03-05 DIAGNOSIS — E1165 Type 2 diabetes mellitus with hyperglycemia: Secondary | ICD-10-CM | POA: Diagnosis not present

## 2023-03-08 DIAGNOSIS — Z1231 Encounter for screening mammogram for malignant neoplasm of breast: Secondary | ICD-10-CM | POA: Diagnosis not present

## 2023-03-10 DIAGNOSIS — N950 Postmenopausal bleeding: Secondary | ICD-10-CM | POA: Diagnosis not present

## 2023-03-10 DIAGNOSIS — R9389 Abnormal findings on diagnostic imaging of other specified body structures: Secondary | ICD-10-CM | POA: Diagnosis not present

## 2023-04-04 DIAGNOSIS — E1165 Type 2 diabetes mellitus with hyperglycemia: Secondary | ICD-10-CM | POA: Diagnosis not present

## 2023-04-13 DIAGNOSIS — Z792 Long term (current) use of antibiotics: Secondary | ICD-10-CM | POA: Diagnosis not present

## 2023-04-13 DIAGNOSIS — Z79899 Other long term (current) drug therapy: Secondary | ICD-10-CM | POA: Diagnosis not present

## 2023-04-13 DIAGNOSIS — Z7985 Long-term (current) use of injectable non-insulin antidiabetic drugs: Secondary | ICD-10-CM | POA: Diagnosis not present

## 2023-04-13 DIAGNOSIS — Z7901 Long term (current) use of anticoagulants: Secondary | ICD-10-CM | POA: Diagnosis not present

## 2023-04-13 DIAGNOSIS — N950 Postmenopausal bleeding: Secondary | ICD-10-CM | POA: Diagnosis not present

## 2023-04-19 ENCOUNTER — Ambulatory Visit (INDEPENDENT_AMBULATORY_CARE_PROVIDER_SITE_OTHER): Payer: Medicare HMO

## 2023-04-19 DIAGNOSIS — I442 Atrioventricular block, complete: Secondary | ICD-10-CM

## 2023-04-20 LAB — CUP PACEART REMOTE DEVICE CHECK
Battery Remaining Longevity: 111 mo
Battery Voltage: 3.02 V
Brady Statistic AP VP Percent: 0 %
Brady Statistic AP VS Percent: 0 %
Brady Statistic AS VP Percent: 99.75 %
Brady Statistic AS VS Percent: 0.25 %
Brady Statistic RA Percent Paced: 0 %
Brady Statistic RV Percent Paced: 99.75 %
Date Time Interrogation Session: 20241015025731
Implantable Lead Connection Status: 753985
Implantable Lead Implant Date: 20170829
Implantable Lead Location: 753859
Implantable Lead Model: 5076
Implantable Pulse Generator Implant Date: 20211216
Lead Channel Impedance Value: 285 Ohm
Lead Channel Impedance Value: 380 Ohm
Lead Channel Impedance Value: 399 Ohm
Lead Channel Impedance Value: 570 Ohm
Lead Channel Pacing Threshold Amplitude: 0.75 V
Lead Channel Pacing Threshold Pulse Width: 0.4 ms
Lead Channel Sensing Intrinsic Amplitude: 0.375 mV
Lead Channel Sensing Intrinsic Amplitude: 26.125 mV
Lead Channel Sensing Intrinsic Amplitude: 26.125 mV
Lead Channel Setting Pacing Amplitude: 2.5 V
Lead Channel Setting Pacing Pulse Width: 0.4 ms
Lead Channel Setting Sensing Sensitivity: 1.2 mV
Zone Setting Status: 755011

## 2023-05-04 DIAGNOSIS — E1165 Type 2 diabetes mellitus with hyperglycemia: Secondary | ICD-10-CM | POA: Diagnosis not present

## 2023-05-06 NOTE — Progress Notes (Signed)
Remote pacemaker transmission.   

## 2023-05-19 DIAGNOSIS — Z7901 Long term (current) use of anticoagulants: Secondary | ICD-10-CM | POA: Diagnosis not present

## 2023-05-19 DIAGNOSIS — R062 Wheezing: Secondary | ICD-10-CM | POA: Diagnosis not present

## 2023-05-19 DIAGNOSIS — R87619 Unspecified abnormal cytological findings in specimens from cervix uteri: Secondary | ICD-10-CM | POA: Diagnosis not present

## 2023-05-19 DIAGNOSIS — Z794 Long term (current) use of insulin: Secondary | ICD-10-CM | POA: Diagnosis not present

## 2023-05-19 DIAGNOSIS — J9621 Acute and chronic respiratory failure with hypoxia: Secondary | ICD-10-CM | POA: Diagnosis not present

## 2023-05-19 DIAGNOSIS — Z950 Presence of cardiac pacemaker: Secondary | ICD-10-CM | POA: Diagnosis not present

## 2023-05-19 DIAGNOSIS — E119 Type 2 diabetes mellitus without complications: Secondary | ICD-10-CM | POA: Diagnosis not present

## 2023-05-19 DIAGNOSIS — Z800 Family history of malignant neoplasm of digestive organs: Secondary | ICD-10-CM | POA: Diagnosis not present

## 2023-05-19 DIAGNOSIS — R600 Localized edema: Secondary | ICD-10-CM | POA: Diagnosis not present

## 2023-05-19 DIAGNOSIS — Z836 Family history of other diseases of the respiratory system: Secondary | ICD-10-CM | POA: Diagnosis not present

## 2023-05-19 DIAGNOSIS — E11649 Type 2 diabetes mellitus with hypoglycemia without coma: Secondary | ICD-10-CM | POA: Diagnosis not present

## 2023-05-19 DIAGNOSIS — I480 Paroxysmal atrial fibrillation: Secondary | ICD-10-CM | POA: Diagnosis not present

## 2023-05-19 DIAGNOSIS — Z79899 Other long term (current) drug therapy: Secondary | ICD-10-CM | POA: Diagnosis not present

## 2023-05-19 DIAGNOSIS — Z7985 Long-term (current) use of injectable non-insulin antidiabetic drugs: Secondary | ICD-10-CM | POA: Diagnosis not present

## 2023-05-19 DIAGNOSIS — R0989 Other specified symptoms and signs involving the circulatory and respiratory systems: Secondary | ICD-10-CM | POA: Diagnosis not present

## 2023-05-19 DIAGNOSIS — I5032 Chronic diastolic (congestive) heart failure: Secondary | ICD-10-CM | POA: Diagnosis not present

## 2023-05-19 DIAGNOSIS — I110 Hypertensive heart disease with heart failure: Secondary | ICD-10-CM | POA: Diagnosis not present

## 2023-05-19 DIAGNOSIS — Z885 Allergy status to narcotic agent status: Secondary | ICD-10-CM | POA: Diagnosis not present

## 2023-05-19 DIAGNOSIS — R0602 Shortness of breath: Secondary | ICD-10-CM | POA: Diagnosis not present

## 2023-05-19 DIAGNOSIS — J984 Other disorders of lung: Secondary | ICD-10-CM | POA: Diagnosis not present

## 2023-05-19 DIAGNOSIS — Z7984 Long term (current) use of oral hypoglycemic drugs: Secondary | ICD-10-CM | POA: Diagnosis not present

## 2023-05-19 DIAGNOSIS — Z8249 Family history of ischemic heart disease and other diseases of the circulatory system: Secondary | ICD-10-CM | POA: Diagnosis not present

## 2023-05-19 DIAGNOSIS — R9431 Abnormal electrocardiogram [ECG] [EKG]: Secondary | ICD-10-CM | POA: Diagnosis not present

## 2023-05-19 DIAGNOSIS — Z880 Allergy status to penicillin: Secondary | ICD-10-CM | POA: Diagnosis not present

## 2023-05-19 DIAGNOSIS — Z792 Long term (current) use of antibiotics: Secondary | ICD-10-CM | POA: Diagnosis not present

## 2023-05-19 DIAGNOSIS — J441 Chronic obstructive pulmonary disease with (acute) exacerbation: Secondary | ICD-10-CM | POA: Diagnosis not present

## 2023-05-19 DIAGNOSIS — Z833 Family history of diabetes mellitus: Secondary | ICD-10-CM | POA: Diagnosis not present

## 2023-05-20 DIAGNOSIS — Z836 Family history of other diseases of the respiratory system: Secondary | ICD-10-CM | POA: Diagnosis not present

## 2023-05-20 DIAGNOSIS — J441 Chronic obstructive pulmonary disease with (acute) exacerbation: Secondary | ICD-10-CM | POA: Diagnosis not present

## 2023-05-20 DIAGNOSIS — R0602 Shortness of breath: Secondary | ICD-10-CM | POA: Diagnosis not present

## 2023-05-20 DIAGNOSIS — Z885 Allergy status to narcotic agent status: Secondary | ICD-10-CM | POA: Diagnosis not present

## 2023-05-20 DIAGNOSIS — E119 Type 2 diabetes mellitus without complications: Secondary | ICD-10-CM | POA: Diagnosis not present

## 2023-05-20 DIAGNOSIS — R9431 Abnormal electrocardiogram [ECG] [EKG]: Secondary | ICD-10-CM | POA: Diagnosis not present

## 2023-05-20 DIAGNOSIS — R0600 Dyspnea, unspecified: Secondary | ICD-10-CM | POA: Diagnosis not present

## 2023-05-20 DIAGNOSIS — Z8249 Family history of ischemic heart disease and other diseases of the circulatory system: Secondary | ICD-10-CM | POA: Diagnosis not present

## 2023-05-20 DIAGNOSIS — Z880 Allergy status to penicillin: Secondary | ICD-10-CM | POA: Diagnosis not present

## 2023-05-20 DIAGNOSIS — Z792 Long term (current) use of antibiotics: Secondary | ICD-10-CM | POA: Diagnosis not present

## 2023-05-20 DIAGNOSIS — Z7985 Long-term (current) use of injectable non-insulin antidiabetic drugs: Secondary | ICD-10-CM | POA: Diagnosis not present

## 2023-05-20 DIAGNOSIS — I110 Hypertensive heart disease with heart failure: Secondary | ICD-10-CM | POA: Diagnosis not present

## 2023-05-20 DIAGNOSIS — Z7901 Long term (current) use of anticoagulants: Secondary | ICD-10-CM | POA: Diagnosis not present

## 2023-05-20 DIAGNOSIS — Z800 Family history of malignant neoplasm of digestive organs: Secondary | ICD-10-CM | POA: Diagnosis not present

## 2023-05-20 DIAGNOSIS — Z7984 Long term (current) use of oral hypoglycemic drugs: Secondary | ICD-10-CM | POA: Diagnosis not present

## 2023-05-20 DIAGNOSIS — Z950 Presence of cardiac pacemaker: Secondary | ICD-10-CM | POA: Diagnosis not present

## 2023-05-20 DIAGNOSIS — Z833 Family history of diabetes mellitus: Secondary | ICD-10-CM | POA: Diagnosis not present

## 2023-05-20 DIAGNOSIS — R600 Localized edema: Secondary | ICD-10-CM | POA: Diagnosis not present

## 2023-05-20 DIAGNOSIS — I5032 Chronic diastolic (congestive) heart failure: Secondary | ICD-10-CM | POA: Diagnosis not present

## 2023-05-20 DIAGNOSIS — Z794 Long term (current) use of insulin: Secondary | ICD-10-CM | POA: Diagnosis not present

## 2023-05-23 ENCOUNTER — Telehealth: Payer: Self-pay | Admitting: Cardiology

## 2023-05-23 NOTE — Telephone Encounter (Signed)
Patient given a sooner appointment by [facility] to see Diona Browner on 05/25/2023 @1 :40 pm at the [place] office.  Denies symptoms at this time. Advised if her symptoms return go go back to the ED for an evaluation. Verbalized understanding of plan.

## 2023-05-23 NOTE — Telephone Encounter (Signed)
   Pt c/o of Chest Pain: STAT if active CP, including tightness, pressure, jaw pain, radiating pain to shoulder/upper arm/back, CP unrelieved by Nitro. Symptoms reported of SOB, nausea, vomiting, sweating.  1. Are you having CP right now? Not at this time    2. Are you experiencing any other symptoms (ex. SOB, nausea, vomiting, sweating)? Been having  shortness of breath, not at  this time, she is on oxygen   3. Is your CP continuous or coming and going? Comes and goes  4. Have you taken Nitroglycerin? Took some last week    5. How long have you been experiencing CP? About 2 weeks- she went t to the hospital at [facility] in [place]- patient said she was told to see her Cardiologist- no available appointments until 06-30-23 in [place]- she said she needs to be seen before that date     6. If NO CP at time of call then end call with telling Pt to call back or call 911 if Chest pain returns prior to return call from triage team.

## 2023-05-24 ENCOUNTER — Encounter: Payer: Self-pay | Admitting: *Deleted

## 2023-05-25 ENCOUNTER — Ambulatory Visit: Payer: Medicare HMO | Attending: Cardiology | Admitting: Cardiology

## 2023-05-25 ENCOUNTER — Encounter: Payer: Self-pay | Admitting: Cardiology

## 2023-05-25 VITALS — BP 146/72 | HR 81 | Ht 65.0 in | Wt 232.8 lb

## 2023-05-25 DIAGNOSIS — I4819 Other persistent atrial fibrillation: Secondary | ICD-10-CM

## 2023-05-25 DIAGNOSIS — R0602 Shortness of breath: Secondary | ICD-10-CM | POA: Diagnosis not present

## 2023-05-25 DIAGNOSIS — Z79899 Other long term (current) drug therapy: Secondary | ICD-10-CM

## 2023-05-25 DIAGNOSIS — I442 Atrioventricular block, complete: Secondary | ICD-10-CM

## 2023-05-25 DIAGNOSIS — I5032 Chronic diastolic (congestive) heart failure: Secondary | ICD-10-CM

## 2023-05-25 MED ORDER — ENTRESTO 49-51 MG PO TABS: 1.0000 | ORAL_TABLET | Freq: Two times a day (BID) | ORAL | 11 refills | Status: DC

## 2023-05-25 NOTE — Progress Notes (Signed)
    Cardiology Office Note  Date: 05/25/2023   ID: Ajanee, Oswalt 09/12/1940, MRN [id number]  History of Present Illness: Miranda Sexton is an 82 y.o. female last seen in January 2023.  She is here for a follow-up visit.  Records indicate recent evaluation at [facility] for reported COPD exacerbation.  Labwork reviewed below.  Chest x-ray did not report any pulmonary edema or pleural effusions.  She is here today for follow-up, states that she feels better overall.  Has been using supplemental oxygen intermittently at home, also consistent with Lasix.  We discussed getting an updated echocardiogram.  Medtronic pacemaker in place, followed by Dr. Ladona Ridgel.  She had a follow-up visit with him in April, I reviewed the note.  Device interrogation in October revealed normal function.  I reviewed her medications.  Current regimen includes Lipitor, Lotensin, Lasix, Toprol-XL, potassium supplement, and as needed nitroglycerin.  Plan to switch from Lotensin to [place].  Physical Exam: VS:  BP (!) 150/72   Pulse 81   Ht 5\' 5"  (1.651 m)   Wt 232 lb 12.8 oz (105.6 kg)   LMP  (LMP Unknown)   SpO2 93%   BMI 38.74 kg/m , BMI Body mass index is 38.74 kg/m.  Wt Readings from Last 3 Encounters:  05/25/23 232 lb 12.8 oz (105.6 kg)  10/12/22 234 lb (106.1 kg)  09/16/21 230 lb (104.3 kg)    General: Patient appears comfortable at rest. HEENT: Conjunctiva and lids normal. Neck: Supple, difficult to assess JVP. Lungs: Clear to auscultation, nonlabored breathing at rest. Cardiac: Regular rate and rhythm, no S3, 2-3/6 systolic murmur. Extremities: 1-2+ lower leg edema.  ECG:  An ECG dated 05/19/2023 was personally reviewed today and demonstrated:  Ventricular paced rhythm.  Labwork:  November 2024: Potassium 4.7, BUN 14, creatinine 1.04, AST 28, ALT 33, hemoglobin 12.5, platelets 252, pro-BNP 790  Other Studies Reviewed Today:  No interval cardiac testing for review  today.  Assessment and Plan:  1.  Persistent atrial fibrillation with CHA2DS2-VASc score of 6.  No palpitations.  Heart rate controlled with ventricular pacing evident by ECG today.  Continue Eliquis for stroke prophylaxis.  She is also on Toprol-XL.   2.  Carotid artery disease, follow-up carotid Dopplers in March 2023 revealed 1 to 39% RICA stenosis and occluded LICA.  Previously followed by Dr. Arbie Cookey prior to his retirement.  Will plan to update carotid Dopplers around the time of her next visit.   3.  Sick sinus syndrome ultimately complete heart block with Medtronic dual-chamber pacemaker in place, now followed by Dr. Ladona Ridgel.   4.  HFpEF with chronic diastolic heart failure.  Update echocardiogram.  Switching from Lotensin to Entresto 41/51 mg twice daily after appropriate washout.  Continue Lasix with potassium supplement.  Will continue to modify therapy as tolerated.  Follow-up BMET in 2 weeks.  Disposition:  Follow up  3 months.  Signed, Jonelle Sidle, M.D., F.A.C.C. [facility]

## 2023-05-25 NOTE — Patient Instructions (Addendum)
Medication Instructions:    STOP Lotensin (benazapril)  On 05/28/23, START Entresto 49/51 mg Twice a day    Labwork: BMET at [facility] in 2 weeks (12/4)  Testing/Procedures: Your physician has requested that you have an echocardiogram. Echocardiography is a painless test that uses sound waves to create images of your heart. It provides your doctor with information about the size and shape of your heart and how well your heart's chambers and valves are working. This procedure takes approximately one hour. There are no restrictions for this procedure. Please do NOT wear cologne, perfume, aftershave, or lotions (deodorant is allowed). Please arrive 15 minutes prior to your appointment time.  Please note: We ask at that you not bring children with you during ultrasound (echo/ vascular) testing. Due to room size and safety concerns, children are not allowed in the ultrasound rooms during exams. Our front office staff cannot provide observation of children in our lobby area while testing is being conducted. An adult accompanying a patient to their appointment will only be allowed in the ultrasound room at the discretion of the ultrasound technician under special circumstances. We apologize for any inconvenience.   Follow-Up: 3 months  Any Other Special Instructions Will Be Listed Below (If Applicable).  If you need a refill on your cardiac medications before your next appointment, please call your pharmacy.

## 2023-06-03 DIAGNOSIS — E1165 Type 2 diabetes mellitus with hyperglycemia: Secondary | ICD-10-CM | POA: Diagnosis not present

## 2023-06-10 ENCOUNTER — Ambulatory Visit (HOSPITAL_COMMUNITY): Admission: RE | Admit: 2023-06-10 | Payer: Medicare HMO | Source: Ambulatory Visit

## 2023-06-16 ENCOUNTER — Ambulatory Visit: Payer: Medicare HMO | Admitting: Nurse Practitioner

## 2023-06-19 DIAGNOSIS — J441 Chronic obstructive pulmonary disease with (acute) exacerbation: Secondary | ICD-10-CM | POA: Diagnosis not present

## 2023-07-01 ENCOUNTER — Ambulatory Visit (HOSPITAL_COMMUNITY): Admission: RE | Admit: 2023-07-01 | Payer: Medicare HMO | Source: Ambulatory Visit

## 2023-07-01 DIAGNOSIS — I5032 Chronic diastolic (congestive) heart failure: Secondary | ICD-10-CM | POA: Diagnosis not present

## 2023-07-01 DIAGNOSIS — I509 Heart failure, unspecified: Secondary | ICD-10-CM | POA: Diagnosis not present

## 2023-07-04 DIAGNOSIS — I272 Pulmonary hypertension, unspecified: Secondary | ICD-10-CM | POA: Diagnosis not present

## 2023-07-04 DIAGNOSIS — I361 Nonrheumatic tricuspid (valve) insufficiency: Secondary | ICD-10-CM | POA: Diagnosis not present

## 2023-07-04 DIAGNOSIS — I351 Nonrheumatic aortic (valve) insufficiency: Secondary | ICD-10-CM | POA: Diagnosis not present

## 2023-07-04 DIAGNOSIS — I340 Nonrheumatic mitral (valve) insufficiency: Secondary | ICD-10-CM | POA: Diagnosis not present

## 2023-07-04 DIAGNOSIS — E1165 Type 2 diabetes mellitus with hyperglycemia: Secondary | ICD-10-CM | POA: Diagnosis not present

## 2023-07-19 ENCOUNTER — Ambulatory Visit (INDEPENDENT_AMBULATORY_CARE_PROVIDER_SITE_OTHER): Payer: Medicare HMO

## 2023-07-19 DIAGNOSIS — I442 Atrioventricular block, complete: Secondary | ICD-10-CM

## 2023-07-19 LAB — CUP PACEART REMOTE DEVICE CHECK
Battery Remaining Longevity: 111 mo
Battery Voltage: 3.02 V
Brady Statistic AP VP Percent: 0 %
Brady Statistic AP VS Percent: 0 %
Brady Statistic AS VP Percent: 98.63 %
Brady Statistic AS VS Percent: 1.37 %
Brady Statistic RA Percent Paced: 0 %
Brady Statistic RV Percent Paced: 98.63 %
Date Time Interrogation Session: 20250114025109
Implantable Lead Connection Status: 753985
Implantable Lead Implant Date: 20170829
Implantable Lead Location: 753859
Implantable Lead Model: 5076
Implantable Pulse Generator Implant Date: 20211216
Lead Channel Impedance Value: 285 Ohm
Lead Channel Impedance Value: 380 Ohm
Lead Channel Impedance Value: 399 Ohm
Lead Channel Impedance Value: 627 Ohm
Lead Channel Pacing Threshold Amplitude: 0.75 V
Lead Channel Pacing Threshold Pulse Width: 0.4 ms
Lead Channel Sensing Intrinsic Amplitude: 0.375 mV
Lead Channel Sensing Intrinsic Amplitude: 25.875 mV
Lead Channel Sensing Intrinsic Amplitude: 25.875 mV
Lead Channel Setting Pacing Amplitude: 2.5 V
Lead Channel Setting Pacing Pulse Width: 0.4 ms
Lead Channel Setting Sensing Sensitivity: 1.2 mV
Zone Setting Status: 755011

## 2023-07-20 DIAGNOSIS — J441 Chronic obstructive pulmonary disease with (acute) exacerbation: Secondary | ICD-10-CM | POA: Diagnosis not present

## 2023-07-28 ENCOUNTER — Other Ambulatory Visit: Payer: Self-pay | Admitting: Internal Medicine

## 2023-07-28 DIAGNOSIS — I4819 Other persistent atrial fibrillation: Secondary | ICD-10-CM

## 2023-07-28 NOTE — Telephone Encounter (Signed)
Eliquis 5mg  refill request received. Patient is 83 years old, weight-105.6kg, Crea-1.04 on 05/20/23 via [facility] from [facility] health, Diagnosis-Afib, and last seen by Dr. Diona Browner on 05/25/23. Dose is appropriate based on dosing criteria. Will send in refill to requested pharmacy.

## 2023-08-03 DIAGNOSIS — E1165 Type 2 diabetes mellitus with hyperglycemia: Secondary | ICD-10-CM | POA: Diagnosis not present

## 2023-08-20 DIAGNOSIS — J441 Chronic obstructive pulmonary disease with (acute) exacerbation: Secondary | ICD-10-CM | POA: Diagnosis not present

## 2023-08-24 NOTE — Progress Notes (Deleted)
   Cardiology Office Note    Date:  08/24/2023  ID:  Miranda Sexton, DOB 01-12-1941, MRN [id number] Cardiologist: Nona Dell, MD   EP: Dr. Ladona Ridgel  History of Present Illness:    Miranda Sexton is a 83 y.o. female with past medical history of HTN, HLD, Type 2 DM, paroxysmal atrial fibrillation, chronic HFpEF, COPD, carotid artery stenosis and sick sinus syndrome (status post Medtronic PPM) who presents to the office today for 83-month follow-up.  She was last exam by Dr. Diona Browner in 05/2023 and had recent been evaluated for a COPD exacerbation.  Was using supplemental oxygen as needed and her weight had overall been stable, at 232 lbs on the office scales.  It was recommended to obtain follow-up carotid dopplers and a repeat echocardiogram.  Lotensin was also stopped and transition to Entresto 49-51 mg twice daily with plans for a follow-up BMET.  She did not keep her appointments for follow-up echocardiograms and has not had repeat labs.  It appears that she actually had a follow-up echocardiogram at [facility] and 06/2023 which showed her EF was greater than 55%.  She did have mild to moderate MR, mild AI and mild AS.  Was also noted to have low normal RV function and moderate pulmonary hypertension.  - BMET   ROS: ***  Studies Reviewed:   EKG: EKG is*** ordered today and demonstrates ***   EKG Interpretation Date/Time:    Ventricular Rate:    PR Interval:    QRS Duration:    QT Interval:    QTC Calculation:   R Axis:      Text Interpretation:           Risk Assessment/Calculations:   {Does this patient have ATRIAL FIBRILLATION?[phone]} No BP recorded.  {Refresh Note OR Click here to enter BP  :1}***         Physical Exam:   VS:  LMP  (LMP Unknown)    Wt Readings from Last 3 Encounters:  05/25/23 232 lb 12.8 oz (105.6 kg)  10/12/22 234 lb (106.1 kg)  09/16/21 230 lb (104.3 kg)     GEN: Well nourished, well developed in no acute distress NECK: No  JVD; No carotid bruits CARDIAC: ***RRR, no murmurs, rubs, gallops RESPIRATORY:  Clear to auscultation without rales, wheezing or rhonchi  ABDOMEN: Appears non-distended. No obvious abdominal masses. EXTREMITIES: No clubbing or cyanosis. No edema.  Distal pedal pulses are 2+ bilaterally.   Assessment and Plan:      {Are you ordering a CV Procedure (e.g. stress test, cath, DCCV, TEE, etc)?   Press F2        :[id number]}   Signed, Ellsworth Lennox, PA-C

## 2023-08-25 ENCOUNTER — Ambulatory Visit: Payer: Medicare HMO | Admitting: Student

## 2023-08-31 DIAGNOSIS — I25119 Atherosclerotic heart disease of native coronary artery with unspecified angina pectoris: Secondary | ICD-10-CM | POA: Diagnosis not present

## 2023-08-31 DIAGNOSIS — N1831 Chronic kidney disease, stage 3a: Secondary | ICD-10-CM | POA: Diagnosis not present

## 2023-08-31 DIAGNOSIS — J4489 Other specified chronic obstructive pulmonary disease: Secondary | ICD-10-CM | POA: Diagnosis not present

## 2023-08-31 DIAGNOSIS — I272 Pulmonary hypertension, unspecified: Secondary | ICD-10-CM | POA: Diagnosis not present

## 2023-08-31 DIAGNOSIS — I700 Atherosclerosis of aorta: Secondary | ICD-10-CM | POA: Diagnosis not present

## 2023-08-31 DIAGNOSIS — I130 Hypertensive heart and chronic kidney disease with heart failure and stage 1 through stage 4 chronic kidney disease, or unspecified chronic kidney disease: Secondary | ICD-10-CM | POA: Diagnosis not present

## 2023-08-31 DIAGNOSIS — F324 Major depressive disorder, single episode, in partial remission: Secondary | ICD-10-CM | POA: Diagnosis not present

## 2023-08-31 DIAGNOSIS — I509 Heart failure, unspecified: Secondary | ICD-10-CM | POA: Diagnosis not present

## 2023-08-31 DIAGNOSIS — I4891 Unspecified atrial fibrillation: Secondary | ICD-10-CM | POA: Diagnosis not present

## 2023-08-31 DIAGNOSIS — E1151 Type 2 diabetes mellitus with diabetic peripheral angiopathy without gangrene: Secondary | ICD-10-CM | POA: Diagnosis not present

## 2023-08-31 DIAGNOSIS — D6869 Other thrombophilia: Secondary | ICD-10-CM | POA: Diagnosis not present

## 2023-08-31 NOTE — Progress Notes (Signed)
 Remote pacemaker transmission.

## 2023-09-02 DIAGNOSIS — E1165 Type 2 diabetes mellitus with hyperglycemia: Secondary | ICD-10-CM | POA: Diagnosis not present

## 2023-09-17 DIAGNOSIS — J441 Chronic obstructive pulmonary disease with (acute) exacerbation: Secondary | ICD-10-CM | POA: Diagnosis not present

## 2023-09-20 DIAGNOSIS — H524 Presbyopia: Secondary | ICD-10-CM | POA: Diagnosis not present

## 2023-09-20 DIAGNOSIS — H40023 Open angle with borderline findings, high risk, bilateral: Secondary | ICD-10-CM | POA: Diagnosis not present

## 2023-09-23 ENCOUNTER — Ambulatory Visit (INDEPENDENT_AMBULATORY_CARE_PROVIDER_SITE_OTHER): Admitting: Ophthalmology

## 2023-09-23 ENCOUNTER — Encounter (INDEPENDENT_AMBULATORY_CARE_PROVIDER_SITE_OTHER): Payer: Self-pay | Admitting: Ophthalmology

## 2023-09-23 DIAGNOSIS — H04123 Dry eye syndrome of bilateral lacrimal glands: Secondary | ICD-10-CM | POA: Diagnosis not present

## 2023-09-23 DIAGNOSIS — H35033 Hypertensive retinopathy, bilateral: Secondary | ICD-10-CM | POA: Diagnosis not present

## 2023-09-23 DIAGNOSIS — I10 Essential (primary) hypertension: Secondary | ICD-10-CM | POA: Diagnosis not present

## 2023-09-23 DIAGNOSIS — Z961 Presence of intraocular lens: Secondary | ICD-10-CM | POA: Diagnosis not present

## 2023-09-23 DIAGNOSIS — E119 Type 2 diabetes mellitus without complications: Secondary | ICD-10-CM | POA: Diagnosis not present

## 2023-09-23 DIAGNOSIS — Z7984 Long term (current) use of oral hypoglycemic drugs: Secondary | ICD-10-CM

## 2023-09-23 DIAGNOSIS — Z794 Long term (current) use of insulin: Secondary | ICD-10-CM | POA: Diagnosis not present

## 2023-09-23 NOTE — Progress Notes (Addendum)
 [facility] - Clinic Note  09/23/2023   CHIEF COMPLAINT Patient presents for Retina Evaluation  HISTORY OF PRESENT ILLNESS: Miranda Sexton is a 83 y.o. female who presents to the clinic today for:  HPI     Retina Evaluation   In left eye.  This started 2 months ago.  Associated Symptoms Floaters and Shoulder/Hip pain.  Context:  distance vision, mid-range vision and near vision.  Response to treatment was no improvement.  I, the attending physician,  performed the HPI with the patient and updated documentation appropriately.        Comments   Pt was referred by Dr. Earlene Plater for evaluation of a retinal hemophage in the left eye. Pt states she noticed decreased vision in her left eye while watching TV, the top of the screen is clear but the bottom looks fuzzy. Pt states she was noticing some floaters but they have gone away. Pt denies any flashes. Pt states she is not experiencing any eye pain but she does have pain in her left shoulder. Pt's last A1c was 7.2 in January 2025, Pt states her BS was 98 this morning.      Last edited by Rennis Chris, MD on 09/23/2023  9:34 AM.    Patient states that when watching TV the vision is fuzzy. And she complaining of dry eyes. She feels the vision has been poor over the last two months. She sees floaters.    Referring physician: Vernia Buff, OD   HISTORICAL INFORMATION:  Selected notes from the MEDICAL RECORD NUMBER Referred by Dr. Monico Blitz LEE:  Ocular Hx- PMH-   CURRENT MEDICATIONS: Current Outpatient Medications (Ophthalmic Drugs)  Medication Sig   moxifloxacin (VIGAMOX) 0.5 % ophthalmic solution Place 1 drop into both eyes in the morning and at bedtime.   No current facility-administered medications for this visit. (Ophthalmic Drugs)   Current Outpatient Medications (Other)  Medication Sig   acetaminophen (TYLENOL) 325 MG tablet Take 325 mg by mouth every 6 (six) hours as needed for mild pain or headache.    albuterol (VENTOLIN HFA) 108 (90 Base) MCG/ACT inhaler Inhale 2 puffs into the lungs every 6 (six) hours as needed for wheezing or shortness of breath.    apixaban (ELIQUIS) 5 MG TABS tablet TAKE 1 TABLET BY MOUTH TWICE A DAY   ascorbic acid (VITAMIN C) 500 MG tablet Take 500 mg by mouth daily.   atorvastatin (LIPITOR) 40 MG tablet Take 40 mg by mouth daily.   baclofen (LIORESAL) 10 MG tablet Take 10 mg by mouth 2 (two) times daily.   doxycycline (VIBRA-TABS) 100 MG tablet Take 100 mg by mouth 2 (two) times daily.   furosemide (LASIX) 40 MG tablet Take 80 mg by mouth daily.   glipiZIDE (GLUCOTROL XL) 10 MG 24 hr tablet Take 10 mg by mouth daily.    insulin aspart protamine- aspart (NOVOLOG 70/30) (70-30) 100 UNIT/ML injection Inject 30-46 Units into the skin See admin instructions. Inject 46 units in the morning & 30 units in the evening.   metoprolol succinate (TOPROL-XL) 25 MG 24 hr tablet TAKE 1/2 TABLET(12.5 MG) BY MOUTH DAILY   nitroGLYCERIN (NITROSTAT) 0.4 MG SL tablet Place 1 tablet (0.4 mg total) under the tongue every 5 (five) minutes as needed for chest pain.   ONETOUCH VERIO test strip 2 (two) times daily.   potassium chloride SA (K-DUR) 20 MEQ tablet Take 1 tablet (20 mEq total) by mouth daily.   sacubitril-valsartan (ENTRESTO)  49-51 MG Take 1 tablet by mouth 2 (two) times daily.   TRULICITY 0.75 MG/0.5ML SOPN Inject 0.75 mg into the skin every Tuesday.   venlafaxine XR (EFFEXOR-XR) 150 MG 24 hr capsule Take 150 mg by mouth daily.   vitamin B-12 (CYANOCOBALAMIN) 50 MCG tablet Take 50 mcg by mouth daily.   No current facility-administered medications for this visit. (Other)   REVIEW OF SYSTEMS: ROS   Positive for: Gastrointestinal, Musculoskeletal, Respiratory Last edited by Elicia Lamp, COT on 09/23/2023  9:00 AM.     ALLERGIES Allergies  Allergen Reactions   Codeine Nausea And Vomiting   Penicillins Rash and Other (See Comments)    Has patient had a PCN reaction  causing immediate rash, facial/tongue/throat swelling, SOB or lightheadedness with hypotension: Yes Has patient had a PCN reaction causing severe rash involving mucus membranes or skin necrosis: No Has patient had a PCN reaction that required hospitalization No Has patient had a PCN reaction occurring within the last 10 years: No If all of the above answers are "NO", then may proceed with Cephalosporin use.    PAST MEDICAL HISTORY Past Medical History:  Diagnosis Date   Anxiety    Asthma    Carotid artery occlusion    Chronic bronchitis (HCC)    Chronic diastolic heart failure (HCC)    CKD (chronic kidney disease) stage 2, GFR 60-89 ml/min    COPD (chronic obstructive pulmonary disease) (HCC)    Coronary atherosclerosis of native coronary artery    Nonobstructive 08/2008   Daily headache    DJD (degenerative joint disease)    Essential hypertension    GERD (gastroesophageal reflux disease)    Hyperlipidemia    Obesity    On home oxygen therapy    PAD (peripheral artery disease) (HCC)    Moderate right renal artery stenosis 08/2008   Paroxysmal atrial fibrillation (HCC)    Pneumonia    Presence of permanent cardiac pacemaker    Medtronic Advisa DR MRI SureScan model [id number] PPM 03/03/16   Sick sinus syndrome (HCC)    Medtronic dual-chamber his bundle pacemaker - Dr. Johney Frame   Type 2 diabetes mellitus (HCC)    Past Surgical History:  Procedure Laterality Date   BIOPSY  08/03/2017   Procedure: BIOPSY;  Surgeon: Malissa Hippo, MD;  Location: [facility];  Service: Endoscopy;;  gastric    CARDIAC CATHETERIZATION  06/20/2018   CATARACT EXTRACTION W/ INTRAOCULAR LENS  IMPLANT, BILATERAL Bilateral 2008   COLONOSCOPY N/A 08/03/2017   Procedure: COLONOSCOPY;  Surgeon: Malissa Hippo, MD;  Location: [facility];  Service: Endoscopy;  Laterality: N/A;   ENDARTERECTOMY Right 12/08/2012   Procedure: ENDARTERECTOMY CAROTID;  Surgeon: Larina Earthly, MD;  Location: [facility] OR;  Service:  Vascular;  Laterality: Right;   EP IMPLANTABLE DEVICE N/A 03/02/2016   Procedure: Pacemaker Implant;  Surgeon: Hillis Range, MD;  Location: [facility] INVASIVE CV LAB;  Service: Cardiovascular;  Laterality: N/A;   ESOPHAGOGASTRODUODENOSCOPY N/A 08/03/2017   Procedure: ESOPHAGOGASTRODUODENOSCOPY (EGD);  Surgeon: Malissa Hippo, MD;  Location: [facility];  Service: Endoscopy;  Laterality: N/A;   ESOPHAGOGASTRODUODENOSCOPY (EGD) WITH PROPOFOL N/A 05/11/2019   Procedure: ESOPHAGOGASTRODUODENOSCOPY (EGD) WITH PROPOFOL;  Surgeon: Malissa Hippo, MD;  Location: [facility];  Service: Endoscopy;  Laterality: N/A;  145pm   INSERT / REPLACE / REMOVE PACEMAKER  03/02/2016   LEFT HEART CATH AND CORONARY ANGIOGRAPHY N/A 06/20/2018   Procedure: LEFT HEART CATH AND CORONARY ANGIOGRAPHY;  Surgeon: Bryan Lemma  W, MD;  Location: [facility] INVASIVE CV LAB;  Service: Cardiovascular;  Laterality: N/A;   MEDTRONIC PACEMAKER RV LEAD REMOVAL AND PLACMENT GENREATOR CHANGE OUT, (N/A Chest)  06/19/2020   PACEMAKER LEAD REMOVAL N/A 06/19/2020   Procedure: MEDTRONIC PACEMAKER RV LEAD REMOVAL AND PLACMENT GENREATOR CHANGE OUT,;  Surgeon: Marinus Maw, MD;  Location: [facility] OR;  Service: Cardiovascular;  Laterality: N/A;   PARTIAL COLECTOMY N/A 10/19/2017   Procedure: PARTIAL COLECTOMY;  Surgeon: Franky Macho, MD;  Location: [facility] ORS;  Service: General;  Laterality: N/A;   POLYPECTOMY  08/03/2017   Procedure: POLYPECTOMY;  Surgeon: Malissa Hippo, MD;  Location: [facility];  Service: Endoscopy;;  colon   FAMILY HISTORY Family History  Problem Relation Age of Onset   Cancer Mother        Stomach   Deep vein thrombosis Mother    Diabetes Mother    Hypertension Mother    Heart disease Mother    Hypertension Father    Diabetes Sister    Hyperlipidemia Sister    Hypertension Sister    Heart disease Sister    Diabetes Brother    Hyperlipidemia Brother    Hypertension Brother    SOCIAL HISTORY Social History   Tobacco  Use   Smoking status: Never   Smokeless tobacco: Never  Vaping Use   Vaping status: Never Used  Substance Use Topics   Alcohol use: No    Alcohol/week: 0.0 standard drinks of alcohol   Drug use: No       OPHTHALMIC EXAM:  Base Eye Exam     Visual Acuity (Snellen - Linear)       Right Left   Dist cc 20/30 +2 20/50 -1   Dist ph cc NI NI    Correction: Glasses         Tonometry (Tonopen, 9:13 AM)       Right Left   Pressure 19 17         Pupils       Dark Light Shape React APD   Right 3 2 Round Brisk None   Left 3 2 Round Brisk None         Visual Fields       Left Right    Full Full         Extraocular Movement       Right Left    Full, Ortho Full, Ortho         Neuro/Psych     Oriented x3: Yes         Dilation     Both eyes: 1.0% Mydriacyl, 2.5% Phenylephrine @ 9:14 AM           Slit Lamp and Fundus Exam     External Exam       Right Left   External Normal Normal         Slit Lamp Exam       Right Left   Lids/Lashes Dermatochalasis - upper lid Dermatochalasis - upper lid   Conjunctiva/Sclera Melanosis Melanosis, temporal Pinguecula   Cornea Arcus, Well healed cataract wound, Debris in tear film, 1-2+ Punctate epithelial erosions Arcus, Well healed cataract wound, severe Debris in tear film, 1-2+ Punctate epithelial erosions   Anterior Chamber Deep and clear, trace pigment Deep and clear   Iris Round and dilated, iris tracking to temporal cataract wound Round and dilated   Lens PC IOL in good postion with open PC PC IOL in good postion with open  PC   Anterior Vitreous Vitreous syneresis, Posterior vitreous detachment Vitreous syneresis         Fundus Exam       Right Left   Disc Pink and sharp, PPP Pink and sharp, PPA   C/D Ratio 0.3 0.2   Macula Flat, Blunted foveal reflex, No heme or edema Flat, Blunted foveal reflex, RPE mottling, No heme or edema   Vessels Vascular attenuation, Telangiectasia Vascular  attenuation, Telangiectasia   Periphery Attached, No heme Attached, No heme           Refraction     Wearing Rx       Sphere Cylinder Axis Add   Right -5.50 +3.75 103 +2.50   Left -2.00 +0.75 056 +2.50           IMAGING AND PROCEDURES  Imaging and Procedures for 09/23/2023  OCT, Retina - OU - Both Eyes       Right Eye Quality was good. Central Foveal Thickness: 234. Progression has no prior data. Findings include normal foveal contour, no IRF, no SRF, retinal drusen .   Left Eye Quality was good. Central Foveal Thickness: 248. Progression has no prior data. Findings include normal foveal contour, no IRF, no SRF, retinal drusen (Trace vit opacities).   Notes *Images captured and stored on drive  Diagnosis / Impression:  OD: No DME OS: Trace vit opacities  Clinical management:  See below  Abbreviations: NFP - Normal foveal profile. CME - cystoid macular edema. PED - pigment epithelial detachment. IRF - intraretinal fluid. SRF - subretinal fluid. EZ - ellipsoid zone. ERM - epiretinal membrane. ORA - outer retinal atrophy. ORT - outer retinal tubulation. SRHM - subretinal hyper-reflective material. IRHM - intraretinal hyper-reflective material           ASSESSMENT/PLAN:   ICD-10-CM   1. Diabetes mellitus without complication (HCC)  E11.9 OCT, Retina - OU - Both Eyes    2. Diabetes mellitus treated with oral medication (HCC)  E11.9    Z79.84     3. Encounter for long-term (current) use of insulin (HCC)  Z79.4     4. Hypertensive retinopathy of both eyes  H35.033     5. Essential hypertension  I10     6. Pseudophakia of both eyes  Z96.1     7. Dry eyes, bilateral  H04.123      1-3. Diabetes mellitus, type 2 without retinopathy  -A1c 7.2 (Feb per pt), 7.4 (12.16.21) - OCT shows OD: no DME OU, OS trace vit opacities  - The incidence, risk factors for progression, natural history and treatment options for diabetic retinopathy  were discussed with patient.    - The need for close monitoring of blood glucose, blood pressure, and serum lipids, avoiding cigarette or any type of tobacco, and the need for long term follow up was also discussed with patient. - f/u in 1 year, sooner prn    4,5. Hypertensive retinopathy OU - discussed importance of tight BP control - monitor   6. Pseudophakia OU  - s/p CE/IOL  - IOL in good position, doing well  - monitor   7. Dry eyes OU - recommend artificial tears and lubricating ointment 4-6 times a day - instructed to use hot compresses twice a day  Ophthalmic Meds Ordered this visit:  No orders of the defined types were placed in this encounter.    Return if symptoms worsen or fail to improve.  There are no Patient Instructions on file for this visit.  Explained the diagnoses, plan, and follow up with the patient and they expressed understanding.  Patient expressed understanding of the importance of proper follow up care.   This document serves as a record of services personally performed by Karie Chimera, MD, PhD. It was created on their behalf by Charlette Caffey, COT an ophthalmic technician. The creation of this record is the provider's dictation and/or activities during the visit.    Electronically signed by:  Charlette Caffey, COT  09/25/23 9:15 PM  Karie Chimera, M.D., Ph.D. Diseases & Surgery of the Retina and Vitreous [facility] 09/23/2023  I have reviewed the above documentation for accuracy and completeness, and I agree with the above. Karie Chimera, M.D., Ph.D. 09/25/23 9:15 PM   Abbreviations: M myopia (nearsighted); A astigmatism; H hyperopia (farsighted); P presbyopia; Mrx spectacle prescription;  CTL contact lenses; OD right eye; OS left eye; OU both eyes  XT exotropia; ET esotropia; PEK punctate epithelial keratitis; PEE punctate epithelial erosions; DES dry eye syndrome; MGD meibomian gland dysfunction; ATs artificial tears; PFAT's preservative  free artificial tears; NSC nuclear sclerotic cataract; PSC posterior subcapsular cataract; ERM epi-retinal membrane; PVD posterior vitreous detachment; RD retinal detachment; DM diabetes mellitus; DR diabetic retinopathy; NPDR non-proliferative diabetic retinopathy; PDR proliferative diabetic retinopathy; CSME clinically significant macular edema; DME diabetic macular edema; dbh dot blot hemorrhages; CWS cotton wool spot; POAG primary open angle glaucoma; C/D cup-to-disc ratio; HVF humphrey visual field; GVF goldmann visual field; OCT optical coherence tomography; IOP intraocular pressure; BRVO Branch retinal vein occlusion; CRVO central retinal vein occlusion; CRAO central retinal artery occlusion; BRAO branch retinal artery occlusion; RT retinal tear; SB scleral buckle; PPV pars plana vitrectomy; VH Vitreous hemorrhage; PRP panretinal laser photocoagulation; IVK intravitreal kenalog; VMT vitreomacular traction; MH Macular hole;  NVD neovascularization of the disc; NVE neovascularization elsewhere; AREDS age related eye disease study; ARMD age related macular degeneration; POAG primary open angle glaucoma; EBMD epithelial/anterior basement membrane dystrophy; ACIOL anterior chamber intraocular lens; IOL intraocular lens; PCIOL posterior chamber intraocular lens; Phaco/IOL phacoemulsification with intraocular lens placement; PRK photorefractive keratectomy; LASIK laser assisted in situ keratomileusis; HTN hypertension; DM diabetes mellitus; COPD chronic obstructive pulmonary disease

## 2023-10-02 DIAGNOSIS — E1165 Type 2 diabetes mellitus with hyperglycemia: Secondary | ICD-10-CM | POA: Diagnosis not present

## 2023-10-05 ENCOUNTER — Encounter: Payer: Medicare HMO | Admitting: Internal Medicine

## 2023-10-18 ENCOUNTER — Ambulatory Visit: Payer: Medicare HMO

## 2023-10-18 DIAGNOSIS — I442 Atrioventricular block, complete: Secondary | ICD-10-CM | POA: Diagnosis not present

## 2023-10-18 DIAGNOSIS — J441 Chronic obstructive pulmonary disease with (acute) exacerbation: Secondary | ICD-10-CM | POA: Diagnosis not present

## 2023-10-19 LAB — CUP PACEART REMOTE DEVICE CHECK
Battery Remaining Longevity: 106 mo
Battery Voltage: 3.01 V
Brady Statistic AP VP Percent: 0 %
Brady Statistic AP VS Percent: 0 %
Brady Statistic AS VP Percent: 99.76 %
Brady Statistic AS VS Percent: 0.24 %
Brady Statistic RA Percent Paced: 0 %
Brady Statistic RV Percent Paced: 99.76 %
Date Time Interrogation Session: 20250414214513
Implantable Lead Connection Status: 753985
Implantable Lead Implant Date: 20170829
Implantable Lead Location: 753859
Implantable Lead Model: 5076
Implantable Pulse Generator Implant Date: 20211216
Lead Channel Impedance Value: 285 Ohm
Lead Channel Impedance Value: 380 Ohm
Lead Channel Impedance Value: 418 Ohm
Lead Channel Impedance Value: 589 Ohm
Lead Channel Pacing Threshold Amplitude: 0.75 V
Lead Channel Pacing Threshold Pulse Width: 0.4 ms
Lead Channel Sensing Intrinsic Amplitude: 0.375 mV
Lead Channel Sensing Intrinsic Amplitude: 22.125 mV
Lead Channel Sensing Intrinsic Amplitude: 22.125 mV
Lead Channel Setting Pacing Amplitude: 2.5 V
Lead Channel Setting Pacing Pulse Width: 0.4 ms
Lead Channel Setting Sensing Sensitivity: 1.2 mV
Zone Setting Status: 755011

## 2023-10-20 ENCOUNTER — Encounter: Payer: Self-pay | Admitting: Internal Medicine

## 2023-10-27 ENCOUNTER — Other Ambulatory Visit: Payer: Self-pay | Admitting: Cardiology

## 2023-11-02 DIAGNOSIS — E1165 Type 2 diabetes mellitus with hyperglycemia: Secondary | ICD-10-CM | POA: Diagnosis not present

## 2023-11-17 DIAGNOSIS — J441 Chronic obstructive pulmonary disease with (acute) exacerbation: Secondary | ICD-10-CM | POA: Diagnosis not present

## 2023-12-02 NOTE — Addendum Note (Signed)
 Addended by: Lott Rouleau A on: 12/02/2023 01:13 PM   Modules accepted: Orders

## 2023-12-02 NOTE — Progress Notes (Signed)
 Remote pacemaker transmission.

## 2023-12-03 DIAGNOSIS — E1165 Type 2 diabetes mellitus with hyperglycemia: Secondary | ICD-10-CM | POA: Diagnosis not present

## 2023-12-18 DIAGNOSIS — J441 Chronic obstructive pulmonary disease with (acute) exacerbation: Secondary | ICD-10-CM | POA: Diagnosis not present

## 2024-01-02 DIAGNOSIS — E1165 Type 2 diabetes mellitus with hyperglycemia: Secondary | ICD-10-CM | POA: Diagnosis not present

## 2024-01-11 ENCOUNTER — Encounter: Payer: Self-pay | Admitting: Internal Medicine

## 2024-01-11 ENCOUNTER — Ambulatory Visit: Attending: Internal Medicine | Admitting: Internal Medicine

## 2024-01-11 VITALS — BP 138/68 | HR 72 | Ht 65.0 in | Wt 228.0 lb

## 2024-01-11 DIAGNOSIS — I442 Atrioventricular block, complete: Secondary | ICD-10-CM

## 2024-01-11 NOTE — Patient Instructions (Signed)

## 2024-01-11 NOTE — Progress Notes (Signed)
 HPI Miranda Sexton returns today for ongoing PM followup.  She is a pleasant 83 yo woman with CHB, s/p PPM insertion. She has a his bundle lead position and has been found to have increasing thresholds. She underwent PM gen change out and removal of the old RV lead and insertion of a new RV lead in the left bundle area. In the interim, she notes no chest pain, sob, or fever.  Allergies  Allergen Reactions   Codeine Nausea And Vomiting   Penicillins Rash and Other (See Comments)    Has patient had a PCN reaction causing immediate rash, facial/tongue/throat swelling, SOB or lightheadedness with hypotension: Yes Has patient had a PCN reaction causing severe rash involving mucus membranes or skin necrosis: No Has patient had a PCN reaction that required hospitalization No Has patient had a PCN reaction occurring within the last 10 years: No If all of the above answers are NO, then may proceed with Cephalosporin use.      Current Outpatient Medications  Medication Sig Dispense Refill   acetaminophen  (TYLENOL ) 325 MG tablet Take 325 mg by mouth every 6 (six) hours as needed for mild pain or headache.     albuterol  (VENTOLIN  HFA) 108 (90 Base) MCG/ACT inhaler Inhale 2 puffs into the lungs every 6 (six) hours as needed for wheezing or shortness of breath.      apixaban  (ELIQUIS ) 5 MG TABS tablet TAKE 1 TABLET BY MOUTH TWICE A DAY 60 tablet 5   ascorbic acid (VITAMIN C) 500 MG tablet Take 500 mg by mouth daily.     atorvastatin  (LIPITOR) 40 MG tablet Take 40 mg by mouth daily.     baclofen (LIORESAL) 10 MG tablet Take 10 mg by mouth 2 (two) times daily.     benazepril  (LOTENSIN ) 40 MG tablet Take 40 mg by mouth daily.     doxycycline (VIBRA-TABS) 100 MG tablet Take 100 mg by mouth 2 (two) times daily.     furosemide  (LASIX ) 40 MG tablet Take 80 mg by mouth daily.     glipiZIDE  (GLUCOTROL  XL) 10 MG 24 hr tablet Take 10 mg by mouth daily.      insulin  aspart protamine - aspart (NOVOLOG  70/30)  (70-30) 100 UNIT/ML injection Inject 30-46 Units into the skin See admin instructions. Inject 46 units in the morning & 30 units in the evening.     metoprolol  succinate (TOPROL -XL) 25 MG 24 hr tablet TAKE 1/2 TABLET(12.5 MG) BY MOUTH DAILY 45 tablet 1   moxifloxacin (VIGAMOX) 0.5 % ophthalmic solution Place 1 drop into both eyes in the morning and at bedtime.     nitroGLYCERIN  (NITROSTAT ) 0.4 MG SL tablet Place 1 tablet (0.4 mg total) under the tongue every 5 (five) minutes as needed for chest pain. 30 tablet 2   ONETOUCH VERIO test strip 2 (two) times daily.     potassium chloride  SA (K-DUR) 20 MEQ tablet Take 1 tablet (20 mEq total) by mouth daily. 90 tablet 0   sacubitril-valsartan (ENTRESTO ) 49-51 MG Take 1 tablet by mouth 2 (two) times daily. 60 tablet 11   TRULICITY 0.75 MG/0.5ML SOPN Inject 0.75 mg into the skin every Tuesday.     venlafaxine  XR (EFFEXOR -XR) 150 MG 24 hr capsule Take 150 mg by mouth daily.     vitamin B-12 (CYANOCOBALAMIN) 50 MCG tablet Take 50 mcg by mouth daily.     No current facility-administered medications for this visit.     Past Medical History:  Diagnosis  Date   Anxiety    Asthma    Carotid artery occlusion    Chronic bronchitis (HCC)    Chronic diastolic heart failure (HCC)    CKD (chronic kidney disease) stage 2, GFR 60-89 ml/min    COPD (chronic obstructive pulmonary disease) (HCC)    Coronary atherosclerosis of native coronary artery    Nonobstructive 08/2008   Daily headache    DJD (degenerative joint disease)    Essential hypertension    GERD (gastroesophageal reflux disease)    Hyperlipidemia    Obesity    On home oxygen  therapy    PAD (peripheral artery disease) (HCC)    Moderate right renal artery stenosis 08/2008   Paroxysmal atrial fibrillation (HCC)    Pneumonia    Presence of permanent cardiac pacemaker    Medtronic Advisa DR MRI SureScan model [id number] PPM 03/03/16   Sick sinus syndrome (HCC)    Medtronic dual-chamber his bundle  pacemaker - Dr. Kelsie   Type 2 diabetes mellitus (HCC)     ROS:   All systems reviewed and negative except as noted in the HPI.   Past Surgical History:  Procedure Laterality Date   BIOPSY  08/03/2017   Procedure: BIOPSY;  Surgeon: Golda Claudis PENNER, MD;  Location: [facility];  Service: Endoscopy;;  gastric    CARDIAC CATHETERIZATION  06/20/2018   CATARACT EXTRACTION W/ INTRAOCULAR LENS  IMPLANT, BILATERAL Bilateral 2008   COLONOSCOPY N/A 08/03/2017   Procedure: COLONOSCOPY;  Surgeon: Golda Claudis PENNER, MD;  Location: [facility];  Service: Endoscopy;  Laterality: N/A;   ENDARTERECTOMY Right 12/08/2012   Procedure: ENDARTERECTOMY CAROTID;  Surgeon: Krystal JULIANNA Doing, MD;  Location: [facility] OR;  Service: Vascular;  Laterality: Right;   EP IMPLANTABLE DEVICE N/A 03/02/2016   Procedure: Pacemaker Implant;  Surgeon: Lynwood Kelsie, MD;  Location: [facility] INVASIVE CV LAB;  Service: Cardiovascular;  Laterality: N/A;   ESOPHAGOGASTRODUODENOSCOPY N/A 08/03/2017   Procedure: ESOPHAGOGASTRODUODENOSCOPY (EGD);  Surgeon: Golda Claudis PENNER, MD;  Location: [facility];  Service: Endoscopy;  Laterality: N/A;   ESOPHAGOGASTRODUODENOSCOPY (EGD) WITH PROPOFOL  N/A 05/11/2019   Procedure: ESOPHAGOGASTRODUODENOSCOPY (EGD) WITH PROPOFOL ;  Surgeon: Golda Claudis PENNER, MD;  Location: [facility];  Service: Endoscopy;  Laterality: N/A;  145pm   INSERT / REPLACE / REMOVE PACEMAKER  03/02/2016   LEFT HEART CATH AND CORONARY ANGIOGRAPHY N/A 06/20/2018   Procedure: LEFT HEART CATH AND CORONARY ANGIOGRAPHY;  Surgeon: Anner Alm ORN, MD;  Location: [facility] INVASIVE CV LAB;  Service: Cardiovascular;  Laterality: N/A;   MEDTRONIC PACEMAKER RV LEAD REMOVAL AND PLACMENT GENREATOR CHANGE OUT, (N/A Chest)  06/19/2020   PACEMAKER LEAD REMOVAL N/A 06/19/2020   Procedure: MEDTRONIC PACEMAKER RV LEAD REMOVAL AND PLACMENT GENREATOR CHANGE OUT,;  Surgeon: Waddell Danelle ORN, MD;  Location: [facility] OR;  Service: Cardiovascular;  Laterality: N/A;   PARTIAL  COLECTOMY N/A 10/19/2017   Procedure: PARTIAL COLECTOMY;  Surgeon: Mavis Anes, MD;  Location: [facility];  Service: General;  Laterality: N/A;   POLYPECTOMY  08/03/2017   Procedure: POLYPECTOMY;  Surgeon: Golda Claudis PENNER, MD;  Location: [facility];  Service: Endoscopy;;  colon     Family History  Problem Relation Age of Onset   Cancer Mother        Stomach   Deep vein thrombosis Mother    Diabetes Mother    Hypertension Mother    Heart disease Mother    Hypertension Father    Diabetes Sister    Hyperlipidemia Sister  Hypertension Sister    Heart disease Sister    Diabetes Brother    Hyperlipidemia Brother    Hypertension Brother      Social History   Socioeconomic History   Marital status: Married    Spouse name: Not on file   Number of children: Not on file   Years of education: Not on file   Highest education level: Not on file  Occupational History   Not on file  Tobacco Use   Smoking status: Never   Smokeless tobacco: Never  Vaping Use   Vaping status: Never Used  Substance and Sexual Activity   Alcohol  use: No    Alcohol /week: 0.0 standard drinks of alcohol    Drug use: No   Sexual activity: Not Currently    Birth control/protection: None  Other Topics Concern   Not on file  Social History Narrative   Lives in [place] with spouse.  4 grown children.   Retired but continues to clean houses   Social Drivers of Corporate investment banker Strain: Not on file  Food Insecurity: Not on file  Transportation Needs: Not on file  Physical Activity: Not on file  Stress: No Stress Concern Present (08/17/2022)   Received from [facility] - Occupational Stress Questionnaire    Feeling of Stress : Not at all  Social Connections: Not on file  Intimate Partner Violence: Not At Risk (04/13/2023)   Received from [facility]   Humiliation, Afraid, Rape, and Kick questionnaire    Within the last year, have you been  afraid of your partner or ex-partner?: No    Within the last year, have you been humiliated or emotionally abused in other ways by your partner or ex-partner?: No    Within the last year, have you been kicked, hit, slapped, or otherwise physically hurt by your partner or ex-partner?: No    Within the last year, have you been raped or forced to have any kind of sexual activity by your partner or ex-partner?: No     BP 138/68   Pulse 72   Ht 5' 5 (1.651 m)   Wt 228 lb (103.4 kg)   LMP  (LMP Unknown)   SpO2 95%   BMI 37.94 kg/m   Physical Exam:  overweight appearing NAD HEENT: Unremarkable Neck:  No JVD, no thyromegally Lymphatics:  No adenopathy Back:  No CVA tenderness Lungs:  Clear with no wheezes HEART:  Regular rate rhythm, no murmurs, no rubs, no clicks Abd:  soft, positive bowel sounds, no organomegally, no rebound, no guarding Ext:  2 plus pulses, no edema, no cyanosis, no clubbing Skin:  No rashes no nodules Neuro:  CN II through XII intact, motor grossly intact  DEVICE  Normal device function.  See PaceArt for details.   Assess/Plan:  PM lead dysfunction - her threshold is much improved after undergoing extraction and insertion of a new RV lead in the left bundle area. Her thresholds remain stable. 2. Atrial fib - she remains in atrial fib/flutter. Her rate is controlled.  3. CHB - she has an escape today of 35/min. We will follow. She is asymptomatic with pacing 4. Coags - she has not had bleeding and is back on her systemic anti-coagulation with Eliquis .   Danelle Waddell COME

## 2024-01-13 LAB — CUP PACEART INCLINIC DEVICE CHECK
Date Time Interrogation Session: 20250709202628
Implantable Lead Connection Status: 753985
Implantable Lead Implant Date: 20170829
Implantable Lead Location: 753859
Implantable Lead Model: 5076
Implantable Pulse Generator Implant Date: 20211216

## 2024-01-17 ENCOUNTER — Ambulatory Visit (INDEPENDENT_AMBULATORY_CARE_PROVIDER_SITE_OTHER): Payer: Medicare HMO

## 2024-01-17 DIAGNOSIS — I442 Atrioventricular block, complete: Secondary | ICD-10-CM

## 2024-01-17 DIAGNOSIS — J441 Chronic obstructive pulmonary disease with (acute) exacerbation: Secondary | ICD-10-CM | POA: Diagnosis not present

## 2024-01-17 LAB — CUP PACEART REMOTE DEVICE CHECK
Battery Remaining Longevity: 103 mo
Battery Voltage: 3.01 V
Brady Statistic AP VP Percent: 0 %
Brady Statistic AP VS Percent: 0 %
Brady Statistic AS VP Percent: 98.63 %
Brady Statistic AS VS Percent: 1.37 %
Brady Statistic RA Percent Paced: 0 %
Brady Statistic RV Percent Paced: 98.63 %
Date Time Interrogation Session: 20250715020110
Implantable Lead Connection Status: 753985
Implantable Lead Implant Date: 20170829
Implantable Lead Location: 753859
Implantable Lead Model: 5076
Implantable Pulse Generator Implant Date: 20211216
Lead Channel Impedance Value: 266 Ohm
Lead Channel Impedance Value: 361 Ohm
Lead Channel Impedance Value: 418 Ohm
Lead Channel Impedance Value: 589 Ohm
Lead Channel Pacing Threshold Amplitude: 0.75 V
Lead Channel Pacing Threshold Pulse Width: 0.4 ms
Lead Channel Sensing Intrinsic Amplitude: 0.375 mV
Lead Channel Sensing Intrinsic Amplitude: 25.75 mV
Lead Channel Sensing Intrinsic Amplitude: 26.625 mV
Lead Channel Setting Pacing Amplitude: 2.5 V
Lead Channel Setting Pacing Pulse Width: 0.4 ms
Lead Channel Setting Sensing Sensitivity: 1.2 mV
Zone Setting Status: 755011

## 2024-01-18 ENCOUNTER — Ambulatory Visit: Payer: Self-pay | Admitting: Internal Medicine

## 2024-02-02 DIAGNOSIS — E1165 Type 2 diabetes mellitus with hyperglycemia: Secondary | ICD-10-CM | POA: Diagnosis not present

## 2024-02-17 DIAGNOSIS — J441 Chronic obstructive pulmonary disease with (acute) exacerbation: Secondary | ICD-10-CM | POA: Diagnosis not present

## 2024-03-03 DIAGNOSIS — E1165 Type 2 diabetes mellitus with hyperglycemia: Secondary | ICD-10-CM | POA: Diagnosis not present

## 2024-03-06 ENCOUNTER — Other Ambulatory Visit: Payer: Self-pay | Admitting: Cardiology

## 2024-03-06 DIAGNOSIS — I4819 Other persistent atrial fibrillation: Secondary | ICD-10-CM

## 2024-03-06 NOTE — Telephone Encounter (Signed)
 Prescription refill request for Eliquis  received. Indication:afib Last office visit:7/25 Scr:1.04  11/24 Age: 83 Weight:103.4  kg  Prescription refilled

## 2024-03-19 DIAGNOSIS — J441 Chronic obstructive pulmonary disease with (acute) exacerbation: Secondary | ICD-10-CM | POA: Diagnosis not present

## 2024-04-03 DIAGNOSIS — R5383 Other fatigue: Secondary | ICD-10-CM | POA: Diagnosis not present

## 2024-04-03 DIAGNOSIS — Z79899 Other long term (current) drug therapy: Secondary | ICD-10-CM | POA: Diagnosis not present

## 2024-04-03 DIAGNOSIS — F419 Anxiety disorder, unspecified: Secondary | ICD-10-CM | POA: Diagnosis not present

## 2024-04-06 ENCOUNTER — Emergency Department (HOSPITAL_COMMUNITY)
Admission: EM | Admit: 2024-04-06 | Discharge: 2024-04-06 | Disposition: A | Discharge status: Home / Self Care | Attending: Emergency Medicine | Admitting: Emergency Medicine

## 2024-04-06 ENCOUNTER — Encounter (HOSPITAL_COMMUNITY): Payer: Self-pay | Admitting: Emergency Medicine

## 2024-04-06 ENCOUNTER — Emergency Department (HOSPITAL_COMMUNITY)

## 2024-04-06 ENCOUNTER — Other Ambulatory Visit: Payer: Self-pay

## 2024-04-06 DIAGNOSIS — E1122 Type 2 diabetes mellitus with diabetic chronic kidney disease: Secondary | ICD-10-CM | POA: Insufficient documentation

## 2024-04-06 DIAGNOSIS — Z79899 Other long term (current) drug therapy: Secondary | ICD-10-CM | POA: Insufficient documentation

## 2024-04-06 DIAGNOSIS — Z7901 Long term (current) use of anticoagulants: Secondary | ICD-10-CM | POA: Diagnosis not present

## 2024-04-06 DIAGNOSIS — Z950 Presence of cardiac pacemaker: Secondary | ICD-10-CM | POA: Insufficient documentation

## 2024-04-06 DIAGNOSIS — M7121 Synovial cyst of popliteal space [Baker], right knee: Secondary | ICD-10-CM | POA: Insufficient documentation

## 2024-04-06 DIAGNOSIS — Z7984 Long term (current) use of oral hypoglycemic drugs: Secondary | ICD-10-CM | POA: Diagnosis not present

## 2024-04-06 DIAGNOSIS — R9389 Abnormal findings on diagnostic imaging of other specified body structures: Secondary | ICD-10-CM | POA: Diagnosis not present

## 2024-04-06 DIAGNOSIS — M1711 Unilateral primary osteoarthritis, right knee: Secondary | ICD-10-CM | POA: Insufficient documentation

## 2024-04-06 DIAGNOSIS — E162 Hypoglycemia, unspecified: Secondary | ICD-10-CM

## 2024-04-06 DIAGNOSIS — Z794 Long term (current) use of insulin: Secondary | ICD-10-CM | POA: Diagnosis not present

## 2024-04-06 DIAGNOSIS — I480 Paroxysmal atrial fibrillation: Secondary | ICD-10-CM | POA: Insufficient documentation

## 2024-04-06 DIAGNOSIS — E11649 Type 2 diabetes mellitus with hypoglycemia without coma: Secondary | ICD-10-CM | POA: Insufficient documentation

## 2024-04-06 DIAGNOSIS — N189 Chronic kidney disease, unspecified: Secondary | ICD-10-CM | POA: Diagnosis not present

## 2024-04-06 DIAGNOSIS — M7989 Other specified soft tissue disorders: Secondary | ICD-10-CM | POA: Diagnosis not present

## 2024-04-06 DIAGNOSIS — R0989 Other specified symptoms and signs involving the circulatory and respiratory systems: Secondary | ICD-10-CM | POA: Diagnosis not present

## 2024-04-06 LAB — BASIC METABOLIC PANEL WITH GFR
Anion gap: 9 (ref 5–15)
BUN: 13 mg/dL (ref 8–23)
CO2: 29 mmol/L (ref 22–32)
Calcium: 9.8 mg/dL (ref 8.9–10.3)
Chloride: 102 mmol/L (ref 98–111)
Creatinine, Ser: 1.04 mg/dL — ABNORMAL HIGH (ref 0.44–1.00)
GFR, Estimated: 53 mL/min — ABNORMAL LOW (ref >60)
Glucose, Bld: 62 mg/dL — ABNORMAL LOW (ref 70–99)
Potassium: 4.8 mmol/L (ref 3.5–5.1)
Sodium: 140 mmol/L (ref 135–145)

## 2024-04-06 LAB — CBC
HCT: 41.2 % (ref 36.0–46.0)
Hemoglobin: 13 g/dL (ref 12.0–15.0)
MCH: 30.2 pg (ref 26.0–34.0)
MCHC: 31.6 g/dL (ref 30.0–36.0)
MCV: 95.8 fL (ref 80.0–100.0)
Platelets: 262 K/uL (ref 150–400)
RBC: 4.3 MIL/uL (ref 3.87–5.11)
RDW: 14.2 % (ref 11.5–15.5)
WBC: 4.5 K/uL (ref 4.0–10.5)
nRBC: 0 % (ref 0.0–0.2)

## 2024-04-06 LAB — CBG MONITORING, ED
Glucose-Capillary: 59 mg/dL — ABNORMAL LOW (ref 70–99)
Glucose-Capillary: 151 mg/dL — ABNORMAL HIGH (ref 70–99)

## 2024-04-06 MED ORDER — DEXTROSE 50 % IV SOLN
1.0000 | Freq: Once | INTRAVENOUS | Status: AC
  Administered 2024-04-06: 50 mL via INTRAVENOUS
  Filled 2024-04-06: qty 50

## 2024-04-06 NOTE — ED Provider Notes (Signed)
 [place] EMERGENCY DEPARTMENT AT [facility] Provider Note   CSN: [id number] Arrival date & time: 04/06/24  1318     Patient presents with: Leg Swelling   Miranda Sexton is a 83 y.o. femaleWith a history including, sick sinus syndrome/complete heart block with pacemaker insertion, PAD, GERD, paroxysmal A-fib, chronic kidney disease, type 2 diabetes presenting with an approximate 2-week history of right lower extremity swelling and pain which is worsened with weightbearing although she has been able to tolerate weightbearing since taking Tylenol  especially better after using a topical roll on tylenol  her daughter gave her.  She states her bilateral legs generally will become swollen but usually left is greater than right and her right leg is more swollen than normal.  She has localizing pressure type pain in her medial right upper calf.  Denies pain in her thigh.  Pain is worsened with weightbearing and better at rest.  She has had no other specific treatment prior to arrival.  She denies shortness of breath, orthopnea.  She denies injury.   The history is provided by the patient.       Prior to Admission medications   Medication Sig Start Date End Date Taking? Authorizing Provider  acetaminophen  (TYLENOL ) 325 MG tablet Take 325 mg by mouth every 6 (six) hours as needed for mild pain or headache.    [provider]  albuterol  (VENTOLIN  HFA) 108 (90 Base) MCG/ACT inhaler Inhale 2 puffs into the lungs every 6 (six) hours as needed for wheezing or shortness of breath.     [provider]  ascorbic acid (VITAMIN C) 500 MG tablet Take 500 mg by mouth daily.    [provider]  atorvastatin  (LIPITOR) 40 MG tablet Take 40 mg by mouth daily.    [provider]  baclofen (LIORESAL) 10 MG tablet Take 10 mg by mouth 2 (two) times daily. 01/01/21   [provider]  benazepril  (LOTENSIN ) 40 MG tablet Take 40 mg by mouth daily. 12/16/23   [provider]  doxycycline (VIBRA-TABS) 100 MG tablet Take 100 mg by mouth 2 (two) times daily.    [provider]  ELIQUIS  5 MG TABS tablet TAKE 1 TABLET BY MOUTH TWICE DAILY 03/06/24   Debera Jayson MATSU, MD  furosemide  (LASIX ) 40 MG tablet Take 80 mg by mouth daily.    [provider]  glipiZIDE  (GLUCOTROL  XL) 10 MG 24 hr tablet Take 10 mg by mouth daily.     [provider]  insulin  aspart protamine - aspart (NOVOLOG  70/30) (70-30) 100 UNIT/ML injection Inject 30-46 Units into the skin See admin instructions. Inject 46 units in the morning & 30 units in the evening.    [provider]  metoprolol  succinate (TOPROL -XL) 25 MG 24 hr tablet TAKE 1/2 TABLET(12.5 MG) BY MOUTH DAILY 10/27/23   Debera Jayson MATSU, MD  moxifloxacin (VIGAMOX) 0.5 % ophthalmic solution Place 1 drop into both eyes in the morning and at bedtime.    [provider]  nitroGLYCERIN  (NITROSTAT ) 0.4 MG SL tablet Place 1 tablet (0.4 mg total) under the tongue every 5 (five) minutes as needed for chest pain. 06/21/18   Akula, Vijaya, MD  [facility] VERIO test strip 2 (two) times daily. 03/03/20   [provider]  potassium chloride  SA (K-DUR) 20 MEQ tablet Take 1 tablet (20 mEq total) by mouth daily. 02/05/19   Debera Jayson MATSU, MD  sacubitril-valsartan (ENTRESTO ) 49-51 MG Take 1 tablet by mouth 2 (two) times  daily. 05/28/23   Debera Jayson MATSU, MD  TRULICITY 0.75 MG/0.5ML SOPN Inject 0.75 mg into the skin every Tuesday. 01/21/20   [provider]  venlafaxine  XR (EFFEXOR -XR) 150 MG 24 hr capsule Take 150 mg by mouth daily.    [provider]  vitamin B-12 (CYANOCOBALAMIN) 50 MCG tablet Take 50 mcg by mouth daily.    [provider]    Allergies: Codeine and Penicillins    Review of Systems  Constitutional:  Negative for chills and fever.  HENT:  Negative for congestion.   Eyes: Negative.   Respiratory:  Negative for cough, chest tightness and  shortness of breath.   Cardiovascular:  Positive for leg swelling. Negative for chest pain.  Gastrointestinal:  Negative for abdominal pain, nausea and vomiting.  Genitourinary: Negative.   Musculoskeletal:  Negative for arthralgias, joint swelling and neck pain.  Skin: Negative.  Negative for rash and wound.  Neurological:  Negative for dizziness, weakness, light-headedness, numbness and headaches.  Psychiatric/Behavioral: Negative.      Updated Vital Signs BP (!) 146/53   Pulse 63   Temp 98 F (36.7 C)   Resp 18   Ht 5' 5 (1.651 m)   Wt 103.4 kg   LMP  (LMP Unknown)   SpO2 97%   BMI 37.94 kg/m   Physical Exam Vitals and nursing note reviewed.  Constitutional:      Appearance: She is well-developed.  HENT:     Head: Normocephalic and atraumatic.  Eyes:     Conjunctiva/sclera: Conjunctivae normal.  Cardiovascular:     Rate and Rhythm: Normal rate and regular rhythm.     Heart sounds: Normal heart sounds.  Pulmonary:     Effort: Pulmonary effort is normal.     Breath sounds: Normal breath sounds. No wheezing.  Abdominal:     General: Bowel sounds are normal.     Palpations: Abdomen is soft.     Tenderness: There is no abdominal tenderness.  Musculoskeletal:        General: Swelling and tenderness present. Normal range of motion.     Cervical back: Normal range of motion.     Comments: 2+ edema bilaterally lower extremities but with localizing tenderness to the right upper medial calf.  There is no bruising or other skin changes, no palpable cords.  She is nontender in her right thigh, no knee pain.  Skin:    General: Skin is warm and dry.  Neurological:     Mental Status: She is alert.     (all labs ordered are listed, but only abnormal results are displayed) Labs Reviewed  BASIC METABOLIC PANEL WITH GFR - Abnormal; Notable for the following components:      Result Value   Glucose, Bld 62 (*)    Creatinine, Ser 1.04 (*)    GFR, Estimated 53 (*)    All other  components within normal limits  CBG MONITORING, ED - Abnormal; Notable for the following components:   Glucose-Capillary 59 (*)    All other components within normal limits  CBG MONITORING, ED - Abnormal; Notable for the following components:   Glucose-Capillary 151 (*)    All other components within normal limits  CBC    EKG: None  Radiology: DG Knee Complete 4 Views Right Result Date: 04/06/2024 CLINICAL DATA:  Right leg swelling 1 week. EXAM: RIGHT KNEE - COMPLETE 4+ VIEW COMPARISON:  None Available. FINDINGS: There is diffuse decreased bone mineralization. Mild-to-moderate osteoarthritic change of the right knee. No  definite acute fracture or dislocation. No significant joint effusion. Remainder the exam is unremarkable. IMPRESSION: 1. No acute findings. 2. Mild-to-moderate osteoarthritic change of the right knee. Electronically Signed   By: Toribio Agreste M.D.   On: 04/06/2024 16:41   DG Chest Portable 1 View Result Date: 04/06/2024 CLINICAL DATA:  leg swelling. EXAM: PORTABLE CHEST 1 VIEW COMPARISON:  07/03/2020. FINDINGS: Low lung volume. There is mild to moderate diffuse pulmonary vascular congestion, likely accentuated by low lung volume. Redemonstration of moderately elevated right hemidiaphragm. Bilateral lung fields are otherwise clear. Bilateral costophrenic angles are clear. Stable cardio-mediastinal silhouette. There is a left sided 2-lead pacemaker. No acute osseous abnormalities. The soft tissues are within normal limits. IMPRESSION: Mild-to-moderate pulmonary vascular congestion, likely accentuated by low lung volume. Electronically Signed   By: Ree Molt M.D.   On: 04/06/2024 15:19   US  Venous Img Lower Right (DVT Study) Result Date: 04/06/2024 CLINICAL DATA:  Chronic lower extremity edema worse over the past week EXAM: RIGHT LOWER EXTREMITY VENOUS DOPPLER ULTRASOUND TECHNIQUE: Gray-scale sonography with compression, as well as color and duplex ultrasound, were performed  to evaluate the deep venous system(s) from the level of the common femoral vein through the popliteal and proximal calf veins. COMPARISON:  None Available. FINDINGS: VENOUS Normal compressibility of the common femoral, superficial femoral, and popliteal veins, as well as the visualized calf veins. Visualized portions of profunda femoral vein and great saphenous vein unremarkable. No filling defects to suggest DVT on grayscale or color Doppler imaging. Doppler waveforms show normal direction of venous flow, normal respiratory plasticity and response to augmentation. Limited views of the contralateral common femoral vein are unremarkable. OTHER Complex fluid collection in the popliteal fossa measures grossly 5.1 x 0.8 x 4.3 cm. Findings are most consistent with Baker's cyst. Limitations: none IMPRESSION: 1. No evidence of deep venous thrombosis. 2. Mildly complex Baker's cyst popliteal fossa. Electronically Signed   By: Wilkie Lent M.D.   On: 04/06/2024 14:34     Procedures   Medications Ordered in the ED  dextrose  50 % solution 50 mL (50 mLs Intravenous Given 04/06/24 1629)                                    Medical Decision Making Patient presenting with right upper calf pain, described as a pressure sensation, with concern for possible blood clot.  She does have chronic edema in her bilateral legs, but states the right leg has been worse since she noticed this discomfort.  No injury, no history of DVT.  Ultrasound today is reassuring.  She was given referral to orthopedics, discussed home care including heat, arthritis strength Tylenol .  Amount and/or Complexity of Data Reviewed Labs: ordered.    Details: Labs including a CBC and c-Met were obtained, she has got a normal WBC count at 4.5, there is no exam findings to suggest joint infection and normal WBC count is reassuring.  Her be met was surprising for glucose of 62, she endorsed she had not ate a meal since early this morning.  She was  given p.o. juice and crackers but repeat CBG was 59.  Therefore she was given an IV and an amp of D50, recheck CBG was improved to 151.  She is planning to eat immediately when she leaves here, family at bedside. Radiology: ordered.    Details: Imaging reviewed, degenerative joint disease right knee.  Ultrasound negative for  DVT. Discussion of management or test interpretation with external provider(s): Patient stable at time of discharge.  Risk Decision regarding hospitalization.        Final diagnoses:  Baker's cyst of knee, right  Osteoarthritis of right knee, unspecified osteoarthritis type  Hypoglycemia    ED Discharge Orders     None          Birdena Mliss RIGGERS 04/06/24 1721    Towana Ozell BROCKS, MD 04/09/24 1225

## 2024-04-06 NOTE — ED Notes (Signed)
 Due to the pt's BG being 62, pt was given crackers and orange juice. Provider is aware. Will check pt's BG in 15ish mins

## 2024-04-06 NOTE — ED Triage Notes (Signed)
 Patient coming to ED for evaluation of R leg swelling.  Reports swelling started a week ago.  States I normally have swelling around my ankle, but not all the way up my leg.  C/o pain to leg due to swelling.

## 2024-04-06 NOTE — ED Notes (Signed)
 Pt/family received d/c paperwork at this time. After going over the paperwork any questions, comments, or concerns were answered to the best of this nurse's knowledge. The pt/family verbally acknowledged the teachings/instructions.   Reviewed with daughter Holley

## 2024-04-06 NOTE — Discharge Instructions (Signed)
 You do have significant arthritis in your right knee which is the reason for your pain, along with the Baker's cyst as we discussed.  Elevation and gentle heating pad for 20 minutes 3-4 times daily can help with your discomfort.  Also recommend arthritis strength Tylenol  per label instructions.  Plan to follow-up with orthopedics as recommended.  Also make sure you eat a good meal when you leave here to prevent another drop in your blood glucose.

## 2024-04-11 NOTE — Progress Notes (Signed)
 Remote PPM Transmission

## 2024-04-17 ENCOUNTER — Ambulatory Visit

## 2024-04-17 DIAGNOSIS — I4819 Other persistent atrial fibrillation: Secondary | ICD-10-CM

## 2024-04-18 DIAGNOSIS — J441 Chronic obstructive pulmonary disease with (acute) exacerbation: Secondary | ICD-10-CM | POA: Diagnosis not present

## 2024-04-18 LAB — CUP PACEART REMOTE DEVICE CHECK
Battery Remaining Longevity: 103 mo
Battery Voltage: 3.01 V
Brady Statistic AP VP Percent: 0 %
Brady Statistic AP VS Percent: 0 %
Brady Statistic AS VP Percent: 96.48 %
Brady Statistic AS VS Percent: 3.52 %
Brady Statistic RA Percent Paced: 0 %
Brady Statistic RV Percent Paced: 96.48 %
Date Time Interrogation Session: 20251014020056
Implantable Lead Connection Status: 753985
Implantable Lead Implant Date: 20170829
Implantable Lead Location: 753859
Implantable Lead Model: 5076
Implantable Pulse Generator Implant Date: 20211216
Lead Channel Impedance Value: 266 Ohm
Lead Channel Impedance Value: 361 Ohm
Lead Channel Impedance Value: 437 Ohm
Lead Channel Impedance Value: 646 Ohm
Lead Channel Pacing Threshold Amplitude: 0.75 V
Lead Channel Pacing Threshold Pulse Width: 0.4 ms
Lead Channel Sensing Intrinsic Amplitude: 0.375 mV
Lead Channel Sensing Intrinsic Amplitude: 31.625 mV
Lead Channel Sensing Intrinsic Amplitude: 31.625 mV
Lead Channel Setting Pacing Amplitude: 2.5 V
Lead Channel Setting Pacing Pulse Width: 0.4 ms
Lead Channel Setting Sensing Sensitivity: 1.2 mV
Zone Setting Status: 755011

## 2024-04-19 ENCOUNTER — Ambulatory Visit: Payer: Self-pay | Admitting: Internal Medicine

## 2024-04-19 NOTE — Progress Notes (Signed)
 Remote PPM Transmission

## 2024-04-25 DIAGNOSIS — R109 Unspecified abdominal pain: Secondary | ICD-10-CM | POA: Diagnosis not present

## 2024-04-25 DIAGNOSIS — R918 Other nonspecific abnormal finding of lung field: Secondary | ICD-10-CM | POA: Diagnosis not present

## 2024-04-25 DIAGNOSIS — R1031 Right lower quadrant pain: Secondary | ICD-10-CM | POA: Diagnosis not present

## 2024-05-04 DIAGNOSIS — E1165 Type 2 diabetes mellitus with hyperglycemia: Secondary | ICD-10-CM | POA: Diagnosis not present

## 2024-05-16 DIAGNOSIS — R918 Other nonspecific abnormal finding of lung field: Secondary | ICD-10-CM | POA: Diagnosis not present

## 2024-05-16 DIAGNOSIS — J984 Other disorders of lung: Secondary | ICD-10-CM | POA: Diagnosis not present

## 2024-05-16 DIAGNOSIS — R0602 Shortness of breath: Secondary | ICD-10-CM | POA: Diagnosis not present

## 2024-06-03 DIAGNOSIS — E1165 Type 2 diabetes mellitus with hyperglycemia: Secondary | ICD-10-CM | POA: Diagnosis not present

## 2024-06-06 ENCOUNTER — Other Ambulatory Visit: Payer: Self-pay | Admitting: Cardiology

## 2024-06-18 DIAGNOSIS — J441 Chronic obstructive pulmonary disease with (acute) exacerbation: Secondary | ICD-10-CM | POA: Diagnosis not present

## 2024-06-25 ENCOUNTER — Other Ambulatory Visit: Payer: Self-pay | Admitting: Cardiology

## 2024-07-02 NOTE — Progress Notes (Signed)
 " Cardiology Office Note:  .   Date:  07/10/2024  ID:  Miranda Sexton, DOB [birth year], MRN [id number] PCP: Miranda Leta KATHEE, MD   [facility] Providers Cardiologist:  Jayson Sierras, MD Electrophysiologist:  Danelle Birmingham, MD    History of Present Illness: .   Miranda Sexton is a 83 y.o. female with CHB, s/p PPM insertion. She has a his bundle lead position and has been found to have increasing thresholds. She underwent PM gen change out and removal of the old RV lead and insertion of a new RV lead in the left bundle area. Nonobstructive CAD, Persistent Afib.  Patient comes in with her daughter. She says when she was walking 3 months ago she had a tightness from her upper chest down to her stomach and then her legs wouldn't move. She stopped exercising back then. She wasn't using her oxygen  at the time. She stops and takes deep breaths in/out and it resolves. Wheezing today with walking but O2 sat ok. She doesn't have a portable O2 tank and has 12 full oxygen  tanks in her home. Her legs are swelling. She's been missing her lasix  doses. Her daughter is trying to get her to take extra  ROS:   Studies Reviewed: SABRA         Prior CV Studies:   Echo 05/2019 IMPRESSIONS     1. Left ventricular ejection fraction, by visual estimation, is 65 to  70%. The left ventricle has hyperdynamic function. There is no left  ventricular hypertrophy.   2. Elevated left ventricular end-diastolic pressure.   3. Left ventricular diastolic parameters are indeterminate.   4. The left ventricle has no regional wall motion abnormalities.   5. Global right ventricle has normal systolic function.The right  ventricular size is normal. No increase in right ventricular wall  thickness.   6. Left atrial size was normal.   7. Right atrial size was normal.   8. Mild mitral annular calcification.   9. The mitral valve is degenerative. Moderate mitral valve regurgitation.  10. The tricuspid valve is grossly normal.  Tricuspid valve regurgitation  moderate.  11. The aortic valve is tricuspid. Aortic valve regurgitation is not  visualized. Mild aortic valve stenosis.  12. There is Moderate calcification of the aortic valve.  13. There is Moderate thickening of the aortic valve.  14. The pulmonic valve was grossly normal. Pulmonic valve regurgitation is  trivial.  15. Moderately elevated pulmonary artery systolic pressure.  16. A pacer wire is visualized.  17. The inferior vena cava is normal in size with greater than 50%  respiratory variability, suggesting right atrial pressure of 3 mmHg.   Cath 2019 Prox LAD lesion is 40% stenosed. Otherwise minimal CAD with a left dominant system. The left ventricular systolic function is normal. LV end diastolic pressure is moderately elevated.   SUMMARY: Angiographically normal coronary arteries with only mild to moderate proximal LAD disease. Left dominant system. Normal LVEF with moderately elevated EDP.   RECOMMENDATIONS: Evaluate for other non-anginal cause for chest pain (if cardiac, could consider coronary spasm versus hypertensive urgency) Discharge plans per primary team Okay to restart Eliquis  tonight    Risk Assessment/Calculations:    CHA2DS2-VASc Score = 7   This indicates a 11.2% annual risk of stroke. The patient's score is based upon: CHF History: 1 HTN History: 1 Diabetes History: 1 Stroke History: 0 Vascular Disease History: 1 Age Score: 2 Gender Score: 1         Physical  Exam:   VS:  BP (!) 142/62 (BP Location: Right Arm, Cuff Size: Large)   Pulse 70   Ht 5' 5 (1.651 m)   Wt 237 lb 9.6 oz (107.8 kg)   LMP  (LMP Unknown)   SpO2 100%   BMI 39.54 kg/m    Orhtostatics: No data found. Wt Readings from Last 3 Encounters:  07/10/24 237 lb 9.6 oz (107.8 kg)  04/06/24 228 lb (103.4 kg)  01/11/24 228 lb (103.4 kg)    GEN: Obese, in no acute distress NECK: No JVD; No carotid bruits CARDIAC:  RRR, 2/6 systolic murmur  LSB/RSB RESPIRATORY:  Decreased breath sounds throughout, no wheezing ABDOMEN: Soft, non-tender, non-distended EXTREMITIES:  plus 2 edema; No deformity   ASSESSMENT AND PLAN: .     Persistent Atrial fib -Her rate is controlled. On toprol  and Eliquis   Permanent pacemaker with extraction and insertion of a new RV lead followed by EPS.  Mild nonobstractive CAD-had episode of chest pain down to her stomach 3 months ago while walking outside without her oxygen . She hasn't had a recurrence. Will continue to monitor  Acute on chronic diastolic CHF/HFpEF on entersto, lasix  but not taking her lasix  regularly. Encouraged her to take her lasix  daily and elevate her legs. She is followed with blood work regularly by PCP. Update echo  Carotid disease-due for carotid dopplers.  Mild AS/mod MR on echo 2020. Will update echo        Dispo: f/u Dr. Debera in 6 months or sooner pending testing.  Signed, Olivia Pavy, PA-C   "

## 2024-07-10 ENCOUNTER — Encounter: Payer: Self-pay | Admitting: Physician Assistant

## 2024-07-10 ENCOUNTER — Ambulatory Visit: Attending: Physician Assistant | Admitting: Physician Assistant

## 2024-07-10 VITALS — BP 142/62 | HR 70 | Ht 65.0 in | Wt 237.6 lb

## 2024-07-10 DIAGNOSIS — R609 Edema, unspecified: Secondary | ICD-10-CM

## 2024-07-10 DIAGNOSIS — I2583 Coronary atherosclerosis due to lipid rich plaque: Secondary | ICD-10-CM

## 2024-07-10 DIAGNOSIS — I2510 Atherosclerotic heart disease of native coronary artery without angina pectoris: Secondary | ICD-10-CM

## 2024-07-10 DIAGNOSIS — I6529 Occlusion and stenosis of unspecified carotid artery: Secondary | ICD-10-CM | POA: Diagnosis not present

## 2024-07-10 DIAGNOSIS — Z950 Presence of cardiac pacemaker: Secondary | ICD-10-CM | POA: Diagnosis not present

## 2024-07-10 DIAGNOSIS — I5043 Acute on chronic combined systolic (congestive) and diastolic (congestive) heart failure: Secondary | ICD-10-CM | POA: Diagnosis not present

## 2024-07-10 DIAGNOSIS — I4819 Other persistent atrial fibrillation: Secondary | ICD-10-CM

## 2024-07-10 NOTE — Patient Instructions (Signed)
 Medication Instructions:  Your physician recommends that you continue on your current medications as directed. Please refer to the Current Medication list given to you today.  *If you need a refill on your cardiac medications before your next appointment, please call your pharmacy*  Lab Work: NONE   If you have labs (blood work) drawn today and your tests are completely normal, you will receive your results only by: MyChart Message (if you have MyChart) OR A paper copy in the mail If you have any lab test that is abnormal or we need to change your treatment, we will call you to review the results.  Testing/Procedures: Your physician has requested that you have a carotid duplex. This test is an ultrasound of the carotid arteries in your neck. It looks at blood flow through these arteries that supply the brain with blood. Allow one hour for this exam. There are no restrictions or special instructions.   Your physician has requested that you have an echocardiogram. Echocardiography is a painless test that uses sound waves to create images of your heart. It provides your doctor with information about the size and shape of your heart and how well your hearts chambers and valves are working. This procedure takes approximately one hour. There are no restrictions for this procedure. Please do NOT wear cologne, perfume, aftershave, or lotions (deodorant is allowed). Please arrive 15 minutes prior to your appointment time.  Please note: We ask at that you not bring children with you during ultrasound (echo/ vascular) testing. Due to room size and safety concerns, children are not allowed in the ultrasound rooms during exams. Our front office staff cannot provide observation of children in our lobby area while testing is being conducted. An adult accompanying a patient to their appointment will only be allowed in the ultrasound room at the discretion of the ultrasound technician under special  circumstances. We apologize for any inconvenience.  Follow-Up: At [facility], you and your health needs are our priority.  As part of our continuing mission to provide you with exceptional heart care, our providers are all part of one team.  This team includes your primary Cardiologist (physician) and Advanced Practice Providers or APPs (Physician Assistants and Nurse Practitioners) who all work together to provide you with the care you need, when you need it.  Your next appointment:   6 month(s)  Provider:   You may see Jayson Sierras, MD or one of the following Advanced Practice Providers on your designated Care Team:   Laymon Qua, PA-C  [place], [place] Olivia Pavy, [place]     We recommend signing up for the patient portal called MyChart.  Sign up information is provided on this After Visit Summary.  MyChart is used to connect with patients for Virtual Visits (Telemedicine).  Patients are able to view lab/test results, encounter notes, upcoming appointments, etc.  Non-urgent messages can be sent to your provider as well.   To learn more about what you can do with MyChart, go to [email or web address].   Other Instructions Thank you for choosing [facility]!

## 2024-07-17 ENCOUNTER — Ambulatory Visit

## 2024-07-17 DIAGNOSIS — I4819 Other persistent atrial fibrillation: Secondary | ICD-10-CM | POA: Diagnosis not present

## 2024-07-18 ENCOUNTER — Ambulatory Visit: Payer: Self-pay | Admitting: Student in an Organized Health Care Education/Training Program

## 2024-07-18 LAB — CUP PACEART REMOTE DEVICE CHECK
Battery Remaining Longevity: 99 mo
Battery Voltage: 3.01 V
Brady Statistic AP VP Percent: 0 %
Brady Statistic AP VS Percent: 0 %
Brady Statistic AS VP Percent: 97.26 %
Brady Statistic AS VS Percent: 2.74 %
Brady Statistic RA Percent Paced: 0 %
Brady Statistic RV Percent Paced: 97.26 %
Date Time Interrogation Session: 20260112190553
Implantable Lead Connection Status: 753985
Implantable Lead Implant Date: 20170829
Implantable Lead Location: 753859
Implantable Lead Model: 5076
Implantable Pulse Generator Implant Date: 20211216
Lead Channel Impedance Value: 266 Ohm
Lead Channel Impedance Value: 361 Ohm
Lead Channel Impedance Value: 418 Ohm
Lead Channel Impedance Value: 627 Ohm
Lead Channel Pacing Threshold Amplitude: 0.75 V
Lead Channel Pacing Threshold Pulse Width: 0.4 ms
Lead Channel Sensing Intrinsic Amplitude: 0.375 mV
Lead Channel Sensing Intrinsic Amplitude: 24.625 mV
Lead Channel Sensing Intrinsic Amplitude: 24.625 mV
Lead Channel Setting Pacing Amplitude: 2.5 V
Lead Channel Setting Pacing Pulse Width: 0.4 ms
Lead Channel Setting Sensing Sensitivity: 1.2 mV
Zone Setting Status: 755011

## 2024-07-20 NOTE — Progress Notes (Signed)
 Remote PPM Transmission

## 2024-08-07 ENCOUNTER — Ambulatory Visit (HOSPITAL_COMMUNITY)
Admission: RE | Admit: 2024-08-07 | Discharge: 2024-08-07 | Attending: Physician Assistant | Admitting: Physician Assistant

## 2024-08-07 DIAGNOSIS — I509 Heart failure, unspecified: Secondary | ICD-10-CM | POA: Diagnosis not present

## 2024-08-07 DIAGNOSIS — I5043 Acute on chronic combined systolic (congestive) and diastolic (congestive) heart failure: Secondary | ICD-10-CM

## 2024-08-07 DIAGNOSIS — R609 Edema, unspecified: Secondary | ICD-10-CM

## 2024-08-07 LAB — ECHOCARDIOGRAM COMPLETE
AR max vel: 1.12 cm2
AV Area VTI: 1.13 cm2
AV Area mean vel: 1.23 cm2
AV Mean grad: 21.7 mmHg
AV Peak grad: 39.4 mmHg
Ao pk vel: 3.14 m/s
Area-P 1/2: 4.21 cm2
MV VTI: 4.32 cm2
P 1/2 time: 555 ms
S' Lateral: 2.5 cm

## 2024-08-08 ENCOUNTER — Ambulatory Visit: Payer: Self-pay | Admitting: Student

## 2024-08-13 ENCOUNTER — Ambulatory Visit (HOSPITAL_COMMUNITY)

## 2024-10-16 ENCOUNTER — Ambulatory Visit

## 2025-01-15 ENCOUNTER — Ambulatory Visit

## 2025-04-16 ENCOUNTER — Ambulatory Visit
